# Patient Record
Sex: Male | Born: 1945 | Race: Black or African American | Hispanic: No | Marital: Married | Smoking: Former smoker
Health system: Southern US, Community
[De-identification: ages and names within clinical notes are randomized; demographics above are authoritative.]

## PROBLEM LIST (undated history)

## (undated) DIAGNOSIS — I2510 Atherosclerotic heart disease of native coronary artery without angina pectoris: Secondary | ICD-10-CM

## (undated) DIAGNOSIS — I10 Essential (primary) hypertension: Secondary | ICD-10-CM

## (undated) DIAGNOSIS — I719 Aortic aneurysm of unspecified site, without rupture: Secondary | ICD-10-CM

## (undated) DIAGNOSIS — E119 Type 2 diabetes mellitus without complications: Secondary | ICD-10-CM

## (undated) DIAGNOSIS — I219 Acute myocardial infarction, unspecified: Secondary | ICD-10-CM

---

## 2001-06-13 ENCOUNTER — Inpatient Hospital Stay (HOSPITAL_COMMUNITY): Admission: EM | Admit: 2001-06-13 | Discharge: 2001-06-15 | Payer: Self-pay | Admitting: Emergency Medicine

## 2001-06-13 ENCOUNTER — Encounter: Payer: Self-pay | Admitting: Cardiovascular Disease

## 2001-06-14 ENCOUNTER — Encounter: Payer: Self-pay | Admitting: Cardiovascular Disease

## 2001-06-15 ENCOUNTER — Encounter (INDEPENDENT_AMBULATORY_CARE_PROVIDER_SITE_OTHER): Payer: Self-pay | Admitting: Cardiovascular Disease

## 2001-06-16 ENCOUNTER — Ambulatory Visit (HOSPITAL_COMMUNITY): Admission: RE | Admit: 2001-06-16 | Discharge: 2001-06-16 | Payer: Self-pay | Admitting: Cardiovascular Disease

## 2006-05-31 ENCOUNTER — Inpatient Hospital Stay (HOSPITAL_BASED_OUTPATIENT_CLINIC_OR_DEPARTMENT_OTHER): Admission: RE | Admit: 2006-05-31 | Discharge: 2006-05-31 | Payer: Self-pay | Admitting: Cardiology

## 2010-05-26 NOTE — Cardiovascular Report (Signed)
Jesse Le, Jesse Le                 ACCOUNT NO.:  [id number]   MEDICAL RECORD NO.:  [id number]          PATIENT TYPE:  OIB   LOCATION:  1965                         FACILITY:  [facility]   PHYSICIAN:  Jesse Le, M.D. DATE OF BIRTH:  Nov 13, 1945   DATE OF PROCEDURE:  05/31/2006  DATE OF DISCHARGE:                            CARDIAC CATHETERIZATION   PROCEDURE:  Left cardiac catheterization with selective left and right  coronary angiography, left ventriculography via right groin using  Judkins technique.   INDICATIONS FOR THE PROCEDURE:  Jesse Le is a 65 year old black male  with past medical history significant for coronary artery disease,  hypertension, non-insulin-dependent diabetes mellitus,  hypercholesteremia, morbid obesity, tobacco abuse and positive family  history of coronary artery disease, who complains of retrosternal chest  pain associated with minimal exertion, relieved with rest, lasting 15-20  minutes.  Denies any nausea, vomiting or diaphoresis.  Denies shortness  of breath.  Denies palpitation, lightheadedness or syncope.  Denies PND,  orthopnea or leg swelling.  Denies rest or nocturnal angina and denies  exertional chest pain to food, breathing or movement.   PAST MEDICAL HISTORY:  As above.   PAST SURGICAL HISTORY:  None.   ALLERGIES:  NO KNOWN DRUG ALLERGIES.   MEDICATIONS AT HOME:  1. Toprol-XL 50 mg p.o. daily.  2. Lotrel 5/20 p.o. daily.  3. Enteric-coated aspirin 81 mg p.o. daily.  4. Actos 15 mg p.o. daily.  5. Nitrostat sublingual p.r.n.   SOCIAL HISTORY:  He is single, smoked 2 pack per day for 20+ years, now  smokes less than a half a pack per day, drinks beer socially.  He worked  for a moving company in the past, born and lives in [place].   FAMILY HISTORY:  Father died of cancer.  Mother died of heart problems;  she was hypertensive.   EXAMINATION:  GENERAL:  He is alert, awake and oriented x3 in no  distress.  VITAL SIGNS:  Blood  pressure was 160/104, pulse of 80.  HEENT:  Conjunctivae were pink.  NECK:  Supple.  No JVD, no bruit.  LUNGS:  Clear to auscultation without rhonchi or rales.  CARDIOVASCULAR:  S1 and S2 were normal.  There is a soft systolic murmur  and S3 gallop.  ABDOMEN:  Soft.  Bowel sounds are present.  Obese, nontender.  EXTREMITIES:  There is no clubbing, cyanosis or edema.   IMPRESSION:  1. New-onset angina.  2. Uncontrolled hypertension secondary to noncompliance.  3. Non-insulin-dependent diabetes mellitus.  4. Hypercholesteremia.  5. Morbid obesity.  6. Tobacco abuse.  7. Positive family history of coronary artery disease.   INFORMED CONSENT:  I discussed with the patient regarding noninvasive  stress testing versus left cath, its risks and benefits, i.e., death, MI  and stroke, need for emergency CABG, local vascular complications, etc,  and consented for the procedure.   PROCEDURE:  After obtaining informed consent, the patient was brought to  the cath lab and was placed on fluoroscopy table.  Right groin was  prepped and draped in the usual fashion.  2% Xylocaine was  used for  local anesthesia in the right groin.  With the help of a thin-wall  needle, a 4-French arterial sheath was placed.  Sheath was aspirated and  flushed.  Next, a 4-French left Judkins catheter was advanced over the  wire under fluoroscopic guidance up to the ascending aorta.  Wire was  pulled.  The catheter was aspirated and connected to the manifold.  The  catheter was further advanced and engaged into left coronary ostium.  Multiple views of the left system were taken.  Next, the catheter was  disengaged and was pulled out over the wire and was replaced with a 4-  [place] right Judkins catheter, which was advanced over the wire under  fluoroscopic guidance up to the ascending aorta.  The wire was pulled  out.  The catheter was aspirated and connected to the manifold.  Catheter was further advanced and engaged  into right coronary ostium.  Multiple views of the right system were taken.  Next, the catheter was  disengaged and was pulled out over the wire and replaced with a 4-French  pigtail catheter, which was advanced over the wire under fluoroscopic  guidance to the ascending aorta.  Wire was pulled out.  The catheter was  aspirated and connected to the manifold.  Catheter was further advanced  across aortic valve into the LV.  LV pressures were recorded.  Next,  left ventriculography was done in 30-degree RAO position.  Post  angiographic pressures were recorded from LV and then pullback pressures  were recorded from the aorta.  There was no gradient across the aortic  valve.  Next, a pigtail catheter was pulled out over the wire.  Sheaths  were aspirated and flushed.   FINDINGS:  LV showed good LV systolic function, LVH, EF of approximately  50%.  Left main was large, which was patent.  LAD had 10% to 15%  proximal and 30% to 40% mid stenosis.  Diagonal #1 to #3 were was small,  which was patent.  Ramus was moderate sized, which was patent.  Left  circumflex had 10% to 15% proximal stenosis.  OM-1 was very small, which  was patent.  OM-2 was small, which was patent.  RCA has 10% to 15%  proximal and mid stenosis.  PDA and PLV branches were small.  The  patient tolerated the procedure well.  There were no complications.  The  patient was transferred to recovery room in stable condition.           ______________________________  Jesse Le Sharyn Le, M.D.     MNH/MEDQ  D:  05/31/2006  T:  05/31/2006  Job:  [id number]   cc:   [facility]

## 2010-05-29 NOTE — Cardiovascular Report (Signed)
[place]  Patient:    Jesse Le, ALESI Visit Number: [id number] MRN: [id number]          Service Type: CAT Location: [facility] 2899 30 Attending Physician:  Ricki Rodriguez Dictated by:   Ricki Rodriguez, M.D. Proc. Date: 06/16/01 Admit Date:  06/16/2001 Discharge Date: 06/16/2001   CC:         Cath Lab   Cardiac Catheterization  PROCEDURE: 1. Left heart catheterization. 2. Selective coronary angiography. 3. Left ventriculogram study.  INDICATION:  This is a 65 year old black male with diabetes, hypertension, and obesity, had recurrent chest pain with minimal abnormality on nuclear stress test at the base of the heart.  APPROACH:  The right femoral artery using 6 French diagnostic catheters.  JL5 was required for left coronary injection.  COMPLICATIONS:  None.  HEMODYNAMIC DATA:  The left ventricular pressure was 176/16, and the aortic pressure was 176/101.  Originally, the aortic pressure was 204/119, 2.5 mg of Vasotec, 5 mg of Lopressor, and 20 mg of labetalol IV x 2 were used to control the blood pressure.  CORONARY ANATOMY:  Left main coronary artery:  The left main coronary artery was short and ectatic.  Left anterior descending coronary artery:  The left anterior descending coronary artery was also ectatic and had minimal proximal disease.  Left circumflex coronary artery:  The left circumflex coronary artery was unremarkable with a large ramus branch.  Right coronary artery:  The right coronary artery was also ectatic and had minimal proximal disease.  Left ventriculogram:  The left ventriculogram showed marginal hypokinesia with ejection fraction of 50-55%.  Descending aortography:  The descending aortography was essentially unremarkable with large renal arteries.  IMPRESSION: 1. Mild coronary artery disease ______, two-vessel disease. 2. Hypertension. 3. Mild left ventricular systolic dysfunction.  RECOMMENDATION:   This patient will be treated medically with emphasis on hypertension, diabetes, and weight control. Dictated by:   Ricki Rodriguez, M.D. Attending Physician:  Ricki Rodriguez DD:  06/16/01 TD:  06/19/01 Job: [id number]

## 2010-05-29 NOTE — Discharge Summary (Signed)
Jesse Le, Jesse Le                             ACCOUNT NO.:  [id number]   MEDICAL RECORD NO.:  [id number]                   PATIENT TYPE:  INP   LOCATION:  3712                                 FACILITY:  [facility]   PHYSICIAN:  Ricki Rodriguez, M.D.               DATE OF BIRTH:  Oct 26, 1945   DATE OF ADMISSION:  06/13/2001  DATE OF DISCHARGE:  06/15/2001                                 DISCHARGE SUMMARY   PRINCIPAL DIAGNOSES:  1. Angina, nonspecific.  2. Diabetes mellitus, type 2.  3. Hypertension.  4. Tobacco abuse disorder.  5. Anxiety.  6. Reflux esophagitis.  7. Obesity.   DISCHARGE MEDICATIONS:  1. Aspirin 81 mg one p.o. daily.  2. Actos 15 mg one daily.  3. Clonidine 0.2 mg three times daily.  4. Norvasc 10 mg one daily.  5. Pepcid AC twice daily.  6. Vicodin 5/500 mg one four times daily as needed.  7. Amoxicillin 500 mg three times daily.  8. Nitroglycerin 0.4 mg one under the tongue every five minutes x3 as needed     for chest pain.  9. Xanax 0.5 mg one twice daily.   PAIN MANAGEMENT:  Patient to use Vicodin as needed.   DISCHARGE ACTIVITY:  As tolerated.   DISCHARGE DIET:  An 1800-calorie, diabetic, low-fat, low-salt diet.   FOLLOWUP:  Followup by Dr. Ricki Rodriguez in two weeks; patient to call [phone]  [phone].   HISTORY:  This 65 year old black male complained of substernal burning chest  pain along with some pain in the abdomen, positive sweating and hypertensive  on arrival, with a history of smoking, alcohol intake and family history of  premature coronary artery disease.   PHYSICAL EXAMINATION:  Temperature 96.8, pulse 66, respirations 18, blood  pressure 74/49 on arrival, subsequent blood pressure 133/66.  Height 6 feet  1 inch and weight 267 pounds.  The patient was alert and oriented x3.  HEENT:  Head normocephalic, atraumatic.  Eyes brown.  Extraocular movement  intact.  Conjunctivae pink.  Ears, nose and throat:  Oral mucous membranes  pink and moist.   NECK:  No JVD.  LUNGS:  Lungs clear bilaterally.  HEART:  Normal S1 and S2.  ABDOMEN:  Abdomen soft with epigastric tenderness.  EXTREMITIES:  No edema.  CNS:  Cranial nerves II-XII grossly intact.   LABORATORY DATA:  Normal electrolytes, BUN and creatinine.  Sugar elevated  at 212.  Normal liver enzymes.  Normal CK-MB and troponin I.  Hemoglobin was  15.2, hematocrit was 43.6, normal WBC count, slightly low platelet count of  119,000.   X-ray of abdomen:  Nonobstructive bowel gas pattern.   X-ray of the chest:  Cardiomegaly, otherwise, unremarkable.   EKG showed sinus rhythm with left ventricular enlargement and nonspecific T  wave changes.   HOSPITAL COURSE:  The patient was admitted to telemetry unit.  Myocardial  infarction  was ruled out.  He received IV Protonix, Phenergan and clear  liquids.  His condition stabilized in 24 hours.  He underwent nuclear stress  test that failed to show any reversible ischemia.  He was watched an  additional 24 hours.  His Altace was discontinued due to symptoms of cough  and he got a dietary consult.  On June 15, 2001, he was discharged home in  satisfactory condition with followup by me in two weeks.                                               Ricki Rodriguez, M.D.    ASK/MEDQ  D:  10/07/2001  T:  10/10/2001  Job:  [id number]

## 2011-10-09 ENCOUNTER — Inpatient Hospital Stay (HOSPITAL_COMMUNITY)
Admission: EM | Admit: 2011-10-09 | Discharge: 2011-10-21 | DRG: 219 | Disposition: A | Discharge status: Home / Self Care | Payer: Medicare Other | Attending: Cardiothoracic Surgery | Admitting: Cardiothoracic Surgery

## 2011-10-09 ENCOUNTER — Encounter (HOSPITAL_COMMUNITY): Payer: Self-pay | Admitting: *Deleted

## 2011-10-09 ENCOUNTER — Emergency Department (HOSPITAL_COMMUNITY): Payer: Medicare Other

## 2011-10-09 DIAGNOSIS — D62 Acute posthemorrhagic anemia: Secondary | ICD-10-CM | POA: Diagnosis present

## 2011-10-09 DIAGNOSIS — R042 Hemoptysis: Secondary | ICD-10-CM

## 2011-10-09 DIAGNOSIS — J449 Chronic obstructive pulmonary disease, unspecified: Secondary | ICD-10-CM | POA: Diagnosis present

## 2011-10-09 DIAGNOSIS — I2510 Atherosclerotic heart disease of native coronary artery without angina pectoris: Secondary | ICD-10-CM | POA: Diagnosis present

## 2011-10-09 DIAGNOSIS — E871 Hypo-osmolality and hyponatremia: Secondary | ICD-10-CM | POA: Diagnosis present

## 2011-10-09 DIAGNOSIS — Z23 Encounter for immunization: Secondary | ICD-10-CM

## 2011-10-09 DIAGNOSIS — I716 Thoracoabdominal aortic aneurysm, without rupture: Secondary | ICD-10-CM

## 2011-10-09 DIAGNOSIS — N289 Disorder of kidney and ureter, unspecified: Secondary | ICD-10-CM | POA: Diagnosis not present

## 2011-10-09 DIAGNOSIS — Z66 Do not resuscitate: Secondary | ICD-10-CM | POA: Diagnosis not present

## 2011-10-09 DIAGNOSIS — I7101 Dissection of thoracic aorta: Secondary | ICD-10-CM

## 2011-10-09 DIAGNOSIS — I219 Acute myocardial infarction, unspecified: Secondary | ICD-10-CM

## 2011-10-09 DIAGNOSIS — I10 Essential (primary) hypertension: Secondary | ICD-10-CM | POA: Diagnosis present

## 2011-10-09 DIAGNOSIS — N2889 Other specified disorders of kidney and ureter: Secondary | ICD-10-CM | POA: Diagnosis not present

## 2011-10-09 DIAGNOSIS — D72829 Elevated white blood cell count, unspecified: Secondary | ICD-10-CM | POA: Diagnosis not present

## 2011-10-09 DIAGNOSIS — J4489 Other specified chronic obstructive pulmonary disease: Secondary | ICD-10-CM | POA: Diagnosis present

## 2011-10-09 DIAGNOSIS — F17200 Nicotine dependence, unspecified, uncomplicated: Secondary | ICD-10-CM | POA: Diagnosis present

## 2011-10-09 DIAGNOSIS — J860 Pyothorax with fistula: Secondary | ICD-10-CM | POA: Diagnosis present

## 2011-10-09 DIAGNOSIS — I498 Other specified cardiac arrhythmias: Secondary | ICD-10-CM | POA: Diagnosis not present

## 2011-10-09 DIAGNOSIS — I7103 Dissection of thoracoabdominal aorta: Principal | ICD-10-CM | POA: Diagnosis present

## 2011-10-09 DIAGNOSIS — I252 Old myocardial infarction: Secondary | ICD-10-CM

## 2011-10-09 HISTORY — DX: Acute myocardial infarction, unspecified: I21.9

## 2011-10-09 HISTORY — DX: Atherosclerotic heart disease of native coronary artery without angina pectoris: I25.10

## 2011-10-09 HISTORY — DX: Essential (primary) hypertension: I10

## 2011-10-09 LAB — COMPREHENSIVE METABOLIC PANEL
ALT: 9 U/L (ref 0–53)
AST: 17 U/L (ref 0–37)
Albumin: 2.6 g/dL — ABNORMAL LOW (ref 3.5–5.2)
Alkaline Phosphatase: 54 U/L (ref 39–117)
BUN: 6 mg/dL (ref 6–23)
CO2: 29 mEq/L (ref 19–32)
Calcium: 9.1 mg/dL (ref 8.4–10.5)
Chloride: 93 mEq/L — ABNORMAL LOW (ref 96–112)
Creatinine, Ser: 0.9 mg/dL (ref 0.50–1.35)
GFR calc Af Amer: >90 mL/min (ref >90)
GFR calc non Af Amer: 87 mL/min — ABNORMAL LOW (ref >90)
Glucose, Bld: 84 mg/dL (ref 70–99)
Potassium: 4 mEq/L (ref 3.5–5.1)
Sodium: 132 mEq/L — ABNORMAL LOW (ref 135–145)
Total Bilirubin: 0.7 mg/dL (ref 0.3–1.2)
Total Protein: 8.4 g/dL — ABNORMAL HIGH (ref 6.0–8.3)

## 2011-10-09 LAB — TYPE AND SCREEN
ABO/RH(D): B POS
Antibody Screen: NEGATIVE

## 2011-10-09 LAB — PROTIME-INR
INR: 1.31 (ref 0.00–1.49)
Prothrombin Time: 16 seconds — ABNORMAL HIGH (ref 11.6–15.2)

## 2011-10-09 LAB — CBC WITH DIFFERENTIAL/PLATELET
Basophils Absolute: 0 10*3/uL (ref 0.0–0.1)
Eosinophils Absolute: 0.1 10*3/uL (ref 0.0–0.7)
Eosinophils Relative: 2 % (ref 0–5)
HCT: 34.3 % — ABNORMAL LOW (ref 39.0–52.0)
Lymphocytes Relative: 20 % (ref 12–46)
MCH: 26.6 pg (ref 26.0–34.0)
MCV: 82.3 fL (ref 78.0–100.0)
Monocytes Absolute: 0.5 10*3/uL (ref 0.1–1.0)
RDW: 14.5 % (ref 11.5–15.5)
WBC: 7.5 10*3/uL (ref 4.0–10.5)

## 2011-10-09 LAB — CBC
HCT: 35 % — ABNORMAL LOW (ref 39.0–52.0)
Hemoglobin: 11.1 g/dL — ABNORMAL LOW (ref 13.0–17.0)
MCH: 26.4 pg (ref 26.0–34.0)
MCHC: 31.7 g/dL (ref 30.0–36.0)
MCV: 83.1 fL (ref 78.0–100.0)
Platelets: 251 10*3/uL (ref 150–400)
RBC: 4.21 MIL/uL — ABNORMAL LOW (ref 4.22–5.81)
RDW: 14.6 % (ref 11.5–15.5)
WBC: 9.7 10*3/uL (ref 4.0–10.5)

## 2011-10-09 LAB — BASIC METABOLIC PANEL
BUN: 5 mg/dL — ABNORMAL LOW (ref 6–23)
CO2: 27 mEq/L (ref 19–32)
Chloride: 95 mEq/L — ABNORMAL LOW (ref 96–112)
Creatinine, Ser: 0.88 mg/dL (ref 0.50–1.35)
Glucose, Bld: 101 mg/dL — ABNORMAL HIGH (ref 70–99)

## 2011-10-09 LAB — ABO/RH: ABO/RH(D): B POS

## 2011-10-09 MED ORDER — SODIUM CHLORIDE 0.9 % IJ SOLN
3.0000 mL | Freq: Two times a day (BID) | INTRAMUSCULAR | Status: DC
  Administered 2011-10-09 – 2011-10-21 (×20): 3 mL via INTRAVENOUS

## 2011-10-09 MED ORDER — LABETALOL HCL 5 MG/ML IV SOLN: 10.0000 mg | INTRAVENOUS | Status: DC | PRN

## 2011-10-09 MED ORDER — ONDANSETRON HCL 4 MG/2ML IJ SOLN: 4.0000 mg | Freq: Four times a day (QID) | INTRAMUSCULAR | Status: DC | PRN

## 2011-10-09 MED ORDER — BUDESONIDE-FORMOTEROL FUMARATE 160-4.5 MCG/ACT IN AERO
2.0000 | INHALATION_SPRAY | Freq: Two times a day (BID) | RESPIRATORY_TRACT | Status: DC
  Administered 2011-10-11 – 2011-10-21 (×19): 2 via RESPIRATORY_TRACT
  Filled 2011-10-09 (×4): qty 6

## 2011-10-09 MED ORDER — NITROGLYCERIN 0.4 MG/HR TD PT24
0.4000 mg | MEDICATED_PATCH | Freq: Every day | TRANSDERMAL | Status: DC
  Administered 2011-10-09 – 2011-10-21 (×13): 0.4 mg via TRANSDERMAL
  Filled 2011-10-09 (×13): qty 1

## 2011-10-09 MED ORDER — DOCUSATE SODIUM 100 MG PO CAPS
100.0000 mg | ORAL_CAPSULE | Freq: Two times a day (BID) | ORAL | Status: DC
  Administered 2011-10-09 – 2011-10-21 (×23): 100 mg via ORAL
  Filled 2011-10-09 (×23): qty 1

## 2011-10-09 MED ORDER — HYDROCODONE-ACETAMINOPHEN 5-325 MG PO TABS
1.0000 | ORAL_TABLET | ORAL | Status: DC | PRN
  Administered 2011-10-14 – 2011-10-17 (×2): 2 via ORAL
  Administered 2011-10-18 (×2): 1 via ORAL
  Administered 2011-10-19: 2 via ORAL
  Administered 2011-10-19 – 2011-10-20 (×3): 1 via ORAL
  Filled 2011-10-09 (×2): qty 2
  Filled 2011-10-09 (×5): qty 1
  Filled 2011-10-09: qty 2

## 2011-10-09 MED ORDER — SODIUM CHLORIDE 0.9 % IV SOLN
INTRAVENOUS | Status: DC
  Administered 2011-10-09: 10:00:00 via INTRAVENOUS

## 2011-10-09 MED ORDER — IOHEXOL 300 MG/ML  SOLN
80.0000 mL | Freq: Once | INTRAMUSCULAR | Status: AC | PRN
  Administered 2011-10-09: 80 mL via INTRAVENOUS

## 2011-10-09 MED ORDER — LISINOPRIL 40 MG PO TABS
40.0000 mg | ORAL_TABLET | Freq: Two times a day (BID) | ORAL | Status: DC
  Administered 2011-10-09 – 2011-10-13 (×9): 40 mg via ORAL
  Filled 2011-10-09 (×11): qty 2

## 2011-10-09 MED ORDER — METOPROLOL TARTRATE 25 MG PO TABS
50.0000 mg | ORAL_TABLET | Freq: Two times a day (BID) | ORAL | Status: DC
  Administered 2011-10-09 – 2011-10-11 (×4): 50 mg via ORAL
  Filled 2011-10-09 (×5): qty 2

## 2011-10-09 MED ORDER — METOPROLOL TARTRATE 1 MG/ML IV SOLN
5.0000 mg | Freq: Once | INTRAVENOUS | Status: AC
  Administered 2011-10-09: 5 mg via INTRAVENOUS
  Filled 2011-10-09: qty 5

## 2011-10-09 MED ORDER — HYDROCOD POLST-CHLORPHEN POLST 10-8 MG/5ML PO LQCR
5.0000 mL | Freq: Two times a day (BID) | ORAL | Status: DC
  Administered 2011-10-09 – 2011-10-16 (×13): 5 mL via ORAL
  Filled 2011-10-09 (×17): qty 5

## 2011-10-09 MED ORDER — KCL IN DEXTROSE-NACL 20-5-0.45 MEQ/L-%-% IV SOLN
INTRAVENOUS | Status: DC
  Administered 2011-10-09 – 2011-10-11 (×2): via INTRAVENOUS
  Administered 2011-10-13: 30 mL/h via INTRAVENOUS
  Filled 2011-10-09 (×7): qty 1000

## 2011-10-09 MED ORDER — SIMVASTATIN 20 MG PO TABS
20.0000 mg | ORAL_TABLET | Freq: Every evening | ORAL | Status: DC
  Administered 2011-10-09 – 2011-10-20 (×11): 20 mg via ORAL
  Filled 2011-10-09 (×13): qty 1

## 2011-10-09 MED ORDER — NITROGLYCERIN 0.4 MG/HR TD PT24
0.4000 mg | MEDICATED_PATCH | Freq: Every day | TRANSDERMAL | Status: DC
  Filled 2011-10-09: qty 1

## 2011-10-09 MED ORDER — ONDANSETRON HCL 4 MG PO TABS: 4.0000 mg | ORAL_TABLET | Freq: Four times a day (QID) | ORAL | Status: DC | PRN

## 2011-10-09 NOTE — Consult Note (Signed)
[address]            [place] [address]          [phone]       Brayland Nelles [facility] Medical Record #[id number] Date of Birth: [birth year]  Referring: No ref. provider found Primary Care: Robynn Pane, MD  Chief Complaint:    Chief Complaint  Patient presents with  . Hemoptysis    History of Present Illness:     66 year old Afro-American male smoker with a history of alcohol abuse presents with a one-day history of dark blood hemoptysis. No history of trauma. No history of chest pain. Patient is a heavy smoker. No prior history of hemoptysis. No history of bleeding disorder blood per rectum or upper GI bleeding. Chest x-ray done in the ED demonstrates a large left thoracic mass. 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 This appears to be a type B dissection with a false lumen enlarged up to 11 cm but mainly thrombosed. There is some extrinsic distortion of the esophagus.  The diameter of the aorta at the diaphragmatic hiatus is 5.8 cm.  The patient denies any specific events of severe chest pain. He denies shortness of breath.   The hemoptysis is dark blood and has been mild. Current Activity/ Functional Status: Sedentary, disabled drinks hard liquor regularly   Past Medical History  Diagnosis Date  . Coronary artery disease   . Hypertension   . Myocardial infarct     History reviewed. No pertinent past surgical history.  History  Smoking status  . Current Every Day Smoker -- 0.5 packs/day  . Types: Cigarettes  Smokeless tobacco  . Not on file    History  Alcohol Use  . Yes    weekends    History   Social History  . Marital Status: Married    Spouse Name: N/A    Number of Children: N/A  . Years of Education: N/A   Occupational History  . Not on file.   Social History Main Topics  . Smoking status:  Current Every Day Smoker -- 0.5 packs/day    Types: Cigarettes  . Smokeless tobacco: Not on file  . Alcohol Use: Yes     weekends  . Drug Use: No  . Sexually Active:    Other Topics Concern  . Not on file   Social History Narrative  . No narrative on file    No Known Allergies  Current Facility-Administered Medications  Medication Dose Route Frequency Provider Last Rate Last Dose  . chlorpheniramine-HYDROcodone (TUSSIONEX) 10-8 MG/5ML suspension 5 mL  5 mL Oral BID Kerin Perna, MD      . dextrose 5 % and 0.45 % NaCl with KCl 20 mEq/L infusion   Intravenous Continuous Ricki Rodriguez, MD      . docusate sodium (COLACE) capsule 100 mg  100 mg Oral BID Ricki Rodriguez, MD      . HYDROcodone-acetaminophen (NORCO/VICODIN) 5-325 MG per tablet 1-2 tablet  1-2 tablet Oral Q4H PRN Ricki Rodriguez, MD      . iohexol (OMNIPAQUE) 300 MG/ML solution 80 mL  80 mL Intravenous Once PRN Medication Radiologist, MD   80 mL at 10/09/11 1550  . labetalol (NORMODYNE,TRANDATE) injection 10 mg  10 mg Intravenous Q2H PRN Kerin Perna, MD      .  lisinopril (PRINIVIL,ZESTRIL) tablet 40 mg  40 mg Oral BID Ricki Rodriguez, MD   40 mg at 10/09/11 1833  . metoprolol (LOPRESSOR) injection 5 mg  5 mg Intravenous Once Ricki Rodriguez, MD   5 mg at 10/09/11 1730  . metoprolol tartrate (LOPRESSOR) tablet 50 mg  50 mg Oral BID Ricki Rodriguez, MD      . nitroGLYCERIN (NITRODUR - Dosed in mg/24 hr) patch 0.4 mg  0.4 mg Transdermal Daily Ricki Rodriguez, MD      . ondansetron (ZOFRAN) tablet 4 mg  4 mg Oral Q6H PRN Ricki Rodriguez, MD       Or  . ondansetron (ZOFRAN) injection 4 mg  4 mg Intravenous Q6H PRN Ricki Rodriguez, MD      . simvastatin (ZOCOR) tablet 20 mg  20 mg Oral QPM Ricki Rodriguez, MD      . sodium chloride 0.9 % injection 3 mL  3 mL Intravenous Q12H Ricki Rodriguez, MD      . DISCONTD: 0.9 %  sodium chloride infusion   Intravenous Continuous Ricki Rodriguez, MD 125 mL/hr at 10/09/11 1013    . DISCONTD:  nitroGLYCERIN (NITRODUR - Dosed in mg/24 hr) patch 0.4 mg  0.4 mg Transdermal Daily Ricki Rodriguez, MD        Prescriptions prior to admission  Medication Sig Dispense Refill  . aspirin EC 81 MG tablet Take 81 mg by mouth daily.      Marland Kitchen atenolol (TENORMIN) 50 MG tablet Take 50 mg by mouth 2 (two) times daily.      Marland Kitchen lisinopril (PRINIVIL,ZESTRIL) 40 MG tablet Take 40 mg by mouth 2 (two) times daily.      . simvastatin (ZOCOR) 20 MG tablet Take 20 mg by mouth every evening.        No family history on file.   Review of Systems:     Cardiac Review of Systems: Y or N  Chest Pain Milo.Brash    ]  Resting SOB [ n  ] Exertional SOB  [ n ]  Orthopnea [ n ]   Pedal Edema [   ]    Palpitations [  ] Syncope  [  ]   Presyncope [  n ]  General Review of Systems: [Y] = yes [  ]=no Constitional: recent weight change Cove.Etienne  ]; anorexia [  ]; fatigue [  ]; nausea [  ]; night sweats [  ]; fever [n  ]; or chills [  ];                                                                                                                                          Dental: poor dentition[  ]; Last Dentist visit:1 yr  Eye : blurred vision [  ]; diplopia [   ]; vision changes [  ];  Amaurosis fugax[  ]; Resp: cough [  ];  wheezing[ n ];  hemoptysis[ y ]; shortness of breath[  ]; paroxysmal nocturnal dyspnea[  ]; dyspnea on exertion[  ]; or orthopnea[  ];  GI:  gallstones[  ], vomitingn[  ];  dysphagia[  ]; melena[  ];  hematochezia [  ]; heartburn[  ];   Hx of  Colonoscopy[  ]; GU: kidney stones [  ]; hematuria[  ];   dysuria [  ];  nocturia[  ];  history of     obstruction [  ];             Skin: rash, swelling[  ];, hair loss[  ];  peripheral edema[  ];  or itching[  ]; Musculosketetal: myalgias[  ];  joint swelling[  ];  joint erythema[  ];  joint pain[  ];  back pain[  ];  Heme/Lymph: bruising[  ];  bleeding[  ];  anemia[  ];  Neuro: TIA[  ];  headaches[  ];  stroke[  ];  vertigo[  ];  seizures[  ];   paresthesias[  ];   difficulty walking[  ];  Psych:depression[  ]; anxiety[  ];  Endocrine: diabetes[  ];  thyroid dysfunction[  ];  Immunizations: Flu [  ]; Pneumococcal[  ];  Other:  Physical Exam: BP 146/111  Pulse 90  Temp 99.1 F (37.3 C) (Oral)  Resp 24  SpO2 97% Gen. 66 year old black male appears older than his stated age appearance somewhat disheveled with strong tobacco scent HEENT normocephalic muddy sclera dentition poor Neck no mass palpable pulses no JVD Thorax scattered rhonchi no deformity or tenderness Cardiac regular rhythm no murmur no gallop Abdomen obese probable ascites no palpable mass or pulsatile mass  Extremities-mild clubbing no edema or tenderness Vascular palpable pulses in all extremities Neuro no focal motor deficit oriented  Diagnostic Studies & Laboratory data:   CT scan of the chest shows a large thoracoabdominal aneurysm. No clear communication between the aneurysm the left lower lobe bronchus  Recent Radiology Findings:   Dg Chest 2 View  10/09/2011  *RADIOLOGY REPORT*  Clinical Data: Hemoptysis.  CHEST - 2 VIEW  Comparison: No priors.  Findings: There is a huge mass like opacity filling the majority of the left hemithorax, which appears to be centered in the left lower lobe, which causes significant upward bowing of the left major fissure.  This is highly concerning for neoplasm.  Lungs are otherwise clear.  Heart size is normal.  No evidence of pulmonary edema.  IMPRESSION: 1.  Large mass-like opacity centered in the left lower lobe, as above.  Further evaluation with contrast enhanced chest CT is highly recommended that this time, as this finding is likely to represent a malignant neoplasm.   Original Report Authenticated By: Florencia Reasons, M.D.    Ct Chest W Contrast  10/09/2011  *RADIOLOGY REPORT*  Clinical Data:  Hemoptysis, possible malignancy  CT CHEST WITH CONTRAST  Technique:  Multidetector CT imaging of the chest was performed following the standard  protocol during bolus administration of intravenous contrast.  Contrast: 80mL OMNIPAQUE IOHEXOL 300 MG/ML  SOLN  Comparison: Chest x-ray obtained earlier today, 10/09/2011 at 12:00  Findings:  Mediastinum: 1 cm nodule along the posterior inferior aspect of the left thyroid gland is nonspecific by CT.  The remainder the thoracic inlet is unremarkable.  No suspicious mediastinal or hilar adenopathy. The thoracic esophagus is partially compressed by the mass effect.  There is retained  fluid/debris in the upper thoracic esophagus.  Heart/Vascular: Ectasia of the ascending aorta which measures up to 4.1 cm in diameter. Heart is within normal limits for size.  The left atrium and left pulmonary veins are displaced anteriorly and rightward by the large descending thoracic aortic aneurysmal dissection.  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  The dissection flap begins in the proximal descending thoracic aorta.  A large fenestration with the partially thrombosed false lumen can be seen on image 26 of series 2.  The true lumen is compressed but patent.  The false lumen is almost entirely thrombosed there is a small amount of contrast material which extends inferiorly to the level of the renal artery. The left renal artery arises from the false lumen.  Celiac axis and SMA and right renal artery arise from the true lumen although the dissection flap does extend into the proximal celiac and SMA but does not appear flow limiting.  The descending thoracic aorta measures up to 12 x 11 cm in greatest dimension at the level of the pulmonic trunk.  The aorta measures 5.8 x 5.3 cm at the aortic hiatus and 5.9 x 4.7 cm of the SMA origin. The aorta exerts mass effect on the heart, the esophagus and left mainstem and left lower lobe bronchus.  There is associated atelectasis of the overlying left lower lobe and a small left pleural effusion which appears simple, and not  hemorrhagic.  Lungs/Pleura: Minimal paraseptal emphysema in the bilateral apices. Compressive atelectasis in the left lower lobe.  Small radiographically simple left pleural effusion.  Negative for edema, focal airspace consolidation, pulmonary nodule or mass.  The left lower lobe bronchi are compressed by mass effect.  Upper Abdomen: Aneurysmally dilated aortic dissection as detailed above.  A 5.3 cm fluid attenuation cystic lesion exophytic from the upper pole of the left kidney likely represents a simple cyst. Incompletely imaged 1.3 cm exophytic from the interpolar right kidney may be solid. Cholelithiasis without evidence of cholecystitis.  Bones: No acute fracture or aggressive appearing lytic or blastic osseous lesion.  IMPRESSION:  1.  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  The descending thoracic aorta measures up to 12 x 11 cm in diameter and exerts mass effect on the the heart, esophagus, left mainstem bronchus and left lower lobe bronchi.  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  Recommend CT of the abdomen and pelvis to further evaluate the inferior extent of the aneurysmal dissection as well as urgent consultation with cardiovascular surgery.  2.  Retained fluid/debris in the upper thoracic esophagus likely secondary to mass effect from the aneurysmally dilated chronic aortic dissection.  3.  Ectasia of the ascending thoracic aorta to 4.1 cm.  4.  Incompletely imaged, potentially solid lesion exophytic from the interpolar right kidney warrants further evaluation to exclude renal cell carcinoma. This can be further evaluated on the CTA of the abdomen and pelvis.  5.  Left lower lobe atelectasis and small, simple left pleural effusion.  6.  Nonspecific 1 cm nodule in the posterior, inferior left thyroid  lobe.  If clinically warranted, thyroid ultrasound could further evaluate.  Critical Value/emergent results were called by telephone at the time of interpretation on 10/09/2011 at 04:20 p.m. to Dr. Weldon Inches, who verbally acknowledged these results.  Signed,  Vilma Prader  K. 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 The patient is a very poor candidate for open resection grafting. I doubt that he would be a candidate for stent graft due to the well formed and mainly thrombosed false lumen compressing the true lumen. We'll proceed with pulmonary evaluation and echocardiogram. We'll not plan bronchoscopy as at may aggravate the hemoptysis. We'll check sputum cytologies.     @me1 @ 10/09/2011 6:51 PM

## 2011-10-09 NOTE — ED Notes (Signed)
C/o hemoptysis started this am, has been coughing for few days. Denies SOB, resp e/u, no distress. EMS reports approx 10ml bloody secretions

## 2011-10-09 NOTE — H&P (Signed)
Jesse Le is an 66 y.o. male.   Chief Complaint: Aortic aneurysm with hemoptysis HPI: 66 years old male has one day history of coughing up blood. No chest pain but CT of chest showed large descending aneurysm with mass effect on left lower lobe bronchus and lower esophagus.   Past Medical History  Diagnosis Date  . Coronary artery disease   . Hypertension   . Myocardial infarct       History reviewed. No pertinent past surgical history.  No family history on file. Social History:  reports that he has been smoking Cigarettes.  He has been smoking about .5 packs per day. He does not have any smokeless tobacco history on file. He reports that he drinks alcohol. He reports that he does not use illicit drugs.  Allergies: No Known Allergies   (Not in a hospital admission)  Results for orders placed during the hospital encounter of 10/09/11 (from the past 48 hour(s))  BASIC METABOLIC PANEL     Status: Abnormal   Collection Time   10/09/11 10:13 AM      Component Value Range Comment   Sodium 134 (*) 135 - 145 mEq/L    Potassium 3.3 (*) 3.5 - 5.1 mEq/L    Chloride 95 (*) 96 - 112 mEq/L    CO2 27  19 - 32 mEq/L    Glucose, Bld 101 (*) 70 - 99 mg/dL    BUN 5 (*) 6 - 23 mg/dL    Creatinine, Ser 3.08  0.50 - 1.35 mg/dL    Calcium 8.9  8.4 - 65.7 mg/dL    GFR calc non Af Amer 88 (*) >90 mL/min    GFR calc Af Amer >90  >90 mL/min   CBC WITH DIFFERENTIAL     Status: Abnormal   Collection Time   10/09/11 10:13 AM      Component Value Range Comment   WBC 7.5  4.0 - 10.5 K/uL    RBC 4.17 (*) 4.22 - 5.81 MIL/uL    Hemoglobin 11.1 (*) 13.0 - 17.0 g/dL    HCT 84.6 (*) 96.2 - 52.0 %    MCV 82.3  78.0 - 100.0 fL    MCH 26.6  26.0 - 34.0 pg    MCHC 32.4  30.0 - 36.0 g/dL    RDW 95.2  84.1 - 32.4 %    Platelets 246  150 - 400 K/uL    Neutrophils Relative 71  43 - 77 %    Neutro Abs 5.3  1.7 - 7.7 K/uL    Lymphocytes Relative 20  12 - 46 %    Lymphs Abs 1.5  0.7 - 4.0 K/uL    Monocytes  Relative 7  3 - 12 %    Monocytes Absolute 0.5  0.1 - 1.0 K/uL    Eosinophils Relative 2  0 - 5 %    Eosinophils Absolute 0.1  0.0 - 0.7 K/uL    Basophils Relative 0  0 - 1 %    Basophils Absolute 0.0  0.0 - 0.1 K/uL    Dg Chest 2 View  10/09/2011  *RADIOLOGY REPORT*  Clinical Data: Hemoptysis.  CHEST - 2 VIEW  Comparison: No priors.  Findings: There is a huge mass like opacity filling the majority of the left hemithorax, which appears to be centered in the left lower lobe, which causes significant upward bowing of the left major fissure.  This is highly concerning for neoplasm.  Lungs are otherwise clear.  Heart  size is normal.  No evidence of pulmonary edema.  IMPRESSION: 1.  Large mass-like opacity centered in the left lower lobe, as above.  Further evaluation with contrast enhanced chest CT is highly recommended that this time, as this finding is likely to represent a malignant neoplasm.   Original Report Authenticated By: Florencia Reasons, M.D.    Ct Chest W Contrast  10/09/2011  *RADIOLOGY REPORT*  Clinical Data:  Hemoptysis, possible malignancy  CT CHEST WITH CONTRAST  Technique:  Multidetector CT imaging of the chest was performed following the standard protocol during bolus administration of intravenous contrast.  Contrast: 80mL OMNIPAQUE IOHEXOL 300 MG/ML  SOLN  Comparison: Chest x-ray obtained earlier today, 10/09/2011 at 12:00  Findings:  Mediastinum: 1 cm nodule along the posterior inferior aspect of the left thyroid gland is nonspecific by CT.  The remainder the thoracic inlet is unremarkable.  No suspicious mediastinal or hilar adenopathy. The thoracic esophagus is partially compressed by the mass effect.  There is retained fluid/debris in the upper thoracic esophagus.  Heart/Vascular: Ectasia of the ascending aorta which measures up to 4.1 cm in diameter. Heart is within normal limits for size.  The left atrium and left pulmonary veins are displaced anteriorly and rightward by the large  descending thoracic aortic aneurysmal dissection.  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  The dissection flap begins in the proximal descending thoracic aorta.  A large fenestration with the partially thrombosed false lumen can be seen on image 26 of series 2.  The true lumen is compressed but patent.  The false lumen is almost entirely thrombosed there is a small amount of contrast material which extends inferiorly to the level of the renal artery. The left renal artery arises from the false lumen.  Celiac axis and SMA and right renal artery arise from the true lumen although the dissection flap does extend into the proximal celiac and SMA but does not appear flow limiting.  The descending thoracic aorta measures up to 12 x 11 cm in greatest dimension at the level of the pulmonic trunk.  The aorta measures 5.8 x 5.3 cm at the aortic hiatus and 5.9 x 4.7 cm of the SMA origin. The aorta exerts mass effect on the heart, the esophagus and left mainstem and left lower lobe bronchus.  There is associated atelectasis of the overlying left lower lobe and a small left pleural effusion which appears simple, and not hemorrhagic.  Lungs/Pleura: Minimal paraseptal emphysema in the bilateral apices. Compressive atelectasis in the left lower lobe.  Small radiographically simple left pleural effusion.  Negative for edema, focal airspace consolidation, pulmonary nodule or mass.  The left lower lobe bronchi are compressed by mass effect.  Upper Abdomen: Aneurysmally dilated aortic dissection as detailed above.  A 5.3 cm fluid attenuation cystic lesion exophytic from the upper pole of the left kidney likely represents a simple cyst. Incompletely imaged 1.3 cm exophytic from the interpolar right kidney may be solid. Cholelithiasis without evidence of cholecystitis.  Bones: No acute fracture or aggressive appearing lytic or blastic osseous lesion.  IMPRESSION:  1.  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  The descending thoracic aorta measures up to 12 x 11 cm in diameter and exerts mass effect on the the heart, esophagus, left mainstem bronchus and left lower lobe bronchi.  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  Recommend CT of the abdomen and pelvis to further evaluate the inferior extent of the aneurysmal dissection as well as urgent consultation with cardiovascular surgery.  2.  Retained fluid/debris in the upper thoracic esophagus likely secondary to mass effect from the aneurysmally dilated chronic aortic dissection.  3.  Ectasia of the ascending thoracic aorta to 4.1 cm.  4.  Incompletely imaged, potentially solid lesion exophytic from the interpolar right kidney warrants further evaluation to exclude renal cell carcinoma. This can be further evaluated on the CTA of the abdomen and pelvis.  5.  Left lower lobe atelectasis and small, simple left pleural effusion.  6.  Nonspecific 1 cm nodule in the posterior, inferior left thyroid lobe.  If clinically warranted, thyroid ultrasound could further evaluate.  Critical Value/emergent results were called by telephone at the time of interpretation on 10/09/2011 at 04:20 p.m. to Dr. Weldon Inches, who verbally acknowledged these results.  Signed,  Sterling Big, MD Vascular & Interventional Radiologist [facility]   Original Report Authenticated By: Vilma Prader     @ROS @ Constitutional: Positive for unexpected weight change. Negative for fever, chills and diaphoresis.  Respiratory: Positive for cough. Negative for chest tightness and shortness of breath.  Cardiovascular: Negative for chest pain and leg swelling.  Gastrointestinal: Negative for nausea and vomiting.  Skin: Negative for color change and pallor.  Neurological:  Negative for weakness and headaches.  Hematological: Does not bruise/bleed easily.  Psychiatric/Behavioral: Negative for confusion.   Blood pressure 151/116, pulse 86, temperature 98.6 F (37 C), temperature source Oral, resp. rate 17, SpO2 94.00%. Constitutional: He appears well-developed and well-nourished. No distress.  HENT: Head: Normocephalic and atraumatic. Eyes: Conjunctivae normal and EOM are normal.  Neck: Normal range of motion. Neck supple.  Cardiovascular: Normal rate, regular rhythm and intact distal pulses. II/VI systolic murmur heard.  Pulmonary/Chest: Effort normal and breath sounds normal. No respiratory distress.  Abdominal: Soft. Bowel sounds are normal. He exhibits no distension. There is no tenderness.  Musculoskeletal: Normal range of motion. He exhibits no edema.  Neurological: He is alert and oriented to person, place, and time. No cranial nerve deficit.  Skin: Skin is warm and dry.  Psychiatric: He has a normal mood and affect. Thought content normal.    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

## 2011-10-09 NOTE — Progress Notes (Signed)
Charge RN, Maralyn Sago, spoke to Dr. Algie Coffer regarding pt status and assignment of step-down bed. Per Dr. Algie Coffer and charge nurse - placement is appropriate at this time.

## 2011-10-09 NOTE — ED Notes (Signed)
Pt returned to room from CT, denies pain, no distress noted, placed on cardiac monitor

## 2011-10-09 NOTE — ED Provider Notes (Addendum)
History     CSN: [id number]  Arrival date & time 10/09/11  4098   First MD Initiated Contact with Patient 10/09/11 1035      Chief Complaint  Patient presents with  . Hemoptysis    (Consider location/radiation/quality/duration/timing/severity/associated sxs/prior treatment) The history is provided by the patient and the spouse.   66 year old, male, smoker, with history of coronary artery disease, and myocardial infarction, presents emergency department complaining of coughing of blood.  Today.  He denies taking any anticoagulants.  Other than aspirin.  He denies pain anywhere.  He has not had fevers, chills, lightheadedness, or shortness of breath.  He has not been traveling recently.  He denies leg pain or swelling.  He does say that he has had involuntary weight loss.  He has not had night sweats.  Past Medical History  Diagnosis Date  . Coronary artery disease   . Hypertension   . Myocardial infarct     History reviewed. No pertinent past surgical history.  No family history on file.  History  Substance Use Topics  . Smoking status: Current Every Day Smoker -- 0.5 packs/day    Types: Cigarettes  . Smokeless tobacco: Not on file  . Alcohol Use: Yes     weekends      Review of Systems  Constitutional: Positive for unexpected weight change. Negative for fever, chills and diaphoresis.  Respiratory: Positive for cough. Negative for chest tightness and shortness of breath.   Cardiovascular: Negative for chest pain and leg swelling.  Gastrointestinal: Negative for nausea and vomiting.  Skin: Negative for color change and pallor.  Neurological: Negative for weakness and headaches.  Hematological: Does not bruise/bleed easily.  Psychiatric/Behavioral: Negative for confusion.  All other systems reviewed and are negative.    Allergies  Review of patient's allergies indicates no known allergies.  Home Medications   Current Outpatient Rx  Name Route Sig Dispense  Refill  . ASPIRIN EC 81 MG PO TBEC Oral Take 81 mg by mouth daily.    . ATENOLOL 50 MG PO TABS Oral Take 50 mg by mouth 2 (two) times daily.    Marland Kitchen LISINOPRIL 40 MG PO TABS Oral Take 40 mg by mouth 2 (two) times daily.    Marland Kitchen SIMVASTATIN 20 MG PO TABS Oral Take 20 mg by mouth every evening.      BP 146/105  Pulse 86  Temp 98.6 F (37 C) (Oral)  Resp 17  SpO2 98%  Physical Exam  Nursing note and vitals reviewed. Constitutional: He is oriented to person, place, and time. He appears well-developed and well-nourished. No distress.  HENT:  Head: Normocephalic and atraumatic.  Eyes: Conjunctivae normal and EOM are normal.       No pallor  Neck: Normal range of motion. Neck supple.  Cardiovascular: Normal rate, regular rhythm and intact distal pulses.   No murmur heard. Pulmonary/Chest: Effort normal and breath sounds normal. No respiratory distress.  Abdominal: Soft. Bowel sounds are normal. He exhibits no distension. There is no tenderness.  Musculoskeletal: Normal range of motion. He exhibits no edema.  Neurological: He is alert and oriented to person, place, and time. No cranial nerve deficit.  Skin: Skin is warm and dry.  Psychiatric: He has a normal mood and affect. Thought content normal.    ED Course  Procedures (including critical care time) 66 year old, male, smoker, with hemoptysis, and involuntary weight loss.  No lightheadedness, or shortness of breath.  No pain anywhere.  No no suggestion of COPD.  Patient has approximately 10 cc of bright red blood in a urine specimen cup.  I'm concerned that he has a malignancy.  We will do a chest x-ray, and laboratory testing, for evaluation.  Labs Reviewed  BASIC METABOLIC PANEL - Abnormal; Notable for the following:    Sodium 134 (*)     Potassium 3.3 (*)     Chloride 95 (*)     Glucose, Bld 101 (*)     BUN 5 (*)     GFR calc non Af Amer 88 (*)     All other components within normal limits  CBC WITH DIFFERENTIAL - Abnormal;  Notable for the following:    RBC 4.17 (*)     Hemoglobin 11.1 (*)     HCT 34.3 (*)     All other components within normal limits   Dg Chest 2 View  10/09/2011  *RADIOLOGY REPORT*  Clinical Data: Hemoptysis.  CHEST - 2 VIEW  Comparison: No priors.  Findings: There is a huge mass like opacity filling the majority of the left hemithorax, which appears to be centered in the left lower lobe, which causes significant upward bowing of the left major fissure.  This is highly concerning for neoplasm.  Lungs are otherwise clear.  Heart size is normal.  No evidence of pulmonary edema.  IMPRESSION: 1.  Large mass-like opacity centered in the left lower lobe, as above.  Further evaluation with contrast enhanced chest CT is highly recommended that this time, as this finding is likely to represent a malignant neoplasm.   Original Report Authenticated By: Florencia Reasons, M.D.      No diagnosis found.  Chest x-ray shows signs of malignancy.  So we will do a CAT scan for further evaluation.  4:23 PM Spoke with Dr. Morton Peters. He will come see pt.  4:52 PM Spoke with dr. Algie Coffer. He will admit  MDM  Hemoptysis. Suspect malignancy        Cheri Guppy, MD 10/09/11 1448  Cheri Guppy, MD 10/09/11 1623  Cheri Guppy, MD 10/09/11 [phone]

## 2011-10-09 NOTE — Progress Notes (Signed)
Echocardiogram 2D Echocardiogram has been performed.  Jesse Le 10/09/2011, 7:04 PM

## 2011-10-09 NOTE — ED Notes (Signed)
Patient transported to CT 

## 2011-10-09 NOTE — ED Notes (Signed)
C/o nasal congestion, runny nose, prod cough x 1 week, cannot recall color of sputum. States this morning coughed up blood, bright red. Denies fever, chills, SOB, sore throat, pain. Wife reports pt felt hot yesterday.   Presently resp e/u, no distress.

## 2011-10-10 LAB — BASIC METABOLIC PANEL
BUN: 6 mg/dL (ref 6–23)
GFR calc Af Amer: >90 mL/min (ref >90)
GFR calc non Af Amer: 87 mL/min — ABNORMAL LOW (ref >90)
Potassium: 3.8 mEq/L (ref 3.5–5.1)
Sodium: 133 mEq/L — ABNORMAL LOW (ref 135–145)

## 2011-10-10 LAB — URINALYSIS, ROUTINE W REFLEX MICROSCOPIC
Glucose, UA: NEGATIVE mg/dL
Hgb urine dipstick: NEGATIVE
Ketones, ur: 15 mg/dL — AB
Nitrite: NEGATIVE
Protein, ur: 30 mg/dL — AB
Specific Gravity, Urine: 1.04 — ABNORMAL HIGH (ref 1.005–1.030)
Urobilinogen, UA: 2 mg/dL — ABNORMAL HIGH (ref 0.0–1.0)
pH: 5.5 (ref 5.0–8.0)

## 2011-10-10 LAB — CBC
Hemoglobin: 10.3 g/dL — ABNORMAL LOW (ref 13.0–17.0)
MCHC: 31.9 g/dL (ref 30.0–36.0)
Platelets: 259 10*3/uL (ref 150–400)

## 2011-10-10 LAB — PROTIME-INR
INR: 1.33 (ref 0.00–1.49)
Prothrombin Time: 16.2 seconds — ABNORMAL HIGH (ref 11.6–15.2)

## 2011-10-10 LAB — URINE MICROSCOPIC-ADD ON

## 2011-10-10 MED ORDER — MORPHINE SULFATE 4 MG/ML IJ SOLN
4.0000 mg | Freq: Once | INTRAMUSCULAR | Status: AC
  Administered 2011-10-10: 4 mg via INTRAVENOUS
  Filled 2011-10-10: qty 1

## 2011-10-10 MED ORDER — INFLUENZA VIRUS VACC SPLIT PF IM SUSP
0.5000 mL | INTRAMUSCULAR | Status: AC
  Administered 2011-10-12: 0.5 mL via INTRAMUSCULAR
  Filled 2011-10-10: qty 0.5

## 2011-10-10 MED ORDER — PNEUMOCOCCAL VAC POLYVALENT 25 MCG/0.5ML IJ INJ
0.5000 mL | INJECTION | INTRAMUSCULAR | Status: AC
  Administered 2011-10-12: 0.5 mL via INTRAMUSCULAR
  Filled 2011-10-10: qty 0.5

## 2011-10-10 NOTE — Progress Notes (Signed)
10/10/2011 2230  Pt coughed up about 400 cc of bright red blood with clots.  He is alert and oriented. VSS, BP 115/85 HR 109 SpO2 93% on RA and he denies pain. Rapid Respond RN, April at bedside. Dr. Algie Coffer notified, new orders received for a CBC, BMET, and to be NPO after midnight.  Dr. Donata Clay also notified, new orders received for one time dose of 4 mg of morphine IV. Will carry out orders and continue to monitor closely.  [place] Keo Schirmer, RN

## 2011-10-10 NOTE — Progress Notes (Signed)
  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 extending from aortic isthmus to below the renal vessels COPD, hemoptysis resolved today 2-D echo reviewed moderate LV dysfunction no significant aortic insufficiency noted no pericardial effusion  Objective: Vital signs in last 24 hours: Temp:  [98 F (36.7 C)-99.2 F (37.3 C)] 99.2 F (37.3 C) (09/29 1709) Pulse Rate:  [66-90] 66  (09/29 1200) Cardiac Rhythm:  [-] Normal sinus rhythm (09/29 0740) Resp:  [14-30] 23  (09/29 1200) BP: (107-156)/(73-111) 107/73 mmHg (09/29 1200) SpO2:  [94 %-98 %] 98 % (09/29 1200) Weight:  [255 lb 11.7 oz (116 kg)] 255 lb 11.7 oz (116 kg) (09/29 0500)  Hemodynamic parameters for last 24 hours:   blood pressure controlled sinus rhythm  Intake/Output from previous day: 09/28 0701 - 09/29 0700 In: 573 [P.O.:240; I.V.:333] Out: 300 [Urine:300] Intake/Output this shift: Total I/O In: 690 [P.O.:480; I.V.:210] Out: 300 [Urine:300]  Distant breath sounds Peripheral pulses intact Abdomen nontender  Lab Results:  Basename 10/10/11 0505 10/09/11 1914  WBC 8.3 9.7  HGB 10.3* 11.1*  HCT 32.3* 35.0*  PLT 259 251   BMET:  Basename 10/10/11 0505 10/09/11 1914  NA 133* 132*  K 3.8 4.0  CL 95* 93*  CO2 29 29  GLUCOSE 91 84  BUN 6 6  CREATININE 0.89 0.90  CALCIUM 8.8 9.1    PT/INR:  Basename 10/10/11 0505  LABPROT 16.2*  INR 1.33   ABG No results found for this basename: phart, pco2, po2, hco3, tco2, acidbasedef, o2sat   CBG (last 3)  No results found for this basename: GLUCAP:3 in the last 72 hours  Assessment/Plan: S/P   Plan avoid aspirin, Lovenox, anticoagulation due to hemoptysis which is getting better 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 Surgical resection would be extremely high-risk and probably not operable. Will review with colleagues. Continue careful blood pressure monitoring and observation. I discussed the  extent of surgery that would be required for open repair of this thoracoabdominal aneurysm with the patient--he is not in favor of a large operation with extremely high risk. Will follow   LOS: 1 day    VAN TRIGT III,Avilyn Virtue 10/10/2011

## 2011-10-10 NOTE — Progress Notes (Signed)
Subjective:  Feeling better. Hgb down to 10.3. T max 99.1 F. 2-D echo-Mod LVH. EF 45 to 50 %  Objective:  Vital Signs in the last 24 hours: Temp:  [98.4 F (36.9 C)-99.1 F (37.3 C)] 98.6 F (37 C) (09/29 0740) Pulse Rate:  [79-93] 79  (09/29 0740) Cardiac Rhythm:  [-] Normal sinus rhythm (09/29 0740) Resp:  [14-30] 25  (09/29 0740) BP: (108-166)/(76-128) 108/76 mmHg (09/29 0740) SpO2:  [93 %-98 %] 96 % (09/29 0740) Weight:  [116 kg (255 lb 11.7 oz)] 116 kg (255 lb 11.7 oz) (09/29 0500)  Physical Exam: BP Readings from Last 1 Encounters:  10/10/11 108/76    Wt Readings from Last 1 Encounters:  10/10/11 116 kg (255 lb 11.7 oz)    Weight change:   HEENT: [place]/AT, Eyes-Brown, PERL, EOMI, Conjunctiva-Pale pink, Sclera-Non-icteric Neck: No JVD, No bruit, Trachea midline. Lungs:  Clear, Bilateral. Cardiac:  Regular rhythm, normal S1 and S2, no S3. II/VI systolic murmur Abdomen:  Soft, non-tender. Extremities:  No edema present. No cyanosis. No clubbing. CNS: AxOx3, Cranial nerves grossly intact, moves all 4 extremities. Right handed. Skin: Warm and dry.   Intake/Output from previous day: 09/28 0701 - 09/29 0700 In: 573 [P.O.:240; I.V.:333] Out: 300 [Urine:300]    Lab Results: BMET    Component Value Date/Time   NA 133* 10/10/2011 0505   K 3.8 10/10/2011 0505   CL 95* 10/10/2011 0505   CO2 29 10/10/2011 0505   GLUCOSE 91 10/10/2011 0505   BUN 6 10/10/2011 0505   CREATININE 0.89 10/10/2011 0505   CALCIUM 8.8 10/10/2011 0505   GFRNONAA 87* 10/10/2011 0505   GFRAA >90 10/10/2011 0505   CBC    Component Value Date/Time   WBC 8.3 10/10/2011 0505   RBC 3.90* 10/10/2011 0505   HGB 10.3* 10/10/2011 0505   HCT 32.3* 10/10/2011 0505   PLT 259 10/10/2011 0505   MCV 82.8 10/10/2011 0505   MCH 26.4 10/10/2011 0505   MCHC 31.9 10/10/2011 0505   RDW 14.8 10/10/2011 0505   LYMPHSABS 1.5 10/09/2011 1013   MONOABS 0.5 10/09/2011 1013   EOSABS 0.1 10/09/2011 1013   BASOSABS 0.0 10/09/2011 1013    CARDIAC ENZYMES No results found for this basename: CKTOTAL, CKMB, CKMBINDEX, TROPONINI    Assessment/Plan:  Patient Active Hospital Problem List: 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    LOS: 1 day    Orpah Cobb  MD  10/10/2011, 10:07 AM

## 2011-10-11 ENCOUNTER — Encounter (HOSPITAL_COMMUNITY): Payer: Medicare Other

## 2011-10-11 DIAGNOSIS — I716 Thoracoabdominal aortic aneurysm, without rupture: Secondary | ICD-10-CM

## 2011-10-11 DIAGNOSIS — R042 Hemoptysis: Secondary | ICD-10-CM

## 2011-10-11 DIAGNOSIS — I219 Acute myocardial infarction, unspecified: Secondary | ICD-10-CM

## 2011-10-11 DIAGNOSIS — I7101 Dissection of thoracic aorta: Secondary | ICD-10-CM

## 2011-10-11 LAB — BASIC METABOLIC PANEL
CO2: 31 mEq/L (ref 19–32)
Calcium: 8.8 mg/dL (ref 8.4–10.5)
Chloride: 94 mEq/L — ABNORMAL LOW (ref 96–112)
Glucose, Bld: 105 mg/dL — ABNORMAL HIGH (ref 70–99)
Potassium: 3.6 mEq/L (ref 3.5–5.1)
Sodium: 131 mEq/L — ABNORMAL LOW (ref 135–145)

## 2011-10-11 LAB — CBC
Hemoglobin: 9.5 g/dL — ABNORMAL LOW (ref 13.0–17.0)
MCH: 26.5 pg (ref 26.0–34.0)
MCV: 83.5 fL (ref 78.0–100.0)
Platelets: 205 10*3/uL (ref 150–400)
RBC: 3.58 MIL/uL — ABNORMAL LOW (ref 4.22–5.81)
WBC: 6.9 10*3/uL (ref 4.0–10.5)

## 2011-10-11 MED ORDER — AMOXICILLIN-POT CLAVULANATE 875-125 MG PO TABS
1.0000 | ORAL_TABLET | Freq: Two times a day (BID) | ORAL | Status: DC
  Administered 2011-10-11 – 2011-10-21 (×20): 1 via ORAL
  Filled 2011-10-11 (×23): qty 1

## 2011-10-11 MED ORDER — METOPROLOL TARTRATE 50 MG PO TABS
50.0000 mg | ORAL_TABLET | Freq: Three times a day (TID) | ORAL | Status: DC
  Administered 2011-10-11 – 2011-10-12 (×3): 50 mg via ORAL
  Filled 2011-10-11 (×5): qty 1

## 2011-10-11 NOTE — Consult Note (Signed)
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 CT surgery was consulted, and patient is deemed not a candidate for surgery. Discussed in detail with patient and his family regarding limited treatment choices. No surgical option as per CT surgeryThey understand the situation, also recommended to quit smoking.  Goals of Care: 1.  Code Status: DNR   2. Scope of Treatment: 1. Vital Signs: per protocol  2. Antibiotics: Continue as needed 3. Blood Products: Continue as needed 4. IVF: Continue as needed 5. Review of Medications to be discontinued: None 6. Labs: Continue as needed   3. Disposition: Home   Symptom management:  ? Dysphagia: Will advance the diet to soft diet, if has difficulty swallowing, would recommend swallow evaluation.  Hemoptysis Patient started on augmentin today, if no improvement with antibiotics, consider pulmonary consult.        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 CT surgery was consulted, and patient is deemed not a candidate for surgery. Palliative care approach was recommended by CT Surgery. Goals of care meeting done with patient, his girl friend, and daughter. Discussed code status, aggressive care , high risk for surgery, comfort care approach.  ROS: Anxiety-  denies Breathing: denies shortness of breath Nausea/vomiting: denies Appetite: Good Cough: positive Hemoptysis: positive   PMH:  Past Medical History  Diagnosis Date  . Coronary artery disease   . Hypertension   . Myocardial infarct      XNA:TFTDDUK reviewed. No pertinent past surgical history. I have reviewed the FH and SH and  If appropriate update it with new information. No Known Allergies Scheduled Meds:   . amoxicillin-clavulanate  1 tablet Oral BID  . budesonide-formoterol  2 puff Inhalation BID  . chlorpheniramine-HYDROcodone  5 mL Oral BID  . docusate sodium  100 mg Oral BID  . influenza  inactive virus vaccine  0.5 mL Intramuscular Tomorrow-1000  . lisinopril  40 mg Oral BID  . metoprolol tartrate  50 mg Oral TID  .  morphine injection  4 mg Intravenous Once  . nitroGLYCERIN  0.4 mg Transdermal Daily  . pneumococcal 23 valent vaccine  0.5 mL Intramuscular Tomorrow-1000  . simvastatin  20 mg Oral QPM  . sodium chloride  3 mL Intravenous Q12H  . DISCONTD: metoprolol tartrate  50 mg Oral BID   Continuous Infusions:   . dextrose 5 % and 0.45 % NaCl with KCl 20 mEq/L 30 mL/hr at 10/11/11 0600   PRN Meds:.HYDROcodone-acetaminophen, labetalol, ondansetron (ZOFRAN) IV, ondansetron    BP 116/101  Pulse 78  Temp 98.2 F (36.8 C) (Oral)  Resp 20  Ht 6\' 2"  (1.88 m)  Wt 117.6 kg (259 lb 4.2 oz)  BMI 33.29 kg/m2  SpO2 98%   PPS: Intake/Output Summary (Last 24 hours) at 10/11/11 1634 Last data filed at 10/11/11 0829  Gross per 24 hour  Intake   1113 ml  Output    800 ml  Net    313 ml    Physical Exam:  General: Patient appears in no acute distress HEENT:  NCAT Chest: Decreased breath sounds in left lung CVS: S1S2 RRR Abdomen: soft, nontender Ext: No edema Neuro: AO x 3, no focal defecit  Labs: CBC    Component Value Date/Time   WBC 6.9 10/11/2011 0510   RBC 3.58* 10/11/2011 0510   HGB 9.5* 10/11/2011 0510   HCT 29.9* 10/11/2011 0510   PLT 205  10/11/2011 0510   MCV 83.5 10/11/2011 0510   MCH 26.5 10/11/2011 0510   MCHC 31.8 10/11/2011 0510   RDW 14.6 10/11/2011 0510   LYMPHSABS 1.5 10/09/2011 1013   MONOABS 0.5 10/09/2011 1013   EOSABS 0.1 10/09/2011 1013   BASOSABS 0.0 10/09/2011 1013    BMET    Component Value Date/Time   NA 131* 10/11/2011 0510   K 3.6 10/11/2011 0510   CL 94* 10/11/2011 0510   CO2 31 10/11/2011 0510   GLUCOSE 105* 10/11/2011 0510   BUN 7 10/11/2011 0510   CREATININE 1.03 10/11/2011 0510   CALCIUM 8.8 10/11/2011 0510   GFRNONAA 74* 10/11/2011 0510   GFRAA 85* 10/11/2011 0510    CMP     Component Value Date/Time   NA 131* 10/11/2011 0510   K 3.6 10/11/2011 0510   CL 94* 10/11/2011 0510   CO2 31 10/11/2011 0510   GLUCOSE 105* 10/11/2011 0510   BUN 7 10/11/2011 0510   CREATININE 1.03 10/11/2011 0510   CALCIUM 8.8 10/11/2011 0510   PROT 8.4* 10/09/2011 1914   ALBUMIN 2.6* 10/09/2011 1914   AST 17 10/09/2011 1914   ALT 9 10/09/2011 1914   ALKPHOS 54 10/09/2011 1914   BILITOT 0.7 10/09/2011 1914   GFRNONAA 74* 10/11/2011 0510   GFRAA 85* 10/11/2011 0510        Time In Time Out Total Time Spent with Patient Total Overall Time  3 30 pm 5 10 pm 100 min     Greater than 50%  of this time was spent counseling and coordinating care related to the above assessment and plan.

## 2011-10-11 NOTE — Progress Notes (Signed)
Utilization review completed.  

## 2011-10-11 NOTE — Progress Notes (Signed)
  Subjective: Very large thoracoabdominal aneurysm from the distal arch to renal arteries from old type B. dissection with thrombosed false lumen Hemoptysis last night was significant but transient hematocrit since admission drop from 35-30, no chest pain My review from CTA of aorta is that this extensive thoracoabdominal aneurysm is nonoperable, films will be reviewed by colleagues to determine if percutaneous stent graft technology would be possible but with thrombosed false lumen stent grafts usually are not effective. Realistic approach to this would be a palliative care, blood pressure control, and antitussives therapy. I would not recommend intubation and code if he develops severe massive hemoptysis. This was discussed with patient. He appears to be more concerned regarding pain. Objective: Vital signs in last 24 hours: Temp:  [98 F (36.7 C)-99.4 F (37.4 C)] 98.5 F (36.9 C) (09/30 0829) Pulse Rate:  [66-113] 80  (09/30 0800) Cardiac Rhythm:  [-] Normal sinus rhythm (09/30 0800) Resp:  [15-24] 22  (09/30 0800) BP: (100-145)/(58-107) 127/83 mmHg (09/30 0800) SpO2:  [91 %-98 %] 97 % (09/30 0959) Weight:  [259 lb 4.2 oz (117.6 kg)] 259 lb 4.2 oz (117.6 kg) (09/30 0500)  Hemodynamic parameters for last 24 hours:   sinus rhythm blood pressure controlled  Intake/Output from previous day: 09/29 0701 - 09/30 0700 In: 1863 [P.O.:1200; I.V.:663] Out: 1000 [Urine:1000] Intake/Output this shift:    Lungs distant breath sounds Positive palpable pulses Abdomen nontender  Lab Results:  Basename 10/11/11 0510 10/10/11 0505  WBC 6.9 8.3  HGB 9.5* 10.3*  HCT 29.9* 32.3*  PLT 205 259   BMET:  Basename 10/11/11 0510 10/10/11 0505  NA 131* 133*  K 3.6 3.8  CL 94* 95*  CO2 31 29  GLUCOSE 105* 91  BUN 7 6  CREATININE 1.03 0.89  CALCIUM 8.8 8.8    PT/INR:  Basename 10/10/11 0505  LABPROT 16.2*  INR 1.33   ABG No results found for this basename: phart, pco2, po2, hco3,  tco2, acidbasedef, o2sat   CBG (last 3)  No results found for this basename: GLUCAP:3 in the last 72 hours  Assessment/Plan: S/P   Continue to follow recommend palliative care consult   LOS: 2 days    VAN TRIGT III,Shane Melby 10/11/2011

## 2011-10-11 NOTE — Progress Notes (Signed)
Subjective:  Patient denies any chest pain or shortness of breath. Complains of cough with the hemoptysis which is improved since last night. Discussed with patient regarding carotid dissection and very large aneurysm and CODE STATUS agrees for DO NOT RESUSCITATE and palliative care.  Objective:  Vital Signs in the last 24 hours: Temp:  [98.2 F (36.8 C)-99.4 F (37.4 C)] 98.2 F (36.8 C) (09/30 1154) Pulse Rate:  [66-113] 80  (09/30 0800) Resp:  [15-24] 22  (09/30 0800) BP: (100-145)/(58-107) 127/83 mmHg (09/30 0800) SpO2:  [91 %-97 %] 97 % (09/30 0959) Weight:  [117.6 kg (259 lb 4.2 oz)] 117.6 kg (259 lb 4.2 oz) (09/30 0500)  Intake/Output from previous day: 09/29 0701 - 09/30 0700 In: 1863 [P.O.:1200; I.V.:663] Out: 1000 [Urine:1000] Intake/Output from this shift:    Physical Exam: Neck: no adenopathy, no carotid bruit, no JVD and supple, symmetrical, trachea midline Lungs: clear to auscultation bilaterally Heart: regular rate and rhythm, S1, S2 normal and Soft systolic murmur noted Abdomen: soft, non-tender; bowel sounds normal; no masses,  no organomegaly Extremities: extremities normal, atraumatic, no cyanosis or edema  Lab Results:  Basename 10/11/11 0510 10/10/11 0505  WBC 6.9 8.3  HGB 9.5* 10.3*  PLT 205 259    Basename 10/11/11 0510 10/10/11 0505  NA 131* 133*  K 3.6 3.8  CL 94* 95*  CO2 31 29  GLUCOSE 105* 91  BUN 7 6  CREATININE 1.03 0.89   No results found for this basename: TROPONINI:2,CK,MB:2 in the last 72 hours Hepatic Function Panel  Basename 10/09/11 1914  PROT 8.4*  ALBUMIN 2.6*  AST 17  ALT 9  ALKPHOS 54  BILITOT 0.7  BILIDIR --  IBILI --   No results found for this basename: CHOL in the last 72 hours No results found for this basename: PROTIME in the last 72 hours  Imaging: Imaging results have been reviewed and Ct Chest W Contrast  10/09/2011  *RADIOLOGY REPORT*  Clinical Data:  Hemoptysis, possible malignancy  CT CHEST WITH  CONTRAST  Technique:  Multidetector CT imaging of the chest was performed following the standard protocol during bolus administration of intravenous contrast.  Contrast: 80mL OMNIPAQUE IOHEXOL 300 MG/ML  SOLN  Comparison: Chest x-ray obtained earlier today, 10/09/2011 at 12:00  Findings:  Mediastinum: 1 cm nodule along the posterior inferior aspect of the left thyroid gland is nonspecific by CT.  The remainder the thoracic inlet is unremarkable.  No suspicious mediastinal or hilar adenopathy. The thoracic esophagus is partially compressed by the mass effect.  There is retained fluid/debris in the upper thoracic esophagus.  Heart/Vascular: Ectasia of the ascending aorta which measures up to 4.1 cm in diameter. Heart is within normal limits for size.  The left atrium and left pulmonary veins are displaced anteriorly and rightward by the large descending thoracic aortic aneurysmal dissection.  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  The dissection flap begins in the proximal descending thoracic aorta.  A large fenestration with the partially thrombosed false lumen can be seen on image 26 of series 2.  The true lumen is compressed but patent.  The false lumen is almost entirely thrombosed there is a small amount of contrast material which extends inferiorly to the level of the renal artery. The left renal artery arises from the false lumen.  Celiac axis and SMA and right renal artery arise from the true lumen although the dissection flap does extend into the proximal celiac and  SMA but does not appear flow limiting.  The descending thoracic aorta measures up to 12 x 11 cm in greatest dimension at the level of the pulmonic trunk.  The aorta measures 5.8 x 5.3 cm at the aortic hiatus and 5.9 x 4.7 cm of the SMA origin. The aorta exerts mass effect on the heart, the esophagus and left mainstem and left lower lobe bronchus.  There is associated atelectasis of the  overlying left lower lobe and a small left pleural effusion which appears simple, and not hemorrhagic.  Lungs/Pleura: Minimal paraseptal emphysema in the bilateral apices. Compressive atelectasis in the left lower lobe.  Small radiographically simple left pleural effusion.  Negative for edema, focal airspace consolidation, pulmonary nodule or mass.  The left lower lobe bronchi are compressed by mass effect.  Upper Abdomen: Aneurysmally dilated aortic dissection as detailed above.  A 5.3 cm fluid attenuation cystic lesion exophytic from the upper pole of the left kidney likely represents a simple cyst. Incompletely imaged 1.3 cm exophytic from the interpolar right kidney may be solid. Cholelithiasis without evidence of cholecystitis.  Bones: No acute fracture or aggressive appearing lytic or blastic osseous lesion.  IMPRESSION:  1.  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  The descending thoracic aorta measures up to 12 x 11 cm in diameter and exerts mass effect on the the heart, esophagus, left mainstem bronchus and left lower lobe bronchi.  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  Recommend CT of the abdomen and pelvis to further evaluate the inferior extent of the aneurysmal dissection as well as urgent consultation with cardiovascular surgery.  2.  Retained fluid/debris in the upper thoracic esophagus likely secondary to mass effect from the aneurysmally dilated chronic aortic dissection.  3.  Ectasia of the ascending thoracic aorta to 4.1 cm.  4.  Incompletely imaged, potentially solid lesion exophytic from the interpolar right kidney warrants further evaluation to exclude renal cell carcinoma. This can be further evaluated on the CTA of the abdomen and pelvis.  5.  Left lower lobe atelectasis and small, simple left  pleural effusion.  6.  Nonspecific 1 cm nodule in the posterior, inferior left thyroid lobe.  If clinically warranted, thyroid ultrasound could further evaluate.  Critical Value/emergent results were called by telephone at the time of interpretation on 10/09/2011 at 04:20 p.m. to Dr. Weldon Inches, who verbally acknowledged these results.  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 Start Augmentin per orders  LOS: 2 days    Yehonatan Grandison N 10/11/2011, 12:31 PM

## 2011-10-11 NOTE — Progress Notes (Addendum)
Called by primary RN to see pt for hemoptysis.  On arrival VSS & pt with a basin containing bright red sputum.  Pt states this has happened before and resolved.  Dr. Algie Coffer & Dr. Donata Clay updated.  Will cont. To monitor.  Pt made NPO for possible procedure in AM.

## 2011-10-12 ENCOUNTER — Encounter (HOSPITAL_COMMUNITY): Payer: Self-pay | Admitting: Anesthesiology

## 2011-10-12 ENCOUNTER — Inpatient Hospital Stay (HOSPITAL_COMMUNITY): Payer: Medicare Other

## 2011-10-12 DIAGNOSIS — I716 Thoracoabdominal aortic aneurysm, without rupture: Secondary | ICD-10-CM

## 2011-10-12 LAB — BASIC METABOLIC PANEL
BUN: 6 mg/dL (ref 6–23)
Chloride: 93 mEq/L — ABNORMAL LOW (ref 96–112)
Creatinine, Ser: 1.03 mg/dL (ref 0.50–1.35)
GFR calc non Af Amer: 74 mL/min — ABNORMAL LOW (ref >90)
Glucose, Bld: 92 mg/dL (ref 70–99)
Potassium: 3.8 mEq/L (ref 3.5–5.1)

## 2011-10-12 LAB — CBC
HCT: 29.6 % — ABNORMAL LOW (ref 39.0–52.0)
Hemoglobin: 9.6 g/dL — ABNORMAL LOW (ref 13.0–17.0)
MCHC: 32.4 g/dL (ref 30.0–36.0)
MCV: 82.7 fL (ref 78.0–100.0)

## 2011-10-12 MED ORDER — IOHEXOL 350 MG/ML SOLN
100.0000 mL | Freq: Once | INTRAVENOUS | Status: AC | PRN
  Administered 2011-10-12: 100 mL via INTRAVENOUS

## 2011-10-12 MED ORDER — GUAIFENESIN 100 MG/5ML PO SYRP
200.0000 mg | ORAL_SOLUTION | ORAL | Status: DC | PRN
  Filled 2011-10-12: qty 118

## 2011-10-12 MED ORDER — METOPROLOL SUCCINATE ER 100 MG PO TB24
100.0000 mg | ORAL_TABLET | Freq: Two times a day (BID) | ORAL | Status: DC
  Administered 2011-10-12: 100 mg via ORAL
  Filled 2011-10-12 (×5): qty 1

## 2011-10-12 MED ORDER — GUAIFENESIN 100 MG/5ML PO SOLN
10.0000 mL | ORAL | Status: DC | PRN
  Administered 2011-10-12: 200 mg via ORAL
  Filled 2011-10-12 (×2): qty 10

## 2011-10-12 NOTE — Consult Note (Signed)
[facility] [place]      Consult Note  Patient name: Jesse Le MRN: [id number] DOB: [birth year] Sex: male  Consulting Physician:  Dr. Morton Peters  Reason for Consult:  Chief Complaint  Patient presents with  . Hemoptysis    HISTORY OF PRESENT ILLNESS: 66 yo admitted for coughing up blood.  He underwent a CT of his chest which revealed a large descending thoracic aneurysm secondary to a chronic dissection which extends below the limits of the CT scan.  He has had several episodes of hemoptysis in the hospital, and has been coughing a lot.  He is a regular drinker, and smokes.  He is not having chest pain.  He has a history of CAD and is s/p MI. He underwent cardiac cath in 2008.  He is medically managed for his hypertension  Past Medical History  Diagnosis Date  . Coronary artery disease   . Hypertension   . Myocardial infarct     History reviewed. No pertinent past surgical history.  History   Social History  . Marital Status: Married    Spouse Name: N/A    Number of Children: N/A  . Years of Education: N/A   Occupational History  . Not on file.   Social History Main Topics  . Smoking status: Current Every Day Smoker -- 0.5 packs/day    Types: Cigarettes  . Smokeless tobacco: Not on file  . Alcohol Use: Yes     weekends  . Drug Use: No  . Sexually Active:    Other Topics Concern  . Not on file   Social History Narrative  . No narrative on file    No family history on file.  Allergies as of 10/09/2011  . (No Known Allergies)    No current facility-administered medications on file prior to encounter.   Current Outpatient Prescriptions on File Prior to Encounter  Medication Sig Dispense Refill  . atenolol (TENORMIN) 50 MG tablet Take 50 mg by mouth 2 (two) times daily.      Marland Kitchen lisinopril (PRINIVIL,ZESTRIL) 40 MG tablet Take 40 mg by mouth 2 (two) times daily.      . simvastatin (ZOCOR) 20 MG tablet Take 20 mg by mouth every evening.          REVIEW OF SYSTEMS: Cardiovascular: No chest pain, chest pressure, palpitations, orthopnea, or dyspnea on exertion. No claudication or rest pain,  No history of DVT or phlebitis. Pulmonary: positive coughing up blood Neurologic: No weakness, paresthesias, aphasia, or amaurosis. No dizziness. Hematologic: No bleeding problems or clotting disorders. Musculoskeletal: No joint pain or joint swelling. Gastrointestinal: No blood in stool or hematemesis Genitourinary: No dysuria or hematuria. Psychiatric:: No history of major depression. Integumentary: No rashes or ulcers. Constitutional: No fever or chills. Positive weight loss  PHYSICAL EXAMINATION: General: The patient appears their stated age.  Vital signs are BP 101/64  Pulse 73  Temp 98.5 F (36.9 C) (Oral)  Resp 21  Ht 6\' 2"  (1.88 m)  Wt 256 lb 6.3 oz (116.3 kg)  BMI 32.92 kg/m2  SpO2 94% Pulmonary: Respirations are non-labored HEENT:  No gross abnormalities Abdomen: Soft and non-tender  Musculoskeletal: There are no major deformities.   Neurologic: No focal weakness or paresthesias are detected, Skin: There are no ulcer or rashes noted. Psychiatric: The patient has normal affect. Cardiovascular: There is a regular rate and rhythm without significant murmur appreciated.  Palpable PT pulses and femoral pulses bilaterally.  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  Given his overall medical condition, he is not a candidate for open surgical correction.  He may be a candidate for endovascular repair.  I am ordering a CTA of the chest, abdomen and pelvis to better evaluate his dissection.  Even if a stent graft can be placed, this condition has a very hight morbidity and mortality.  The  risk of infection of the stent is also a possibility.  I discussed this with the patient and family.  I will talk with them further the CT scan.     Jorge Ny, M.D. [facility] [place] Office: [phone] Pager:  [phone]

## 2011-10-12 NOTE — Progress Notes (Signed)
Patient's wife requesting something extra for patient's coughing espiode's. Notified MD and obtained new order. Patient has just coughed up moderate amt. Of bloody sputum.

## 2011-10-12 NOTE — Progress Notes (Signed)
Subjective:  Patient denies any chest pain or shortness of breath. States cough and hemoptysis has improved. Blood pressure is labile.  Objective:  Vital Signs in the last 24 hours: Temp:  [98.2 F (36.8 C)-100.2 F (37.9 C)] 98.7 F (37.1 C) (10/01 0725) Pulse Rate:  [65-82] 74  (10/01 1057) Resp:  [20-28] 28  (10/01 0400) BP: (96-126)/(56-101) 117/97 mmHg (10/01 0725) SpO2:  [93 %-98 %] 94 % (10/01 0400) Weight:  [116.3 kg (256 lb 6.3 oz)] 116.3 kg (256 lb 6.3 oz) (10/01 0400)  Intake/Output from previous day: 09/30 0701 - 10/01 0700 In: 3 [I.V.:3] Out: 200 [Urine:200] Intake/Output from this shift: Total I/O In: 3 [I.V.:3] Out: 0   Physical Exam: Neck: no adenopathy, no carotid bruit, no JVD and supple, symmetrical, trachea midline Lungs: clear to auscultation bilaterally Heart: regular rate and rhythm, S1, S2 normal and Soft systolic murmur noted Abdomen: soft, non-tender; bowel sounds normal; no masses,  no organomegaly Extremities: extremities normal, atraumatic, no cyanosis or edema  Lab Results:  Basename 10/12/11 0538 10/11/11 0510  WBC 7.0 6.9  HGB 9.6* 9.5*  PLT 243 205    Basename 10/12/11 0538 10/11/11 0510  NA 131* 131*  K 3.8 3.6  CL 93* 94*  CO2 27 31  GLUCOSE 92 105*  BUN 6 7  CREATININE 1.03 1.03   No results found for this basename: TROPONINI:2,CK,MB:2 in the last 72 hours Hepatic Function Panel  Basename 10/09/11 1914  PROT 8.4*  ALBUMIN 2.6*  AST 17  ALT 9  ALKPHOS 54  BILITOT 0.7  BILIDIR --  IBILI --   No results found for this basename: CHOL in the last 72 hours No results found for this basename: PROTIME in the last 72 hours  Imaging: Imaging results have been reviewed and No results found.  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

## 2011-10-12 NOTE — Progress Notes (Signed)
Patient down to CT scan in bed. Continues to be coughing up more blood

## 2011-10-13 DIAGNOSIS — I716 Thoracoabdominal aortic aneurysm, without rupture: Secondary | ICD-10-CM

## 2011-10-13 LAB — CBC
HCT: 29.8 % — ABNORMAL LOW (ref 39.0–52.0)
Hemoglobin: 9.4 g/dL — ABNORMAL LOW (ref 13.0–17.0)
RBC: 3.6 MIL/uL — ABNORMAL LOW (ref 4.22–5.81)
RDW: 14.4 % (ref 11.5–15.5)
WBC: 7.1 10*3/uL (ref 4.0–10.5)

## 2011-10-13 LAB — COMPREHENSIVE METABOLIC PANEL
BUN: 5 mg/dL — ABNORMAL LOW (ref 6–23)
Calcium: 9.3 mg/dL (ref 8.4–10.5)
Creatinine, Ser: 0.98 mg/dL (ref 0.50–1.35)
GFR calc Af Amer: >90 mL/min (ref >90)
Glucose, Bld: 110 mg/dL — ABNORMAL HIGH (ref 70–99)
Total Protein: 7.9 g/dL (ref 6.0–8.3)

## 2011-10-13 LAB — PROTIME-INR: INR: 1.27 (ref 0.00–1.49)

## 2011-10-13 LAB — APTT: aPTT: 37 seconds (ref 24–37)

## 2011-10-13 MED ORDER — DEXTROSE 5 % IV SOLN
1.5000 g | INTRAVENOUS | Status: AC
  Administered 2011-10-14: 1.5 g via INTRAVENOUS
  Filled 2011-10-13: qty 1.5

## 2011-10-13 MED ORDER — METOPROLOL SUCCINATE ER 100 MG PO TB24
100.0000 mg | ORAL_TABLET | Freq: Two times a day (BID) | ORAL | Status: DC
  Administered 2011-10-13 – 2011-10-19 (×12): 100 mg via ORAL
  Filled 2011-10-13 (×14): qty 1

## 2011-10-13 NOTE — Progress Notes (Signed)
Subjective:  Patient denies any chest pain abdominal pain or shortness of breath. The patient had CT A of chest and abdomen which showed extensive dissection. Cough and hemoptysis has improved  Objective:  Vital Signs in the last 24 hours: Temp:  [98.2 F (36.8 C)-100.1 F (37.8 C)] 98.2 F (36.8 C) (10/02 0755) Pulse Rate:  [70-79] 75  (10/02 0926) Resp:  [19-27] 27  (10/02 0416) BP: (101-128)/(64-89) 128/89 mmHg (10/02 0755) SpO2:  [94 %-98 %] 97 % (10/02 0900) Weight:  [116.5 kg (256 lb 13.4 oz)] 116.5 kg (256 lb 13.4 oz) (10/02 0416)  Intake/Output from previous day: 10/01 0701 - 10/02 0700 In: 1110.5 [I.V.:1110.5] Out: 675 [Urine:675] Intake/Output from this shift: Total I/O In: -  Out: 300 [Urine:300]  Physical Exam: Neck: no adenopathy, no carotid bruit, no JVD and supple, symmetrical, trachea midline Lungs:  Decreased breath sound at bases Heart: regular rate and rhythm, S1, S2 normal and Soft systolic murmur noted Abdomen: soft, non-tender; bowel sounds normal; no masses,  no organomegaly Extremities: extremities normal, atraumatic, no cyanosis or edema  Lab Results:  Basename 10/12/11 0538 10/11/11 0510  WBC 7.0 6.9  HGB 9.6* 9.5*  PLT 243 205    Basename 10/12/11 0538 10/11/11 0510  NA 131* 131*  K 3.8 3.6  CL 93* 94*  CO2 27 31  GLUCOSE 92 105*  BUN 6 7  CREATININE 1.03 1.03   No results found for this basename: TROPONINI:2,CK,MB:2 in the last 72 hours Hepatic Function Panel No results found for this basename: PROT,ALBUMIN,AST,ALT,ALKPHOS,BILITOT,BILIDIR,IBILI in the last 72 hours No results found for this basename: CHOL in the last 72 hours No results found for this basename: PROTIME in the last 72 hours  Imaging: Imaging results have been reviewed and Ct Angio Chest Aorta W/cm &/or Wo/cm  10/12/2011  **ADDENDUM** CREATED: 10/12/2011 20:25:21  I just spoke with Dr. Myra Gianotti by telephone.  The patient is having and hemoptysis there is clinical concern  for aorto-bronchial fistula.  Looking through the images again, there are lobar and segmental bronchi to the left lower lobe that are in very close proximity to the anterior wall of the aneurysm.  These track through the atelectatic compressed lung of the left lower lobe.  I can identify no definite findings that would pinpoint an aorto- bronchial fistula.  No unexpected gas collection is evident.  The patient's esophagus tracks down along the anteromedial aspect of the aneurysm.  There is no evidence of wall thickening in the distal esophagus and there is no fluid in the esophagus adjacent to the aneurysm.  .**END ADDENDUM** SIGNED BY: Lorenda Ishihara. Molli Posey, M.D.   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  CT ANGIOGRAPHY CHEST, ABDOMEN AND PELVIS  Technique:  Multidetector CT imaging through the chest, abdomen and pelvis was performed using the standard protocol during bolus administration of intravenous contrast.  Multiplanar reconstructed images including MIPs were obtained and reviewed to evaluate the vascular anatomy.  Contrast: OMNIPAQUE IOHEXOL 350 MG/ML SOLN  Comparison:  CT chest from 10/09/2011  CTA CHEST  Findings:  As seen on the recent CT scan, the patient has a markedly aneurysmal tight be thoracoabdominal aortic dissection. Ascending aorta is stable and 3.9 cm in diameter compared 4.1 cm previously.  Dissection flap begins in the region of the proximal descending segment of the thoracic aorta.  Opacified blood is seen flowing into the false lumen, as  before.  There is a patent channel in the largely thrombosed false lumen which tracks caudally into the abdomen.  There is poor flow within this false lumen channel, but it does ultimately perfused the left kidney, all be it to a decreased extent.  Measuring at the same level as on the previous study, the maximum diameter of the descending aneurysm is  10.3 x 11.6 cm today compared 10.9 x 12.0 cm previously.  There is some compressive atelectasis overlying the aneurysm sac and a tiny amount of pleural fluid is stable in the interval.   Review of the MIP images confirms the above findings.  IMPRESSION: Stable CT of the chest.  Massive for abdominal aortic aneurysm or dissection.  The false lumen compresses the true lumen as before and there is channel of poor flow through the largely thrombosed false lumen then tracks caudally to perfuse the left kidney.  CTA ABDOMEN AND PELVIS  Findings:  The aneurysm tapers through the diaphragmatic hiatus and the aorta measures 5.5 x 4.8 cm in orthogonal diameters at the level of the celiac axis.  The celiac axis is perfused off the true lumen.  The SMA is perfused off the true lumen and the right renal artery arises from the true lumen.  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  The aorta measures 4.1 x 4.8 cm just proximal to the bifurcation and the dissection extends into the left common iliac artery with dissection flap seen extending into the left external iliac artery.  No evidence for propagation of the dissection into the left common femoral artery. 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  No focal abnormalities seen in the liver or spleen.  The stomach, duodenum, pancreas, and adrenal glands are unremarkable.  Calcified stones are noted in the lumen of the gallbladder.  No intra or extrahepatic biliary duct dilatation.  Right kidney is unremarkable.  5.0 cm cystic lesion identified in the posterior upper pole of the left kidney.  This measures water attenuation and is probably a cyst.  12 mm low density lesion in the posterior right kidney is also likely a cyst, but cannot be definitively characterized.  No free fluid or lymphadenopathy in the abdomen.  An umbilical hernia contains only fat.  Imaging through the pelvis shows no  free intraperitoneal fluid. Bladder is unremarkable.  Prostate gland is enlarged.  Diverticular changes are seen in the sigmoid colon without diverticulitis. Terminal ileum and appendix are normal.  Bone windows reveal no worrisome lytic or sclerotic osseous lesions.   Review of the MIP images confirms the above findings.  IMPRESSION: Thoracoabdominal aortic aneurysm with dissection extending into the left common and external iliac arteries.  Left kidney shows decreased perfusion with left renal artery arising from a poorly perfused channel in the largely thrombosed false lumen.  The IMA also arises from the false lumen shows no opacification proximally suggesting thrombosis.   Original Report Authenticated By: ERIC A. MANSELL, M.D.    Ct Angio Abd/pel W/ And/or W/o  10/12/2011  **ADDENDUM** CREATED: 10/12/2011 20:25:21  I just spoke with Dr. Myra Gianotti by telephone.  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  Looking through the images again, there are lobar and segmental bronchi to the left lower lobe that are in very close proximity to the anterior wall of the aneurysm.  These track through the atelectatic compressed lung  of the left lower lobe.  I can identify no definite findings that would pinpoint an aorto- bronchial fistula.  No unexpected gas collection is evident.  The patient's esophagus tracks down along the anteromedial aspect of the aneurysm.  There is no evidence of wall thickening in the distal esophagus and there is no fluid in the esophagus adjacent to the aneurysm.  .**END ADDENDUM** SIGNED BY: Lorenda Ishihara. Molli Posey, M.D.   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  CT ANGIOGRAPHY CHEST, ABDOMEN AND PELVIS  Technique:  Multidetector CT imaging through the chest, abdomen and pelvis was performed using the standard protocol during bolus administration of  intravenous contrast.  Multiplanar reconstructed images including MIPs were obtained and reviewed to evaluate the vascular anatomy.  Contrast: OMNIPAQUE IOHEXOL 350 MG/ML SOLN  Comparison:  CT chest from 10/09/2011  CTA CHEST  Findings:  As seen on the recent CT scan, the patient has a markedly aneurysmal tight be thoracoabdominal aortic dissection. Ascending aorta is stable and 3.9 cm in diameter compared 4.1 cm previously.  Dissection flap begins in the region of the proximal descending segment of the thoracic aorta.  Opacified blood is seen flowing into the false lumen, as before.  There is a patent channel in the largely thrombosed false lumen which tracks caudally into the abdomen.  There is poor flow within this false lumen channel, but it does ultimately perfused the left kidney, all be it to a decreased extent.  Measuring at the same level as on the previous study, the maximum diameter of the descending aneurysm is 10.3 x 11.6 cm today compared 10.9 x 12.0 cm previously.  There is some compressive atelectasis overlying the aneurysm sac and a tiny amount of pleural fluid is stable in the interval.   Review of the MIP images confirms the above findings.  IMPRESSION: Stable CT of the chest.  Massive for abdominal aortic aneurysm or dissection.  The false lumen compresses the true lumen as before and there is channel of poor flow through the largely thrombosed false lumen then tracks caudally to perfuse the left kidney.  CTA ABDOMEN AND PELVIS  Findings:  The aneurysm tapers through the diaphragmatic hiatus and the aorta measures 5.5 x 4.8 cm in orthogonal diameters at the level of the celiac axis.  The celiac axis is perfused off the true lumen.  The SMA is perfused off the true lumen and the right renal artery arises from the true lumen.  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  The aorta measures 4.1 x 4.8 cm just proximal to the bifurcation and the dissection extends  into the left common iliac artery with dissection flap seen extending into the left external iliac artery.  No evidence for propagation of the dissection into the left common femoral artery. 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  No focal abnormalities seen in the liver or spleen.  The stomach, duodenum, pancreas, and adrenal glands are unremarkable.  Calcified stones are noted in the lumen of the gallbladder.  No intra or extrahepatic biliary duct dilatation.  Right kidney is unremarkable.  5.0 cm cystic lesion identified in the posterior upper pole of the left kidney.  This measures water attenuation and is probably a cyst.  12 mm low density lesion in the posterior right kidney is also  likely a cyst, but cannot be definitively characterized.  No free fluid or lymphadenopathy in the abdomen.  An umbilical hernia contains only fat.  Imaging through the pelvis shows no free intraperitoneal fluid. Bladder is unremarkable.  Prostate gland is enlarged.  Diverticular changes are seen in the sigmoid colon without diverticulitis. Terminal ileum and appendix are normal.  Bone windows reveal no worrisome lytic or sclerotic osseous lesions.   Review of the MIP images confirms the above findings.  IMPRESSION: Thoracoabdominal aortic aneurysm with dissection extending into the left common and external iliac arteries.  Left kidney shows decreased perfusion with left renal artery arising from a poorly perfused channel in the largely thrombosed false lumen.  The IMA also arises from the false lumen shows no opacification proximally suggesting thrombosis.   Original Report Authenticated By: ERIC A. MANSELL, M.D.     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

## 2011-10-13 NOTE — Progress Notes (Signed)
Patient ID: Jesse Le, male   DOB: November 06, 1945, 66 y.o.   MRN: [id number]                    [address]            [name]          [phone]       [facility] Medical Record #[id number] Date of Birth: May 16, 1945  Referring: No ref. provider found Primary Care: Robynn Pane, MD  Chief Complaint:    Chief Complaint  Patient presents with  . Hemoptysis    History of Present Illness:     66 year old Afro-American male smoker with a history of alcohol abuse presents with a one-day history of dark blood hemoptysis. No history of trauma. No history of chest pain. Patient is a heavy smoker. No prior history of hemoptysis. No history of bleeding disorder blood per rectum or upper GI bleeding. 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 This appears to be a type B dissection with a false lumen enlarged up to 11 cm but mainly thrombosed. There is some extrinsic distortion of the esophagus.  The diameter of the aorta at the diaphragmatic hiatus is 5.8 cm.  The patient denies any specific events of severe chest pain. He denies shortness of breath.  The hemoptysis is dark blood and has been mild.    Current Activity/ Functional Status: Patient is independent with mobility/ambulation, transfers, ADL's, IADL's. sedentary  Past Medical History  Diagnosis Date  . Coronary artery disease   . Hypertension   . Myocardial infarct     History reviewed. No pertinent past surgical history.  History  Smoking status  . Current Every Day Smoker -- 0.5 packs/day  . Types: Cigarettes  Smokeless tobacco  . Not on file    History  Alcohol Use  . Yes    weekends    History   Social History  . Marital Status: Married    Spouse Name: N/A    Number of Children: N/A  . Years of Education: N/A   Occupational History  . Not working   Social  History Main Topics  . Smoking status: Current Every Day Smoker -- 0.5 packs/day    Types: Cigarettes  . Smokeless tobacco: Not on file  . Alcohol Use: Yes     weekends  . Drug Use: No  . Sexually Active:     No Known Allergies  Current Facility-Administered Medications  Medication Dose Route Frequency Provider Last Rate Last Dose  . amoxicillin-clavulanate (AUGMENTIN) 875-125 MG per tablet 1 tablet  1 tablet Oral BID Robynn Pane, MD   1 tablet at 10/13/11 1125  . budesonide-formoterol (SYMBICORT) 160-4.5 MCG/ACT inhaler 2 puff  2 puff Inhalation BID Kerin Perna, MD   2 puff at 10/13/11 0905  . chlorpheniramine-HYDROcodone (TUSSIONEX) 10-8 MG/5ML suspension 5 mL  5 mL Oral BID Kerin Perna, MD   5 mL at 10/13/11 0853  . dextrose 5 % and 0.45 % NaCl with KCl 20 mEq/L infusion   Intravenous Continuous Ricki Rodriguez, MD 30 mL/hr at 10/13/11 1409 30 mL/hr at 10/13/11 1409  . docusate sodium (COLACE) capsule 100 mg  100 mg Oral BID Ricki Rodriguez, MD   100 mg at 10/13/11 1125  . guaiFENesin (ROBITUSSIN) 100 MG/5ML solution 200 mg  10 mL Oral Q4H PRN Ajay S  Algie Coffer, MD   200 mg at 10/12/11 1758  . HYDROcodone-acetaminophen (NORCO/VICODIN) 5-325 MG per tablet 1-2 tablet  1-2 tablet Oral Q4H PRN Ricki Rodriguez, MD      . labetalol (NORMODYNE,TRANDATE) injection 10 mg  10 mg Intravenous Q2H PRN Kerin Perna, MD      . lisinopril (PRINIVIL,ZESTRIL) tablet 40 mg  40 mg Oral BID Ricki Rodriguez, MD   40 mg at 10/13/11 1125  . metoprolol succinate (TOPROL-XL) 24 hr tablet 100 mg  100 mg Oral Q12H Ricki Rodriguez, MD   100 mg at 10/13/11 0926  . nitroGLYCERIN (NITRODUR - Dosed in mg/24 hr) patch 0.4 mg  0.4 mg Transdermal Daily Ricki Rodriguez, MD   0.4 mg at 10/13/11 1127  . ondansetron (ZOFRAN) tablet 4 mg  4 mg Oral Q6H PRN Ricki Rodriguez, MD       Or  . ondansetron (ZOFRAN) injection 4 mg  4 mg Intravenous Q6H PRN Ricki Rodriguez, MD      . simvastatin (ZOCOR) tablet 20 mg  20 mg Oral  QPM Ricki Rodriguez, MD   20 mg at 10/13/11 1759  . sodium chloride 0.9 % injection 3 mL  3 mL Intravenous Q12H Ricki Rodriguez, MD   3 mL at 10/12/11 2156  . DISCONTD: metoprolol succinate (TOPROL-XL) 24 hr tablet 100 mg  100 mg Oral BID AC Robynn Pane, MD   100 mg at 10/12/11 1757    Prescriptions prior to admission  Medication Sig Dispense Refill  . aspirin EC 81 MG tablet Take 81 mg by mouth daily.      Marland Kitchen atenolol (TENORMIN) 50 MG tablet Take 50 mg by mouth 2 (two) times daily.      Marland Kitchen lisinopril (PRINIVIL,ZESTRIL) 40 MG tablet Take 40 mg by mouth 2 (two) times daily.      . simvastatin (ZOCOR) 20 MG tablet Take 20 mg by mouth every evening.        No family history on file.   Review of Systems:     Cardiac Review of Systems: Y or N  Chest Pain [   n ]  Resting SOB [  y ] Exertional SOB  Cove.Etienne  ]  Orthopnea [  ]   Pedal Edema [ y  ]    Palpitations [  ] Syncope  [ n ]   Presyncope [ n  ]  General Review of Systems: [Y] = yes [  ]=no Constitional: recent weight change [n  ]; anorexia [  ]; fatigue [  ]; nausea [  ]; night sweats [  ]; fever [  ]; or chills [  ];                                                                                                                                          Dental: poor  dentition[ y ]; Last Dentist visit: never  Eye : blurred vision [  ]; diplopia [   ]; vision changes [  ];  Amaurosis fugax[  ]; Resp: cough [  ];  wheezing[y  ];  hemoptysis[ yes ]; shortness of breath[ y ]; paroxysmal nocturnal dyspnea[ y ]; dyspnea on exertion[  ]; or orthopnea[  ];  GI:  gallstones[  ], vomiting[  ];  dysphagia[  ]; melena[  ];  hematochezia [  ]; heartburn[  ];   Hx of  Colonoscopy[  ]; GU: kidney stones [  ]; hematuria[  ];   dysuria [  ];  nocturia[  ];  history of     obstruction [  ];             Skin: rash, swelling[  ];, hair loss[  ];  peripheral edema[  ];  or itching[  ]; Musculosketetal: myalgias[  ];  joint swelling[  ];  joint erythema[  ];  joint  pain[  ];  back pain[  ];  Heme/Lymph: bruising[  ];  bleeding[  ];  anemia[  ];  Neuro: TIA[  ];  headaches[  ];  stroke[  ];  vertigo[  ];  seizures[  ];   paresthesias[  ];  difficulty walking[  ];  Psych:depression[  ]; anxiety[  ];  Endocrine: diabetes[  ];  thyroid dysfunction[  ];  Immunizations: Flu [  ]; Pneumococcal[  ];  Other:  Physical Exam: BP 121/84  Pulse 75  Temp 98 F (36.7 C) (Oral)  Resp 27  Ht 6\' 2"  (1.88 m)  Wt 256 lb 13.4 oz (116.5 kg)  BMI 32.98 kg/m2  SpO2 97%  General appearance: alert, cooperative and appears older than stated age Neurologic: intact Heart: regular rate and rhythm, S1, S2 normal, no murmur, click, rub or gallop and normal apical impulse Lungs: diminished breath sounds bilaterally Abdomen: soft, non-tender; bowel sounds normal; no masses,  no organomegaly Extremities: Homans sign is negative, no sign of DVT, varicose veins noted and venous stasis dermatitis noted Palpable pulses at pt and dp bi;aterial No carotid  bruits  Diagnostic Studies & Laboratory data:     Recent Radiology Findings:   Ct Angio Chest Aorta W/cm &/or Wo/cm  10/12/2011  **ADDENDUM** CREATED: 10/12/2011 20:25:21  I just spoke with Dr. Myra Gianotti by telephone.  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  Looking through the images again, there are lobar and segmental bronchi to the left lower lobe that are in very close proximity to the anterior wall of the aneurysm.  These track through the atelectatic compressed lung of the left lower lobe.  I can identify no definite findings that would pinpoint an aorto- bronchial fistula.  No unexpected gas collection is evident.  The patient's esophagus tracks down along the anteromedial aspect of the aneurysm.  There is no evidence of wall thickening in the distal esophagus and there is no fluid in the esophagus adjacent to the aneurysm.  .**END ADDENDUM** SIGNED BY: Lorenda Ishihara. Molli Posey, M.D.     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  CT ANGIOGRAPHY CHEST, ABDOMEN AND PELVIS  Technique:  Multidetector CT imaging through the chest, abdomen and pelvis was performed using the standard protocol during bolus administration of intravenous contrast.  Multiplanar reconstructed images including MIPs were obtained and reviewed to evaluate the vascular anatomy.  Contrast: OMNIPAQUE IOHEXOL 350 MG/ML SOLN  Comparison:  CT chest from 10/09/2011  CTA CHEST  Findings:  As seen on the recent CT scan, the patient has a markedly aneurysmal tight be thoracoabdominal aortic dissection. Ascending aorta is stable and 3.9 cm in diameter compared 4.1 cm previously.  Dissection flap begins in the region of the proximal descending segment of the thoracic aorta.  Opacified blood is seen flowing into the false lumen, as before.  There is a patent channel in the largely thrombosed false lumen which tracks caudally into the abdomen.  There is poor flow within this false lumen channel, but it does ultimately perfused the left kidney, all be it to a decreased extent.  Measuring at the same level as on the previous study, the maximum diameter of the descending aneurysm is 10.3 x 11.6 cm today compared 10.9 x 12.0 cm previously.  There is some compressive atelectasis overlying the aneurysm sac and a tiny amount of pleural fluid is stable in the interval.   Review of the MIP images confirms the above findings.  IMPRESSION: Stable CT of the chest.  Massive for abdominal aortic aneurysm or dissection.  The false lumen compresses the true lumen as before and there is channel of poor flow through the largely thrombosed false lumen then tracks caudally to perfuse the left kidney.  CTA ABDOMEN AND PELVIS  Findings:  The aneurysm tapers through the diaphragmatic hiatus and the aorta measures 5.5 x 4.8 cm in orthogonal diameters at the  level of the celiac axis.  The celiac axis is perfused off the true lumen.  The SMA is perfused off the true lumen and the right renal artery arises from the true lumen.  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  The aorta measures 4.1 x 4.8 cm just proximal to the bifurcation and the dissection extends into the left common iliac artery with dissection flap seen extending into the left external iliac artery.  No evidence for propagation of the dissection into the left common femoral artery. 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  No focal abnormalities seen in the liver or spleen.  The stomach, duodenum, pancreas, and adrenal glands are unremarkable.  Calcified stones are noted in the lumen of the gallbladder.  No intra or extrahepatic biliary duct dilatation.  Right kidney is unremarkable.  5.0 cm cystic lesion identified in the posterior upper pole of the left kidney.  This measures water attenuation and is probably a cyst.  12 mm low density lesion in the posterior right kidney is also likely a cyst, but cannot be definitively characterized.  No free fluid or lymphadenopathy in the abdomen.  An umbilical hernia contains only fat.  Imaging through the pelvis shows no free intraperitoneal fluid. Bladder is unremarkable.  Prostate gland is enlarged.  Diverticular changes are seen in the sigmoid colon without diverticulitis. Terminal ileum and appendix are normal.  Bone windows reveal no worrisome lytic or sclerotic osseous lesions.   Review of the MIP images confirms the above findings.  IMPRESSION: Thoracoabdominal aortic aneurysm with dissection extending into the left common and external iliac arteries.  Left kidney shows decreased perfusion with left renal artery arising from a poorly perfused channel in the largely thrombosed false lumen.  The IMA also arises from the false lumen shows no opacification proximally  suggesting thrombosis.   Original Report Authenticated By: ERIC A. MANSELL,  M.D.    Ct Angio Abd/pel W/ And/or W/o  10/12/2011  **ADDENDUM** CREATED: 10/12/2011 20:25:21  I just spoke with Dr. Myra Gianotti by telephone.  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  Looking through the images again, there are lobar and segmental bronchi to the left lower lobe that are in very close proximity to the anterior wall of the aneurysm.  These track through the atelectatic compressed lung of the left lower lobe.  I can identify no definite findings that would pinpoint an aorto- bronchial fistula.  No unexpected gas collection is evident.  The patient's esophagus tracks down along the anteromedial aspect of the aneurysm.  There is no evidence of wall thickening in the distal esophagus and there is no fluid in the esophagus adjacent to the aneurysm.  .**END ADDENDUM** SIGNED BY: Lorenda Ishihara. Molli Posey, M.D.   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  CT ANGIOGRAPHY CHEST, ABDOMEN AND PELVIS  Technique:  Multidetector CT imaging through the chest, abdomen and pelvis was performed using the standard protocol during bolus administration of intravenous contrast.  Multiplanar reconstructed images including MIPs were obtained and reviewed to evaluate the vascular anatomy.  Contrast: OMNIPAQUE IOHEXOL 350 MG/ML SOLN  Comparison:  CT chest from 10/09/2011  CTA CHEST  Findings:  As seen on the recent CT scan, the patient has a markedly aneurysmal tight be thoracoabdominal aortic dissection. Ascending aorta is stable and 3.9 cm in diameter compared 4.1 cm previously.  Dissection flap begins in the region of the proximal descending segment of the thoracic aorta.  Opacified blood is seen flowing into the false lumen, as before.  There is a patent channel in the largely thrombosed false lumen  which tracks caudally into the abdomen.  There is poor flow within this false lumen channel, but it does ultimately perfused the left kidney, all be it to a decreased extent.  Measuring at the same level as on the previous study, the maximum diameter of the descending aneurysm is 10.3 x 11.6 cm today compared 10.9 x 12.0 cm previously.  There is some compressive atelectasis overlying the aneurysm sac and a tiny amount of pleural fluid is stable in the interval.   Review of the MIP images confirms the above findings.  IMPRESSION: Stable CT of the chest.  Massive for abdominal aortic aneurysm or dissection.  The false lumen compresses the true lumen as before and there is channel of poor flow through the largely thrombosed false lumen then tracks caudally to perfuse the left kidney.  CTA ABDOMEN AND PELVIS  Findings:  The aneurysm tapers through the diaphragmatic hiatus and the aorta measures 5.5 x 4.8 cm in orthogonal diameters at the level of the celiac axis.  The celiac axis is perfused off the true lumen.  The SMA is perfused off the true lumen and the right renal artery arises from the true lumen.  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  The aorta measures 4.1 x 4.8 cm just proximal to the bifurcation and the dissection extends into the left common iliac artery with dissection flap seen extending into the left external iliac artery.  No evidence for propagation of the dissection into the left common femoral artery. 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  No focal abnormalities seen  in the liver or spleen.  The stomach, duodenum, pancreas, and adrenal glands are unremarkable.  Calcified stones are noted in the lumen of the gallbladder.  No intra or extrahepatic biliary duct dilatation.  Right kidney is unremarkable.  5.0 cm cystic lesion identified in the posterior upper pole of the left kidney.  This measures water  attenuation and is probably a cyst.  12 mm low density lesion in the posterior right kidney is also likely a cyst, but cannot be definitively characterized.  No free fluid or lymphadenopathy in the abdomen.  An umbilical hernia contains only fat.  Imaging through the pelvis shows no free intraperitoneal fluid. Bladder is unremarkable.  Prostate gland is enlarged.  Diverticular changes are seen in the sigmoid colon without diverticulitis. Terminal ileum and appendix are normal.  Bone windows reveal no worrisome lytic or sclerotic osseous lesions.   Review of the MIP images confirms the above findings.  IMPRESSION: Thoracoabdominal aortic aneurysm with dissection extending into the left common and external iliac arteries.  Left kidney shows decreased perfusion with left renal artery arising from a poorly perfused channel in the largely thrombosed false lumen.  The IMA also arises from the false lumen shows no opacification proximally suggesting thrombosis.   Original Report Authenticated By: ERIC A. MANSELL, M.D.       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 Ct scan has been reviewed and case discussed with Dr Myra Gianotti. With extent ofdilation patient is at very near term risk of rupture. He is not candidate  for open repair.  Thoracic Stent grafting  to decrease risk of rupture is offered to patient. The risk of death or complication from stenting is real and explained to patient and family.  The goals risks and alternatives of the planned surgical procedure thoracic stent grafting  have been discussed with the  patient in detail. The risks of the procedure including death, infection, stroke, myocardial infarction, bleeding, blood transfusion have all been discussed specifically.  I have quoted Jesse Le a 15 % of perioperative mortality and a complication rate as high as 40 %. The patient's questions have been answered.Jesse Le is willing  to proceed with the planned procedure.    Delight Ovens MD  Beeper [phone] Office [phone] 10/13/2011 6:47 PM

## 2011-10-13 NOTE — Progress Notes (Signed)
[facility]  Subjective  -  No more hemoptysis No chest pain or shortness of breath   Physical Exam:  Gen:  NAD Pulm:  Non-labored breathing Abd:  Soft, non-tender Ext:  Palpable pedal and femoral pulses       Assessment/Plan:  I have reviewed his CTA of the chest, abdomen, and pelvis extensively.  A segmental bronchus is in close proximity to the anterior surface of his aneurysm and I suspect this is the cause of his hemoptysis.  He has no CT signs of esophageal varices.  I reviewed our treatment options with the patient and his family at the bedside.  Our options include palliative care, which will likely result in death either from worsening hemoptysis or rupture of his large aneurysm.  Or, we can proceed with endovascular repair.  He is not a candidate for open repair.  I discussed the risks and benefits with him and he wishes to proceed with repair.  We specifically discussed the risk of paralysis, graft infection, and the potential need for future interventions.  We also discussed the risk of cardiopulmonary complications.  He has been scheduled for tomorrow morning. I spent 1 hour reviewing the CT scan and planning his procedure.  30 minutes was face to face with the patient and family.  Jesse Le IV, V. WELLS 10/13/2011 6:41 PM --  Filed Vitals:   10/13/11 1542  BP: 121/84  Pulse:   Temp: 98 F (36.7 C)  Resp:     Intake/Output Summary (Last 24 hours) at 10/13/11 1841 Last data filed at 10/13/11 1542  Gross per 24 hour  Intake 1107.5 ml  Output    975 ml  Net  132.5 ml     Laboratory CBC    Component Value Date/Time   WBC 7.1 10/13/2011 1650   HGB 9.4* 10/13/2011 1650   HCT 29.8* 10/13/2011 1650   PLT 293 10/13/2011 1650    BMET    Component Value Date/Time   NA 130* 10/13/2011 1650   K 4.0 10/13/2011 1650   CL 95* 10/13/2011 1650   CO2 28 10/13/2011 1650   GLUCOSE 110* 10/13/2011 1650   BUN 5* 10/13/2011 1650   CREATININE  0.98 10/13/2011 1650   CALCIUM 9.3 10/13/2011 1650   GFRNONAA 84* 10/13/2011 1650   GFRAA >90 10/13/2011 1650    COAG Lab Results  Component Value Date   INR 1.27 10/13/2011   INR 1.33 10/10/2011   INR 1.31 10/09/2011   No results found for this basename: PTT    Antibiotics Anti-infectives     Start     Dose/Rate Route Frequency Ordered Stop   10/11/11 1300   amoxicillin-clavulanate (AUGMENTIN) 875-125 MG per tablet 1 tablet        1 tablet Oral 2 times daily 10/11/11 1238             V. [place] Jesse Le IV, M.D. [facility]  Office: [phone] Pager:  [phone]  

## 2011-10-13 NOTE — Plan of Care (Signed)
Problem: Consults Goal: Diagnosis CEA/CES/AAA Stent Abdominal Aortic Aneurysm Stent (AAA)yes

## 2011-10-14 ENCOUNTER — Encounter (HOSPITAL_COMMUNITY): Payer: Self-pay | Admitting: Anesthesiology

## 2011-10-14 ENCOUNTER — Inpatient Hospital Stay (HOSPITAL_COMMUNITY): Payer: Medicare Other

## 2011-10-14 ENCOUNTER — Inpatient Hospital Stay (HOSPITAL_COMMUNITY): Payer: Medicare Other | Admitting: Anesthesiology

## 2011-10-14 ENCOUNTER — Encounter (HOSPITAL_COMMUNITY)
Admission: EM | Disposition: A | Discharge status: Home / Self Care | Payer: Self-pay | Source: Home / Self Care | Attending: Cardiothoracic Surgery

## 2011-10-14 DIAGNOSIS — I716 Thoracoabdominal aortic aneurysm, without rupture: Secondary | ICD-10-CM

## 2011-10-14 DIAGNOSIS — R042 Hemoptysis: Secondary | ICD-10-CM

## 2011-10-14 HISTORY — PX: ABDOMINAL AORTIC ANEURYSM REPAIR: SUR1152

## 2011-10-14 HISTORY — PX: INTRAVASCULAR ULTRASOUND: SHX5452

## 2011-10-14 HISTORY — PX: VIDEO BRONCHOSCOPY: SHX5072

## 2011-10-14 LAB — CBC
Platelets: 221 10*3/uL (ref 150–400)
RDW: 14.5 % (ref 11.5–15.5)
WBC: 8.2 10*3/uL (ref 4.0–10.5)

## 2011-10-14 LAB — BASIC METABOLIC PANEL
Chloride: 99 mEq/L (ref 96–112)
Creatinine, Ser: 0.94 mg/dL (ref 0.50–1.35)
GFR calc Af Amer: >90 mL/min (ref >90)
Potassium: 4.1 mEq/L (ref 3.5–5.1)
Sodium: 132 mEq/L — ABNORMAL LOW (ref 135–145)

## 2011-10-14 LAB — APTT: aPTT: 33 seconds (ref 24–37)

## 2011-10-14 LAB — MAGNESIUM: Magnesium: 1.9 mg/dL (ref 1.5–2.5)

## 2011-10-14 LAB — PROTIME-INR
INR: 1.42 (ref 0.00–1.49)
Prothrombin Time: 17 seconds — ABNORMAL HIGH (ref 11.6–15.2)

## 2011-10-14 SURGERY — INSERTION, ENDOVASCULAR STENT GRAFT, AORTA, THORACIC
Anesthesia: General | Wound class: Clean

## 2011-10-14 MED ORDER — NEOSTIGMINE METHYLSULFATE 1 MG/ML IJ SOLN
INTRAMUSCULAR | Status: DC | PRN
  Administered 2011-10-14: 4 mg via INTRAVENOUS

## 2011-10-14 MED ORDER — IODIXANOL 320 MG/ML IV SOLN
INTRAVENOUS | Status: DC | PRN
  Administered 2011-10-14: 150 mL via INTRA_ARTERIAL

## 2011-10-14 MED ORDER — ONDANSETRON HCL 4 MG/2ML IJ SOLN
4.0000 mg | Freq: Four times a day (QID) | INTRAMUSCULAR | Status: DC | PRN
  Administered 2011-10-16: 4 mg via INTRAVENOUS
  Filled 2011-10-14 (×2): qty 2

## 2011-10-14 MED ORDER — FENTANYL CITRATE 0.05 MG/ML IJ SOLN
INTRAMUSCULAR | Status: AC
  Filled 2011-10-14: qty 2

## 2011-10-14 MED ORDER — LIDOCAINE HCL (CARDIAC) 20 MG/ML IV SOLN
INTRAVENOUS | Status: DC | PRN
  Administered 2011-10-14: 50 mg via INTRAVENOUS

## 2011-10-14 MED ORDER — HYDRALAZINE HCL 20 MG/ML IJ SOLN
10.0000 mg | INTRAMUSCULAR | Status: DC | PRN
  Filled 2011-10-14: qty 0.5

## 2011-10-14 MED ORDER — GLYCOPYRROLATE 0.2 MG/ML IJ SOLN
0.2000 mg | Freq: Once | INTRAMUSCULAR | Status: DC
  Filled 2011-10-14: qty 1

## 2011-10-14 MED ORDER — ROCURONIUM BROMIDE 100 MG/10ML IV SOLN
INTRAVENOUS | Status: DC | PRN
  Administered 2011-10-14: 50 mg via INTRAVENOUS
  Administered 2011-10-14: 20 mg via INTRAVENOUS
  Administered 2011-10-14: 5 mg via INTRAVENOUS

## 2011-10-14 MED ORDER — GUAIFENESIN-DM 100-10 MG/5ML PO SYRP
15.0000 mL | ORAL_SOLUTION | ORAL | Status: DC | PRN
  Administered 2011-10-16: 15 mL via ORAL
  Filled 2011-10-14: qty 15

## 2011-10-14 MED ORDER — PHENOL 1.4 % MT LIQD: 1.0000 | OROMUCOSAL | Status: DC | PRN

## 2011-10-14 MED ORDER — SODIUM CHLORIDE 0.9 % IV SOLN: 500.0000 mL | Freq: Once | INTRAVENOUS | Status: AC | PRN

## 2011-10-14 MED ORDER — PROMETHAZINE HCL 25 MG/ML IJ SOLN: 6.2500 mg | INTRAMUSCULAR | Status: DC | PRN

## 2011-10-14 MED ORDER — SUCCINYLCHOLINE CHLORIDE 20 MG/ML IJ SOLN
INTRAMUSCULAR | Status: DC | PRN
  Administered 2011-10-14: 120 mg via INTRAVENOUS

## 2011-10-14 MED ORDER — ACETAMINOPHEN 650 MG RE SUPP: 325.0000 mg | RECTAL | Status: DC | PRN

## 2011-10-14 MED ORDER — DOCUSATE SODIUM 100 MG PO CAPS: 100.0000 mg | ORAL_CAPSULE | Freq: Every day | ORAL | Status: DC

## 2011-10-14 MED ORDER — ONDANSETRON HCL 4 MG/2ML IJ SOLN
INTRAMUSCULAR | Status: DC | PRN
  Administered 2011-10-14: 4 mg via INTRAVENOUS

## 2011-10-14 MED ORDER — LACTATED RINGERS IV SOLN
INTRAVENOUS | Status: DC | PRN
  Administered 2011-10-14: 07:00:00 via INTRAVENOUS

## 2011-10-14 MED ORDER — LABETALOL HCL 5 MG/ML IV SOLN: 10.0000 mg | INTRAVENOUS | Status: DC | PRN

## 2011-10-14 MED ORDER — OXYCODONE HCL 5 MG/5ML PO SOLN: 5.0000 mg | Freq: Once | ORAL | Status: AC | PRN

## 2011-10-14 MED ORDER — MEPERIDINE HCL 25 MG/ML IJ SOLN: 6.2500 mg | INTRAMUSCULAR | Status: DC | PRN

## 2011-10-14 MED ORDER — PANTOPRAZOLE SODIUM 40 MG PO TBEC
40.0000 mg | DELAYED_RELEASE_TABLET | Freq: Every day | ORAL | Status: DC
  Administered 2011-10-15 – 2011-10-21 (×7): 40 mg via ORAL
  Filled 2011-10-14 (×7): qty 1

## 2011-10-14 MED ORDER — OXYCODONE HCL 5 MG PO TABS
5.0000 mg | ORAL_TABLET | ORAL | Status: DC | PRN
  Administered 2011-10-16: 10 mg via ORAL
  Filled 2011-10-14: qty 2

## 2011-10-14 MED ORDER — GLYCOPYRROLATE 0.2 MG/ML IJ SOLN
INTRAMUSCULAR | Status: DC | PRN
  Administered 2011-10-14: .6 mg via INTRAVENOUS

## 2011-10-14 MED ORDER — OXYCODONE HCL 5 MG PO TABS: 5.0000 mg | ORAL_TABLET | Freq: Once | ORAL | Status: AC | PRN

## 2011-10-14 MED ORDER — DEXTROSE 5 % IV SOLN
1.5000 g | Freq: Two times a day (BID) | INTRAVENOUS | Status: AC
  Administered 2011-10-14 – 2011-10-15 (×2): 1.5 g via INTRAVENOUS
  Filled 2011-10-14 (×4): qty 1.5

## 2011-10-14 MED ORDER — ALUM & MAG HYDROXIDE-SIMETH 200-200-20 MG/5ML PO SUSP
15.0000 mL | ORAL | Status: DC | PRN
  Administered 2011-10-15 (×2): 30 mL via ORAL
  Filled 2011-10-14 (×3): qty 30

## 2011-10-14 MED ORDER — SODIUM CHLORIDE 0.9 % IR SOLN
Status: DC | PRN
  Administered 2011-10-14: 10:00:00

## 2011-10-14 MED ORDER — HYDROMORPHONE HCL PF 1 MG/ML IJ SOLN
0.2500 mg | INTRAMUSCULAR | Status: DC | PRN
  Administered 2011-10-14: 0.25 mg via INTRAVENOUS

## 2011-10-14 MED ORDER — HYDROMORPHONE HCL PF 1 MG/ML IJ SOLN
INTRAMUSCULAR | Status: AC
  Administered 2011-10-14: 0.25 mg via INTRAVENOUS
  Filled 2011-10-14: qty 1

## 2011-10-14 MED ORDER — HEPARIN SODIUM (PORCINE) 1000 UNIT/ML IJ SOLN
INTRAMUSCULAR | Status: DC | PRN
  Administered 2011-10-14: 8000 [IU] via INTRAVENOUS
  Administered 2011-10-14: 2000 [IU] via INTRAVENOUS

## 2011-10-14 MED ORDER — MORPHINE SULFATE 2 MG/ML IJ SOLN
2.0000 mg | INTRAMUSCULAR | Status: DC | PRN
  Administered 2011-10-14 – 2011-10-15 (×2): 4 mg via INTRAVENOUS
  Administered 2011-10-16 – 2011-10-17 (×2): 2 mg via INTRAVENOUS
  Filled 2011-10-14 (×2): qty 2
  Filled 2011-10-14 (×2): qty 1

## 2011-10-14 MED ORDER — MAGNESIUM SULFATE 40 MG/ML IJ SOLN: 2.0000 g | Freq: Once | INTRAMUSCULAR | Status: AC | PRN

## 2011-10-14 MED ORDER — ACETAMINOPHEN 325 MG PO TABS
325.0000 mg | ORAL_TABLET | ORAL | Status: DC | PRN
  Administered 2011-10-16: 650 mg via ORAL
  Filled 2011-10-14: qty 2

## 2011-10-14 MED ORDER — PROPOFOL 10 MG/ML IV BOLUS
INTRAVENOUS | Status: DC | PRN
  Administered 2011-10-14: 150 mg via INTRAVENOUS

## 2011-10-14 MED ORDER — MIDAZOLAM HCL 5 MG/5ML IJ SOLN
INTRAMUSCULAR | Status: DC | PRN
  Administered 2011-10-14: 2 mg via INTRAVENOUS

## 2011-10-14 MED ORDER — MIDAZOLAM HCL 2 MG/2ML IJ SOLN
INTRAMUSCULAR | Status: AC
  Filled 2011-10-14: qty 2

## 2011-10-14 MED ORDER — DOPAMINE-DEXTROSE 3.2-5 MG/ML-% IV SOLN: 3.0000 ug/kg/min | INTRAVENOUS | Status: DC

## 2011-10-14 MED ORDER — POTASSIUM CHLORIDE CRYS ER 20 MEQ PO TBCR: 20.0000 meq | EXTENDED_RELEASE_TABLET | Freq: Once | ORAL | Status: AC | PRN

## 2011-10-14 MED ORDER — FENTANYL CITRATE 0.05 MG/ML IJ SOLN
INTRAMUSCULAR | Status: DC | PRN
  Administered 2011-10-14: 50 ug via INTRAVENOUS
  Administered 2011-10-14: 100 ug via INTRAVENOUS
  Administered 2011-10-14: 50 ug via INTRAVENOUS
  Administered 2011-10-14: 100 ug via INTRAVENOUS
  Administered 2011-10-14: 50 ug via INTRAVENOUS

## 2011-10-14 MED ORDER — PROTAMINE SULFATE 10 MG/ML IV SOLN
INTRAVENOUS | Status: DC | PRN
  Administered 2011-10-14: 50 mg via INTRAVENOUS

## 2011-10-14 MED ORDER — METOPROLOL TARTRATE 1 MG/ML IV SOLN: 2.0000 mg | INTRAVENOUS | Status: DC | PRN

## 2011-10-14 MED ORDER — SODIUM CHLORIDE 0.9 % IV SOLN
10.0000 mg | INTRAVENOUS | Status: DC | PRN
  Administered 2011-10-14: 20 ug/min via INTRAVENOUS

## 2011-10-14 MED ORDER — 0.9 % SODIUM CHLORIDE (POUR BTL) OPTIME
TOPICAL | Status: DC | PRN
  Administered 2011-10-14: 1000 mL

## 2011-10-14 SURGICAL SUPPLY — 89 items
ADH SKN CLS APL DERMABOND .7 (GAUZE/BANDAGES/DRESSINGS) ×1
APL SKNCLS STERI-STRIP NONHPOA (GAUZE/BANDAGES/DRESSINGS)
BAG BANDED W/RUBBER/TAPE 36X54 (MISCELLANEOUS) ×5 IMPLANT
BAG DECANTER FOR FLEXI CONT (MISCELLANEOUS) IMPLANT
BAG EQP BAND 135X91 W/RBR TAPE (MISCELLANEOUS) ×5
BAG SNAP BAND KOVER 36X36 (MISCELLANEOUS) ×2 IMPLANT
BALLN CODA OCL 2-9.0-35-120-3 (BALLOONS)
BALLOON COD OCL 2-9.0-35-120-3 (BALLOONS) ×1 IMPLANT
BENZOIN TINCTURE PRP APPL 2/3 (GAUZE/BANDAGES/DRESSINGS) ×1 IMPLANT
BLADE SURG 11 STRL SS (BLADE) ×1 IMPLANT
CANISTER SUCTION 2500CC (MISCELLANEOUS) ×2 IMPLANT
CATH ACCU-VU SIZ PIG 5F 100CM (CATHETERS) ×1 IMPLANT
CATH BALLN TRILOBE 26-42 (BALLOONS) ×1 IMPLANT
CATH BEACON 5.038 65CM KMP-01 (CATHETERS) ×1 IMPLANT
CATH VISIONS PV .035 IVUS (CATHETERS) ×1 IMPLANT
CLOTH BEACON ORANGE TIMEOUT ST (SAFETY) ×2 IMPLANT
COVER MAYO STAND STRL (DRAPES) ×2 IMPLANT
COVER SURGICAL LIGHT HANDLE (MISCELLANEOUS) ×4 IMPLANT
DERMABOND ADVANCED (GAUZE/BANDAGES/DRESSINGS) ×1
DERMABOND ADVANCED .7 DNX12 (GAUZE/BANDAGES/DRESSINGS) IMPLANT
DEVICE CLOSURE PERCLS PRGLD 6F (VASCULAR PRODUCTS) IMPLANT
DRAIN CHANNEL 10F 3/8 F FF (DRAIN) IMPLANT
DRAIN CHANNEL 10M FLAT 3/4 FLT (DRAIN) IMPLANT
DRAIN SUBARACHNOID (WOUND CARE) ×1 IMPLANT
DRAPE C-ARM 42X72 X-RAY (DRAPES) ×1 IMPLANT
DRAPE TABLE COVER HEAVY DUTY (DRAPES) ×2 IMPLANT
DRESSING OPSITE X SMALL 2X3 (GAUZE/BANDAGES/DRESSINGS) ×1 IMPLANT
DRYSEAL FLEXSHEATH 24FR 33CM (SHEATH) ×1
ELECT CAUTERY BLADE 6.4 (BLADE) ×2 IMPLANT
ELECT REM PT RETURN 9FT ADLT (ELECTROSURGICAL) ×4
ELECTRODE REM PT RTRN 9FT ADLT (ELECTROSURGICAL) ×2 IMPLANT
ENDOPROSTHESIS THORAC 34X34X15 (Endovascular Graft) IMPLANT
ENDOPROSTHESIS THORAC 37X37X15 (Endovascular Graft) IMPLANT
ENDOPROTH THORACIC 34X34X15 (Endovascular Graft) ×2 IMPLANT
ENDOPROTH THORACIC 37X37X15 (Endovascular Graft) ×2 IMPLANT
ENDOPROTH THORACIC 45X45X15 (Endovascular Graft) ×1 IMPLANT
EVACUATOR 3/16  PVC DRAIN (DRAIN)
EVACUATOR 3/16 PVC DRAIN (DRAIN) IMPLANT
EVACUATOR SILICONE 100CC (DRAIN) IMPLANT
GAUZE SPONGE 2X2 8PLY STRL LF (GAUZE/BANDAGES/DRESSINGS) IMPLANT
GLOVE BIO SURGEON STRL SZ 6.5 (GLOVE) ×4 IMPLANT
GLOVE BIOGEL PI IND STRL 6.5 (GLOVE) IMPLANT
GLOVE BIOGEL PI IND STRL 7.5 (GLOVE) IMPLANT
GLOVE BIOGEL PI INDICATOR 6.5 (GLOVE) ×3
GLOVE BIOGEL PI INDICATOR 7.5 (GLOVE) ×1
GLOVE ECLIPSE 7.5 STRL STRAW (GLOVE) ×3 IMPLANT
GLOVE SURG SS PI 7.0 STRL IVOR (GLOVE) ×2 IMPLANT
GLOVE SURG SS PI 7.5 STRL IVOR (GLOVE) ×1 IMPLANT
GOWN STRL NON-REIN LRG LVL3 (GOWN DISPOSABLE) ×6 IMPLANT
GOWN STRL REIN 2XL XLG LVL4 (GOWN DISPOSABLE) ×1 IMPLANT
GOWN STRL REIN XL XLG (GOWN DISPOSABLE) ×2 IMPLANT
HEMOSTAT SURGICEL 2X14 (HEMOSTASIS) IMPLANT
KIT BASIN OR (CUSTOM PROCEDURE TRAY) ×2 IMPLANT
KIT ROOM TURNOVER OR (KITS) ×2 IMPLANT
NAMIC PROTECTION STATION ×1 IMPLANT
NDL PERC 18GX7CM (NEEDLE) ×1 IMPLANT
NEEDLE PERC 18GX7CM (NEEDLE) ×2 IMPLANT
NS IRRIG 1000ML POUR BTL (IV SOLUTION) ×3 IMPLANT
PACK AORTA (CUSTOM PROCEDURE TRAY) ×2 IMPLANT
PAD ARMBOARD 7.5X6 YLW CONV (MISCELLANEOUS) ×4 IMPLANT
PENCIL BUTTON HOLSTER BLD 10FT (ELECTRODE) ×2 IMPLANT
PERCLOSE PROGLIDE 6F (VASCULAR PRODUCTS) ×4
SHEATH DRYSEAL FLEX 24FR 33CM (SHEATH) IMPLANT
SHIELD RADPAD SCOOP 12X17 (MISCELLANEOUS) ×2 IMPLANT
SPONGE GAUZE 2X2 STER 10/PKG (GAUZE/BANDAGES/DRESSINGS) ×1
SPONGE GAUZE 4X4 12PLY (GAUZE/BANDAGES/DRESSINGS) ×1 IMPLANT
SPONGE SURGIFOAM ABS GEL 100 (HEMOSTASIS) IMPLANT
STOPCOCK MORSE 400PSI 3WAY (MISCELLANEOUS) ×2 IMPLANT
STRIP CLOSURE SKIN 1/2X4 (GAUZE/BANDAGES/DRESSINGS) ×1 IMPLANT
SUT ETHILON 4 0 PS 2 18 (SUTURE) ×2 IMPLANT
SUT PROLENE 5 0 C 1 24 (SUTURE) ×1 IMPLANT
SUT PROLENE 6 0 C 1 30 (SUTURE) IMPLANT
SUT SILK 2 0 FS (SUTURE) ×2 IMPLANT
SUT VIC AB 2-0 CTX 36 (SUTURE) ×2 IMPLANT
SUT VIC AB 3-0 SH 27 (SUTURE)
SUT VIC AB 3-0 SH 27X BRD (SUTURE) ×2 IMPLANT
SUT VICRYL 4-0 PS2 18IN ABS (SUTURE) ×1 IMPLANT
SYR 20CC LL (SYRINGE) ×4 IMPLANT
SYR 30ML SLIP (SYRINGE) IMPLANT
SYR 5ML LL (SYRINGE) ×2 IMPLANT
SYR MEDRAD MARK V 150ML (SYRINGE) ×2 IMPLANT
SYRINGE 10CC LL (SYRINGE) ×6 IMPLANT
TOWEL OR 17X24 6PK STRL BLUE (TOWEL DISPOSABLE) ×3 IMPLANT
TOWEL OR 17X26 10 PK STRL BLUE (TOWEL DISPOSABLE) ×4 IMPLANT
TRAY FOLEY CATH 14FRSI W/METER (CATHETERS) ×2 IMPLANT
TUBING HIGH PRESSURE 120CM (CONNECTOR) ×2 IMPLANT
WATER STERILE IRR 1000ML POUR (IV SOLUTION) ×1 IMPLANT
WIRE BENTSON .035X145CM (WIRE) ×1 IMPLANT
WIRE STIFF LUNDERQUIST 260MM (WIRE) ×1 IMPLANT

## 2011-10-14 NOTE — Progress Notes (Signed)
Patient ID: Jesse Le, male   DOB: 12/23/1945, 66 y.o.   MRN: [id number]                   [address]            [place] [address]          [phone]     Day of Surgery Procedure(s) (LRB): THORACIC AORTIC ENDOVASCULAR STENT GRAFT (N/A) VIDEO BRONCHOSCOPY (N/A) INTRAVASCULAR ULTRASOUND (N/A)  Total Length of Stay:  LOS: 5 days  BP 145/101  Pulse 68  Temp 97.9 F (36.6 C) (Oral)  Resp 25  Ht 6\' 2"  (1.88 m)  Wt 256 lb 13.4 oz (116.5 kg)  BMI 32.98 kg/m2  SpO2 99%     . dextrose 5 % and 0.45 % NaCl with KCl 20 mEq/L 30 mL/hr at 10/13/11 1900  . DOPamine       Lab Results  Component Value Date   WBC 8.2 10/14/2011   HGB 9.0* 10/14/2011   HCT 28.2* 10/14/2011   PLT 221 10/14/2011   GLUCOSE 128* 10/14/2011   ALT 8 10/13/2011   AST 19 10/13/2011   NA 132* 10/14/2011   K 4.1 10/14/2011   CL 99 10/14/2011   CREATININE 0.94 10/14/2011   BUN 4* 10/14/2011   CO2 26 10/14/2011   INR 1.42 10/14/2011   Alert and awake, moving feet and legs well BP elevated getting beta blocker now  Delight Ovens MD  Beeper [phone] Office [phone] 10/14/2011 6:07 PM

## 2011-10-14 NOTE — Progress Notes (Signed)
At beginning of shift patient off unit in OR

## 2011-10-14 NOTE — Progress Notes (Signed)
Subjective:  Patient underwent endovascular repair of thoracic aneurysm/dissection today. Tolerated procedure well. Denies any abdominal pain chest pain shortness of breath  Objective:  Vital Signs in the last 24 hours: Temp:  [95.4 F (35.2 C)-99.4 F (37.4 C)] 97.9 F (36.6 C) (10/03 1600) Pulse Rate:  [44-78] 68  (10/03 1600) Resp:  [13-28] 25  (10/03 1600) BP: (112-170)/(70-119) 145/101 mmHg (10/03 1600) SpO2:  [92 %-99 %] 99 % (10/03 1600) Arterial Line BP: (164-208)/(77-107) 197/101 mmHg (10/03 1600) Weight:  [116.5 kg (256 lb 13.4 oz)] 116.5 kg (256 lb 13.4 oz) (10/03 0610)  Intake/Output from previous day: 10/02 0701 - 10/03 0700 In: 1080 [I.V.:1080] Out: 1675 [Urine:1675] Intake/Output from this shift: Total I/O In: 2500 [I.V.:2500] Out: 2485 [Urine:2335; Drains:75; Blood:75]  Physical Exam: Neck: no adenopathy, no carotid bruit, no JVD and supple, symmetrical, trachea midline Lungs: Clear to auscultation anterolaterally Heart: regular rate and rhythm, S1, S2 normal and Soft systolic murmur noted  Abdomen: soft, non-tender; bowel sounds normal; no masses,  no organomegaly Extremities: extremities normal, atraumatic, no cyanosis or edema and Right groin stable  Lab Results:  Basename 10/14/11 1130 10/13/11 1650  WBC 8.2 7.1  HGB 9.0* 9.4*  PLT 221 293    Basename 10/14/11 1130 10/13/11 1650  NA 132* 130*  K 4.1 4.0  CL 99 95*  CO2 26 28  GLUCOSE 128* 110*  BUN 4* 5*  CREATININE 0.94 0.98   No results found for this basename: TROPONINI:2,CK,MB:2 in the last 72 hours Hepatic Function Panel  Basename 10/13/11 1650  PROT 7.9  ALBUMIN 2.4*  AST 19  ALT 8  ALKPHOS 55  BILITOT 0.3  BILIDIR --  IBILI --   No results found for this basename: CHOL in the last 72 hours No results found for this basename: PROTIME in the last 72 hours  Imaging: Imaging results have been reviewed and Dg Chest Portable 1 View  10/14/2011  *RADIOLOGY REPORT*  Clinical Data:  Post stent graft placement  PORTABLE CHEST - 1 VIEW  Comparison: 10/09/2011  Findings: Right internal jugular CVP is in place with the tip located in the region of the superior vena cava.  Stent graft material is identified from the distal aspect of the aortic arch with the distal end not included on the film, below the diaphragm. Mild deviation of the trachea to the right is unchanged in comparison with the prior exam.  Cardiomegaly is identified.  Persistent density is seen associated with the left upper and midlung zones in the region of the aortic aneurysm on prior radiograph.  No pleural fluid or pneumothorax is seen.  No new density is identified with the right lung remaining clear and the left lung base also remaining clear.  IMPRESSION: Stent graft material and right internal jugular catheter positions as noted above. Otherwise unchanged cardiopulmonary appearance with no new adverse features suggested.   Original Report Authenticated By: Bertha Stakes, M.D.    Ct Angio Chest Aorta W/cm &/or Wo/cm  10/12/2011  **ADDENDUM** CREATED: 10/12/2011 20:25:21  I just spoke with Dr. Myra Gianotti by telephone.  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  Looking through the images again, there are lobar and segmental bronchi to the left lower lobe that are in very close proximity to the anterior wall of the aneurysm.  These track through the atelectatic compressed lung of the left lower lobe.  I can identify no definite findings that would pinpoint an aorto- bronchial  fistula.  No unexpected gas collection is evident.  The patient's esophagus tracks down along the anteromedial aspect of the aneurysm.  There is no evidence of wall thickening in the distal esophagus and there is no fluid in the esophagus adjacent to the aneurysm.  .**END ADDENDUM** SIGNED BY: Lorenda Ishihara. Molli Posey, M.D.   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  CT ANGIOGRAPHY CHEST, ABDOMEN AND PELVIS  Technique:  Multidetector CT imaging through the chest, abdomen and pelvis was performed using the standard protocol during bolus administration of intravenous contrast.  Multiplanar reconstructed images including MIPs were obtained and reviewed to evaluate the vascular anatomy.  Contrast: OMNIPAQUE IOHEXOL 350 MG/ML SOLN  Comparison:  CT chest from 10/09/2011  CTA CHEST  Findings:  As seen on the recent CT scan, the patient has a markedly aneurysmal tight be thoracoabdominal aortic dissection. Ascending aorta is stable and 3.9 cm in diameter compared 4.1 cm previously.  Dissection flap begins in the region of the proximal descending segment of the thoracic aorta.  Opacified blood is seen flowing into the false lumen, as before.  There is a patent channel in the largely thrombosed false lumen which tracks caudally into the abdomen.  There is poor flow within this false lumen channel, but it does ultimately perfused the left kidney, all be it to a decreased extent.  Measuring at the same level as on the previous study, the maximum diameter of the descending aneurysm is 10.3 x 11.6 cm today compared 10.9 x 12.0 cm previously.  There is some compressive atelectasis overlying the aneurysm sac and a tiny amount of pleural fluid is stable in the interval.   Review of the MIP images confirms the above findings.  IMPRESSION: Stable CT of the chest.  Massive for abdominal aortic aneurysm or dissection.  The false lumen compresses the true lumen as before and there is channel of poor flow through the largely thrombosed false lumen then tracks caudally to perfuse the left kidney.  CTA ABDOMEN AND PELVIS  Findings:  The aneurysm tapers through the diaphragmatic hiatus and the aorta measures 5.5 x 4.8 cm in orthogonal diameters at the level of the celiac axis.  The celiac axis is perfused off the true lumen.  The SMA is  perfused off the true lumen and the right renal artery arises from the true lumen.  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  The aorta measures 4.1 x 4.8 cm just proximal to the bifurcation and the dissection extends into the left common iliac artery with dissection flap seen extending into the left external iliac artery.  No evidence for propagation of the dissection into the left common femoral artery. 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  No focal abnormalities seen in the liver or spleen.  The stomach, duodenum, pancreas, and adrenal glands are unremarkable.  Calcified stones are noted in the lumen of the gallbladder.  No intra or extrahepatic biliary duct dilatation.  Right kidney is unremarkable.  5.0 cm cystic lesion identified in the posterior upper pole of the left kidney.  This measures water attenuation and is probably a cyst.  12 mm low density lesion in the posterior right kidney is also likely a cyst, but cannot be definitively characterized.  No free fluid or lymphadenopathy in the abdomen.  An umbilical hernia contains only fat.  Imaging through the pelvis shows no free intraperitoneal fluid. Bladder is unremarkable.  Prostate gland is enlarged.  Diverticular changes are seen in the sigmoid colon without diverticulitis. Terminal ileum and appendix are normal.  Bone windows reveal no worrisome lytic or sclerotic osseous lesions.   Review of the MIP images confirms the above findings.  IMPRESSION: Thoracoabdominal aortic aneurysm with dissection extending into the left common and external iliac arteries.  Left kidney shows decreased perfusion with left renal artery arising from a poorly perfused channel in the largely thrombosed false lumen.  The IMA also arises from the false lumen shows no opacification proximally suggesting thrombosis.   Original Report Authenticated By: ERIC A. MANSELL, M.D.    Ct Angio  Abd/pel W/ And/or W/o  10/12/2011  **ADDENDUM** CREATED: 10/12/2011 20:25:21  I just spoke with Dr. Myra Gianotti by telephone.  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  Looking through the images again, there are lobar and segmental bronchi to the left lower lobe that are in very close proximity to the anterior wall of the aneurysm.  These track through the atelectatic compressed lung of the left lower lobe.  I can identify no definite findings that would pinpoint an aorto- bronchial fistula.  No unexpected gas collection is evident.  The patient's esophagus tracks down along the anteromedial aspect of the aneurysm.  There is no evidence of wall thickening in the distal esophagus and there is no fluid in the esophagus adjacent to the aneurysm.  .**END ADDENDUM** SIGNED BY: Lorenda Ishihara. Molli Posey, M.D.   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  CT ANGIOGRAPHY CHEST, ABDOMEN AND PELVIS  Technique:  Multidetector CT imaging through the chest, abdomen and pelvis was performed using the standard protocol during bolus administration of intravenous contrast.  Multiplanar reconstructed images including MIPs were obtained and reviewed to evaluate the vascular anatomy.  Contrast: OMNIPAQUE IOHEXOL 350 MG/ML SOLN  Comparison:  CT chest from 10/09/2011  CTA CHEST  Findings:  As seen on the recent CT scan, the patient has a markedly aneurysmal tight be thoracoabdominal aortic dissection. Ascending aorta is stable and 3.9 cm in diameter compared 4.1 cm previously.  Dissection flap begins in the region of the proximal descending segment of the thoracic aorta.  Opacified blood is seen flowing into the false lumen, as before.  There is a patent channel in the largely thrombosed false lumen which tracks caudally into the abdomen.  There is poor flow within this false lumen channel,  but it does ultimately perfused the left kidney, all be it to a decreased extent.  Measuring at the same level as on the previous study, the maximum diameter of the descending aneurysm is 10.3 x 11.6 cm today compared 10.9 x 12.0 cm previously.  There is some compressive atelectasis overlying the aneurysm sac and a tiny amount of pleural fluid is stable in the interval.   Review of the MIP images confirms the above findings.  IMPRESSION: Stable CT of the chest.  Massive for abdominal aortic aneurysm or dissection.  The false lumen compresses the true lumen as before and there is channel of poor flow through the largely thrombosed false lumen then tracks caudally to perfuse the left kidney.  CTA ABDOMEN AND PELVIS  Findings:  The aneurysm tapers through the diaphragmatic hiatus and the aorta measures 5.5 x  4.8 cm in orthogonal diameters at the level of the celiac axis.  The celiac axis is perfused off the true lumen.  The SMA is perfused off the true lumen and the right renal artery arises from the true lumen.  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  The aorta measures 4.1 x 4.8 cm just proximal to the bifurcation and the dissection extends into the left common iliac artery with dissection flap seen extending into the left external iliac artery.  No evidence for propagation of the dissection into the left common femoral artery. 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  No focal abnormalities seen in the liver or spleen.  The stomach, duodenum, pancreas, and adrenal glands are unremarkable.  Calcified stones are noted in the lumen of the gallbladder.  No intra or extrahepatic biliary duct dilatation.  Right kidney is unremarkable.  5.0 cm cystic lesion identified in the posterior upper pole of the left kidney.  This measures water attenuation and is probably a cyst.  12 mm low density lesion in the posterior right kidney is also  likely a cyst, but cannot be definitively characterized.  No free fluid or lymphadenopathy in the abdomen.  An umbilical hernia contains only fat.  Imaging through the pelvis shows no free intraperitoneal fluid. Bladder is unremarkable.  Prostate gland is enlarged.  Diverticular changes are seen in the sigmoid colon without diverticulitis. Terminal ileum and appendix are normal.  Bone windows reveal no worrisome lytic or sclerotic osseous lesions.   Review of the MIP images confirms the above findings.  IMPRESSION: Thoracoabdominal aortic aneurysm with dissection extending into the left common and external iliac arteries.  Left kidney shows decreased perfusion with left renal artery arising from a poorly perfused channel in the largely thrombosed false lumen.  The IMA also arises from the false lumen shows no opacification proximally suggesting thrombosis.   Original Report Authenticated By: ERIC A. MANSELL, M.D.     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

## 2011-10-14 NOTE — Interval H&P Note (Signed)
History and Physical Interval Note:  10/14/2011 7:26 AM  Jesse Le  has presented today for surgery, with the diagnosis of thoracoabdominal aneurysm  The various methods of treatment have been discussed with the patient and family. After consideration of risks, benefits and other options for treatment, the patient has consented to  Procedure(s) (LRB) with comments: THORACIC AORTIC ENDOVASCULAR STENT GRAFT (N/A) - Possible carotid-subclavian bypass ENDOVASCULAR STENT GRAFT INSERTION (N/A) - possible bronchoscopy as a surgical intervention .  The patient's history has been reviewed, patient examined, no change in status, stable for surgery.  I have reviewed the patient's chart and labs.  Questions were answered to the patient's satisfaction.     Domingo Fuson IV, V. WELLS

## 2011-10-14 NOTE — Progress Notes (Signed)
Patient to go to 2300 after surgery.  Report called to RN on 2300 and all questions answered. Meds tubed to 2300 and patient belongings taken to 2312, and family updated.

## 2011-10-14 NOTE — Anesthesia Preprocedure Evaluation (Signed)
Anesthesia Evaluation  Patient identified by MRN, date of birth, ID band Patient awake    Reviewed: Allergy & Precautions, H&P , NPO status , Patient's Chart, lab work & pertinent test results  History of Anesthesia Complications Negative for: history of anesthetic complications  Airway Mallampati: III  Neck ROM: Full    Dental  (+) Poor Dentition and Missing   Pulmonary neg pulmonary ROS,    + decreased breath sounds      Cardiovascular hypertension, + CAD and + Past MI Rhythm:Regular Rate:Normal     Neuro/Psych    GI/Hepatic negative GI ROS, Neg liver ROS,   Endo/Other  negative endocrine ROS  Renal/GU negative Renal ROS     Musculoskeletal negative musculoskeletal ROS (+)   Abdominal (+) + obese,   Peds  Hematology  (+) Blood dyscrasia, anemia ,   Anesthesia Other Findings   Reproductive/Obstetrics                           Anesthesia Physical Anesthesia Plan  ASA: IV  Anesthesia Plan: General   Post-op Pain Management:    Induction: Intravenous  Airway Management Planned: Oral ETT  Additional Equipment: PA Cath and Ultrasound Guidance Line Placement  Intra-op Plan:   Post-operative Plan: Extubation in OR and Possible Post-op intubation/ventilation  Informed Consent: I have reviewed the patients History and Physical, chart, labs and discussed the procedure including the risks, benefits and alternatives for the proposed anesthesia with the patient or authorized representative who has indicated his/her understanding and acceptance.   Dental advisory given  Plan Discussed with: CRNA and Surgeon  Anesthesia Plan Comments:         Anesthesia Quick Evaluation

## 2011-10-14 NOTE — Preoperative (Signed)
Beta Blockers   Reason not to administer Beta Blockers:Not Applicable 

## 2011-10-14 NOTE — Transfer of Care (Signed)
Immediate Anesthesia Transfer of Care Note  Patient: Jesse Le  Procedure(s) Performed: Procedure(s) (LRB) with comments: THORACIC AORTIC ENDOVASCULAR STENT GRAFT (N/A) - Ultrasound guided. VIDEO BRONCHOSCOPY (N/A) INTRAVASCULAR ULTRASOUND (N/A)  Patient Location: PACU  Anesthesia Type: General  Level of Consciousness: awake, alert  and oriented  Airway & Oxygen Therapy: Patient Spontanous Breathing and Patient connected to face mask oxygen  Post-op Assessment: Report given to PACU RN and Post -op Vital signs reviewed and stable  Post vital signs: Reviewed and stable  Complications: No apparent anesthesia complications

## 2011-10-14 NOTE — Progress Notes (Signed)
Pt transferred to OR via stretcher on telemetry with RN at side. Family at bedside and updated.

## 2011-10-14 NOTE — OR Nursing (Signed)
First Time Out performed at [phone]. Video bronchoscopy started at [phone] and ended at 61. Second Time Out performed at 0901. Start time for endovascular thoracic stent, 0904, end at 1053.

## 2011-10-14 NOTE — Anesthesia Postprocedure Evaluation (Signed)
  Anesthesia Post-op Note  Patient: Jesse Le  Procedure(s) Performed: Procedure(s) (LRB) with comments: THORACIC AORTIC ENDOVASCULAR STENT GRAFT (N/A) - Ultrasound guided. VIDEO BRONCHOSCOPY (N/A) INTRAVASCULAR ULTRASOUND (N/A)  Patient Location: PACU  Anesthesia Type: General  Level of Consciousness: awake  Airway and Oxygen Therapy: Patient Spontanous Breathing  Post-op Pain: mild  Post-op Assessment: Post-op Vital signs reviewed, Patient's Cardiovascular Status Stable and Respiratory Function Stable  Post-op Vital Signs: stable  Complications: No apparent anesthesia complications

## 2011-10-14 NOTE — Op Note (Signed)
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 Tyrone Sage Procedure:   Endovascular repair of thoracic aneurysm   Proximal extension x2   Intravascular ultrasound (IVUS)   Catheter in aorta x2    Thoracic aortogram   Abdominal aortogram   Ultrasound-guided access, right femoral artery Anesthesia:  Gen. Blood Loss:  See anesthesia record Specimens:  None  Findings:  Successful exclusion  Indications:  The patient presented with hemoptysis. Imaging studies revealed a 12 cm thoracic aneurysm secondary to dissection. It was presumed that this was the source of his hemoptysis. Therefore we have discussed endovascular repair as he is not a candidate for open repair. I discussed the risks and benefits as detailed in my note yesterday with the patient and the family. He understands the complexity of this operation as well as the morbidity and mortality, and he wishes to proceed.  Devices used: Distal piece was a Art gallery manager 34 x 15, index proximal piece was a Gore CTAG 37 x 15, the final proximal extension was a Gore CTAG  45 x 15  Procedure:  The patient was identified in the holding area and taken to [facility] OR ROOM 16  The patient was then placed supine on the table. general anesthesia was administered.  The patient was prepped and draped in the usual sterile fashion.  A time out was called and antibiotics were administered.  Ultrasound was used to evaluate the right common femoral artery. It was widely patent without significant calcification. A 11 blade was used to make a skin incision. Using the ultrasound guidance an 18-gauge needle accessed the right femoral artery. A 035 wire was advanced in the subcutaneous tract  was dilated with an 8 [place] dilator. Pro-glide devices were deployed at the 11:00 and 1:00 position for pre-closure. A Lunderquist double curve wire was then advanced into the descending aorta. A 24 French sheath was then placed. The patient was fully heparinized. Next, the intravascular ultrasound catheter was advanced over the wire. Images from the catheter confirmed that we stayed in the true lumen the entire time and that the septum of the dissection was mobile. Next a pigtail catheter was advanced into the descending aorta and a thoracic aortic angiogram was performed which showed the origin of the subclavian artery as well as the aortic dissection. Fundus we were able to get the total length required for treatment down to the celiac artery. Next the catheter was pulled down to the distal thoracic aorta in order to obtain a abdominal aortogram and a lateral projection, so that we could see the origin of the celiac artery. The first piece was a Gore CTAG 34 x 15 device. This was advanced over the Lunderquist double curve wire and then deployed landing just proximal to the celiac artery. Next, the pigtail catheter was advanced back into the descending aorta and a repeat thoracic aortogram was performed with the image detector and a LAO 48 position. I selected a proximal extension piece to be a 37 x 15. This was advanced over the wire and deployed with approximately 7 cm of overlap. Next the final proximal piece was a Gore CTAG 45 x 15. This was deployed landing at the level of the subclavian artery with a bare-metal portion of the stent covering the subclavian  artery. I did elect to balloon the overlap between the 45 device and the 37 device. This was the only portion of the graft that was molded with a balloon. I used a trilobed balloon. A completion arteriogram was performed which showed that the stent graft was in good position. The subclavian vessel remained patent. The celiac artery remained patent. There  was successful exclusion of the dissection. At this point the decision was made to terminate the procedure. The Lunderquist wire was removed. A Benson wire was advanced to the pigtail catheter which was removed. I then withdrew the 24 French sheath and closed the groin by securing the pro-glide devices. 50 mg of protamine was administered. Once hemostasis was satisfactory the skin was closed with a 4-0 Vicryl and Dermabond was placed. The patient continued to have palpable dorsalis pedis pulses bilaterally. Upon extubation he was able to move both of his lower extremities .   Disposition:  To PACU in stable condition.   Juleen China, M.D. [facility] [place] Office: [phone] Pager:  [phone]

## 2011-10-14 NOTE — H&P (View-Only) (Signed)
[facility]  Subjective  -  No more hemoptysis No chest pain or shortness of breath   Physical Exam:  Gen:  NAD Pulm:  Non-labored breathing Abd:  Soft, non-tender Ext:  Palpable pedal and femoral pulses       Assessment/Plan:  I have reviewed his CTA of the chest, abdomen, and pelvis extensively.  A segmental bronchus is in close proximity to the anterior surface of his aneurysm and I suspect this is the cause of his hemoptysis.  He has no CT signs of esophageal varices.  I reviewed our treatment options with the patient and his family at the bedside.  Our options include palliative care, which will likely result in death either from worsening hemoptysis or rupture of his large aneurysm.  Or, we can proceed with endovascular repair.  He is not a candidate for open repair.  I discussed the risks and benefits with him and he wishes to proceed with repair.  We specifically discussed the risk of paralysis, graft infection, and the potential need for future interventions.  We also discussed the risk of cardiopulmonary complications.  He has been scheduled for tomorrow morning. I spent 1 hour reviewing the CT scan and planning his procedure.  30 minutes was face to face with the patient and family.  Brian Zeitlin IV, V. WELLS 10/13/2011 6:41 PM --  Filed Vitals:   10/13/11 1542  BP: 121/84  Pulse:   Temp: 98 F (36.7 C)  Resp:     Intake/Output Summary (Last 24 hours) at 10/13/11 1841 Last data filed at 10/13/11 1542  Gross per 24 hour  Intake 1107.5 ml  Output    975 ml  Net  132.5 ml     Laboratory CBC    Component Value Date/Time   WBC 7.1 10/13/2011 1650   HGB 9.4* 10/13/2011 1650   HCT 29.8* 10/13/2011 1650   PLT 293 10/13/2011 1650    BMET    Component Value Date/Time   NA 130* 10/13/2011 1650   K 4.0 10/13/2011 1650   CL 95* 10/13/2011 1650   CO2 28 10/13/2011 1650   GLUCOSE 110* 10/13/2011 1650   BUN 5* 10/13/2011 1650   CREATININE  0.98 10/13/2011 1650   CALCIUM 9.3 10/13/2011 1650   GFRNONAA 84* 10/13/2011 1650   GFRAA >90 10/13/2011 1650    COAG Lab Results  Component Value Date   INR 1.27 10/13/2011   INR 1.33 10/10/2011   INR 1.31 10/09/2011   No results found for this basename: PTT    Antibiotics Anti-infectives     Start     Dose/Rate Route Frequency Ordered Stop   10/11/11 1300   amoxicillin-clavulanate (AUGMENTIN) 875-125 MG per tablet 1 tablet        1 tablet Oral 2 times daily 10/11/11 1238             V. Durene Cal IV, M.D. [facility] Office: [phone] Pager:  [phone]

## 2011-10-14 NOTE — Progress Notes (Signed)
Asked to adjust antibiotics for renal function in this 66 yo man with est CrCl >100 ml/min.  His renal function is great and would not need adjustments.  Pharmacy will sign off. Please advise if we can be of further assistance.

## 2011-10-15 ENCOUNTER — Inpatient Hospital Stay (HOSPITAL_COMMUNITY): Payer: Medicare Other

## 2011-10-15 ENCOUNTER — Encounter (HOSPITAL_COMMUNITY): Payer: Self-pay | Admitting: Surgery

## 2011-10-15 LAB — BASIC METABOLIC PANEL
BUN: 4 mg/dL — ABNORMAL LOW (ref 6–23)
Creatinine, Ser: 1.26 mg/dL (ref 0.50–1.35)
GFR calc Af Amer: 67 mL/min — ABNORMAL LOW (ref >90)
GFR calc non Af Amer: 58 mL/min — ABNORMAL LOW (ref >90)
Potassium: 4.2 mEq/L (ref 3.5–5.1)

## 2011-10-15 LAB — CBC
HCT: 30.1 % — ABNORMAL LOW (ref 39.0–52.0)
MCHC: 31.9 g/dL (ref 30.0–36.0)
RDW: 14.3 % (ref 11.5–15.5)

## 2011-10-15 NOTE — Progress Notes (Signed)
Subjective:  Doing well no chest pain shortness of breath or abdominal pain  Objective:  Vital Signs in the last 24 hours: Temp:  [97.1 F (36.2 C)-98.8 F (37.1 C)] 97.5 F (36.4 C) (10/04 1138) Pulse Rate:  [51-81] 67  (10/04 1200) Resp:  [16-31] 20  (10/04 1200) BP: (117-169)/(59-119) 123/69 mmHg (10/04 1200) SpO2:  [91 %-99 %] 98 % (10/04 1200) Arterial Line BP: (171-208)/(79-107) 172/84 mmHg (10/03 1900) Weight:  [117.1 kg (258 lb 2.5 oz)] 117.1 kg (258 lb 2.5 oz) (10/04 0436)  Intake/Output from previous day: 10/03 0701 - 10/04 0700 In: 2920 [I.V.:2870; IV Piggyback:50] Out: 4440 [Urine:4040; Drains:325; Blood:75] Intake/Output from this shift: Total I/O In: 120 [I.V.:120] Out: 160 [Urine:160]  Physical Exam: Neck: no adenopathy, no carotid bruit, no JVD and supple, symmetrical, trachea midline Lungs: Clear anterolaterally decreased breath sound at bases Heart: regular rate and rhythm, S1, S2 normal and Soft systolic murmur noted no S3 gallop Abdomen: soft, non-tender; bowel sounds normal; no masses,  no organomegaly Extremities: extremities normal, atraumatic, no cyanosis or edema  Lab Results:  Basename 10/15/11 0400 10/14/11 1130  WBC 11.4* 8.2  HGB 9.6* 9.0*  PLT 247 221    Basename 10/15/11 0400 10/14/11 1130  NA 132* 132*  K 4.2 4.1  CL 96 99  CO2 27 26  GLUCOSE 147* 128*  BUN 4* 4*  CREATININE 1.26 0.94   No results found for this basename: TROPONINI:2,CK,MB:2 in the last 72 hours Hepatic Function Panel  Basename 10/13/11 1650  PROT 7.9  ALBUMIN 2.4*  AST 19  ALT 8  ALKPHOS 55  BILITOT 0.3  BILIDIR --  IBILI --   No results found for this basename: CHOL in the last 72 hours No results found for this basename: PROTIME in the last 72 hours  Imaging: Imaging results have been reviewed and Dg Chest Port 1 View  10/15/2011  *RADIOLOGY REPORT*  Clinical Data: History of thoracic aortic dissection and status post stent graft placement.   PORTABLE CHEST - 1 VIEW  Comparison: 10/14/2011  Findings: The configuration of the aortic stent graft is grossly unchanged.  Again noted is a large thoracic aorta.  Heart size is grossly stable.  The right jugular central venous catheter is still in place.  Few densities at the lung bases are most compatible with atelectasis.  IMPRESSION: Basilar densities are suggestive for atelectasis.  Stable appearance of the aortic stent graft and enlarged thoracic aorta.   Original Report Authenticated By: Richarda Overlie, M.D.    Dg Chest Portable 1 View  10/14/2011  *RADIOLOGY REPORT*  Clinical Data: Post stent graft placement  PORTABLE CHEST - 1 VIEW  Comparison: 10/09/2011  Findings: Right internal jugular CVP is in place with the tip located in the region of the superior vena cava.  Stent graft material is identified from the distal aspect of the aortic arch with the distal end not included on the film, below the diaphragm. Mild deviation of the trachea to the right is unchanged in comparison with the prior exam.  Cardiomegaly is identified.  Persistent density is seen associated with the left upper and midlung zones in the region of the aortic aneurysm on prior radiograph.  No pleural fluid or pneumothorax is seen.  No new density is identified with the right lung remaining clear and the left lung base also remaining clear.  IMPRESSION: Stent graft material and right internal jugular catheter positions as noted above. Otherwise unchanged cardiopulmonary appearance with no new adverse  features suggested.   Original Report Authenticated By: Bertha Stakes, M.D.     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 Algie Coffer on call for weekend  LOS: 6 days    Jesse Le  N 10/15/2011, 12:04 PM

## 2011-10-15 NOTE — Progress Notes (Signed)
Patient ID: Jesse Le, male   DOB: 06/21/1945, 66 y.o.   MRN: [id number] TCTS DAILY PROGRESS NOTE                   [address]            [place] [address]          [phone]      1 Day Post-Op Procedure(s) (LRB): THORACIC AORTIC ENDOVASCULAR STENT GRAFT (N/A) VIDEO BRONCHOSCOPY (N/A) INTRAVASCULAR ULTRASOUND (N/A)  Total Length of Stay:  LOS: 6 days   Subjective: Felt hot last night, said abdomen hurt last night resolved now Not coughing up any blood  Objective: Vital signs in last 24 hours: Temp:  [95.4 F (35.2 C)-98.8 F (37.1 C)] 98.8 F (37.1 C) (10/04 0748) Pulse Rate:  [44-81] 71  (10/04 0700) Cardiac Rhythm:  [-] Normal sinus rhythm (10/03 2300) Resp:  [13-31] 30  (10/04 0700) BP: (117-170)/(59-119) 146/83 mmHg (10/04 0700) SpO2:  [91 %-99 %] 97 % (10/04 0700) Arterial Line BP: (164-208)/(77-107) 172/84 mmHg (10/03 1900) Weight:  [258 lb 2.5 oz (117.1 kg)] 258 lb 2.5 oz (117.1 kg) (10/04 0436)  Filed Weights   10/13/11 0416 10/14/11 0610 10/15/11 0436  Weight: 256 lb 13.4 oz (116.5 kg) 256 lb 13.4 oz (116.5 kg) 258 lb 2.5 oz (117.1 kg)    Weight change: 1 lb 5.2 oz (0.6 kg)   Hemodynamic parameters for last 24 hours: CVP:  [15 mmHg-17 mmHg] 17 mmHg  Intake/Output from previous day: 10/03 0701 - 10/04 0700 In: 2920 [I.V.:2870; IV Piggyback:50] Out: 4440 [Urine:4040; Drains:325; Blood:75]  Intake/Output this shift:    Current Meds: Scheduled Meds:   . amoxicillin-clavulanate  1 tablet Oral BID  . budesonide-formoterol  2 puff Inhalation BID  . cefUROXime (ZINACEF)  IV  1.5 g Intravenous On Call to OR  . cefUROXime (ZINACEF)  IV  1.5 g Intravenous Q12H  . chlorpheniramine-HYDROcodone  5 mL Oral BID  . docusate sodium  100 mg Oral BID  . glycopyrrolate  0.2 mg Intravenous Once  . metoprolol succinate  100 mg Oral Q12H  . nitroGLYCERIN  0.4 mg Transdermal Daily  . pantoprazole  40 mg Oral Q1200  . simvastatin  20 mg Oral QPM    . sodium chloride  3 mL Intravenous Q12H  . DISCONTD: docusate sodium  100 mg Oral Daily  . DISCONTD: lisinopril  40 mg Oral BID   Continuous Infusions:   . dextrose 5 % and 0.45 % NaCl with KCl 20 mEq/L 30 mL/hr at 10/13/11 1900  . DOPamine     PRN Meds:.sodium chloride, acetaminophen, acetaminophen, alum & mag hydroxide-simeth, guaiFENesin-dextromethorphan, hydrALAZINE, HYDROcodone-acetaminophen, HYDROmorphone (DILAUDID) injection, labetalol, magnesium sulfate 1 - 4 g bolus IVPB, meperidine (DEMEROL) injection, metoprolol, morphine injection, ondansetron, ondansetron, oxyCODONE, oxyCODONE, oxyCODONE, phenol, potassium chloride, promethazine, DISCONTD: 0.9 % irrigation (POUR BTL) DISCONTD: guaiFENesin, DISCONTD: heparin 6000 unit irrigation, DISCONTD: iodixanol, DISCONTD: labetalol, DISCONTD: ondansetron (ZOFRAN) IV  General appearance: alert, cooperative and no distress Neurologic: intact Heart: regular rate and rhythm, S1, S2 normal, no murmur, click, rub or gallop and normal apical impulse Lungs: clear to auscultation bilaterally Abdomen: soft, non-tender; bowel sounds normal; no masses,  no organomegaly Extremities: extremities normal, atraumatic, no cyanosis or edema, Homans sign is negative, no sign of DVT, no edema, redness or tenderness in the calves or thighs and rt groin site intact without hematoma or bleeding Wound: palpable dt pt pulses bil; paterial  Lab Results: CBC: Basename  10/15/11 0400 10/14/11 1130  WBC 11.4* 8.2  HGB 9.6* 9.0*  HCT 30.1* 28.2*  PLT 247 221   BMET:  Basename 10/15/11 0400 10/14/11 1130  NA 132* 132*  K 4.2 4.1  CL 96 99  CO2 27 26  GLUCOSE 147* 128*  BUN 4* 4*  CREATININE 1.26 0.94  CALCIUM 9.0 8.7    PT/INR:  Basename 10/14/11 1130  LABPROT 17.0*  INR 1.42   Radiology: Dg Chest Portable 1 View  10/14/2011  *RADIOLOGY REPORT*  Clinical Data: Post stent graft placement  PORTABLE CHEST - 1 VIEW  Comparison: 10/09/2011  Findings: Right  internal jugular CVP is in place with the tip located in the region of the superior vena cava.  Stent graft material is identified from the distal aspect of the aortic arch with the distal end not included on the film, below the diaphragm. Mild deviation of the trachea to the right is unchanged in comparison with the prior exam.  Cardiomegaly is identified.  Persistent density is seen associated with the left upper and midlung zones in the region of the aortic aneurysm on prior radiograph.  No pleural fluid or pneumothorax is seen.  No new density is identified with the right lung remaining clear and the left lung base also remaining clear.  IMPRESSION: Stent graft material and right internal jugular catheter positions as noted above. Otherwise unchanged cardiopulmonary appearance with no new adverse features suggested.   Original Report Authenticated By: Bertha Stakes, M.D.      Assessment/Plan: S/P Procedure(s) (LRB): THORACIC AORTIC ENDOVASCULAR STENT GRAFT (N/A) VIDEO BRONCHOSCOPY (N/A) INTRAVASCULAR ULTRASOUND (N/A) Mobilize Diabetes control d/c tubes/lines Plan for transfer to step-down: see transfer orders D/C epidural drain To 3300 Back on beta blocker, resume ACE when sure CR is stable    Jesse Le B 10/15/2011 8:11 AM

## 2011-10-15 NOTE — Progress Notes (Signed)
Maximum volume of drainage achieved ( ) from lumbar drain.

## 2011-10-15 NOTE — Progress Notes (Signed)
Called by Dr. Tyrone Sage to remove the patient's lumbar drain catheter.  Pt was doing well S/P Thoracic Aneurysm Stent.  Pt was placed on hi right side.  The drain was removed w/o difficulty with the tip intact.  The site was clear and the patient tolerated the removal well.  Staff and patient were informed to keep the patient flat when possible for the next 24 hours to reduce the incidence of spinal headache.

## 2011-10-15 NOTE — Progress Notes (Signed)
Utilization review completed.  

## 2011-10-15 NOTE — Progress Notes (Signed)
ICP label measuring lumbar drain pressure.  Labeled incorrectly due to compatability issue with documenting this pressure. IE unable to carry over documentation with accurate label.

## 2011-10-15 NOTE — Progress Notes (Signed)
Patient seen , s/p endovascular stent of thoracic aneurysm/dissection. No palliative needs at this time, will sign off. Re consult if needed.

## 2011-10-15 NOTE — Op Note (Signed)
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  SURGICAL PROCEDURE:  Endovascular stenting of the thoracic aorta with Gore-Tex devices x3, aortogram, ProGlide closure of right femoral artery and bronchoscopy. IVUS of aorta CO-SURGEONS:  Sheliah Plane, MD and V. Durene Cal IV, MD.  BRIEF HISTORY:  The patient is a 66 year old male with no previous known history of aortic dissection.  He had undergone cardiac catheterization in early 2000, but has had a little followup since that time.  This admission, he presented to the hospital with hemoptysis.  A chest x-ray was obtained and showed a massive dilatation of thoracic aorta.  CTA was obtained and then demonstrated a 10 cm thoracic aorta from the left subclavian, measured just past the left subclavian artery with a persistent false lumen with the true lumen feeding the celiac, SMA, right renal artery.  The left renal artery had opacified, probably filling off the false lumen.  The patient has numerous medical problems and was not a candidate to consider an open repair.  Placement of endovascular thoracic grafts was offered to the patient.  Risks and options including a risk of vascular injury, aortic rupture, malperfusion and possible paraplegia were all discussed with the patient in detail.  He agreed and signed informed consent.  DESCRIPTION OF PROCEDURE:  The procedure was done as a co-surgeon with Dr. Myra Gianotti.  His note describes the details of vascular  access.  After time out, the patient underwent general endotracheal anesthesia without incident.  Because of his history of hemoptysis, fiberoptic bronchoscope was introduced through the endotracheal tube carefully.  It was a small amount of blood in the bronchial tree, probably on the left side.  Very minimal suction was performed at this point.  There were no obvious endobronchial lesions in the either side of the chest, any alternative cause for hemoptysis.  The scope was removed.  The patient was positioned on the table with the echo, fluoroscopy done, a CT was obtained.  Appropriate measurements angles identified for optimal visualization of the arch.  The patient was given 8000 units of heparin with the sono site.  The right femoral artery was identified.  Benson wire was placed into the femoral artery without difficulty.  The 2 opposing ProGlide devices were deployed for future closure of the artery.  A small access sheath was then placed into the femoral artery, and pigtail catheter was positioned into the aorta, and an aortogram was performed to visualize the landmarks for deployment of the device. Prior to the aortogram, a Volcano IVUS imaging device was passed into the aorta to confirmed that the path of the catheter was in fact completely within the true lumen.  Images to confirm this were  taken. The aortogram was performed to identify the distal extent of the graft placements to the lateral images with the second aortogram to identify the takeoff of the celiac was also done.  Placement of the endovascular devices was started distally with the Gore-Tex device [id number].  Lot #[id number].  The device introducer with dilator was easily passed into the right femoral artery over the previously placed wires.  A second buddy wire was then placed into the aorta with the device position properly.  The most distal device was deployed first and expanded nicely.  A second Gore device  [id number] was then positioned with at least 5 cm to overlap from the lower through the previously placed stent and deployed without incident.  The third device [id number], batch #[id number] was then positioned in the most proximal descending aorta at the just distal to the takeoff of the subclavian artery without covering the subclavian with proximal pass on the reverse with overlap of the previous stent but without interference from the first, and this device was also deployed without difficulty.  After deployment of each of the devised, very slight dilatation with probable balloon was done at the overlapped segments but avoiding the proximal and distal edges. Fluoroscopically it appeared to have good deployment of all 3 devices. The pigtail catheter was positioned back into the ascending aorta, and a repeat aortogram was performed showing no evidence of endo leak, and with good positioning of the stents.  With the guidewire still in place, the sheaths were removed.  The ProGlide device closure was used. Protamine sulfate was administered.  At the completion the procedure, before the patient was extubated, a repeat bronchoscopy was performed with more examination of the left and right bronchial tree.  Old secretions were removed.  No definite source of bleeding was identified by bronchoscopy.  The scope was removed.  The right groin was hemostatic.  The patient had palpable distal pulses at the completion of the procedure.  He was awakened and transferred to the recovery room. As he awoke, he had movement of his feet to command.  Estimated blood loss approximately 100 mL.  Total contrast 3 aortograms at 20 mL each. The patient tolerated the procedure without obvious complications.  Dr. Tyrone Sage dictating in conjunction as co-surgeon with Dr. Myra Gianotti.     Sheliah Plane, MD     EG/MEDQ  D:  10/15/2011  T:  10/15/2011  Job:  [id number]

## 2011-10-15 NOTE — Progress Notes (Signed)
[facility] [place]  Post-op TEVAR Date of Surgery: 10/09/2011 - 10/14/2011 Surgeon: Moishe Spice): Delight Ovens, MD Nada Libman, MD POD: 1 Day Post-Op Device: Distal piece was a Gore CTAG 34 x 15, index proximal piece was a Gore CTAG 37 x 15, the final proximal extension was a Gore CTAG 45 x 15  completion arteriogram was performed which showed that the stent graft was in good position. The subclavian vessel remained patent. The celiac artery remained patent. There was successful exclusion of the dissection  History of Present Illness  Jesse Le is a 66 y.o. male who is s/p TEVAR. The patient denies back pain; complains of intermittent abdominal pain; denies lower extremity pain. No flatus or BM; denies chest [pain  IMAGING: Dg Chest Portable 1 View  10/14/2011  *RADIOLOGY REPORT*  Clinical Data: Post stent graft placement  PORTABLE CHEST - 1 VIEW  Comparison: 10/09/2011  Findings: Right internal jugular CVP is in place with the tip located in the region of the superior vena cava.  Stent graft material is identified from the distal aspect of the aortic arch with the distal end not included on the film, below the diaphragm. Mild deviation of the trachea to the right is unchanged in comparison with the prior exam.  Cardiomegaly is identified.  Persistent density is seen associated with the left upper and midlung zones in the region of the aortic aneurysm on prior radiograph.  No pleural fluid or pneumothorax is seen.  No new density is identified with the right lung remaining clear and the left lung base also remaining clear.  IMPRESSION: Stent graft material and right internal jugular catheter positions as noted above. Otherwise unchanged cardiopulmonary appearance with no new adverse features suggested.   Original Report Authenticated By: Bertha Stakes, M.D.     Significant Diagnostic Studies: CBC Lab Results  Component Value Date   WBC 11.4* 10/15/2011   HGB 9.6* 10/15/2011   HCT 30.1* 10/15/2011   MCV 82.0 10/15/2011   PLT 247 10/15/2011     BMET    Component Value Date/Time   NA 132* 10/15/2011 0400   K 4.2 10/15/2011 0400   CL 96 10/15/2011 0400   CO2 27 10/15/2011 0400   GLUCOSE 147* 10/15/2011 0400   BUN 4* 10/15/2011 0400   CREATININE 1.26 10/15/2011 0400   CALCIUM 9.0 10/15/2011 0400   GFRNONAA 58* 10/15/2011 0400   GFRAA 67* 10/15/2011 0400    COAG Lab Results  Component Value Date   INR 1.42 10/14/2011   INR 1.27 10/13/2011   INR 1.33 10/10/2011   No results found for this basename: PTT     I/O last 3 completed shifts: In: 3250 [I.V.:3200; IV Piggyback:50] Out: 5615 [Urine:5215; Drains:325; Blood:75] No data found.   Physical Examination  BP Readings from Last 3 Encounters:  10/15/11 146/83  10/15/11 146/83   Temp Readings from Last 3 Encounters:  10/15/11 98.8 F (37.1 C) Axillary  10/15/11 98.8 F (37.1 C) Axillary   SpO2 Readings from Last 3 Encounters:  10/15/11 97%  10/15/11 97%   Pulse Readings from Last 3 Encounters:  10/15/11 71  10/15/11 71    General: A&O x 3, WDWN male in NAD Pulmonary: normal non-labored breathing  Cardiac: RRR Abdomen: soft, NT, min BS Bilateral groin wounds: clean, dry, intact, without hematoma Vascular Exam/Pulses:palpable DP bilat. Moving all extremities well Extremities without ischemic changes, no Gangrene , no cellulitis; no open wounds;   Neurologic: A&O X 3;  Appropriate Affect  Assessment: Jesse Le is a 66 y.o. male who is 1 Day Post-Op TEVAR.  Pt is doing well with complaints of intermittent abd discomfort BLE well perfused with good motion/ sensation  Signed: Marlowe Shores [phone] 10/15/2011 8:11 AM.

## 2011-10-16 LAB — BASIC METABOLIC PANEL
BUN: 5 mg/dL — ABNORMAL LOW (ref 6–23)
CO2: 29 mEq/L (ref 19–32)
Calcium: 8.7 mg/dL (ref 8.4–10.5)
Chloride: 93 mEq/L — ABNORMAL LOW (ref 96–112)
Creatinine, Ser: 1.32 mg/dL (ref 0.50–1.35)
GFR calc Af Amer: 63 mL/min — ABNORMAL LOW (ref >90)
GFR calc non Af Amer: 55 mL/min — ABNORMAL LOW (ref >90)
Glucose, Bld: 132 mg/dL — ABNORMAL HIGH (ref 70–99)
Potassium: 4.6 mEq/L (ref 3.5–5.1)
Sodium: 129 mEq/L — ABNORMAL LOW (ref 135–145)

## 2011-10-16 LAB — CBC
HCT: 29.3 % — ABNORMAL LOW (ref 39.0–52.0)
Hemoglobin: 9.3 g/dL — ABNORMAL LOW (ref 13.0–17.0)
MCH: 26.3 pg (ref 26.0–34.0)
MCHC: 31.7 g/dL (ref 30.0–36.0)
MCV: 82.8 fL (ref 78.0–100.0)
Platelets: 252 10*3/uL (ref 150–400)
RBC: 3.54 MIL/uL — ABNORMAL LOW (ref 4.22–5.81)
RDW: 14.6 % (ref 11.5–15.5)
WBC: 13.5 10*3/uL — ABNORMAL HIGH (ref 4.0–10.5)

## 2011-10-16 MED ORDER — SODIUM CHLORIDE 0.9 % IV SOLN
INTRAVENOUS | Status: DC
  Administered 2011-10-16: 07:00:00 via INTRAVENOUS

## 2011-10-16 MED ORDER — DEXTROSE-NACL 5-0.45 % IV SOLN
INTRAVENOUS | Status: DC
  Administered 2011-10-16: 10:00:00 via INTRAVENOUS
  Administered 2011-10-16: 125 mL/h via INTRAVENOUS
  Administered 2011-10-17 (×2): via INTRAVENOUS

## 2011-10-16 MED ORDER — SODIUM CHLORIDE 0.9 % IV BOLUS (SEPSIS)
500.0000 mL | Freq: Once | INTRAVENOUS | Status: AC
  Administered 2011-10-16: 500 mL via INTRAVENOUS

## 2011-10-16 NOTE — Progress Notes (Signed)
Spit up very small amount of blood after ambulation to BR. Pt states he was extremely dizzy and 'did not feel well after ambulation' Pt BP lying 15 min after ambulation:156/92 HR70 O2 sat 98% RA   Pt was able to have small BM

## 2011-10-16 NOTE — Progress Notes (Signed)
Subjective:  No chest pain. Wants to walk around. Afebrile.  Objective:  Vital Signs in the last 24 hours: Temp:  [97.6 F (36.4 C)-98.6 F (37 C)] 98.6 F (37 C) (10/05 1137) Pulse Rate:  [67-75] 74  (10/05 0415) Cardiac Rhythm:  [-] Normal sinus rhythm (10/05 0810) Resp:  [20-26] 22  (10/05 0415) BP: (114-129)/(68-85) 116/72 mmHg (10/05 0415) SpO2:  [92 %-97 %] 92 % (10/05 0415) Weight:  [119.2 kg (262 lb 12.6 oz)] 119.2 kg (262 lb 12.6 oz) (10/05 0300)  Physical Exam: BP Readings from Last 1 Encounters:  10/16/11 116/72    Wt Readings from Last 1 Encounters:  10/16/11 119.2 kg (262 lb 12.6 oz)    Weight change: 2.1 kg (4 lb 10.1 oz)  HEENT: [place]/AT, Eyes-Brown, PERL, EOMI, Conjunctiva-Pale pink, Sclera-Non-icteric Neck: No JVD, No bruit, Trachea midline. Lungs:  Clear, Bilateral. Cardiac:  Regular rhythm, normal S1 and S2, no S3.  Abdomen:  Soft, non-tender. Extremities:  No edema present. No cyanosis. No clubbing. CNS: AxOx3, Cranial nerves grossly intact. Skin: Warm and dry.   Intake/Output from previous day: 10/04 0701 - 10/05 0700 In: 960 [P.O.:840; I.V.:120] Out: 435 [Urine:435]    Lab Results: BMET    Component Value Date/Time   NA 129* 10/16/2011 0500   K 4.6 10/16/2011 0500   CL 93* 10/16/2011 0500   CO2 29 10/16/2011 0500   GLUCOSE 132* 10/16/2011 0500   BUN 5* 10/16/2011 0500   CREATININE 1.32 10/16/2011 0500   CALCIUM 8.7 10/16/2011 0500   GFRNONAA 55* 10/16/2011 0500   GFRAA 63* 10/16/2011 0500   CBC    Component Value Date/Time   WBC 13.5* 10/16/2011 0500   RBC 3.54* 10/16/2011 0500   HGB 9.3* 10/16/2011 0500   HCT 29.3* 10/16/2011 0500   PLT 252 10/16/2011 0500   MCV 82.8 10/16/2011 0500   MCH 26.3 10/16/2011 0500   MCHC 31.7 10/16/2011 0500   RDW 14.6 10/16/2011 0500   LYMPHSABS 1.5 10/09/2011 1013   MONOABS 0.5 10/09/2011 1013   EOSABS 0.1 10/09/2011 1013   BASOSABS 0.0 10/09/2011 1013   CARDIAC ENZYMES No results found for this basename: CKTOTAL,  CKMB, CKMBINDEX, TROPONINI    Assessment/Plan:  Patient Active Hospital Problem List: Status post endovascular stent of thoracic aneurysm/dissection doing well  Status post hemoptysis questionable aorto bronchial fistula  Uncontrolled hypertension  COPD  Mild CAD  Anemia stable   Follow with vascular surgery. Advance diet to full liquids   LOS: 7 days    Orpah Cobb  MD  10/16/2011, 12:01 PM

## 2011-10-16 NOTE — Progress Notes (Signed)
Notified [facility] on-call MD Imogene Burn) of pt's urine output over past 12 hours ( ) and bladder scan ( ). Fluid bolus and maintenance orders received. Will continue to monitor.

## 2011-10-16 NOTE — Progress Notes (Signed)
[facility]  Daily Progress Note  Assessment/Planning: POD #2 s/p TEVAR   Continue hospitalization as continued hemoptysis and some changes in urinary production   Watch UOP as left kidney may be getting compromised by dissection  Restart MIVF  Subjective  - 2 Days Post-Op  Still coughing up blood, poor po intake  Objective Filed Vitals:   10/15/11 2305 10/16/11 0300 10/16/11 0415 10/16/11 0724  BP: 115/73  116/72   Pulse: 75  74   Temp: 97.7 F (36.5 C)  98.3 F (36.8 C) 98 F (36.7 C)  TempSrc: Oral  Oral Oral  Resp: 26  22   Height:      Weight:  262 lb 12.6 oz (119.2 kg)    SpO2: 93%  92%     Intake/Output Summary (Last 24 hours) at 10/16/11 0848 Last data filed at 10/16/11 0500  Gross per 24 hour  Intake    930 ml  Output    385 ml  Net    545 ml    PULM  CTAB CV  RRR GI  soft, NTND VASC  Palpable L brachial pulse, palpable femorals faintly, warm bilateral lower legs NEURO moving all extremities spontaneously  Laboratory CBC    Component Value Date/Time   WBC 13.5* 10/16/2011 0500   HGB 9.3* 10/16/2011 0500   HCT 29.3* 10/16/2011 0500   PLT 252 10/16/2011 0500    BMET    Component Value Date/Time   NA 129* 10/16/2011 0500   K 4.6 10/16/2011 0500   CL 93* 10/16/2011 0500   CO2 29 10/16/2011 0500   GLUCOSE 132* 10/16/2011 0500   BUN 5* 10/16/2011 0500   CREATININE 1.32 10/16/2011 0500   CALCIUM 8.7 10/16/2011 0500   GFRNONAA 55* 10/16/2011 0500   GFRAA 63* 10/16/2011 0500    Leonides Sake, MD [facility] Office: [phone] Pager: [phone]  10/16/2011, 8:48 AM

## 2011-10-16 NOTE — Progress Notes (Addendum)
                    [address]            [place] [address]          [phone]     2 Days Post-Op Procedure(s) (LRB): THORACIC AORTIC ENDOVASCULAR STENT GRAFT (N/A) VIDEO BRONCHOSCOPY (N/A) INTRAVASCULAR ULTRASOUND (N/A)  Subjective: Mild soreness in abdomen.  +productive cough with some bloody sputum.  Not eating well, but no nausea.   Objective: Vital signs in last 24 hours: Patient Vitals for the past 24 hrs:  BP Temp Temp src Pulse Resp SpO2 Weight  10/16/11 0724 - 98 F (36.7 C) Oral - - - -  10/16/11 0415 116/72 mmHg 98.3 F (36.8 C) Oral 74  22  92 % -  10/16/11 0300 - - - - - - 262 lb 12.6 oz (119.2 kg)  10/15/11 2305 115/73 mmHg 97.7 F (36.5 C) Oral 75  26  93 % -  10/15/11 1957 114/68 mmHg 98.4 F (36.9 C) Oral 75  20  94 % -  10/15/11 1617 128/76 mmHg 97.6 F (36.4 C) Oral 73  21  97 % -  10/15/11 1241 129/85 mmHg 98 F (36.7 C) Oral 67  20  96 % -  10/15/11 1200 123/69 mmHg - - 67  20  98 % -  10/15/11 1138 - 97.5 F (36.4 C) Oral - - - -  10/15/11 1100 126/72 mmHg - - 69  23  98 % -  10/15/11 1000 138/75 mmHg - - 69  23  96 % -  10/15/11 0900 144/89 mmHg - - 73  17  97 % -  10/15/11 0800 150/86 mmHg - - 69  28  97 % -   Current Weight  10/16/11 262 lb 12.6 oz (119.2 kg)     Intake/Output from previous day: 10/04 0701 - 10/05 0700 In: 960 [P.O.:840; I.V.:120] Out: 435 [Urine:435]    PHYSICAL EXAM:  Heart: RRR Lungs: few coarse BS in bases bilaterally Wound: Groin stable, no hematoma Extremities: Warm, well perfused, palpable pedal pulses Abdomen: soft, obese, nontender, nondistended, +BS    Lab Results: CBC: Basename 10/16/11 0500 10/15/11 0400  WBC 13.5* 11.4*  HGB 9.3* 9.6*  HCT 29.3* 30.1*  PLT 252 247   BMET:  Basename 10/16/11 0500 10/15/11 0400  NA 129* 132*  K 4.6 4.2  CL 93* 96  CO2 29 27  GLUCOSE 132* 147*  BUN 5* 4*  CREATININE 1.32 1.26  CALCIUM 8.7 9.0    PT/INR:  Basename 10/14/11 1130    LABPROT 17.0*  INR 1.42      Assessment/Plan: S/P Procedure(s) (LRB): THORACIC AORTIC ENDOVASCULAR STENT GRAFT (N/A) VIDEO BRONCHOSCOPY (N/A) INTRAVASCULAR ULTRASOUND (N/A) BPs generally stable.  Continue beta blocker.  ACE-I on hold while Cr is elevated. Mild leukocytosis, no fever.  Will monitor. Increase pulm toilet/IS, mobilize as tolerated. Cr up slightly this am, UOP has decreased some overnight.  Watch.   LOS: 7 days    COLLINS,GINA H 10/16/2011   much less coughing and hemoptysis I have seen and examined Leonor Liv and agree with the above assessment  and plan. No spinal heahache  Delight Ovens MD Beeper [phone] Office [phone] 10/16/2011 11:00 AM

## 2011-10-17 LAB — CBC
HCT: 27.9 % — ABNORMAL LOW (ref 39.0–52.0)
Platelets: 283 10*3/uL (ref 150–400)
RBC: 3.39 MIL/uL — ABNORMAL LOW (ref 4.22–5.81)
RDW: 14.3 % (ref 11.5–15.5)
WBC: 13.4 10*3/uL — ABNORMAL HIGH (ref 4.0–10.5)

## 2011-10-17 LAB — BASIC METABOLIC PANEL
CO2: 26 mEq/L (ref 19–32)
Calcium: 8.5 mg/dL (ref 8.4–10.5)
Chloride: 92 mEq/L — ABNORMAL LOW (ref 96–112)
Creatinine, Ser: 1.31 mg/dL (ref 0.50–1.35)
GFR calc Af Amer: 64 mL/min — ABNORMAL LOW (ref >90)
Sodium: 127 mEq/L — ABNORMAL LOW (ref 135–145)

## 2011-10-17 MED ORDER — ALBUMIN HUMAN 25 % IV SOLN
12.5000 g | Freq: Once | INTRAVENOUS | Status: AC
  Administered 2011-10-17: 12.5 g via INTRAVENOUS
  Filled 2011-10-17: qty 50

## 2011-10-17 MED ORDER — LACTULOSE 10 GM/15ML PO SOLN
20.0000 g | Freq: Every day | ORAL | Status: DC | PRN
  Filled 2011-10-17 (×2): qty 30

## 2011-10-17 MED ORDER — ACETAMINOPHEN 325 MG PO TABS: 325.0000 mg | ORAL_TABLET | ORAL | Status: DC | PRN

## 2011-10-17 MED ORDER — ACETAMINOPHEN 650 MG RE SUPP: 325.0000 mg | RECTAL | Status: DC | PRN

## 2011-10-17 MED ORDER — FUROSEMIDE 10 MG/ML IJ SOLN
40.0000 mg | Freq: Once | INTRAMUSCULAR | Status: AC
  Administered 2011-10-17: 40 mg via INTRAVENOUS
  Filled 2011-10-17: qty 4

## 2011-10-17 NOTE — Progress Notes (Signed)
Subjective:  Was able to walk short distance. + hemoptysis. T max 100.2 F  Objective:  Vital Signs in the last 24 hours: Temp:  [98.1 F (36.7 C)-100.2 F (37.9 C)] 99.3 F (37.4 C) (10/06 0700) Pulse Rate:  [83-86] 84  (10/06 0700) Cardiac Rhythm:  [-] Normal sinus rhythm (10/06 0700) Resp:  [24-27] 27  (10/06 0700) BP: (120-161)/(60-88) 139/78 mmHg (10/06 0700) SpO2:  [90 %-94 %] 92 % (10/06 0901) Weight:  [120.1 kg (264 lb 12.4 oz)] 120.1 kg (264 lb 12.4 oz) (10/06 0450)  Physical Exam: BP Readings from Last 1 Encounters:  10/17/11 139/78    Wt Readings from Last 1 Encounters:  10/17/11 120.1 kg (264 lb 12.4 oz)    Weight change: 0.9 kg (1 lb 15.8 oz)  HEENT: [place]/AT, Eyes-Brown, PERL, EOMI, Conjunctiva-Pale pink, Sclera-Non-icteric Neck: No JVD, No bruit, Trachea midline. Lungs:  Clear, Bilateral. Cardiac:  Regular rhythm, normal S1 and S2, no S3.  Abdomen:  Soft, non-tender. Extremities:  No edema present. No cyanosis. No clubbing. CNS: AxOx3, Cranial nerves grossly intact, moves all 4 extremities. Right handed. Skin: Warm and dry.   Intake/Output from previous day: 10/05 0701 - 10/06 0700 In: 2762.5 [I.V.:2762.5] Out: 1725 [Urine:1725]    Lab Results: BMET    Component Value Date/Time   NA 127* 10/17/2011 0421   K 3.8 10/17/2011 0421   CL 92* 10/17/2011 0421   CO2 26 10/17/2011 0421   GLUCOSE 92 10/17/2011 0421   BUN 4* 10/17/2011 0421   CREATININE 1.31 10/17/2011 0421   CALCIUM 8.5 10/17/2011 0421   GFRNONAA 55* 10/17/2011 0421   GFRAA 64* 10/17/2011 0421   CBC    Component Value Date/Time   WBC 13.4* 10/17/2011 0421   RBC 3.39* 10/17/2011 0421   HGB 8.9* 10/17/2011 0421   HCT 27.9* 10/17/2011 0421   PLT 283 10/17/2011 0421   MCV 82.3 10/17/2011 0421   MCH 26.3 10/17/2011 0421   MCHC 31.9 10/17/2011 0421   RDW 14.3 10/17/2011 0421   LYMPHSABS 1.5 10/09/2011 1013   MONOABS 0.5 10/09/2011 1013   EOSABS 0.1 10/09/2011 1013   BASOSABS 0.0 10/09/2011 1013   CARDIAC  ENZYMES No results found for this basename: CKTOTAL, CKMB, CKMBINDEX, TROPONINI    Assessment/Plan:  Patient Active Hospital Problem List: Status post endovascular stent of thoracic aneurysm/dissection doing well  Status post hemoptysis 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     LOS: 8 days    Orpah Cobb  MD  10/17/2011, 10:12 AM

## 2011-10-17 NOTE — Progress Notes (Addendum)
                    [address]            [place] [address]          [phone]     3 Days Post-Op Procedure(s) (LRB): THORACIC AORTIC ENDOVASCULAR STENT GRAFT (N/A) VIDEO BRONCHOSCOPY (N/A) INTRAVASCULAR ULTRASOUND (N/A)  Subjective: Coughing up a lot of bloody sputum.  Mild nausea.  Tolerating clears.   Objective: Vital signs in last 24 hours: Patient Vitals for the past 24 hrs:  BP Temp Temp src Pulse Resp SpO2 Weight  10/17/11 0700 139/78 mmHg 99.3 F (37.4 C) Oral 84  27  90 % -  10/17/11 0450 - - - - - - 264 lb 12.4 oz (120.1 kg)  10/17/11 0400 146/83 mmHg - - 86  25  92 % -  10/17/11 0358 - 98.8 F (37.1 C) Oral - - - 264 lb 12.4 oz (120.1 kg)  10/16/11 2356 120/60 mmHg 100.2 F (37.9 C) Oral 83  27  92 % -  10/16/11 2042 - - - - - 94 % -  10/16/11 2007 161/88 mmHg - - - - - -  10/16/11 2006 - 99.8 F (37.7 C) Oral 84  24  90 % -  10/16/11 1508 142/71 mmHg 98.1 F (36.7 C) Oral - - - -  10/16/11 1137 - 98.6 F (37 C) Oral - - - -   Current Weight  10/17/11 264 lb 12.4 oz (120.1 kg)     Intake/Output from previous day: 10/05 0701 - 10/06 0700 In: 2762.5 [I.V.:2762.5] Out: 1725 [Urine:1725]    PHYSICAL EXAM:  Heart: RRR Lungs: few scattered rhonchi Wound: Groins stable Extremities:Warm, well perfused    Lab Results: CBC: Basename 10/17/11 0421 10/16/11 0500  WBC 13.4* 13.5*  HGB 8.9* 9.3*  HCT 27.9* 29.3*  PLT 283 252   BMET:  Basename 10/17/11 0421 10/16/11 0500  NA 127* 129*  K 3.8 4.6  CL 92* 93*  CO2 26 29  GLUCOSE 92 132*  BUN 4* 5*  CREATININE 1.31 1.32  CALCIUM 8.5 8.7    PT/INR:  Basename 10/14/11 1130  LABPROT 17.0*  INR 1.42      Assessment/Plan: S/P Procedure(s) (LRB): THORACIC AORTIC ENDOVASCULAR STENT GRAFT (N/A) VIDEO BRONCHOSCOPY (N/A) INTRAVASCULAR ULTRASOUND (N/A) CV- BPs generally stable. Continue beta blocker. UOP improved.  Continue to monitor. Pulm toilet/IS. Hyponatremia- May  need to decrease IVF and watch Na closely. Mild renal insufficiency-Cr stable. Expected post op blood loss anemia- stable.  Watch.   LOS: 8 days    COLLINS,GINA H 10/17/2011   good lower extremity strength and function I have seen and examined Jesse Le and agree with the above assessment  and plan.  Delight Ovens MD Beeper [phone] Office [phone] 10/17/2011 9:30 AM

## 2011-10-17 NOTE — Progress Notes (Signed)
[facility]  Daily Progress Note  Assessment/Planning: POD #3 s/p TEVAR  Good response to fluid, so decrease in UOP due to volume depletion not malperfusion.  Cr also going right direction. Reduce MIVF. Drop in H/H likely hemodilution. Still producing bloody sputum, so would continue in stepdown ICU.   Even with exclusion of thoracic arterial blood flow with TEVAR, the mass effect (AKA erosion) has NOT been completely mitigated, so patient is still in a potentially precarious situation  Subjective  - 3 Days Post-Op  Decreasing bloody cough, improved intake  Objective Filed Vitals:   10/17/11 0358 10/17/11 0400 10/17/11 0450 10/17/11 0700  BP:  146/83  139/78  Pulse:  86  84  Temp: 98.8 F (37.1 C)   99.3 F (37.4 C)  TempSrc: Oral   Oral  Resp:  25  27  Height:      Weight: 264 lb 12.4 oz (120.1 kg)  264 lb 12.4 oz (120.1 kg)   SpO2:  92%  90%    Intake/Output Summary (Last 24 hours) at 10/17/11 0901 Last data filed at 10/17/11 0800  Gross per 24 hour  Intake 3007.5 ml  Output   1725 ml  Net 1282.5 ml    PULM  CTAB, obvious clot in sputum basin CV  RRR GI  soft, NTND VASC  R groin soft, without hematoma or PSA, L brachial pulse palpable  Laboratory CBC    Component Value Date/Time   WBC 13.4* 10/17/2011 0421   HGB 8.9* 10/17/2011 0421   HCT 27.9* 10/17/2011 0421   PLT 283 10/17/2011 0421    BMET    Component Value Date/Time   NA 127* 10/17/2011 0421   K 3.8 10/17/2011 0421   CL 92* 10/17/2011 0421   CO2 26 10/17/2011 0421   GLUCOSE 92 10/17/2011 0421   BUN 4* 10/17/2011 0421   CREATININE 1.31 10/17/2011 0421   CALCIUM 8.5 10/17/2011 0421   GFRNONAA 55* 10/17/2011 0421   GFRAA 64* 10/17/2011 0421    Leonides Sake, MD [facility] Office: [phone] Pager: [phone]  10/17/2011, 9:01 AM

## 2011-10-17 NOTE — Progress Notes (Deleted)
Vital signs prior to administration of albumin

## 2011-10-18 DIAGNOSIS — Z09 Encounter for follow-up examination after completed treatment for conditions other than malignant neoplasm: Secondary | ICD-10-CM

## 2011-10-18 LAB — CBC
HCT: 28.2 % — ABNORMAL LOW (ref 39.0–52.0)
Hemoglobin: 9 g/dL — ABNORMAL LOW (ref 13.0–17.0)
MCV: 81.3 fL (ref 78.0–100.0)
RBC: 3.47 MIL/uL — ABNORMAL LOW (ref 4.22–5.81)
WBC: 10.7 10*3/uL — ABNORMAL HIGH (ref 4.0–10.5)

## 2011-10-18 LAB — TYPE AND SCREEN
ABO/RH(D): B POS
Antibody Screen: NEGATIVE
Unit division: 0

## 2011-10-18 LAB — BASIC METABOLIC PANEL
CO2: 28 mEq/L (ref 19–32)
Chloride: 90 mEq/L — ABNORMAL LOW (ref 96–112)
GFR calc Af Amer: 66 mL/min — ABNORMAL LOW (ref >90)
Potassium: 3.8 mEq/L (ref 3.5–5.1)

## 2011-10-18 MED ORDER — SODIUM CHLORIDE 0.9 % IV SOLN
INTRAVENOUS | Status: DC
  Administered 2011-10-18: 19:00:00 via INTRAVENOUS

## 2011-10-18 NOTE — Progress Notes (Signed)
[facility]  Subjective  - POD #4 s/p TEVAR  Events of weekend noted   Physical Exam:  Abd soft Pedal pulses palpable       Assessment/Plan:  POD #4  With renal issues over the weekend, I will check a renal duplex today to evaluate flow to his left kidney, which was perfused by there false lumen.  Will keep him NPO until the study is done and then he can eat  Continue with supportive care  HCT appears to have stabilized  Neuro-  Remains intact  Monzerat Handler IV, V. WELLS 10/18/2011 7:29 AM --  Filed Vitals:   10/18/11 0728  BP:   Pulse:   Temp: 99.3 F (37.4 C)  Resp:     Intake/Output Summary (Last 24 hours) at 10/18/11 0729 Last data filed at 10/18/11 0600  Gross per 24 hour  Intake   2380 ml  Output   2550 ml  Net   -170 ml     Laboratory CBC    Component Value Date/Time   WBC 10.7* 10/18/2011 0445   HGB 9.0* 10/18/2011 0445   HCT 28.2* 10/18/2011 0445   PLT 283 10/18/2011 0445    BMET    Component Value Date/Time   NA 127* 10/18/2011 0445   K 3.8 10/18/2011 0445   CL 90* 10/18/2011 0445   CO2 28 10/18/2011 0445   GLUCOSE 124* 10/18/2011 0445   BUN 6 10/18/2011 0445   CREATININE 1.27 10/18/2011 0445   CALCIUM 8.8 10/18/2011 0445   GFRNONAA 57* 10/18/2011 0445   GFRAA 66* 10/18/2011 0445    COAG Lab Results  Component Value Date   INR 1.42 10/14/2011   INR 1.27 10/13/2011   INR 1.33 10/10/2011   No results found for this basename: PTT    Antibiotics Anti-infectives     Start     Dose/Rate Route Frequency Ordered Stop   10/14/11 2000   cefUROXime (ZINACEF) 1.5 g in dextrose 5 % 50 mL IVPB        1.5 g 100 mL/hr over 30 Minutes Intravenous Every 12 hours 10/14/11 1243 10/15/11 1104   10/14/11 0600   cefUROXime (ZINACEF) 1.5 g in dextrose 5 % 50 mL IVPB        1.5 g 100 mL/hr over 30 Minutes Intravenous On call to O.R. 10/13/11 1849 10/14/11 0837   10/11/11 1300   amoxicillin-clavulanate (AUGMENTIN) 875-125 MG per  tablet 1 tablet        1 tablet Oral 2 times daily 10/11/11 1238             V. Durene Cal IV, M.D. [facility] Office: [phone] Pager:  [phone]

## 2011-10-18 NOTE — Progress Notes (Signed)
Subjective:  Patient denies any chest pain shortness of breath hemoptysis. Denies any abdominal or flank pain.  Objective:  Vital Signs in the last 24 hours: Temp:  [98 F (36.7 C)-99.3 F (37.4 C)] 98 F (36.7 C) (10/07 1542) Pulse Rate:  [69-86] 73  (10/07 1300) Resp:  [18-25] 22  (10/07 1300) BP: (107-150)/(64-88) 128/74 mmHg (10/07 1300) SpO2:  [94 %-96 %] 96 % (10/07 1200) Weight:  [119.8 kg (264 lb 1.8 oz)] 119.8 kg (264 lb 1.8 oz) (10/07 0453)  Intake/Output from previous day: 10/06 0701 - 10/07 0700 In: 2430 [P.O.:1080; I.V.:1350] Out: 2550 [Urine:2550] Intake/Output from this shift: Total I/O In: 300 [I.V.:300] Out: 400 [Urine:400]  Physical Exam: Neck: no adenopathy, no carotid bruit, no JVD and supple, symmetrical, trachea midline Lungs: clear to auscultation bilaterally Heart: regular rate and rhythm, S1, S2 normal and Soft systolic murmur noted no S3 gallop Abdomen: soft, non-tender; bowel sounds normal; no masses,  no organomegaly Extremities: extremities normal, atraumatic, no cyanosis or edema  Lab Results:  Basename 10/18/11 0445 10/17/11 0421  WBC 10.7* 13.4*  HGB 9.0* 8.9*  PLT 283 283    Basename 10/18/11 0445 10/17/11 0421  NA 127* 127*  K 3.8 3.8  CL 90* 92*  CO2 28 26  GLUCOSE 124* 92  BUN 6 4*  CREATININE 1.27 1.31   No results found for this basename: TROPONINI:2,CK,MB:2 in the last 72 hours Hepatic Function Panel No results found for this basename: PROT,ALBUMIN,AST,ALT,ALKPHOS,BILITOT,BILIDIR,IBILI in the last 72 hours No results found for this basename: CHOL in the last 72 hours No results found for this basename: PROTIME in the last 72 hours  Imaging: Imaging results have been reviewed and No results found.  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  LOS: 9 days    Akaila Rambo N 10/18/2011, 5:48 PM

## 2011-10-18 NOTE — Progress Notes (Addendum)
4 Days Post-Op Procedure(s) (LRB): THORACIC AORTIC ENDOVASCULAR STENT GRAFT (N/A) VIDEO BRONCHOSCOPY (N/A) INTRAVASCULAR ULTRASOUND (N/A) Subjective:  Jesse Le complains of neck and upper extremity pain this morning.  He has not had a bowel movement, but is passing Flatus.  Objective: Vital signs in last 24 hours: Temp:  [97.9 F (36.6 C)-99.3 F (37.4 C)] 99.3 F (37.4 C) (10/07 0728) Pulse Rate:  [73-87] 86  (10/07 0300) Cardiac Rhythm:  [-] Normal sinus rhythm (10/07 0300) Resp:  [21-29] 21  (10/07 0300) BP: (107-150)/(64-88) 150/88 mmHg (10/07 0300) SpO2:  [92 %-96 %] 95 % (10/07 0300) Weight:  [264 lb 1.8 oz (119.8 kg)] 264 lb 1.8 oz (119.8 kg) (10/07 0453)    Intake/Output from previous day: 10/06 0701 - 10/07 0700 In: 2380 [P.O.:1080; I.V.:1300] Out: 2550 [Urine:2550]  General appearance: alert, cooperative and no distress Heart: regular rate and rhythm Lungs: clear to auscultation bilaterally Abdomen: soft, non-tender; bowel sounds normal; no masses,  no organomegaly Extremities: extremities normal, atraumatic, no cyanosis or edema Wound: clean and dry  Lab Results:  [facility] 10/18/11 0445 10/17/11 0421  WBC 10.7* 13.4*  HGB 9.0* 8.9*  HCT 28.2* 27.9*  PLT 283 283   BMET:  Basename 10/18/11 0445 10/17/11 0421  NA 127* 127*  K 3.8 3.8  CL 90* 92*  CO2 28 26  GLUCOSE 124* 92  BUN 6 4*  CREATININE 1.27 1.31  CALCIUM 8.8 8.5    PT/INR: No results found for this basename: LABPROT,INR in the last 72 hours ABG No results found for this basename: phart, pco2, po2, hco3, tco2, acidbasedef, o2sat   CBG (last 3)  No results found for this basename: GLUCAP:3 in the last 72 hours  Assessment/Plan: S/P Procedure(s) (LRB): THORACIC AORTIC ENDOVASCULAR STENT GRAFT (N/A) VIDEO BRONCHOSCOPY (N/A) INTRAVASCULAR ULTRASOUND (N/A)  1. CV- NSR, HTN this morning, blood pressure running 150s SBP, on Lopressor 100mg  Daily, may benefit from additional agent 2.  Hyponatremia- IV fluid currently at 50cc per hour, may need to decrease or discontinue, will monitor NA 3. Renal Insufficiency- creatinine stable 4. Acute post operative anemia- stable 5. Diet- advance as tolerated   LOS: 9 days    Jesse Le 10/18/2011  Not coughing up blood today, Cr stable I have seen and examined Leonor Liv and agree with the above assessment  and plan.  Delight Ovens MD Beeper [phone] Office [phone] 10/18/2011 3:22 PM

## 2011-10-18 NOTE — Progress Notes (Signed)
Utilization review completed.  

## 2011-10-18 NOTE — Progress Notes (Signed)
*  PRELIMINARY RESULTS* Vascular Ultrasound Renal Artery Duplex has been completed.  Preliminary findings: No renal artery stenosis with normal perfusion to right kidney. There does not appear to be any perfusion to left kidney and left renal arteries cannot be identified with color Doppler.  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   Attempted to page Dr. Myra Gianotti twice with no response. Leaving results with RN with instructions to pass results to Dr. Myra Gianotti.  Jesse Le, RDMS, RVT  10/18/2011, 9:42 AM

## 2011-10-19 LAB — BASIC METABOLIC PANEL
BUN: 6 mg/dL (ref 6–23)
CO2: 27 mEq/L (ref 19–32)
Chloride: 98 mEq/L (ref 96–112)
GFR calc Af Amer: 82 mL/min — ABNORMAL LOW (ref >90)
Glucose, Bld: 109 mg/dL — ABNORMAL HIGH (ref 70–99)
Potassium: 3.9 mEq/L (ref 3.5–5.1)

## 2011-10-19 LAB — CBC
HCT: 26.3 % — ABNORMAL LOW (ref 39.0–52.0)
Hemoglobin: 8.3 g/dL — ABNORMAL LOW (ref 13.0–17.0)
MCH: 25.4 pg — ABNORMAL LOW (ref 26.0–34.0)
MCHC: 31.6 g/dL (ref 30.0–36.0)
MCV: 80.4 fL (ref 78.0–100.0)

## 2011-10-19 MED ORDER — METOPROLOL SUCCINATE ER 100 MG PO TB24
100.0000 mg | ORAL_TABLET | Freq: Every day | ORAL | Status: DC
  Administered 2011-10-20 – 2011-10-21 (×2): 100 mg via ORAL
  Filled 2011-10-19 (×2): qty 1

## 2011-10-19 MED ORDER — AMLODIPINE BESYLATE 5 MG PO TABS
5.0000 mg | ORAL_TABLET | Freq: Every day | ORAL | Status: DC
  Administered 2011-10-19 – 2011-10-21 (×3): 5 mg via ORAL
  Filled 2011-10-19 (×3): qty 1

## 2011-10-19 MED ORDER — METOPROLOL SUCCINATE ER 50 MG PO TB24
50.0000 mg | ORAL_TABLET | Freq: Every day | ORAL | Status: DC
  Administered 2011-10-19 – 2011-10-20 (×2): 50 mg via ORAL
  Filled 2011-10-19 (×3): qty 1

## 2011-10-19 NOTE — Progress Notes (Signed)
Pt Hr dropped to 37, woke pt up hr back to 60-70's denies any complaints BP 159/86. Pt went back to sleep HR holding at 58-63. Pt on lopressor 100 mg BID will continue to monitor

## 2011-10-19 NOTE — Progress Notes (Addendum)
                   [address]            [place] [address]          [phone]      5 Days Post-Op Procedure(s) (LRB): THORACIC AORTIC ENDOVASCULAR STENT GRAFT (N/A) VIDEO BRONCHOSCOPY (N/A) INTRAVASCULAR ULTRASOUND (N/A)  Subjective: Patient with bowel movement, no hemoptysis for two days  Objective: Vital signs in last 24 hours: Blood pressure 159/82, pulse 61, temperature 96.2 F (35.7 C), temperature source Oral, resp. rate 14, height 6\' 2"  (1.88 m), weight 264 lb 1.8 oz (119.8 kg), SpO2 93.00%.  Current Weight  10/18/11 264 lb 1.8 oz (119.8 kg)       Intake/Output from previous day: 10/07 0701 - 10/08 0700 In: 1590 [P.O.:480; I.V.:1110] Out: 1100 [Urine:1100]   Physical Exam:  Cardiovascular: RRR Pulmonary: Clear to auscultation bilaterally; no rales, wheezes, or rhonchi. Abdomen: Soft, non tender, bowel sounds present. Extremities: Mild bilateral lower extremity edema. Wounds: Clean and dry.    Lab Results: CBC: Basename 10/19/11 0450 10/18/11 0445  WBC 9.2 10.7*  HGB 8.3* 9.0*  HCT 26.3* 28.2*  PLT 281 283   BMET:  Basename 10/19/11 0450 10/18/11 0445  NA 133* 127*  K 3.9 3.8  CL 98 90*  CO2 27 28  GLUCOSE 109* 124*  BUN 6 6  CREATININE 1.07 1.27  CALCIUM 8.6 8.8    PT/INR:  Lab Results  Component Value Date   INR 1.42 10/14/2011   INR 1.27 10/13/2011   INR 1.33 10/10/2011   ABG:  INR: Will add last result for INR, ABG once components are confirmed Will add last 4 CBG results once components are confirmed  Assessment/Plan:  1. CV - Had bradycardia last evening into 30-40's.SR this am. Continue Toprol XL 100 bid and Nitro patch. Will need another medicine as SBP remaining in the 160's. Was on Lisinopril pre op. 2. Pulmonary - Encourage incentive spirometer 3. Acute blood loss anemia - H and H this am decreased to 8.3 and 26.3 4.GI- on full liquids and IVF at 50 cc/hr.Advance diet. 5.Mild hyponatremia- Sodium up to  133.Heplock IVF 6.Renal insufficiency- creatinine now normal 1.07, [place] of left kidney noted, renal function stable. Suspect left kidney was not functional or minimally functional prior to stent  Mild headache intermittently, watch for spinal headache   ZIMMERMAN,DONIELLE MPA-C 10/19/2011,8:15 AM  Cardiology to adjust BP meds/beta blocker I have seen and examined Leonor Liv and agree with the above assessment  and plan.  Delight Ovens MD [facility]per [phone] Office [phone] 10/19/2011 9:30 AM

## 2011-10-19 NOTE — Progress Notes (Signed)
Subjective:  Patient denies any chest pain or shortness of breath. Denies any abdominal pain. Had episode of nonsustained the sinus bradycardia last night. Blood pressure is still mildly elevated. Renal function improved  Objective:  Vital Signs in the last 24 hours: Temp:  [97.8 F (36.6 C)-98.8 F (37.1 C)] 98.2 F (36.8 C) (10/08 0738) Pulse Rate:  [49-73] 61  (10/08 0735) Resp:  [14-26] 19  (10/08 0735) BP: (124-164)/(67-95) 164/79 mmHg (10/08 0735) SpO2:  [93 %-99 %] 98 % (10/08 0906)  Intake/Output from previous day: 10/07 0701 - 10/08 0700 In: 1590 [P.O.:480; I.V.:1110] Out: 1100 [Urine:1100] Intake/Output from this shift: Total I/O In: 390 [P.O.:240; I.V.:150] Out: 100 [Urine:100]  Physical Exam: Neck: no adenopathy, no carotid bruit, no JVD and supple, symmetrical, trachea midline Lungs: clear to auscultation bilaterally Heart: regular rate and rhythm, S1, S2 normal and Soft systolic murmur noted Abdomen: soft, non-tender; bowel sounds normal; no masses,  no organomegaly Extremities: extremities normal, atraumatic, no cyanosis or edema and Right groin stable  Lab Results:  Basename 10/19/11 0450 10/18/11 0445  WBC 9.2 10.7*  HGB 8.3* 9.0*  PLT 281 283    Basename 10/19/11 0450 10/18/11 0445  NA 133* 127*  K 3.9 3.8  CL 98 90*  CO2 27 28  GLUCOSE 109* 124*  BUN 6 6  CREATININE 1.07 1.27   No results found for this basename: TROPONINI:2,CK,MB:2 in the last 72 hours Hepatic Function Panel No results found for this basename: PROT,ALBUMIN,AST,ALT,ALKPHOS,BILITOT,BILIDIR,IBILI in the last 72 hours No results found for this basename: CHOL in the last 72 hours No results found for this basename: PROTIME in the last 72 hours  Imaging: Imaging results have been reviewed and No results found.  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

## 2011-10-20 ENCOUNTER — Other Ambulatory Visit: Payer: Self-pay | Admitting: *Deleted

## 2011-10-20 DIAGNOSIS — Z48812 Encounter for surgical aftercare following surgery on the circulatory system: Secondary | ICD-10-CM

## 2011-10-20 DIAGNOSIS — I712 Thoracic aortic aneurysm, without rupture: Secondary | ICD-10-CM

## 2011-10-20 NOTE — Progress Notes (Signed)
Subjective:  Patient denies any chest pain or shortness of breath. Appetite remains poor  Objective:  Vital Signs in the last 24 hours: Temp:  [97.6 F (36.4 C)-98.7 F (37.1 C)] 98.1 F (36.7 C) (10/09 1550) Pulse Rate:  [63-76] 74  (10/09 1120) Resp:  [16-23] 19  (10/09 1120) BP: (128-158)/(69-79) 143/74 mmHg (10/09 1120) SpO2:  [95 %-99 %] 99 % (10/09 1120) Weight:  [111.9 kg (246 lb 11.1 oz)] 111.9 kg (246 lb 11.1 oz) (10/09 0749)  Intake/Output from previous day: 10/08 0701 - 10/09 0700 In: 873 [P.O.:720; I.V.:153] Out: 1120 [Urine:1120] Intake/Output from this shift: Total I/O In: -  Out: 925 [Urine:925]  Physical Exam: Neck: no adenopathy, no carotid bruit, no JVD and supple, symmetrical, trachea midline Lungs: clear to auscultation bilaterally Heart: regular rate and rhythm, S1, S2 normal and Soft systolic murmur noted no S3 gallop Abdomen: soft, non-tender; bowel sounds normal; no masses,  no organomegaly Extremities: extremities normal, atraumatic, no cyanosis or edema and Right groin stable  Lab Results:  Basename 10/19/11 0450 10/18/11 0445  WBC 9.2 10.7*  HGB 8.3* 9.0*  PLT 281 283    Basename 10/19/11 0450 10/18/11 0445  NA 133* 127*  K 3.9 3.8  CL 98 90*  CO2 27 28  GLUCOSE 109* 124*  BUN 6 6  CREATININE 1.07 1.27   No results found for this basename: TROPONINI:2,CK,MB:2 in the last 72 hours Hepatic Function Panel No results found for this basename: PROT,ALBUMIN,AST,ALT,ALKPHOS,BILITOT,BILIDIR,IBILI in the last 72 hours No results found for this basename: CHOL in the last 72 hours No results found for this basename: PROTIME in the last 72 hours  Imaging: Imaging results have been reviewed and No results found.  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

## 2011-10-20 NOTE — Progress Notes (Addendum)
                    [address]            [place] [id number]          [phone]     6 Days Post-Op Procedure(s) (LRB): THORACIC AORTIC ENDOVASCULAR STENT GRAFT (N/A) VIDEO BRONCHOSCOPY (N/A) INTRAVASCULAR ULTRASOUND (N/A)  Subjective: Feels ok this am.  Not eating much, but bowels working.  No further hemoptysis.   Objective: Vital signs in last 24 hours: Patient Vitals for the past 24 hrs:  BP Temp Temp src Pulse Resp SpO2 Weight  10/20/11 0749 - - - - - - 246 lb 11.1 oz (111.9 kg)  10/20/11 0737 - 98.7 F (37.1 C) Oral - - - -  10/20/11 0402 128/69 mmHg 98.2 F (36.8 C) Oral 71  16  98 % -  10/20/11 0400 - - - 66  22  96 % -  10/20/11 0005 145/79 mmHg 97.7 F (36.5 C) Oral 71  18  96 % -  10/20/11 0000 - - - 68  23  97 % -  10/19/11 2104 130/74 mmHg - - 71  - - -  10/19/11 2032 - - - - - 97 % -  10/19/11 2000 143/75 mmHg 98.6 F (37 C) Oral 63  18  97 % -  10/19/11 1920 143/75 mmHg - - 69  21  97 % -  10/19/11 1531 144/77 mmHg 98.4 F (36.9 C) Oral 67  19  97 % -  10/19/11 1528 - 98.4 F (36.9 C) Oral - - - -  10/19/11 1432 158/89 mmHg - - 67  19  96 % -  10/19/11 1155 - 97.7 F (36.5 C) Oral - - - -  10/19/11 1130 163/89 mmHg - - 57  19  96 % -  10/19/11 1046 136/75 mmHg - - 54  21  96 % -  10/19/11 0906 - - - - - 98 % -   Current Weight  10/20/11 246 lb 11.1 oz (111.9 kg)     Intake/Output from previous day: 10/08 0701 - 10/09 0700 In: 873 [P.O.:720; I.V.:153] Out: 1120 [Urine:1120]    PHYSICAL EXAM:  Heart: RRR Lungs: Clear Wound: Groin stable Extremities: Warm, well perfused    Lab Results: CBC: Basename 10/19/11 0450 10/18/11 0445  WBC 9.2 10.7*  HGB 8.3* 9.0*  HCT 26.3* 28.2*  PLT 281 283   BMET:  Basename 10/19/11 0450 10/18/11 0445  NA 133* 127*  K 3.9 3.8  CL 98 90*  CO2 27 28  GLUCOSE 109* 124*  BUN 6 6  CREATININE 1.07 1.27  CALCIUM 8.6 8.8    PT/INR: No results found for this basename: LABPROT,INR  in the last 72 hours    Assessment/Plan: S/P Procedure(s) (LRB): THORACIC AORTIC ENDOVASCULAR STENT GRAFT (N/A) VIDEO BRONCHOSCOPY (N/A) INTRAVASCULAR ULTRASOUND (N/A) Cr stable, UOP good.  Continue to monitor. HTN- BPs generally stable, no more bradycardia.  Dr. Sharyn Lull following. Expected postop blood loss anemia- will monitor.   Possibly ready for d/c soon.   LOS: 11 days    COLLINS,GINA H 10/20/2011   renal function stable Home soon  I have seen and examined Leonor Liv and agree with the above assessment  and plan.  Delight Ovens MD Beeper [phone] Office [phone] 10/20/2011 2:52 PM

## 2011-10-21 LAB — CBC
Hemoglobin: 9 g/dL — ABNORMAL LOW (ref 13.0–17.0)
MCH: 25.6 pg — ABNORMAL LOW (ref 26.0–34.0)
MCHC: 32.1 g/dL (ref 30.0–36.0)

## 2011-10-21 LAB — BASIC METABOLIC PANEL
BUN: 7 mg/dL (ref 6–23)
Creatinine, Ser: 1.26 mg/dL (ref 0.50–1.35)
GFR calc non Af Amer: 58 mL/min — ABNORMAL LOW (ref >90)
Glucose, Bld: 104 mg/dL — ABNORMAL HIGH (ref 70–99)
Potassium: 3.8 mEq/L (ref 3.5–5.1)

## 2011-10-21 MED ORDER — METOPROLOL SUCCINATE ER 50 MG PO TB24: 50.0000 mg | ORAL_TABLET | Freq: Every day | ORAL | Status: DC

## 2011-10-21 MED ORDER — NITROGLYCERIN 0.4 MG/HR TD PT24: 30.0000 | MEDICATED_PATCH | Freq: Every day | TRANSDERMAL | Status: DC

## 2011-10-21 MED ORDER — METOPROLOL SUCCINATE ER 100 MG PO TB24: 100.0000 mg | ORAL_TABLET | Freq: Every day | ORAL | Status: DC

## 2011-10-21 MED ORDER — AMLODIPINE BESYLATE 5 MG PO TABS: 5.0000 mg | ORAL_TABLET | Freq: Every day | ORAL | Status: DC

## 2011-10-21 MED ORDER — PANTOPRAZOLE SODIUM 40 MG PO TBEC: 40.0000 mg | DELAYED_RELEASE_TABLET | Freq: Every day | ORAL | Status: DC

## 2011-10-21 NOTE — Progress Notes (Signed)
10/21/2011 1:46 PM  Discussed discharge instructions and medications with patient and family member. Answered all questions. Vss. Pt has no complaints of pain. Surgical site is WNL. PIV is dc'ed with tip intact. Pt discharged home with family.  Celesta Gentile 10/10/20131:46 PM

## 2011-10-21 NOTE — Discharge Summary (Signed)
  Discharge summary dictated on 10/21/2011 dictation number is [phone]

## 2011-10-21 NOTE — Care Management Note (Signed)
    Page 1 of 2   10/21/2011     3:03:14 PM   CARE MANAGEMENT NOTE 10/21/2011  Patient:  Jesse Le,Jesse Le   Account Number:  [id number]  Date Initiated:  10/13/2011  Documentation initiated by:  Donn Pierini  Subjective/Objective Assessment:   Pt admitted with Th. aortic aneurysm- not an open surgical cand. awaiting vascular  for possible candidate for endovascular repair - PC consulted     Action/Plan:   PTA pt lived at home,   Anticipated DC Date:  10/21/2011   Anticipated DC Plan:  HOME/SELF CARE      DC Planning Services  CM consult      PAC Choice  [employer]MENT   Choice offered to / List presented to:     DME arranged  [facility]      DME agency  [facility]        Status of service:  Completed, signed off Medicare Important Message given?   (If response is "NO", the following Medicare IM given date fields will be blank) Date Medicare IM given:   Date Additional Medicare IM given:    Discharge Disposition:  HOME/SELF CARE  Per UR Regulation:  Reviewed for med. necessity/level of care/duration of stay  If discussed at Long Length of Stay Meetings, dates discussed:   10/19/2011  10/21/2011    Comments:  PCP- Rinaldo Cloud N  10/21/11- 1200- Donn Pierini RN, BSN [phone] Pt for d/c home today, order for RW placed per MD- [facility] to deliver to room prior to discharge  10/14/11- 1200- Donn Pierini RN, BSN [phone] Pt for endovascular stent today for repair of TAA, pt to go to ICU post procedure- NCM to cont. to follow for d/c needs

## 2011-10-21 NOTE — Progress Notes (Addendum)
                    [address]            [place] [address]          [phone]     7 Days Post-Op Procedure(s) (LRB): THORACIC AORTIC ENDOVASCULAR STENT GRAFT (N/A) VIDEO BRONCHOSCOPY (N/A) INTRAVASCULAR ULTRASOUND (N/A)  Subjective: Feels well, no complaints.  Walking in halls, tolerating diet.  No abd pain or hemoptysis.   Objective: Vital signs in last 24 hours: Patient Vitals for the past 24 hrs:  BP Temp Temp src Pulse Resp SpO2 Weight  10/21/11 0738 134/80 mmHg - - 72  26  95 % -  10/21/11 0500 - - - - - - 246 lb 4.1 oz (111.7 kg)  10/21/11 0400 117/81 mmHg 98.7 F (37.1 C) Oral 73  20  97 % -  10/21/11 0016 146/87 mmHg 98.4 F (36.9 C) Oral 73  32  96 % -  10/21/11 0000 - - - 75  24  95 % -  10/20/11 2253 134/77 mmHg - - 75  - - -  10/20/11 2000 - - - 72  10  97 % -  10/20/11 1944 136/69 mmHg 98.2 F (36.8 C) Oral 77  17  97 % -  10/20/11 1929 - - - - - 99 % -  10/20/11 1550 128/75 mmHg 98.1 F (36.7 C) Oral 76  21  96 % -  10/20/11 1120 143/74 mmHg 97.6 F (36.4 C) Oral 74  19  99 % -  10/20/11 1111 143/74 mmHg - - 76  - - -  10/20/11 0816 - - - - - 97 % -   Current Weight  10/21/11 246 lb 4.1 oz (111.7 kg)     Intake/Output from previous day: 10/09 0701 - 10/10 0700 In: 243 [P.O.:240; I.V.:3] Out: 1500 [Urine:1500]    PHYSICAL EXAM:  Heart: RRR Lungs: Clear Wound: groins stable Extremities: warm and well perfused   Lab Results: CBC: Basename 10/21/11 0500 10/19/11 0450  WBC 10.6* 9.2  HGB 9.0* 8.3*  HCT 28.0* 26.3*  PLT 393 281   BMET:  Basename 10/21/11 0500 10/19/11 0450  NA 129* 133*  K 3.8 3.9  CL 92* 98  CO2 26 27  GLUCOSE 104* 109*  BUN 7 6  CREATININE 1.26 1.07  CALCIUM 9.3 8.6    PT/INR: No results found for this basename: LABPROT,INR in the last 72 hours    Assessment/Plan: S/P Procedure(s) (LRB): THORACIC AORTIC ENDOVASCULAR STENT GRAFT (N/A) VIDEO BRONCHOSCOPY (N/A) INTRAVASCULAR ULTRASOUND  (N/A) Cr back up slightly today, UOP remains stable.  Continue to monitor. HTN- BPs fairly well controlled.  Continue current meds. Ok from cardiac surgery standpoint for d/c home at any time.  Outpatient followup arranged with vascular surgery.   LOS: 12 days    COLLINS,GINA H 10/21/2011   discussed with Dr Myra Gianotti, he will see in one month with ct of chest, I will see in 7 months. I have seen and examined Leonor Liv and agree with the above assessment  and plan.  Delight Ovens MD Beeper [phone] Office [phone] 10/21/2011 9:01 AM

## 2011-10-22 ENCOUNTER — Telehealth: Payer: Self-pay | Admitting: Surgery

## 2011-10-22 NOTE — Discharge Summary (Signed)
NAMEJUWAUN, INSKEEP NO.:  [id number]  MEDICAL RECORD NO.:  [id number]  LOCATION:  3309                         FACILITY:  [facility]  PHYSICIAN:  Eduardo Osier. Sharyn Lull, M.D. DATE OF BIRTH:  1945/07/08  DATE OF ADMISSION:  10/09/2011 DATE OF DISCHARGE:  10/21/2011                              DISCHARGE SUMMARY   ADMITTING DIAGNOSES: 1. Thoracic aneurysm with dissection. 2. 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 3. Hypertension. 4. Chronic obstructive pulmonary disease. 5. Mild coronary artery disease. 6. Hypercholesteremia.  DISCHARGE DIAGNOSES: 1. Status post endovascular stent graft of thoracic     aneurysm/dissection. 2. Status post hemoptysis, questionable aorto-bronchial fistula. 3. Hypertension. 4. Chronic obstructive pulmonary disease. 5. Mild coronary artery disease. 6. Anemia, stable. 7. Resolving hyponatremia. 8. Mild renal insufficiency, stable. 9. 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 10.Morbid obesity.  DISCHARGE HOME MEDICATIONS: 1. Amlodipine 5 mg 1 tablet daily. 2. Toprol-XL 100 mg in the morning and 50 mg at night. 3. Nitro-Dur 0.4 mg/hour 1 patch daily. 4. Protonix 40 mg 1 tablet daily. 5. Enteric-coated aspirin 81 mg 1 tablet daily. 6. Simvastatin 20 mg 1 tablet daily.  The patient has been advised to     stop atenolol and lisinopril for now.  DIET:  Low salt, low cholesterol.  ACTIVITY:  Increase activity slowly as tolerated.  Follow up with me in 1 week.  Follow up with Dr. Myra Gianotti in 4 weeks.  CONDITION AT DISCHARGE:  Stable.  BRIEF HISTORY AND HOSPITAL COURSE:  Mr. Spiers is a 66 year old male with past medical history significant for mild coronary artery disease, hypertension, hypercholesteremia, morbid obesity, was admitted by Dr. Algie Coffer on October 09, 2011, because of 1-day history of cough associated with hemoptysis.  The patient denies any chest pain.  CT of the chest was done in the ED, which showed large descending  thoracic aneurysm with mass effect on the left lower lobe bronchus and lower esophagus associated with dissection.  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  PHYSICAL EXAMINATION:  VITAL SIGNS:  His blood pressure was 151/116, pulse was 86, he was afebrile. HEENT:  Head was normocephalic, atraumatic.  Conjunctiva was normal. Extraocular movements were normal. NECK:  Supple.  No JVD. CARDIOVASCULAR:  Normal rate, rhythm, intact distal pulses.  There was 2/6 systolic murmur. LUNGS:  Clear to auscultation without rhonchi or rales. ABDOMEN:  Soft.  Bowel sounds were normal.  Nontender. EXTREMITIES:  There was no clubbing, cyanosis, or edema.  LABORATORY DATA:  Sodium was 132, potassium 4.0, BUN 6, creatinine 0.90. Hemoglobin was 11.1, hematocrit 35.0, white count of 9.7.  His EKG showed normal sinus rhythm with nonspecific T-wave changes.  A 2D echo showed LV size was normal.  There was moderate concentric hypertrophy. Systolic function was mildly reduced, EF of 45-50%.  There was trivial aortic regurgitation.  No mitral regurgitation.  Ascending aorta was mildly enlarged by 2D echo.  Renal duplex ultrasound showed thoracoabdominal aortic aneurysm with dissection extending into the left common and external iliac arteries.  Left kidney shows also decreased perfusion with left renal artery  arising from a poorly perfused channel in the largely thrombosed false lumen.  The IMA also arises from the false lumen shows no opacification proximally suggesting thrombosis. Renal duplex ultrasound showed no evidence of stenosis in the right renal artery, adequate perfusion to the right kidney, does not appear to be any perfusion to the left kidney, could not ascertain left renal artery on color Doppler.  BRIEF HOSPITAL COURSE:  The patient was admitted to Step-Down  Unit. CVTS and Vascular Surgical consultation was obtained.  Initially, the patient was felt was not the candidate for any intervention and palliative consult was called.  Upon further review of the films by Vascular Surgery, the patient was felt to be the candidate for endovascular repair of the aortic dissection, which the patient successfully underwent on October 14, 2011, the patient tolerated the procedure well.  Postprocedure, the patient did not have further hemoptysis.  His renal function has been fairly stable.  OT/PT consultation was obtained.  The patient is ambulating slowly.  The patient's right groin is stable with no evidence of hematoma, it has healed well.  His blood pressure has been fairly well controlled.  The patient's diet has been poor, which has been improving slowly.  The patient will be discharged home on above medications and will be followed up by Vascular Surgery in 4 weeks and with me next week.  The patient has been advised to call [phone] if he develops any severe abdominal pain, hemoptysis.  The patient has also been advised to monitor blood pressure daily at home.     Eduardo Osier. Sharyn Lull, M.D.     [facility]  D:  10/21/2011  T:  10/22/2011  Job:  [id number]

## 2011-10-22 NOTE — Telephone Encounter (Signed)
Message copied by Margaretmary Eddy on Fri Oct 22, 2011 11:46 AM ------      Message from: Melene Plan      Created: Wed Oct 20, 2011 11:45 AM                   ----- Message -----         From: [place], [place]         Sent: 10/20/2011  11:34 AM           To: Melene Plan, RN            4 weeks S/P TEVAR            Needs CTA Chest, ABD/Pelvis

## 2011-11-15 ENCOUNTER — Other Ambulatory Visit: Payer: Medicare Other

## 2011-11-15 ENCOUNTER — Ambulatory Visit: Payer: Medicare Other | Admitting: Surgery

## 2011-11-18 ENCOUNTER — Other Ambulatory Visit: Payer: Self-pay | Admitting: *Deleted

## 2011-11-18 DIAGNOSIS — Z48812 Encounter for surgical aftercare following surgery on the circulatory system: Secondary | ICD-10-CM

## 2011-11-18 DIAGNOSIS — I712 Thoracic aortic aneurysm, without rupture: Secondary | ICD-10-CM

## 2011-11-19 ENCOUNTER — Encounter: Payer: Self-pay | Admitting: Surgery

## 2011-11-22 ENCOUNTER — Ambulatory Visit (INDEPENDENT_AMBULATORY_CARE_PROVIDER_SITE_OTHER): Payer: Medicare Other | Admitting: Surgery

## 2011-11-22 ENCOUNTER — Other Ambulatory Visit: Payer: Self-pay | Admitting: Surgery

## 2011-11-22 ENCOUNTER — Encounter: Payer: Self-pay | Admitting: Surgery

## 2011-11-22 ENCOUNTER — Ambulatory Visit
Admission: RE | Admit: 2011-11-22 | Discharge: 2011-11-22 | Disposition: A | Discharge status: Home / Self Care | Payer: Medicare Other | Source: Ambulatory Visit | Attending: Neurosurgery | Admitting: Neurosurgery

## 2011-11-22 VITALS — BP 169/90 | HR 84 | Temp 97.6°F | Resp 18 | Ht 73.0 in | Wt 240.0 lb

## 2011-11-22 DIAGNOSIS — I716 Thoracoabdominal aortic aneurysm, without rupture, unspecified: Secondary | ICD-10-CM

## 2011-11-22 DIAGNOSIS — Z48812 Encounter for surgical aftercare following surgery on the circulatory system: Secondary | ICD-10-CM

## 2011-11-22 DIAGNOSIS — I712 Thoracic aortic aneurysm, without rupture, unspecified: Secondary | ICD-10-CM

## 2011-11-22 MED ORDER — IOHEXOL 350 MG/ML SOLN
100.0000 mL | Freq: Once | INTRAVENOUS | Status: AC | PRN
  Administered 2011-11-22: 100 mL via INTRAVENOUS

## 2011-11-22 NOTE — Progress Notes (Signed)
The patient is back today for followup. 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 The patient has done very well at home and has no complaints.  On examination his right groin is well healed.  I have reviewed his CT scan which shows complete exclusion of his thoracic aneurysm. As expected the left renal artery has occluded. 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 Aneurysmal changes to the infradiaphragmatic aorta remain unchanged.  At this point I very pleased with his progress. He certainly has other findings on his CT scan which may need to be addressed in the future. Specifically, I want to pay close attention to his left hypogastric artery aneurysm. I will have a repeat CT scan in 6 months to further evaluate this. Also, at that time we will reevaluate his thoracic stent graft. He is at higher than normal risk to develop infectious issues. I will also have him see Dr. Tyrone Sage at that time.

## 2011-11-24 NOTE — Addendum Note (Signed)
Addended by: Sharee Pimple on: 11/24/2011 09:52 AM   Modules accepted: Orders

## 2011-12-27 ENCOUNTER — Other Ambulatory Visit: Payer: Self-pay | Admitting: *Deleted

## 2011-12-28 ENCOUNTER — Other Ambulatory Visit: Payer: Self-pay | Admitting: *Deleted

## 2011-12-28 DIAGNOSIS — I716 Thoracoabdominal aortic aneurysm, without rupture: Secondary | ICD-10-CM

## 2012-02-20 ENCOUNTER — Inpatient Hospital Stay (HOSPITAL_COMMUNITY)
Admission: EM | Admit: 2012-02-20 | Discharge: 2012-02-23 | DRG: 301 | Disposition: A | Discharge status: Other Acute Inpatient Hospital | Payer: Medicare Other | Attending: Vascular Surgery | Admitting: Vascular Surgery

## 2012-02-20 ENCOUNTER — Encounter (HOSPITAL_COMMUNITY): Payer: Self-pay | Admitting: Nurse Practitioner

## 2012-02-20 ENCOUNTER — Emergency Department (HOSPITAL_COMMUNITY): Payer: Medicare Other

## 2012-02-20 DIAGNOSIS — Z87891 Personal history of nicotine dependence: Secondary | ICD-10-CM

## 2012-02-20 DIAGNOSIS — I252 Old myocardial infarction: Secondary | ICD-10-CM

## 2012-02-20 DIAGNOSIS — E7800 Pure hypercholesterolemia, unspecified: Secondary | ICD-10-CM | POA: Diagnosis present

## 2012-02-20 DIAGNOSIS — I10 Essential (primary) hypertension: Secondary | ICD-10-CM | POA: Diagnosis present

## 2012-02-20 DIAGNOSIS — I711 Thoracic aortic aneurysm, ruptured: Secondary | ICD-10-CM

## 2012-02-20 DIAGNOSIS — Z79899 Other long term (current) drug therapy: Secondary | ICD-10-CM

## 2012-02-20 DIAGNOSIS — I7102 Dissection of abdominal aorta: Principal | ICD-10-CM | POA: Diagnosis present

## 2012-02-20 DIAGNOSIS — I2510 Atherosclerotic heart disease of native coronary artery without angina pectoris: Secondary | ICD-10-CM | POA: Diagnosis present

## 2012-02-20 DIAGNOSIS — Z7982 Long term (current) use of aspirin: Secondary | ICD-10-CM

## 2012-02-20 LAB — COMPREHENSIVE METABOLIC PANEL
Albumin: 2.8 g/dL — ABNORMAL LOW (ref 3.5–5.2)
Alkaline Phosphatase: 69 U/L (ref 39–117)
BUN: 13 mg/dL (ref 6–23)
Chloride: 95 mEq/L — ABNORMAL LOW (ref 96–112)
Glucose, Bld: 203 mg/dL — ABNORMAL HIGH (ref 70–99)
Potassium: 3.1 mEq/L — ABNORMAL LOW (ref 3.5–5.1)
Total Bilirubin: 0.6 mg/dL (ref 0.3–1.2)

## 2012-02-20 LAB — CBC
HCT: 31.4 % — ABNORMAL LOW (ref 39.0–52.0)
Hemoglobin: 9.9 g/dL — ABNORMAL LOW (ref 13.0–17.0)
RBC: 4.04 MIL/uL — ABNORMAL LOW (ref 4.22–5.81)
WBC: 11.1 10*3/uL — ABNORMAL HIGH (ref 4.0–10.5)

## 2012-02-20 LAB — LIPASE, BLOOD: Lipase: 29 U/L (ref 11–59)

## 2012-02-20 LAB — TYPE AND SCREEN: Antibody Screen: NEGATIVE

## 2012-02-20 LAB — URINALYSIS, ROUTINE W REFLEX MICROSCOPIC
Bilirubin Urine: NEGATIVE
Ketones, ur: NEGATIVE mg/dL
Nitrite: NEGATIVE
pH: 5.5 (ref 5.0–8.0)

## 2012-02-20 LAB — PROTIME-INR
INR: 1.25 (ref 0.00–1.49)
Prothrombin Time: 15.5 seconds — ABNORMAL HIGH (ref 11.6–15.2)

## 2012-02-20 LAB — POCT I-STAT, CHEM 8
BUN: 12 mg/dL (ref 6–23)
Chloride: 99 mEq/L (ref 96–112)
Creatinine, Ser: 1.5 mg/dL — ABNORMAL HIGH (ref 0.50–1.35)
Potassium: 3.2 mEq/L — ABNORMAL LOW (ref 3.5–5.1)
Sodium: 138 mEq/L (ref 135–145)
TCO2: 27 mmol/L (ref 0–100)

## 2012-02-20 MED ORDER — ENOXAPARIN SODIUM 30 MG/0.3ML ~~LOC~~ SOLN
40.0000 mg | Freq: Every day | SUBCUTANEOUS | Status: DC
  Administered 2012-02-20 – 2012-02-22 (×3): 40 mg via SUBCUTANEOUS
  Filled 2012-02-20 (×3): qty 0.4

## 2012-02-20 MED ORDER — GUAIFENESIN-DM 100-10 MG/5ML PO SYRP: 15.0000 mL | ORAL_SOLUTION | ORAL | Status: DC | PRN

## 2012-02-20 MED ORDER — POTASSIUM CHLORIDE CRYS ER 20 MEQ PO TBCR: 20.0000 meq | EXTENDED_RELEASE_TABLET | Freq: Once | ORAL | Status: DC

## 2012-02-20 MED ORDER — METOPROLOL TARTRATE 1 MG/ML IV SOLN: 2.0000 mg | INTRAVENOUS | Status: DC | PRN

## 2012-02-20 MED ORDER — ACETAMINOPHEN 325 MG RE SUPP
325.0000 mg | RECTAL | Status: DC | PRN
  Filled 2012-02-20: qty 2

## 2012-02-20 MED ORDER — IOHEXOL 300 MG/ML  SOLN
50.0000 mL | Freq: Once | INTRAMUSCULAR | Status: AC | PRN
  Administered 2012-02-20: 50 mL via ORAL

## 2012-02-20 MED ORDER — LABETALOL HCL 5 MG/ML IV SOLN: 10.0000 mg | INTRAVENOUS | Status: DC | PRN

## 2012-02-20 MED ORDER — ALUM & MAG HYDROXIDE-SIMETH 200-200-20 MG/5ML PO SUSP: 15.0000 mL | ORAL | Status: DC | PRN

## 2012-02-20 MED ORDER — ESMOLOL HCL-SODIUM CHLORIDE 2000 MG/100ML IV SOLN
25.0000 ug/kg/min | INTRAVENOUS | Status: DC
  Administered 2012-02-20: 70 ug/kg/min via INTRAVENOUS
  Administered 2012-02-20 (×2): 75 ug/kg/min via INTRAVENOUS
  Administered 2012-02-21 (×5): 85 ug/kg/min via INTRAVENOUS
  Administered 2012-02-21: 75 ug/kg/min via INTRAVENOUS
  Administered 2012-02-21: 85 ug/kg/min via INTRAVENOUS
  Administered 2012-02-22: 75 ug/kg/min via INTRAVENOUS
  Administered 2012-02-22 (×2): 85 ug/kg/min via INTRAVENOUS
  Administered 2012-02-22: 75 ug/kg/min via INTRAVENOUS
  Administered 2012-02-22: 85 ug/kg/min via INTRAVENOUS
  Administered 2012-02-22 – 2012-02-23 (×2): 75 ug/kg/min via INTRAVENOUS
  Filled 2012-02-20 (×22): qty 100

## 2012-02-20 MED ORDER — OXYCODONE-ACETAMINOPHEN 5-325 MG PO TABS
1.0000 | ORAL_TABLET | ORAL | Status: DC | PRN
Start: 2012-02-20 — End: 2012-02-23
  Administered 2012-02-20 – 2012-02-23 (×4): 2 via ORAL
  Filled 2012-02-20 (×4): qty 2

## 2012-02-20 MED ORDER — ESMOLOL HCL-SODIUM CHLORIDE 2000 MG/100ML IV SOLN
25.0000 ug/kg/min | Freq: Once | INTRAVENOUS | Status: AC
  Administered 2012-02-20: 25 ug/kg/min via INTRAVENOUS
  Filled 2012-02-20: qty 100

## 2012-02-20 MED ORDER — POTASSIUM CHLORIDE 10 MEQ/100ML IV SOLN: 10.0000 meq | Freq: Once | INTRAVENOUS | Status: DC

## 2012-02-20 MED ORDER — HYDROMORPHONE HCL PF 1 MG/ML IJ SOLN
1.0000 mg | INTRAMUSCULAR | Status: DC | PRN
  Administered 2012-02-20 – 2012-02-23 (×17): 1 mg via INTRAVENOUS
  Filled 2012-02-20 (×18): qty 1

## 2012-02-20 MED ORDER — PANTOPRAZOLE SODIUM 40 MG PO TBEC
40.0000 mg | DELAYED_RELEASE_TABLET | Freq: Every day | ORAL | Status: DC
  Administered 2012-02-20 – 2012-02-22 (×3): 40 mg via ORAL
  Filled 2012-02-20 (×3): qty 1

## 2012-02-20 MED ORDER — HYDRALAZINE HCL 20 MG/ML IJ SOLN
10.0000 mg | INTRAMUSCULAR | Status: DC | PRN
  Filled 2012-02-20: qty 0.5

## 2012-02-20 MED ORDER — PHENOL 1.4 % MT LIQD: 1.0000 | OROMUCOSAL | Status: DC | PRN

## 2012-02-20 MED ORDER — ONDANSETRON HCL 4 MG/2ML IJ SOLN
4.0000 mg | Freq: Once | INTRAMUSCULAR | Status: AC
  Administered 2012-02-20: 4 mg via INTRAVENOUS
  Filled 2012-02-20: qty 2

## 2012-02-20 MED ORDER — POTASSIUM CHLORIDE IN NACL 20-0.9 MEQ/L-% IV SOLN
INTRAVENOUS | Status: DC
  Administered 2012-02-20: 50 mL/h via INTRAVENOUS
  Administered 2012-02-21: 09:00:00 via INTRAVENOUS
  Administered 2012-02-22: 50 mL via INTRAVENOUS
  Administered 2012-02-22: via INTRAVENOUS
  Filled 2012-02-20 (×4): qty 1000

## 2012-02-20 MED ORDER — HYDROMORPHONE HCL PF 1 MG/ML IJ SOLN
1.0000 mg | Freq: Once | INTRAMUSCULAR | Status: AC
  Administered 2012-02-20: 1 mg via INTRAVENOUS
  Filled 2012-02-20: qty 1

## 2012-02-20 MED ORDER — ACETAMINOPHEN 325 MG PO TABS: 325.0000 mg | ORAL_TABLET | ORAL | Status: DC | PRN

## 2012-02-20 MED ORDER — IOHEXOL 300 MG/ML  SOLN
100.0000 mL | Freq: Once | INTRAMUSCULAR | Status: AC | PRN
  Administered 2012-02-20: 100 mL via INTRAVENOUS

## 2012-02-20 MED ORDER — ONDANSETRON HCL 4 MG/2ML IJ SOLN: 4.0000 mg | Freq: Four times a day (QID) | INTRAMUSCULAR | Status: DC | PRN

## 2012-02-20 MED ORDER — POTASSIUM CHLORIDE CRYS ER 20 MEQ PO TBCR
20.0000 meq | EXTENDED_RELEASE_TABLET | Freq: Once | ORAL | Status: AC
  Administered 2012-02-20: 20 meq via ORAL
  Filled 2012-02-20: qty 1

## 2012-02-20 NOTE — Progress Notes (Signed)
VASCULAR PROGRESS NOTE  SUBJECTIVE: No abdominal pain.  PHYSICAL EXAM: Filed Vitals:   02/20/12 0830 02/20/12 0845 02/20/12 0900 02/20/12 0915  BP: 127/82 111/97 131/83   Pulse: 70 70 70 71  Temp:      TempSrc:      Resp: 16 17 18 12   Height:      Weight:      SpO2: 91% 91% 92% 95%   Abdomen soft and non tender.  LABS: Lab Results  Component Value Date   WBC 11.1* 02/20/2012   HGB 11.6* 02/20/2012   HCT 34.0* 02/20/2012   MCV 77.7* 02/20/2012   PLT 209 02/20/2012   Lab Results  Component Value Date   CREATININE 1.50* 02/20/2012   Lab Results  Component Value Date   INR 1.25 02/20/2012   ASSESSMENT AND PLAN: 1. No pain.  2.Keep BP well controlled. Cont Esmolol.  3. Follow exam  Cari Caraway Beeper: [phone] 02/20/2012

## 2012-02-20 NOTE — H&P (Signed)
Vascular and Vein Specialist of [facility]  Patient name: Jesse Le MRN: [id number] DOB: 1945-06-11 Sex: male  REASON FOR ADMISSION: Abdominal Pain with Aortic dissection/ aneurysm  HPI: Jayen Bromwell is a 67 y.o. 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 This was for a 12 cm thoracic aortic aneurysm. He was not a candidate for open repair. He had been doing well and was last seen by Dr. Myra Gianotti on 11/22/2011. 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 He also had a known left hypogastric artery aneurysm.  2 days ago he noted some day generalized abdominal pain and nausea. This came on fairly suddenly and he thought he developed a viral illness. This eased up and then resolved.  However, yesterday he developed worsening abdominal pain and nausea. The pain was generalized. He denied back pain. The pain has subsequently completely resolved. However, he came to the emergency department and CT scan suggested dissection in the infrarenal aorta. Vascular surgery was consulted.  He has no abdominal pain currently. He has no leg pain or paresthesias. He has no nausea currently.  Past Medical History  Diagnosis Date  . Coronary artery disease   . Hypertension   . Myocardial infarct    He denies any history of diabetes. He is on medication for blood pressure and cholesterol. He has had a previous myocardial infarction in the past. His cardiologist is Dr. Sharyn Lull.   No family history on file.  He is unaware of any history of aneurysmal disease in the family. He is unaware of any history of premature cardiovascular disease.  SOCIAL HISTORY: History  Substance Use Topics  . Smoking status: Former Smoker -- 0.50 packs/day    Types: Cigarettes    Quit date: 07/20/2011  . Smokeless tobacco: Not on file  . Alcohol Use: 0.6  oz/week    1 Shots of liquor per week     Comment: weekends   He is single. He does not have any children. He quit tobacco approximately 6 months ago. He states that he was not a heavy smoker.   No Known Allergies  Current Facility-Administered Medications  Medication Dose Route Frequency Provider Last Rate Last Dose  . HYDROmorphone (DILAUDID) injection 1 mg  1 mg Intravenous Q2H PRN Sunnie Nielsen, MD   1 mg at 02/20/12 0208   Current Outpatient Prescriptions  Medication Sig Dispense Refill  . amLODipine (NORVASC) 5 MG tablet Take 1 tablet (5 mg total) by mouth daily.  30 tablet  3  . aspirin EC 81 MG tablet Take 81 mg by mouth daily.      . metoprolol succinate (TOPROL-XL) 100 MG 24 hr tablet Take 1 tablet (100 mg total) by mouth daily. Take with or immediately following a meal.  30 tablet  3  . nitroGLYCERIN (NITRODUR - DOSED IN MG/24 HR) 0.4 mg/hr Place 30 patches (12 mg total) onto the skin daily.  30 patch  3  . pantoprazole (PROTONIX) 40 MG tablet Take 1 tablet (40 mg total) by mouth daily at 12 noon.  30 tablet  3  . simvastatin (ZOCOR) 20 MG tablet Take 20 mg by mouth every evening.        REVIEW OF SYSTEMS: Arly.Keller ] denotes positive finding; [  ] denotes negative finding CARDIOVASCULAR:  [ ]  chest pain   [ ]  chest pressure   [ ]   palpitations   [ ]  orthopnea   Arly.Keller ] dyspnea on exertion   [ ]  claudication   [ ]  rest pain   [ ]  DVT   [ ]  phlebitis PULMONARY:   [ ]  productive cough   [ ]  asthma   [ ]  wheezing NEUROLOGIC:   [ ]  weakness  [ ]  paresthesias  [ ]  aphasia  [ ]  amaurosis  [ ]  dizziness HEMATOLOGIC:   [ ]  bleeding problems   [ ]  clotting disorders MUSCULOSKELETAL:  [ ]  joint pain   [ ]  joint swelling [ ]  leg swelling GASTROINTESTINAL: [ ]   blood in stool  [ ]   hematemesis GENITOURINARY:  [ ]   dysuria  [ ]   hematuria PSYCHIATRIC:  [ ]  history of major depression INTEGUMENTARY:  [ ]  rashes  [ ]  ulcers CONSTITUTIONAL:  [ ]  fever   [ ]  chills  PHYSICAL EXAM: Filed Vitals:    02/20/12 0030 02/20/12 0139 02/20/12 0215 02/20/12 0342  BP: 161/113 126/84 131/87 151/101  Pulse:   79 82  Temp:    98.4 F (36.9 C)  TempSrc:    Oral  Resp: 17 18 15 20   SpO2: 97% 94% 98% 94%   There is no weight on file to calculate BMI. GENERAL: The patient is a well-nourished male, in no acute distress. The vital signs are documented above. CARDIOVASCULAR: There is a regular rate and rhythm. I do not detect carotid bruits. He has palpable femoral, dorsalis pedis, and posterior tibial pulses bilaterally. PULMONARY: There is good air exchange bilaterally without wheezing or rales. ABDOMEN: Soft and non-tender with normal pitched bowel sounds.  He is obese and therefore I am unable to palpate his abdominal aneurysm. MUSCULOSKELETAL: There are no major deformities or cyanosis. NEUROLOGIC: No focal weakness or paresthesias are detected. SKIN: There are no ulcers or rashes noted. PSYCHIATRIC: The patient has a normal affect.  DATA:  Lab Results  Component Value Date   WBC 11.1* 02/20/2012   HGB 11.6* 02/20/2012   HCT 34.0* 02/20/2012   MCV 77.7* 02/20/2012   PLT 209 02/20/2012   Lab Results  Component Value Date   NA 138 02/20/2012   K 3.2* 02/20/2012   CL 99 02/20/2012   CO2 27 02/20/2012   Lab Results  Component Value Date   CREATININE 1.50* 02/20/2012   Lab Results  Component Value Date   INR 1.25 02/20/2012   INR 1.42 10/14/2011   INR 1.27 10/13/2011   CT Angio of Chest/Abdomen (11/22/11): Post endovascular repair of previously identified massive thoracic  aortic aneurysm and dissection. There is no definitive evidence of endo leak on this examination, however note, the caudal aspect of the stent graft terminates within the aneurysmal component of the descending thoracic aorta at the level of the diaphragmatic hiatus. No change in the external caliber of the native thoracic aorta,  measuring approximately 11.8 cm in greatest oblique axial dimension.  CT OF THE ABDOMEN (tonight): compared  to the previous scan, the size of the abdominal aneurysm and iliac aneurysm has remained essentially the same. However, on tonight study rather than one flow lumen there are 2 flow lumens suggesting dissection with flow now demonstrated within a previously thrombosed portion of the aneurysm to the level of the bifurcation.  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 He has a known aneurysmal juxtarenal and infrarenal aorta below the stent graft. He presented  with abdominal pain which is now resolved. He has no evidence of leak of his abdominal aneurysm. He does have evidence of dissection within the aortic aneurysm. There is no compromise of the iliacs and he has excellent pulses. I will admit the patient to the intensive care unit for careful blood pressure control and observation. I will discuss the case with Dr. Myra Gianotti. The thoracic stents and is just above the level of the superior mesenteric artery. He has a juxtarenal and infrarenal aneurysm. As noted before he is not a candidate for open thoracoabdominal abdominal aneurysm repair. If the juxtarenal or abdominal aneurysm enlarged further or became symptomatic, consideration would have to be given to a complex fenestrated repair if this were even possible. This is obviously a complicated situation, however, currently he is stable, asymptomatic and I do not see any significant change in the size of his infrarenal or juxtarenal aneurysm. He is simply developed an additional flow lumen within the laminated clot within his infrarenal aneurysm.  Kacy Hegna S [facility] Vein Specialists of [place] Beeper: [phone]

## 2012-02-20 NOTE — ED Notes (Signed)
Pt sleeping waiting on a room assignment

## 2012-02-20 NOTE — ED Notes (Signed)
Pt 02sats 88-91/RA, respirations 16bpm- pt placed on 2L/[place] o2sats 96%

## 2012-02-20 NOTE — ED Notes (Signed)
Per PT[facility] pt c/o right cramping abd pain x1day n/v yesterday nausea persists. Family sts has fever & chills BP 180/90 HR 90 R 22 O2sats 98%/RA

## 2012-02-20 NOTE — ED Notes (Signed)
Pt resting comfortably with wife at the bedside.

## 2012-02-20 NOTE — ED Provider Notes (Signed)
History     CSN: [id number]  Arrival date & time 02/20/12  0001   First MD Initiated Contact with Patient 02/20/12 [phone]      Chief Complaint  Patient presents with  . Abdominal Pain    (Consider location/radiation/quality/duration/timing/severity/associated sxs/prior treatment) HPI Hx per PT. Lower ABD pain since last night, severe, feel like he has to have a BM but unable to .  Has associated N/V, pain is bilateral. Has felt warm but no fevers. No diarrhea.  No radiation of pain. No blood in emesis or stools, has h/o thoracic aneurysm repair. No CP or SOB. Family reports elevated BP at home. No known alleviating factors Past Medical History  Diagnosis Date  . Coronary artery disease   . Hypertension   . Myocardial infarct     Past Surgical History  Procedure Laterality Date  . Video bronchoscopy  10/14/2011    Procedure: VIDEO BRONCHOSCOPY;  Surgeon: Nada Libman, MD;  Location: [facility] OR;  Service: Vascular;  Laterality: N/A;  . Intravascular ultrasound  10/14/2011    Procedure: INTRAVASCULAR ULTRASOUND;  Surgeon: Nada Libman, MD;  Location: [facility] OR;  Service: Vascular;  Laterality: N/A;  . Abdominal aortic aneurysm repair  10-14-11    TEVAR    No family history on file.  History  Substance Use Topics  . Smoking status: Former Smoker -- 0.50 packs/day    Types: Cigarettes    Quit date: 07/20/2011  . Smokeless tobacco: Not on file  . Alcohol Use: 0.6 oz/week    1 Shots of liquor per week     Comment: weekends      Review of Systems  Constitutional: Negative for fever and chills.  HENT: Negative for neck pain and neck stiffness.   Eyes: Negative for visual disturbance.  Respiratory: Negative for shortness of breath.   Cardiovascular: Negative for chest pain.  Gastrointestinal: Positive for abdominal pain. Negative for blood in stool.  Genitourinary: Negative for dysuria.  Musculoskeletal: Negative for back pain.  Skin: Negative for rash.  Neurological: Negative  for weakness, numbness and headaches.  All other systems reviewed and are negative.    Allergies  Review of patient's allergies indicates no known allergies.  Home Medications   Current Outpatient Rx  Name  Route  Sig  Dispense  Refill  . amLODipine (NORVASC) 5 MG tablet   Oral   Take 1 tablet (5 mg total) by mouth daily.   30 tablet   3   . aspirin EC 81 MG tablet   Oral   Take 81 mg by mouth daily.         . metoprolol succinate (TOPROL-XL) 100 MG 24 hr tablet   Oral   Take 1 tablet (100 mg total) by mouth daily. Take with or immediately following a meal.   30 tablet   3   . nitroGLYCERIN (NITRODUR - DOSED IN MG/24 HR) 0.4 mg/hr   Transdermal   Place 30 patches (12 mg total) onto the skin daily.   30 patch   3   . pantoprazole (PROTONIX) 40 MG tablet   Oral   Take 1 tablet (40 mg total) by mouth daily at 12 noon.   30 tablet   3   . simvastatin (ZOCOR) 20 MG tablet   Oral   Take 20 mg by mouth every evening.           BP 161/113  Temp(Src) 97.7 F (36.5 C) (Oral)  Resp 17  SpO2 97%  Physical Exam  Constitutional: He is oriented to person, place, and time. He appears well-developed and well-nourished.  HENT:  Head: Normocephalic and atraumatic.  Eyes: EOM are normal. Pupils are equal, round, and reactive to light.  Neck: Neck supple.  Cardiovascular: Normal rate, regular rhythm and intact distal pulses.   Pulmonary/Chest: Effort normal and breath sounds normal. No respiratory distress.  Abdominal: Soft. He exhibits no distension. There is no rebound and no guarding.  Lower ABD tenderness RLQ/ LLQ, umbilical hernia present  Musculoskeletal: Normal range of motion. He exhibits no edema.  Neurological: He is alert and oriented to person, place, and time.  Skin: Skin is warm and dry.    ED Course  Procedures (including critical care time)  Results for orders placed during the hospital encounter of 02/20/12  CBC      Result Value Range   WBC  11.1 (*) 4.0 - 10.5 K/uL   RBC 4.04 (*) 4.22 - 5.81 MIL/uL   Hemoglobin 9.9 (*) 13.0 - 17.0 g/dL   HCT 16.1 (*) 09.6 - 04.5 %   MCV 77.7 (*) 78.0 - 100.0 fL   MCH 24.5 (*) 26.0 - 34.0 pg   MCHC 31.5  30.0 - 36.0 g/dL   RDW 40.9 (*) 81.1 - 91.4 %   Platelets 209  150 - 400 K/uL  COMPREHENSIVE METABOLIC PANEL      Result Value Range   Sodium 133 (*) 135 - 145 mEq/L   Potassium 3.1 (*) 3.5 - 5.1 mEq/L   Chloride 95 (*) 96 - 112 mEq/L   CO2 27  19 - 32 mEq/L   Glucose, Bld 203 (*) 70 - 99 mg/dL   BUN 13  6 - 23 mg/dL   Creatinine, Ser 7.82 (*) 0.50 - 1.35 mg/dL   Calcium 9.1  8.4 - 95.6 mg/dL   Total Protein 8.8 (*) 6.0 - 8.3 g/dL   Albumin 2.8 (*) 3.5 - 5.2 g/dL   AST 16  0 - 37 U/L   ALT 9  0 - 53 U/L   Alkaline Phosphatase 69  39 - 117 U/L   Total Bilirubin 0.6  0.3 - 1.2 mg/dL   GFR calc non Af Amer 52 (*) >90 mL/min   GFR calc Af Amer 60 (*) >90 mL/min  LIPASE, BLOOD      Result Value Range   Lipase 29  11 - 59 U/L  POCT I-STAT TROPONIN I      Result Value Range   Troponin i, poc 0.00  0.00 - 0.08 ng/mL   Comment 3           POCT I-STAT, CHEM 8      Result Value Range   Sodium 138  135 - 145 mEq/L   Potassium 3.2 (*) 3.5 - 5.1 mEq/L   Chloride 99  96 - 112 mEq/L   BUN 12  6 - 23 mg/dL   Creatinine, Ser 2.13 (*) 0.50 - 1.35 mg/dL   Glucose, Bld 086 (*) 70 - 99 mg/dL   Calcium, Ion 5.78  4.69 - 1.30 mmol/L   TCO2 27  0 - 100 mmol/L   Hemoglobin 11.6 (*) 13.0 - 17.0 g/dL   HCT 62.9 (*) 52.8 - 41.3 %   Ct Abdomen Pelvis W Contrast  02/20/2012  *RADIOLOGY REPORT*  Clinical Data: Abdominal pain in the lower abdomen with nausea and vomiting.  CT ABDOMEN AND PELVIS WITH CONTRAST  Technique:  Multidetector CT imaging of the abdomen and pelvis was  performed following the standard protocol during bolus administration of intravenous contrast.  Contrast: OMNIPAQUE IOHEXOL 300 MG/ML  SOLN  Comparison: 11/22/2011  Findings: Atelectasis in the left lung base with consolidation  in the right lung base suggesting pneumonia.  And aortic stent graft is present in the descending thoracic aorta and extending to the thoracic hiatus.  The upper portion of the stent is not included within the study.  This is stenting a large descending thoracic aortic aneurysm.  The stent appears patent and there is no evidence of any significant flow within the thrombosed native sac in the chest.  Since the previous study, there is an abdominal aortic aneurysm extending into the left common iliac and internal iliac arteries. Maximal diameter of the aneurysm is at the level of the superior mesenteric artery origin and measuring about 4.7 x 5.4 cm. Infrarenal abdominal aorta measures about 4 cm diameter.  Left common iliac artery aneurysm measures 2.9 cm diameter.  Left internal iliac artery aneurysm measures 4.4 cm diameter.  There is calcification and mural thrombus present.  Since the previous study, the size of the aneurysm has remained basically stable. However, there is interval development of dissection with flow demonstrated within the previously thrombosed portion of the aneurysm to the level of the bifurcation.  There is some hazy opacity around the distal abdominal aorta extending to the left iliac artery region. Changes may represent inflammatory process or evidence of impending rupture.  There is no abnormal retroperitoneal fluid collection.  The internal iliac artery aneurysm contains calcification in the thrombus which remains stable.  Cholelithiasis.  Diffuse fatty infiltration of the liver. Pancreas, spleen, adrenal glands, and retroperitoneal lymph nodes are unremarkable. The stomach is not abnormally distended.  The small bowel and colon are decompressed.  Prominent visceral adipose tissues.  No free fluid or free air in the abdomen.  There is an infraumbilical abdominal aortic aneurysm containing fat which looks stable.  Pelvis:  Prostate gland is not enlarged.  Bladder wall is not thickened.  No  free or loculated pelvic fluid collections. Appendix is normal.  Diverticula in the sigmoid colon without diverticulitis.  Degenerative changes in the spine with normal alignment.  IMPRESSION: Previously stented aneurysm of the descending thoracic aorta. Aneurysm extends into the abdominal aorta, left iliac artery, and left internal iliac artery as previously.  However, there is interval development of abdominal aortic dissection.  Infiltration in the fat around the aorta and left common iliac artery could suggest inflammation or changes of impending rupture.  Results were discussed by telephone with Dr. Dierdre Highman at 0259 hours on 02/20/2012.   Original Report Authenticated By: Burman Nieves, M.D.    CRITICAL CARE Performed by: Sunnie Nielsen   Total critical care time: 30  Critical care time was exclusive of separately billable procedures and treating other patients.  Critical care was necessary to treat or prevent imminent or life-threatening deterioration.  Critical care was time spent personally by me on the following activities: development of treatment plan with patient and/or surrogate as well as nursing, discussions with consultants, evaluation of patient's response to treatment, examination of patient, obtaining history from patient or surrogate, ordering and performing treatments and interventions, ordering and review of laboratory studies, ordering and review of radiographic studies, pulse oximetry and re-evaluation of patient's condition. Esmolol BB drip BP control. VAS admit.    Date: 02/20/2012  Rate: 71  Rhythm: normal sinus rhythm  QRS Axis: normal  Intervals: QT prolonged  ST/T Wave abnormalities: nonspecific ST changes  Conduction Disutrbances:none  Narrative Interpretation:   Old EKG Reviewed: none available  IVFs, NPO, IV dilaudid, potassium for hypokalemia  3:08 AM d/w RAD, VAS consulted and Dr Edilia Bo notified and on his way to evaluate PT.   MDM  ABD pain with  dissecting AAA by Ct scan reviewed as above.   Pain control IV narcotics  BP control IV Esmolol  Labs and ECG reviewed as above.  Serial evaluations, VAS consult and admit         Sunnie Nielsen, MD 02/20/12 [phone]

## 2012-02-21 LAB — URINE CULTURE
Colony Count: NO GROWTH
Culture: NO GROWTH

## 2012-02-21 NOTE — Plan of Care (Signed)
Problem: Phase I Progression Outcomes Goal: OOB as tolerated unless otherwise ordered Outcome: Not Progressing Bedrest with bathroom privileges

## 2012-02-21 NOTE — Progress Notes (Signed)
UR Completed.  Jesse Le Jane [phone] 02/21/2012  

## 2012-02-21 NOTE — Progress Notes (Addendum)
Vascular and Vein Specialists Progress Note  02/21/2012 7:47 AM  HPI:  Jesse Le is a 67 y.o. 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 This was for a 12 cm thoracic aortic aneurysm. He was not a candidate for open repair. He had been doing well and was last seen by Dr. Myra Gianotti on 11/22/2011. 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 He also had a known left hypogastric artery aneurysm.  2 days ago he noted some day generalized abdominal pain and nausea. This came on fairly suddenly and he thought he developed a viral illness. This eased up and then resolved. However, yesterday he developed worsening abdominal pain and nausea. The pain was generalized. He denied back pain. The pain has subsequently completely resolved. However, he came to the emergency department and CT scan suggested dissection in the infrarenal aorta. Vascular surgery was consulted.  He has no abdominal pain currently. He has no leg pain or paresthesias. He has no nausea currently.     Filed Vitals:   02/21/12 0730  BP: 151/82  Pulse: 63  Temp:   Resp: 14     Physical Exam: Cardiac:  PVC Lungs:  CTA, non labor breathing Abdomen:  Non tender Extremities:  Pulses palpable DP/PT bilateral  CBC    Component Value Date/Time   WBC 11.1* 02/20/2012 0051   RBC 4.04* 02/20/2012 0051   HGB 11.6* 02/20/2012 0109   HCT 34.0* 02/20/2012 0109   PLT 209 02/20/2012 0051   MCV 77.7* 02/20/2012 0051   MCH 24.5* 02/20/2012 0051   MCHC 31.5 02/20/2012 0051   RDW 16.2* 02/20/2012 0051   LYMPHSABS 1.5 10/09/2011 1013   MONOABS 0.5 10/09/2011 1013   EOSABS 0.1 10/09/2011 1013   BASOSABS 0.0 10/09/2011 1013    BMET    Component Value Date/Time   NA 138 02/20/2012 0109   K 3.2* 02/20/2012 0109   CL 99 02/20/2012 0109   CO2 27 02/20/2012 0051   GLUCOSE 203* 02/20/2012  0109   BUN 12 02/20/2012 0109   CREATININE 1.50* 02/20/2012 0109   CALCIUM 9.1 02/20/2012 0051   GFRNONAA 52* 02/20/2012 0051   GFRAA 60* 02/20/2012 0051    INR    Component Value Date/Time   INR 1.25 02/20/2012 0320     Intake/Output Summary (Last 24 hours) at 02/21/12 0747 Last data filed at 02/21/12 0700  Gross per 24 hour  Intake 2371.95 ml  Output    690 ml  Net 1681.95 ml     Assessment/Plan:  67 y.o. male THORACOABDOMINAL ANEURYSM His has developed a dissecting abdominal aortic aneurysm secondary to dissection with a stent graft.  Dr. Myra Gianotti will call and discuss his case with [place] physicians and possible transfer today. Continue to monitor BP.    Thomasena Edis EMMA [place] , [place] [facility] [phone] 02/21/2012 7:47 AM  I agree with the above. The patient is having intermittent abdominal pain. I have reviewed his CT scan which is markedly different within the infrarenal aorta, when compared to November. There is a new flow channel present. There has not been significant change in the diameter of his aorta. I suspect that he is becoming symptomatic from his new dissection within the infrarenal maximum aortic diameters are in the 5-1/2 cm range. 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 At this  point I am most likely going to recommend transferred to the [facility] for complex endovascular repair.  Durene Cal

## 2012-02-21 NOTE — Progress Notes (Signed)
Inpatient Diabetes Program Recommendations  AACE/ADA: New Consensus Statement on Inpatient Glycemic Control (2013)  Target Ranges:  Prepandial:   less than 140 mg/dL      Peak postprandial:   less than 180 mg/dL (1-2 hours)      Critically ill patients:  140 - 180 mg/dL   Results for HABEEB, PUERTAS (MRN [id number]) as of 02/21/2012 13:55  Ref. Range 02/20/2012 00:51 02/20/2012 01:09  Glucose Latest Range: 70-99 mg/dL 409 (H) 811 (H)    Inpatient Diabetes Program Recommendations Correction (SSI): Please consider ordering CBGs ACHS with Novolog correction if necessary. HgbA1C: Please consider ordering an A1C to determine glycemic control over the past 2-3 months.  Note: Patient does not have a documented history of diabetes.  Blood glucose was noted to be 203 mg/dl on 2-9 @ 91:47 and 829 mg/dl again at 5:62.  Please consider ordering CBGs ACHS with Novolog correction if necessary and order an A1C to determine glycemic control over the past 2-3 months.  Will continue to follow.  Thanks, Orlando Penner, RN, BSN, CCRN Diabetes Coordinator Inpatient Diabetes Program [phone]

## 2012-02-21 NOTE — Clinical Documentation Improvement (Signed)
GENERIC DOCUMENTATION CLARIFICATION QUERY  THIS DOCUMENT IS NOT A PERMANENT PART OF THE MEDICAL RECORD  TO RESPOND TO THE THIS QUERY, FOLLOW THE INSTRUCTIONS BELOW:  1. If needed, update documentation for the patient's encounter via the notes activity.  2. Access this query again and click edit on the In [employer].  3. After updating, or not, click F2 to complete all highlighted (required) fields concerning your review. Select "additional documentation in the medical record" OR "no additional documentation provided".  4. Click Sign note button.  5. The deficiency will fall out of your In Basket *Please let us know if you are not able to complete this workflow by phone or e-mail (listed below).  Please update your documentation within the medical record to reflect your response to this query.                                                                                        02/21/12   Dear Dr. Edilia Bo / Associates,  In a better effort to capture your patient's severity of illness, reflect appropriate length of stay and utilization of resources, a review of the patient medical record has revealed the following indicators.    Based on your clinical judgment, please clarify and document in a progress note and/or discharge summary the clinical condition associated with the following supporting information:  In responding to this query please exercise your independent judgment.  The fact that a query is asked, does not imply that any particular answer is desired or expected.   Possible Clinical Conditions?  _______Urinary Tract Infection   _______Other Condition  _______Cannot Clinically Determine     Diagnostics:02/20/12:  Routine urinalysis: Appearance: turbid;   Hgb urine dipstick:  trace;  Protein:  100; Leukocytes:  Small.  Urine, micro: Squamous epithelial: many; Bacteria:  Few; Casts: hyaline casts; Crystals: Ca. Oxalate crystals.    You may use possible,  probable, or suspect with inpatient documentation. possible, probable, suspected diagnoses MUST be documented at the time of discharge  Reviewed: No additional documentation provided.                                                          Thank You,  Marciano Sequin,  Clinical Documentation Specialist:  Pager: [phone]  Phone: [phone]  Health Information Management [place]

## 2012-02-22 MED ORDER — AMLODIPINE BESYLATE 5 MG PO TABS
5.0000 mg | ORAL_TABLET | Freq: Every day | ORAL | Status: DC
  Administered 2012-02-22: 5 mg via ORAL
  Filled 2012-02-22 (×2): qty 1

## 2012-02-22 MED ORDER — ASPIRIN EC 81 MG PO TBEC
81.0000 mg | DELAYED_RELEASE_TABLET | Freq: Every day | ORAL | Status: DC
  Administered 2012-02-22: 81 mg via ORAL
  Filled 2012-02-22 (×2): qty 1

## 2012-02-22 NOTE — Progress Notes (Addendum)
[facility] [place]    History of Present Illness Jesse Le is a 67 y.o. 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 This was for a 12 cm thoracic aortic aneurysm. He was not a candidate for open repair. Pt denies chest or abdominal pain.   Significant Diagnostic Studies: CBC    Component Value Date/Time   WBC 11.1* 02/20/2012 0051   RBC 4.04* 02/20/2012 0051   HGB 11.6* 02/20/2012 0109   HCT 34.0* 02/20/2012 0109   PLT 209 02/20/2012 0051   MCV 77.7* 02/20/2012 0051   MCH 24.5* 02/20/2012 0051   MCHC 31.5 02/20/2012 0051   RDW 16.2* 02/20/2012 0051   LYMPHSABS 1.5 10/09/2011 1013   MONOABS 0.5 10/09/2011 1013   EOSABS 0.1 10/09/2011 1013   BASOSABS 0.0 10/09/2011 1013    BMET    Component Value Date/Time   NA 138 02/20/2012 0109   K 3.2* 02/20/2012 0109   CL 99 02/20/2012 0109   CO2 27 02/20/2012 0051   GLUCOSE 203* 02/20/2012 0109   BUN 12 02/20/2012 0109   CREATININE 1.50* 02/20/2012 0109   CALCIUM 9.1 02/20/2012 0051   GFRNONAA 52* 02/20/2012 0051   GFRAA 60* 02/20/2012 0051    COAG Lab Results  Component Value Date   INR 1.25 02/20/2012   INR 1.42 10/14/2011   INR 1.27 10/13/2011   No results found for this basename: PTT    Physical Examination  BP Readings from Last 3 Encounters:  02/22/12 135/92  11/22/11 169/90  10/21/11 131/91   Temp Readings from Last 3 Encounters:  02/22/12 98.3 F (36.8 C) Oral  11/22/11 97.6 F (36.4 C) Oral  10/21/11 98.1 F (36.7 C) Oral   Ht Readings from Last 3 Encounters:  02/20/12 6' 0.83" (1.85 m)  11/22/11 6\' 1"  (1.854 m)  10/10/11 6\' 2"  (1.88 m)     Pt is A&O x 3 Abdomen soft, NT Right Posterior tibial   pulse is palpable Left Posterior tibial pulse is  palpable  Assessment: Pt. Doing well Pain is controlled Plan: transfer to [facility] for complex endovascular repair     Marlowe Shores 02/22/2012 9:59  AM   Agree with above, I have seen and examined the patient. He continues to have intermittant abdominal pain Palpable pedal pulses will plan transfer to [facility] for complex endovascular repair  Durene Cal

## 2012-02-22 NOTE — Discharge Summary (Signed)
[facility] Discharge Summary   Patient ID:  Jesse Le MRN: [id number] DOB/AGE: [birth year] 67 y.o.  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 Coronary artery disease   . Hypertension   . Myocardial infarct     Discharged Condition: good  HPI:  Jesse Le is a 67 y.o. 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 This was for a 12 cm thoracic aortic aneurysm. He was not a candidate for open repair. He had been doing well and was last seen by Dr. Myra Gianotti on 11/22/2011. 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 He also had a known left hypogastric artery aneurysm.  2 days ago he noted some day generalized abdominal pain and nausea. This came on fairly suddenly and he thought he developed a viral illness. This eased up and then resolved. However, yesterday he developed worsening abdominal pain and nausea. The pain was generalized. He denied back pain. The pain has subsequently completely resolved. However, he came to the emergency department and CT scan suggested dissection in the infrarenal aorta. Vascular surgery was consulted.  He has no abdominal pain currently. He has no leg pain or paresthesias. He has no nausea currently.    Hospital Course:  CT Angio of Chest/Abdomen (11/22/11): Post endovascular repair of previously identified massive thoracic  aortic aneurysm and dissection. There is no definitive evidence of endo leak on this examination, however note, the caudal aspect of the stent graft terminates within the aneurysmal component of the descending  thoracic aorta at the level of the diaphragmatic hiatus. No change in the external caliber of the native thoracic aorta,  measuring approximately 11.8 cm in greatest oblique axial dimension.  CT OF THE ABDOMEN (02/20/12): compared to the previous scan, the size of the abdominal aneurysm and iliac aneurysm has remained essentially the same. However, on tonight study rather than one flow lumen there are 2 flow lumens suggesting dissection with flow now demonstrated within a previously thrombosed portion of the aneurysm to the level of the bifurcation.  Jesse Le is a 67 y.o. 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 This was for a 12 cm thoracic aortic aneurysm. He was not a candidate for open repair. He had been doing well and was last seen by Dr. Myra Gianotti on 11/22/2011. 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 He also had a known left hypogastric artery aneurysm.  2 days ago he noted some day generalized abdominal pain and nausea. This came on fairly suddenly and he thought he developed a viral illness. This eased up and then resolved. However, yesterday he developed worsening abdominal pain and nausea. The pain was generalized. He denied back pain. The pain has subsequently completely resolved. However, he came to the emergency department and CT scan suggested dissection in the infrarenal aorta. Vascular surgery was consulted.  He has no abdominal pain currently. He has no leg pain or paresthesias. He has no nausea currently. The patient is having intermittent abdominal pain. Dr. Myra Gianotti reviewed his CT scan which is markedly  different within the infrarenal aorta, when compared to November. There is a new flow channel present. There has not been significant change in the diameter of his aorta. Dr. Myra Gianotti suspects that the  patient is becoming symptomatic from his new dissection within the infrarenal maximum aortic diameters are in the 5-1/2 cm range. 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 At this point Dr,. Brabham has recommended pt be transferred to [facility] for complex endovascular repair by Dr. Pattricia Boss.   Consults:  Treatment Team:  Chuck Hint, MD Nada Libman, MD  Significant Diagnostic Studies: CBC Lab Results  Component Value Date   WBC 11.1* 02/20/2012   HGB 11.6* 02/20/2012   HCT 34.0* 02/20/2012   MCV 77.7* 02/20/2012   PLT 209 02/20/2012    BMET    Component Value Date/Time   NA 138 02/20/2012 0109   K 3.2* 02/20/2012 0109   CL 99 02/20/2012 0109   CO2 27 02/20/2012 0051   GLUCOSE 203* 02/20/2012 0109   BUN 12 02/20/2012 0109   CREATININE 1.50* 02/20/2012 0109   CALCIUM 9.1 02/20/2012 0051   GFRNONAA 52* 02/20/2012 0051   GFRAA 60* 02/20/2012 0051   COAG Lab Results  Component Value Date   INR 1.25 02/20/2012   INR 1.42 10/14/2011   INR 1.27 10/13/2011   No current facility-administered medications on file prior to encounter.   Current Outpatient Prescriptions on File Prior to Encounter  Medication Sig Dispense Refill  . amLODipine (NORVASC) 5 MG tablet Take 1 tablet (5 mg total) by mouth daily.  30 tablet  3  . metoprolol succinate (TOPROL-XL) 100 MG 24 hr tablet Take 1 tablet (100 mg total) by mouth daily. Take with or immediately following a meal.  30 tablet  3  . pantoprazole (PROTONIX) 40 MG tablet Take 1 tablet (40 mg total) by mouth daily at 12 noon.  30 tablet  3  . simvastatin (ZOCOR) 20 MG tablet Take 20 mg by mouth every evening.        Disposition:  Discharge to :[facility]  Future Appointments Provider Department Dept Phone   05/22/2012 11:00 AM [facility] [place] [facility] AT [facility] [phone]   05/22/2012 11:30 AM [facility] [place] [facility] AT [facility] [phone]   Patient to arrive 15 minutes prior to  appointment time.   05/25/2012 9:30 AM Delight Ovens, MD [facility] [place] [phone]   05/29/2012 1:15 PM Nada Libman, MD [facility] [phone]     Current Facility-Administered Medications  Medication Dose Route Frequency Provider Last Rate Last Dose  . 0.9 % NaCl with KCl 20 mEq/ L  infusion   Intravenous Continuous Chuck Hint, MD 50 mL/hr at 02/22/12 0332 50 mL at 02/22/12 0332  . acetaminophen (TYLENOL) tablet 325-650 mg  325-650 mg Oral Q4H PRN Chuck Hint, MD       Or  . acetaminophen (TYLENOL) suppository 325-650 mg  325-650 mg Rectal Q4H PRN Chuck Hint, MD      . alum & mag hydroxide-simeth (MAALOX/MYLANTA) 200-200-20 MG/5ML suspension 15-30 mL  15-30 mL Oral Q2H PRN Chuck Hint, MD      . amLODipine (NORVASC) tablet 5 mg  5 mg Oral Daily Amelia Jo [place], [place]      . aspirin EC tablet 81 mg  81 mg Oral Daily Amelia Jo [place], [place]      .  enoxaparin (LOVENOX) injection 40 mg  40 mg Subcutaneous Daily Chuck Hint, MD   40 mg at 02/22/12 0931  . esmolol (BREVIBLOC)  2000 mg / 100 mL infusion  25-300 mcg/kg/min Intravenous Titrated Chuck Hint, MD 27.8 mL/hr at 02/22/12 0733 85 mcg/kg/min at 02/22/12 0733  . guaiFENesin-dextromethorphan (ROBITUSSIN DM) 100-10 MG/5ML syrup 15 mL  15 mL Oral Q4H PRN Chuck Hint, MD      . hydrALAZINE (APRESOLINE) injection 10 mg  10 mg Intravenous Q2H PRN Chuck Hint, MD      . HYDROmorphone (DILAUDID) injection 1 mg  1 mg Intravenous Q2H PRN Sunnie Nielsen, MD   1 mg at 02/22/12 0654  . labetalol (NORMODYNE,TRANDATE) injection 10 mg  10 mg Intravenous Q2H PRN Chuck Hint, MD      . metoprolol (LOPRESSOR) injection 2-5 mg  2-5 mg Intravenous Q2H PRN Chuck Hint, MD      . ondansetron [facility]) injection 4 mg  4 mg Intravenous Q6H PRN Chuck Hint, MD      .  oxyCODONE-acetaminophen (PERCOCET/ROXICET) 5-325 MG per tablet 1-2 tablet  1-2 tablet Oral Q4H PRN Chuck Hint, MD   2 tablet at 02/20/12 1449  . pantoprazole (PROTONIX) EC tablet 40 mg  40 mg Oral Daily Chuck Hint, MD   40 mg at 02/22/12 0931  . phenol (CHLORASEPTIC) mouth spray 1 spray  1 spray Mouth/Throat PRN Chuck Hint, MD      . potassium chloride SA (K-DUR,KLOR-CON) CR tablet 20-40 mEq  20-40 mEq Oral Once Chuck Hint, MD        Plan: Transfer to [facility] - to Dr. Pattricia Boss service Transfer with esmolol drip for BP control  Signed: Marlowe Shores 02/22/2012, 9:12 AM

## 2012-02-25 NOTE — Discharge Summary (Signed)
I agree with the above  Wells Apollos Tenbrink 

## 2012-05-22 ENCOUNTER — Ambulatory Visit: Payer: Medicare Other | Admitting: Surgery

## 2012-05-22 ENCOUNTER — Inpatient Hospital Stay: Admission: RE | Admit: 2012-05-22 | Payer: Medicare Other | Source: Ambulatory Visit

## 2012-05-23 ENCOUNTER — Telehealth: Payer: Self-pay | Admitting: Surgery

## 2012-05-23 NOTE — Telephone Encounter (Signed)
Spoke with Cathy's daughter. Pt missed his CT's yesterday. Phone has been busy since then. She said she saw Sherrine Maples and [place] on Sunday. She will check on them and have him call us - kf

## 2012-05-24 ENCOUNTER — Other Ambulatory Visit: Payer: Self-pay | Admitting: *Deleted

## 2012-05-25 ENCOUNTER — Encounter: Payer: Medicare Other | Admitting: Cardiothoracic Surgery

## 2012-05-29 ENCOUNTER — Ambulatory Visit: Payer: Medicare Other | Admitting: Surgery

## 2012-06-02 ENCOUNTER — Telehealth: Payer: Self-pay | Admitting: Surgery

## 2012-06-02 NOTE — Telephone Encounter (Signed)
Spoke with Jesse Le's daughter for the third time. She has told them they need to call to r/s appt and CTA. "they are having phone problems". She assured me she would remind them again to call to r/s - kf  (06/02/12)

## 2012-06-29 ENCOUNTER — Encounter: Payer: Medicare Other | Admitting: Cardiothoracic Surgery

## 2012-08-24 ENCOUNTER — Emergency Department (HOSPITAL_COMMUNITY)
Admission: EM | Admit: 2012-08-24 | Discharge: 2012-08-24 | Disposition: A | Discharge status: Home / Self Care | Payer: Medicare Other | Attending: Emergency Medicine | Admitting: Emergency Medicine

## 2012-08-24 ENCOUNTER — Emergency Department (HOSPITAL_COMMUNITY): Payer: Medicare Other

## 2012-08-24 ENCOUNTER — Encounter (HOSPITAL_COMMUNITY): Payer: Self-pay | Admitting: *Deleted

## 2012-08-24 DIAGNOSIS — K625 Hemorrhage of anus and rectum: Secondary | ICD-10-CM

## 2012-08-24 DIAGNOSIS — I10 Essential (primary) hypertension: Secondary | ICD-10-CM | POA: Insufficient documentation

## 2012-08-24 DIAGNOSIS — Z79899 Other long term (current) drug therapy: Secondary | ICD-10-CM | POA: Insufficient documentation

## 2012-08-24 DIAGNOSIS — K921 Melena: Secondary | ICD-10-CM | POA: Insufficient documentation

## 2012-08-24 DIAGNOSIS — I2510 Atherosclerotic heart disease of native coronary artery without angina pectoris: Secondary | ICD-10-CM | POA: Insufficient documentation

## 2012-08-24 DIAGNOSIS — I252 Old myocardial infarction: Secondary | ICD-10-CM | POA: Insufficient documentation

## 2012-08-24 DIAGNOSIS — Z87891 Personal history of nicotine dependence: Secondary | ICD-10-CM | POA: Insufficient documentation

## 2012-08-24 DIAGNOSIS — Z7982 Long term (current) use of aspirin: Secondary | ICD-10-CM | POA: Insufficient documentation

## 2012-08-24 LAB — COMPREHENSIVE METABOLIC PANEL
Albumin: 2.9 g/dL — ABNORMAL LOW (ref 3.5–5.2)
BUN: 25 mg/dL — ABNORMAL HIGH (ref 6–23)
CO2: 27 mEq/L (ref 19–32)
Calcium: 9.1 mg/dL (ref 8.4–10.5)
Chloride: 98 mEq/L (ref 96–112)
Creatinine, Ser: 1.59 mg/dL — ABNORMAL HIGH (ref 0.50–1.35)
GFR calc non Af Amer: 43 mL/min — ABNORMAL LOW (ref >90)
Total Bilirubin: 0.3 mg/dL (ref 0.3–1.2)

## 2012-08-24 LAB — OCCULT BLOOD, POC DEVICE: Fecal Occult Bld: POSITIVE — AB

## 2012-08-24 LAB — CBC
HCT: 29.9 % — ABNORMAL LOW (ref 39.0–52.0)
MCH: 22.5 pg — ABNORMAL LOW (ref 26.0–34.0)
MCV: 74 fL — ABNORMAL LOW (ref 78.0–100.0)
RDW: 15.6 % — ABNORMAL HIGH (ref 11.5–15.5)
WBC: 7 10*3/uL (ref 4.0–10.5)

## 2012-08-24 MED ORDER — IOHEXOL 300 MG/ML  SOLN
80.0000 mL | Freq: Once | INTRAMUSCULAR | Status: AC | PRN
  Administered 2012-08-24: 80 mL via INTRAVENOUS

## 2012-08-24 MED ORDER — IOHEXOL 300 MG/ML  SOLN
25.0000 mL | INTRAMUSCULAR | Status: AC
  Administered 2012-08-24: 25 mL via ORAL

## 2012-08-24 NOTE — ED Notes (Signed)
Patient transported to CT 

## 2012-08-24 NOTE — ED Notes (Signed)
To ED for eval for bright red blood in stool. 2 episodes yesterday. Denies pain in abd. States he had aneurysm repair at [facility] in March. No vomiting. Skin w/d, resp e/u.

## 2012-08-24 NOTE — ED Provider Notes (Signed)
CSN: [id number]     Arrival date & time 08/24/12  1141 History     First MD Initiated Contact with Patient 08/24/12 1221     Chief Complaint  Patient presents with  . Rectal Bleeding   (Consider location/radiation/quality/duration/timing/severity/associated sxs/prior Treatment) HPI Comments: Patient is a 67 year old male who presents today with rectal bleeding since yesterday. He noticed bright red blood per rectum after his afternoon bowel movement. Yesterday he began having loose bowel movements x2. He has not had any bowel movements today. He states he has history of hemorrhoids, but this does not feel like his hemorrhoids. No pain during either his bowel movements yesterday. No darkening of his stools or black tarry stools. Nothing makes the BRBPR worse. Nothing makes it better. He states he otherwise feels quite well. He denies fatigue, syncope or presyncope, shortness of breath, chest pain, abdominal pain, nausea, vomiting, constipation, decrease in appetite.   Patient is a 67 y.o. male presenting with hematochezia. The history is provided by the patient. No language interpreter was used.  Rectal Bleeding Associated symptoms: no abdominal pain, no fever and no vomiting     Past Medical History  Diagnosis Date  . Coronary artery disease   . Hypertension   . Myocardial infarct    Past Surgical History  Procedure Laterality Date  . Video bronchoscopy  10/14/2011    Procedure: VIDEO BRONCHOSCOPY;  Surgeon: Nada Libman, MD;  Location: [facility] OR;  Service: Vascular;  Laterality: N/A;  . Intravascular ultrasound  10/14/2011    Procedure: INTRAVASCULAR ULTRASOUND;  Surgeon: Nada Libman, MD;  Location: [facility] OR;  Service: Vascular;  Laterality: N/A;  . Abdominal aortic aneurysm repair  10-14-11    TEVAR   History reviewed. No pertinent family history. History  Substance Use Topics  . Smoking status: Former Smoker -- 0.50 packs/day    Types: Cigarettes    Quit date: 07/20/2011  .  Smokeless tobacco: Not on file  . Alcohol Use: 0.6 oz/week    1 Shots of liquor per week     Comment: weekends    Review of Systems  Constitutional: Negative for fever, chills, diaphoresis, activity change, appetite change, fatigue and unexpected weight change.  Respiratory: Negative for shortness of breath.   Cardiovascular: Negative for chest pain.  Gastrointestinal: Positive for blood in stool and hematochezia. Negative for nausea, vomiting, abdominal pain and constipation.  All other systems reviewed and are negative.    Allergies  Review of patient's allergies indicates no known allergies.  Home Medications   Current Outpatient Rx  Name  Route  Sig  Dispense  Refill  . amLODipine (NORVASC) 5 MG tablet   Oral   Take 1 tablet (5 mg total) by mouth daily.   30 tablet   3   . aspirin EC 81 MG tablet   Oral   Take 81 mg by mouth daily.         . chlorthalidone (HYGROTON) 25 MG tablet   Oral   Take 25 mg by mouth daily.         . clopidogrel (PLAVIX) 75 MG tablet   Oral   Take 75 mg by mouth daily.         . hydrochlorothiazide (MICROZIDE) 12.5 MG capsule   Oral   Take 12.5 mg by mouth daily.         . metoprolol (LOPRESSOR) 50 MG tablet   Oral   Take 150 mg by mouth 2 (two) times daily.  BP 122/61  Pulse 62  Temp(Src) 97.4 F (36.3 C) (Oral)  Resp 18  SpO2 100% Physical Exam  Nursing note and vitals reviewed. Constitutional: He is oriented to person, place, and time. He appears well-developed and well-nourished. No distress.  HENT:  Head: Normocephalic and atraumatic.  Right Ear: External ear normal.  Left Ear: External ear normal.  Nose: Nose normal.  Eyes: Conjunctivae are normal.  Neck: Normal range of motion. No tracheal deviation present.  Cardiovascular: Normal rate, regular rhythm and normal heart sounds.   Pulmonary/Chest: Effort normal and breath sounds normal. No stridor.  Abdominal: Soft. He exhibits no distension. There  is no tenderness. There is no rigidity and no guarding.  Protuberant abdomen  Genitourinary: Rectal exam shows tenderness. Rectal exam shows no external hemorrhoid, no internal hemorrhoid, no fissure, no mass and anal tone normal. Guaiac positive stool.  No gross blood seen on rectal exam.  Musculoskeletal: Normal range of motion.  Neurological: He is alert and oriented to person, place, and time.  Skin: Skin is warm and dry. He is not diaphoretic.  Psychiatric: He has a normal mood and affect. His behavior is normal.    ED Course   Procedures (including critical care time)  Labs Reviewed  CBC - Abnormal; Notable for the following:    RBC 4.04 (*)    Hemoglobin 9.1 (*)    HCT 29.9 (*)    MCV 74.0 (*)    MCH 22.5 (*)    RDW 15.6 (*)    All other components within normal limits  COMPREHENSIVE METABOLIC PANEL - Abnormal; Notable for the following:    Potassium 3.2 (*)    Glucose, Bld 119 (*)    BUN 25 (*)    Creatinine, Ser 1.59 (*)    Albumin 2.9 (*)    GFR calc non Af Amer 43 (*)    GFR calc Af Amer 50 (*)    All other components within normal limits  OCCULT BLOOD, POC DEVICE - Abnormal; Notable for the following:    Fecal Occult Bld POSITIVE (*)    All other components within normal limits   Ct Abdomen Pelvis W Contrast  08/24/2012   *RADIOLOGY REPORT*  Clinical Data: Rectal bleeding.  CT ABDOMEN AND PELVIS WITH CONTRAST  Technique:  Multidetector CT imaging of the abdomen and pelvis was performed following the standard protocol during bolus administration of intravenous contrast.  Contrast: 80mL OMNIPAQUE IOHEXOL 300 MG/ML  SOLN  Comparison: 02/20/2012  Findings: Lung bases:  Motion degradation.  Atelectasis at the left lung base.  Mild cardiomegaly.  Prior descending thoracic aortic aneurysm stent graft placement.  The native sac is decreased in size, measuring maximally 9.8 cm versus 10.5 cm on the prior.  Abdomen/pelvis:  Normal liver, spleen, stomach, pancreas.  Cholelithiasis without acute cholecystitis or biliary ductal dilatation.  Normal adrenal glands.  Interpolar right renal lesion which measures 32 HU and 1.7 cm on image 34/series 2.  On the prior exam, this was fluid density and 1.4 cm.  Moderate left renal atrophy with a cyst of 3.5 cm.  Too small to characterize right renal lesion also identified.  Status post aortic stent graft repair in the interval. Stents in the celiac axis, superior mesenteric artery, and right renal artery.  The native sac is decreased in size, measuring 5.2 cm today.  No surrounding hemorrhage. No retroperitoneal or retrocrural adenopathy.  Scattered colonic diverticula.  Normal terminal ileum and appendix.  The stent graft continues into the iliac  arteries.  Residual aneurysmal dilatation and of the left internal iliac artery with stent continuing within.  This measures 3.5 cm on image 66/series 2 versus 4.4 cm on the prior. Increased density within is felt to be calcified thrombus, and was similar on the prior exam.  No pelvic adenopathy.    Normal urinary bladder and prostate.  No significant free fluid.  Fat containing left inguinal hernia.  Fat containing ventral abdominal wall hernia.  Bones/Musculoskeletal:  Advanced lower lumbar spondylosis.  IMPRESSION:  1. No acute process or explanation for rectal bleeding. 2.  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  No acute complication identified.  3.  Cholelithiasis. 4.  Descending thoracic aortic stent graft is unchanged, with decreased size of native sac. 5.  Right renal lesion which measures greater than fluid density. Given fluid density on the prior exam, favored to represent a cyst which has undergone interval hemorrhage.   Original Report Authenticated By: Jeronimo Greaves, M.D.   1. BRBPR (bright red blood per rectum)     MDM  Painless bright red blood per exam. CT scan shows no acute process or explanation for rectal bleeding. Discussed that he needs to followup  with GI and has lost about 2 units of blood since February. Discussed strict return instructions and symptoms of anemia. Vital signs stable prior to discharge. Patient with no complaints other than the fact that he is very hungry. Discussed patient with Dr. Redgie Grayer who agrees with plan. Patient / Family / Caregiver informed of clinical course, understand medical decision-making process, and agree with plan.   Mora Bellman, PA-C 08/24/12 [phone]

## 2012-08-24 NOTE — ED Provider Notes (Signed)
Medical screening examination/treatment/procedure(s) were conducted as a shared visit with non-physician practitioner(s) and myself.  I personally evaluated the patient during the encounter   Darlys Gales, MD 08/24/12 [phone]

## 2012-09-19 ENCOUNTER — Ambulatory Visit
Admission: RE | Admit: 2012-09-19 | Discharge: 2012-09-19 | Disposition: A | Discharge status: Home / Self Care | Payer: Medicare Other | Source: Ambulatory Visit | Attending: Orthopedic Surgery | Admitting: Orthopedic Surgery

## 2012-09-19 ENCOUNTER — Other Ambulatory Visit: Payer: Self-pay | Admitting: Orthopedic Surgery

## 2012-09-19 DIAGNOSIS — M5431 Sciatica, right side: Secondary | ICD-10-CM

## 2012-09-19 DIAGNOSIS — M25551 Pain in right hip: Secondary | ICD-10-CM

## 2012-09-29 ENCOUNTER — Other Ambulatory Visit: Payer: Self-pay | Admitting: Orthopedic Surgery

## 2012-09-29 DIAGNOSIS — IMO0002 Reserved for concepts with insufficient information to code with codable children: Secondary | ICD-10-CM

## 2012-10-09 ENCOUNTER — Encounter (HOSPITAL_COMMUNITY): Payer: Self-pay | Admitting: Emergency Medicine

## 2012-10-09 ENCOUNTER — Inpatient Hospital Stay (HOSPITAL_COMMUNITY)
Admission: EM | Admit: 2012-10-09 | Discharge: 2012-10-13 | DRG: 638 | Disposition: A | Discharge status: Home / Self Care | Payer: Medicare Other | Attending: Cardiology | Admitting: Cardiology

## 2012-10-09 DIAGNOSIS — B961 Klebsiella pneumoniae [K. pneumoniae] as the cause of diseases classified elsewhere: Secondary | ICD-10-CM | POA: Diagnosis not present

## 2012-10-09 DIAGNOSIS — I252 Old myocardial infarction: Secondary | ICD-10-CM

## 2012-10-09 DIAGNOSIS — N179 Acute kidney failure, unspecified: Secondary | ICD-10-CM | POA: Diagnosis present

## 2012-10-09 DIAGNOSIS — Z7982 Long term (current) use of aspirin: Secondary | ICD-10-CM

## 2012-10-09 DIAGNOSIS — Z6831 Body mass index (BMI) 31.0-31.9, adult: Secondary | ICD-10-CM

## 2012-10-09 DIAGNOSIS — E860 Dehydration: Secondary | ICD-10-CM

## 2012-10-09 DIAGNOSIS — Z87891 Personal history of nicotine dependence: Secondary | ICD-10-CM

## 2012-10-09 DIAGNOSIS — R739 Hyperglycemia, unspecified: Secondary | ICD-10-CM

## 2012-10-09 DIAGNOSIS — E871 Hypo-osmolality and hyponatremia: Secondary | ICD-10-CM | POA: Diagnosis present

## 2012-10-09 DIAGNOSIS — E1101 Type 2 diabetes mellitus with hyperosmolarity with coma: Principal | ICD-10-CM | POA: Diagnosis present

## 2012-10-09 DIAGNOSIS — N183 Chronic kidney disease, stage 3 unspecified: Secondary | ICD-10-CM | POA: Diagnosis present

## 2012-10-09 DIAGNOSIS — D638 Anemia in other chronic diseases classified elsewhere: Secondary | ICD-10-CM | POA: Diagnosis present

## 2012-10-09 DIAGNOSIS — I129 Hypertensive chronic kidney disease with stage 1 through stage 4 chronic kidney disease, or unspecified chronic kidney disease: Secondary | ICD-10-CM | POA: Diagnosis present

## 2012-10-09 DIAGNOSIS — J449 Chronic obstructive pulmonary disease, unspecified: Secondary | ICD-10-CM | POA: Diagnosis present

## 2012-10-09 DIAGNOSIS — J4489 Other specified chronic obstructive pulmonary disease: Secondary | ICD-10-CM | POA: Diagnosis present

## 2012-10-09 DIAGNOSIS — I2510 Atherosclerotic heart disease of native coronary artery without angina pectoris: Secondary | ICD-10-CM | POA: Diagnosis present

## 2012-10-09 DIAGNOSIS — N390 Urinary tract infection, site not specified: Secondary | ICD-10-CM | POA: Diagnosis not present

## 2012-10-09 HISTORY — DX: Aortic aneurysm of unspecified site, without rupture: I71.9

## 2012-10-09 HISTORY — DX: Type 2 diabetes mellitus without complications: E11.9

## 2012-10-09 LAB — URINALYSIS, ROUTINE W REFLEX MICROSCOPIC
Bilirubin Urine: NEGATIVE
Glucose, UA: >1000 mg/dL — AB
Nitrite: NEGATIVE
Protein, ur: NEGATIVE mg/dL
Specific Gravity, Urine: 1.026 (ref 1.005–1.030)
pH: 5 (ref 5.0–8.0)

## 2012-10-09 LAB — URINE MICROSCOPIC-ADD ON

## 2012-10-09 LAB — BASIC METABOLIC PANEL
BUN: 49 mg/dL — ABNORMAL HIGH (ref 6–23)
Chloride: 82 mEq/L — ABNORMAL LOW (ref 96–112)
Chloride: 89 mEq/L — ABNORMAL LOW (ref 96–112)
GFR calc Af Amer: 40 mL/min — ABNORMAL LOW (ref >90)
GFR calc Af Amer: 42 mL/min — ABNORMAL LOW (ref >90)
GFR calc non Af Amer: 37 mL/min — ABNORMAL LOW (ref >90)
Potassium: 4.3 mEq/L (ref 3.5–5.1)
Potassium: 4.3 mEq/L (ref 3.5–5.1)
Sodium: 128 mEq/L — ABNORMAL LOW (ref 135–145)

## 2012-10-09 LAB — CBC
Platelets: 254 10*3/uL (ref 150–400)
RBC: 5.46 MIL/uL (ref 4.22–5.81)
RDW: 16.3 % — ABNORMAL HIGH (ref 11.5–15.5)
WBC: 9.6 10*3/uL (ref 4.0–10.5)

## 2012-10-09 LAB — GLUCOSE, CAPILLARY
Glucose-Capillary: >600 mg/dL (ref 70–99)
Glucose-Capillary: >600 mg/dL (ref 70–99)
Glucose-Capillary: >600 mg/dL (ref 70–99)
Glucose-Capillary: 255 mg/dL — ABNORMAL HIGH (ref 70–99)
Glucose-Capillary: 297 mg/dL — ABNORMAL HIGH (ref 70–99)

## 2012-10-09 LAB — POCT I-STAT TROPONIN I: Troponin i, poc: 0 ng/mL (ref 0.00–0.08)

## 2012-10-09 MED ORDER — INSULIN ASPART 100 UNIT/ML ~~LOC~~ SOLN
0.0000 [IU] | SUBCUTANEOUS | Status: DC
  Administered 2012-10-10 (×2): 7 [IU] via SUBCUTANEOUS
  Administered 2012-10-10: 2 [IU] via SUBCUTANEOUS
  Administered 2012-10-11 (×2): 3 [IU] via SUBCUTANEOUS
  Administered 2012-10-11: 5 [IU] via SUBCUTANEOUS
  Administered 2012-10-11: 9 [IU] via SUBCUTANEOUS
  Administered 2012-10-11 (×2): 3 [IU] via SUBCUTANEOUS
  Administered 2012-10-12: 5 [IU] via SUBCUTANEOUS
  Administered 2012-10-12: 7 [IU] via SUBCUTANEOUS
  Administered 2012-10-12 (×2): 5 [IU] via SUBCUTANEOUS
  Administered 2012-10-12: 7 [IU] via SUBCUTANEOUS
  Administered 2012-10-12: 2 [IU] via SUBCUTANEOUS
  Administered 2012-10-13: 3 [IU] via SUBCUTANEOUS
  Administered 2012-10-13: 2 [IU] via SUBCUTANEOUS

## 2012-10-09 MED ORDER — INSULIN GLARGINE 100 UNIT/ML ~~LOC~~ SOLN
10.0000 [IU] | Freq: Every day | SUBCUTANEOUS | Status: DC
  Filled 2012-10-09: qty 0.1

## 2012-10-09 MED ORDER — DEXTROSE 50 % IV SOLN: 25.0000 mL | INTRAVENOUS | Status: DC | PRN

## 2012-10-09 MED ORDER — SODIUM CHLORIDE 0.9 % IV BOLUS (SEPSIS)
1000.0000 mL | Freq: Once | INTRAVENOUS | Status: AC
  Administered 2012-10-09: 1000 mL via INTRAVENOUS

## 2012-10-09 MED ORDER — INSULIN REGULAR BOLUS VIA INFUSION
0.0000 [IU] | Freq: Three times a day (TID) | INTRAVENOUS | Status: DC
  Filled 2012-10-09: qty 10

## 2012-10-09 MED ORDER — SODIUM CHLORIDE 0.9 % IV SOLN
INTRAVENOUS | Status: DC
  Administered 2012-10-09 – 2012-10-10 (×2): via INTRAVENOUS
  Administered 2012-10-10: 150 mL/h via INTRAVENOUS
  Administered 2012-10-11 (×2): 1000 mL via INTRAVENOUS
  Administered 2012-10-11: 150 mL/h via INTRAVENOUS

## 2012-10-09 MED ORDER — SODIUM CHLORIDE 0.9 % IV SOLN
INTRAVENOUS | Status: DC
  Administered 2012-10-09: 9.8 [IU]/h via INTRAVENOUS
  Filled 2012-10-09: qty 1

## 2012-10-09 MED ORDER — SODIUM CHLORIDE 0.9 % IV SOLN
INTRAVENOUS | Status: DC
  Administered 2012-10-09: 5.4 [IU]/h via INTRAVENOUS
  Filled 2012-10-09 (×2): qty 1

## 2012-10-09 MED ORDER — ASPIRIN EC 81 MG PO TBEC
81.0000 mg | DELAYED_RELEASE_TABLET | Freq: Every day | ORAL | Status: DC
  Administered 2012-10-10 – 2012-10-12 (×3): 81 mg via ORAL
  Filled 2012-10-09 (×4): qty 1

## 2012-10-09 MED ORDER — HEPARIN SODIUM (PORCINE) 5000 UNIT/ML IJ SOLN
5000.0000 [IU] | Freq: Three times a day (TID) | INTRAMUSCULAR | Status: DC
  Administered 2012-10-09 – 2012-10-11 (×6): 5000 [IU] via SUBCUTANEOUS
  Filled 2012-10-09 (×8): qty 1

## 2012-10-09 MED ORDER — SODIUM CHLORIDE 0.9 % IV SOLN
INTRAVENOUS | Status: DC
  Filled 2012-10-09: qty 1

## 2012-10-09 MED ORDER — DEXTROSE-NACL 5-0.45 % IV SOLN
INTRAVENOUS | Status: DC
  Administered 2012-10-09: 17:00:00 via INTRAVENOUS

## 2012-10-09 MED ORDER — SODIUM CHLORIDE 0.9 % IV SOLN
INTRAVENOUS | Status: DC
  Administered 2012-10-09: 16:00:00 via INTRAVENOUS

## 2012-10-09 MED ORDER — ASPIRIN EC 81 MG PO TBEC: 81.0000 mg | DELAYED_RELEASE_TABLET | Freq: Every day | ORAL | Status: DC

## 2012-10-09 MED ORDER — PANTOPRAZOLE SODIUM 40 MG PO TBEC
40.0000 mg | DELAYED_RELEASE_TABLET | Freq: Every day | ORAL | Status: DC
  Administered 2012-10-10 – 2012-10-13 (×3): 40 mg via ORAL
  Filled 2012-10-09 (×3): qty 1

## 2012-10-09 MED ORDER — DEXTROSE-NACL 5-0.45 % IV SOLN
INTRAVENOUS | Status: DC
  Administered 2012-10-09: 22:00:00 via INTRAVENOUS

## 2012-10-09 MED ORDER — SODIUM CHLORIDE 0.9 % IV SOLN: INTRAVENOUS | Status: DC

## 2012-10-09 MED ORDER — METOPROLOL TARTRATE 25 MG PO TABS
25.0000 mg | ORAL_TABLET | Freq: Two times a day (BID) | ORAL | Status: DC
  Administered 2012-10-09 – 2012-10-10 (×2): 25 mg via ORAL
  Filled 2012-10-09 (×3): qty 1

## 2012-10-09 MED ORDER — INSULIN ASPART 100 UNIT/ML ~~LOC~~ SOLN: 0.0000 [IU] | SUBCUTANEOUS | Status: DC

## 2012-10-09 MED ORDER — IBUPROFEN 200 MG PO TABS
400.0000 mg | ORAL_TABLET | Freq: Once | ORAL | Status: AC
  Administered 2012-10-09: 400 mg via ORAL
  Filled 2012-10-09: qty 2

## 2012-10-09 MED ORDER — INSULIN REGULAR BOLUS VIA INFUSION: 0.0000 [IU] | Freq: Three times a day (TID) | INTRAVENOUS | Status: DC

## 2012-10-09 MED ORDER — DEXTROSE 5 % IV SOLN
1.0000 g | INTRAVENOUS | Status: DC
  Administered 2012-10-09 – 2012-10-12 (×4): 1 g via INTRAVENOUS
  Filled 2012-10-09 (×5): qty 10

## 2012-10-09 MED ORDER — INSULIN GLARGINE 100 UNIT/ML ~~LOC~~ SOLN
10.0000 [IU] | SUBCUTANEOUS | Status: DC
  Administered 2012-10-10: 10 [IU] via SUBCUTANEOUS
  Filled 2012-10-09: qty 0.1

## 2012-10-09 NOTE — ED Notes (Signed)
Report given to 2600 unit nurse , IV insulin drip infusing at 26.3 units /hr. IV site unremarkable , respirations unlabored , denies pain at this time , family at bedside , nurse explained delay and plan of care .

## 2012-10-09 NOTE — ED Notes (Signed)
Patients cbg taken.  Reads over 600.  Jessica branch notifed.

## 2012-10-09 NOTE — ED Notes (Signed)
Attempted to call report to 2600 unit - can not take report at this time.

## 2012-10-09 NOTE — ED Provider Notes (Signed)
CSN: [id number]     Arrival date & time 10/09/12  1128 History   First MD Initiated Contact with Patient 10/09/12 1459     Chief Complaint  Patient presents with  . Dizziness  . Weight Loss   (Consider location/radiation/quality/duration/timing/severity/associated sxs/prior Treatment) HPI Has had one week of fatigue, polydipsia, polyuria, dry mouth, weight loss and lightheadedness. Patient states he's never been diagnosed with diabetes. He's had no recent change in his medications. Patient denies any new pain such as chest pain, abdominal pain or back pain. He does have chronic numbness to bilateral legs from chronic radiculopathy. No new swelling to his bilateral lower extremities Past Medical History  Diagnosis Date  . Coronary artery disease   . Hypertension   . Myocardial infarct    Past Surgical History  Procedure Laterality Date  . Video bronchoscopy  10/14/2011    Procedure: VIDEO BRONCHOSCOPY;  Surgeon: Nada Libman, MD;  Location: [facility] OR;  Service: Vascular;  Laterality: N/A;  . Intravascular ultrasound  10/14/2011    Procedure: INTRAVASCULAR ULTRASOUND;  Surgeon: Nada Libman, MD;  Location: [facility] OR;  Service: Vascular;  Laterality: N/A;  . Abdominal aortic aneurysm repair  10-14-11    TEVAR   History reviewed. No pertinent family history. History  Substance Use Topics  . Smoking status: Former Smoker -- 0.50 packs/day    Types: Cigarettes    Quit date: 07/20/2011  . Smokeless tobacco: Not on file  . Alcohol Use: 0.6 oz/week    1 Shots of liquor per week     Comment: weekends    Review of Systems  Constitutional: Positive for activity change, appetite change and unexpected weight change. Negative for fever and chills.  HENT: Negative for neck pain and neck stiffness.   Eyes: Negative for visual disturbance.  Respiratory: Negative for cough and shortness of breath.   Cardiovascular: Negative for chest pain, palpitations and leg swelling.  Gastrointestinal:  Negative for nausea, vomiting, abdominal pain, diarrhea and constipation.  Endocrine: Positive for polydipsia and polyuria.  Genitourinary: Positive for frequency. Negative for dysuria and flank pain.  Musculoskeletal: Negative for myalgias and back pain.  Skin: Negative for rash.  Neurological: Positive for dizziness and light-headedness. Negative for syncope, weakness, numbness and headaches.  All other systems reviewed and are negative.    Allergies  Review of patient's allergies indicates no known allergies.  Home Medications   Current Outpatient Rx  Name  Route  Sig  Dispense  Refill  . amLODipine (NORVASC) 10 MG tablet   Oral   Take 10 mg by mouth daily.         Marland Kitchen aspirin EC 81 MG tablet   Oral   Take 81 mg by mouth daily.         . chlorthalidone (HYGROTON) 25 MG tablet   Oral   Take 25 mg by mouth daily.         . metoprolol (LOPRESSOR) 50 MG tablet   Oral   Take 50 mg by mouth 2 (two) times daily.           BP 154/98  Pulse 74  Temp(Src) 97.6 F (36.4 C) (Oral)  Resp 12  Ht 6\' 1"  (1.854 m)  Wt 264 lb (119.75 kg)  BMI 34.84 kg/m2  SpO2 99% Physical Exam  Nursing note and vitals reviewed. Constitutional: He is oriented to person, place, and time. He appears well-developed and well-nourished. No distress.  HENT:  Head: Normocephalic and atraumatic.  Dry mucous membranes  and lips  Eyes: EOM are normal. Pupils are equal, round, and reactive to light.  Neck: Normal range of motion. Neck supple.  Full range of motion without any nuchal rigidity  Cardiovascular: Normal rate and regular rhythm.  Exam reveals no gallop and no friction rub.   No murmur heard. Pulmonary/Chest: Effort normal and breath sounds normal. No respiratory distress. He has no wheezes. He has no rales. He exhibits no tenderness.  Abdominal: Soft. Bowel sounds are normal. He exhibits no distension and no mass. There is no tenderness. There is no rebound and no guarding.  No pulsatile  abdominal masses  Musculoskeletal: Normal range of motion. He exhibits no edema and no tenderness.  No pedal edema or calf tenderness  Neurological: He is alert and oriented to person, place, and time.  Patient is alert and oriented x3 with clear, goal oriented speech. Patient has 5/5 motor in all extremities. Sensation is intact to light touch.   Skin: Skin is warm and dry. No rash noted. No erythema.  Psychiatric: He has a normal mood and affect. His behavior is normal.    ED Course  Procedures (including critical care time) Labs Review Labs Reviewed  CBC - Abnormal; Notable for the following:    Hemoglobin 12.0 (*)    HCT 37.8 (*)    MCV 69.2 (*)    MCH 22.0 (*)    RDW 16.3 (*)    All other components within normal limits  BASIC METABOLIC PANEL - Abnormal; Notable for the following:    Sodium 123 (*)    Chloride 82 (*)    Glucose, Bld 932 (*)    BUN 48 (*)    Creatinine, Ser 1.90 (*)    Calcium 11.1 (*)    GFR calc non Af Amer 35 (*)    GFR calc Af Amer 40 (*)    All other components within normal limits  URINALYSIS, ROUTINE W REFLEX MICROSCOPIC - Abnormal; Notable for the following:    APPearance HAZY (*)    Glucose, UA >1000 (*)    Hgb urine dipstick SMALL (*)    Leukocytes, UA SMALL (*)    All other components within normal limits  GLUCOSE, CAPILLARY - Abnormal; Notable for the following:    Glucose-Capillary >600 (*)    All other components within normal limits  URINE MICROSCOPIC-ADD ON - Abnormal; Notable for the following:    Squamous Epithelial / LPF FEW (*)    Bacteria, UA FEW (*)    All other components within normal limits  GLUCOSE, CAPILLARY - Abnormal; Notable for the following:    Glucose-Capillary >600 (*)    All other components within normal limits  URINE CULTURE  POCT I-STAT TROPONIN I   Imaging Review No results found.  Date: 10/09/2012  Rate: 79  Rhythm: normal sinus rhythm  QRS Axis: normal  Intervals: normal  ST/T Wave abnormalities:  normal  Conduction Disutrbances:none  Narrative Interpretation:   Old EKG Reviewed: none available   MDM   Patient with a normal bicarbonate and no ketones in the urine. I do not believe the patient is in ketoacidosis. He is likely dehydrated from his fluid loss due to his elevated glucose. We'll give IV fluids in the emergency department and started on a insulin drip. I discussed with Dr. Sharyn Lull. He'll see the patient in the emergency department and admit.  Loren Racer, MD 10/09/12 [phone]

## 2012-10-09 NOTE — ED Notes (Signed)
Pt here with family c/o polyuria and polydipsia with weight loss and dizziness

## 2012-10-09 NOTE — H&P (Signed)
Jesse Le is an 67 y.o. male.   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 And was noted to be hyponatremic with progressive worsening renal function. Patient received IV normal saline bolus and started on IV insulin in ER. Patient also complains of dysuria but denies any fever or chills. Denies any chest pain shortness of breath nausea vomiting. Denies any abdominal pain. Denies palpitation lightheadedness or syncope. Denies any PND orthopnea leg swelling.  Past Medical History  Diagnosis Date  . Coronary artery disease   . Hypertension   . Myocardial infarct     Past Surgical History  Procedure Laterality Date  . Video bronchoscopy  10/14/2011    Procedure: VIDEO BRONCHOSCOPY;  Surgeon: Nada Libman, MD;  Location: [facility] OR;  Service: Vascular;  Laterality: N/A;  . Intravascular ultrasound  10/14/2011    Procedure: INTRAVASCULAR ULTRASOUND;  Surgeon: Nada Libman, MD;  Location: [facility] OR;  Service: Vascular;  Laterality: N/A;  . Abdominal aortic aneurysm repair  10-14-11    TEVAR    History reviewed. No pertinent family history. Social History:  reports that he quit smoking about 14 months ago. His smoking use included Cigarettes. He smoked 0.50 packs per day. He does not have any smokeless tobacco history on file. He reports that he drinks about 0.6 ounces of alcohol per week. He reports that he does not use illicit  drugs.  Allergies: No Known Allergies   (Not in a hospital admission)  Results for orders placed during the hospital encounter of 10/09/12 (from the past 48 hour(s))  URINALYSIS, ROUTINE W REFLEX MICROSCOPIC     Status: Abnormal   Collection Time    10/09/12 11:47 AM      Result Value Range   Color, Urine YELLOW  YELLOW   APPearance HAZY (*) CLEAR   Specific Gravity, Urine 1.026  1.005 - 1.030   pH 5.0  5.0 - 8.0   Glucose, UA >1000 (*) NEGATIVE mg/dL   Hgb urine dipstick SMALL (*) NEGATIVE   Bilirubin Urine NEGATIVE  NEGATIVE   Ketones, ur NEGATIVE  NEGATIVE mg/dL   Protein, ur NEGATIVE  NEGATIVE mg/dL   Urobilinogen, UA 0.2  0.0 - 1.0 mg/dL   Nitrite NEGATIVE  NEGATIVE   Leukocytes, UA SMALL (*) NEGATIVE  URINE MICROSCOPIC-ADD ON     Status: Abnormal   Collection Time    10/09/12 11:47 AM      Result Value Range   Squamous Epithelial / LPF FEW (*) RARE   WBC, UA 21-50  <3 WBC/hpf   RBC / HPF 0-2  <3 RBC/hpf   Bacteria, UA FEW (*) RARE  CBC     Status: Abnormal   Collection Time    10/09/12 12:00 PM      Result Value Range   WBC 9.6  4.0 - 10.5 K/uL   RBC 5.46  4.22 - 5.81 MIL/uL   Hemoglobin 12.0 (*) 13.0 - 17.0 g/dL   HCT 16.1 (*) 09.6 - 04.5 %   MCV 69.2 (*) 78.0 - 100.0 fL  MCH 22.0 (*) 26.0 - 34.0 pg   MCHC 31.7  30.0 - 36.0 g/dL   RDW 54.0 (*) 98.1 - 19.1 %   Platelets 254  150 - 400 K/uL  BASIC METABOLIC PANEL     Status: Abnormal   Collection Time    10/09/12 12:00 PM      Result Value Range   Sodium 123 (*) 135 - 145 mEq/L   Potassium 4.3  3.5 - 5.1 mEq/L   Chloride 82 (*) 96 - 112 mEq/L   CO2 25  19 - 32 mEq/L   Glucose, Bld 932 (*) 70 - 99 mg/dL   Comment: CRITICAL RESULT CALLED TO, READ BACK BY AND VERIFIED WITH:     BARHAM,K RN 1311 10/09/12 LEONARD,A   BUN 48 (*) 6 - 23 mg/dL   Creatinine, Ser 4.78 (*) 0.50 - 1.35 mg/dL   Calcium 29.5 (*) 8.4 - 10.5 mg/dL   GFR calc non Af Amer 35 (*) >90 mL/min   GFR calc Af Amer 40 (*) >90 mL/min    Comment: (NOTE)     The eGFR has been calculated using the CKD EPI equation.     This calculation has not been validated in all clinical situations.     eGFR's persistently <90 mL/min signify possible Chronic Kidney     Disease.  GLUCOSE, CAPILLARY     Status: Abnormal   Collection Time    10/09/12 12:01 PM      Result Value Range   Glucose-Capillary >600 (*) 70 - 99 mg/dL   Comment 1 Documented in Chart     Comment 2 Notify RN    POCT I-STAT TROPONIN I     Status: None   Collection Time    10/09/12 12:09 PM      Result Value Range   Troponin i, poc 0.00  0.00 - 0.08 ng/mL   Comment 3            Comment: Due to the release kinetics of cTnI,     a negative result within the first hours     of the onset of symptoms does not rule out     myocardial infarction with certainty.     If myocardial infarction is still suspected,     repeat the test at appropriate intervals.  GLUCOSE, CAPILLARY     Status: Abnormal   Collection Time    10/09/12  3:42 PM      Result Value Range   Glucose-Capillary >600 (*) 70 - 99 mg/dL   Comment 1 Notify RN     No results found.  Review of Systems  Constitutional: Positive for malaise/fatigue. Negative for fever and chills.  HENT: Negative for hearing loss.   Eyes: Negative for blurred vision and double vision.  Respiratory: Negative for cough, hemoptysis, sputum production and shortness of breath.   Cardiovascular: Negative for chest pain, palpitations, orthopnea and leg swelling.  Gastrointestinal: Negative for nausea, vomiting, abdominal pain and diarrhea.  Genitourinary: Positive for dysuria, frequency, hematuria and flank pain. Negative for urgency.  Musculoskeletal: Negative for myalgias and joint pain.  Neurological: Positive for dizziness and weakness. Negative for headaches.  Endo/Heme/Allergies: Positive for polydipsia.    Blood pressure 154/98, pulse 74, temperature 97.6 F (36.4 C), temperature source Oral, resp. rate 12, height 6\' 1"   (1.854 m), weight 119.75 kg (264 lb), SpO2 99.00%. Physical Exam  Constitutional: He is oriented to person, place, and time.  HENT:  Head: Normocephalic and atraumatic.  Eyes:  Conjunctivae are normal. Left eye exhibits no discharge. No scleral icterus.  Neck: Normal range of motion. Neck supple. No JVD present. No tracheal deviation present. No thyromegaly present.  Cardiovascular: Normal rate and regular rhythm.   Murmur (soft systolic murmur noted no S3 gallop) heard. Respiratory: Effort normal and breath sounds normal. No respiratory distress. He has no wheezes. He has no rales.  GI: Soft. Bowel sounds are normal. He exhibits no distension. There is no tenderness. There is no rebound and no guarding.  Musculoskeletal: He exhibits no edema and no tenderness.  Neurological: He is alert and oriented to person, place, and time.  Skin: Skin is warm and dry.     Assessment/Plan Hyperosmolar nonketotic state Hypertension History of thoracic aneurysm dissection and infrarenal aortic dissection status post endovascular repair in the past COPD Mild CAD Hyponatremia Acute on chronic kidney disease stage III Anemia of chronic disease Morbid obesity Rule out UTI Plan As per orders  [facility] N 10/09/2012, 4:46 PM

## 2012-10-10 ENCOUNTER — Encounter (HOSPITAL_COMMUNITY): Payer: Self-pay | Admitting: General Practice

## 2012-10-10 ENCOUNTER — Other Ambulatory Visit: Payer: Medicare Other

## 2012-10-10 LAB — BASIC METABOLIC PANEL
CO2: 24 mEq/L (ref 19–32)
CO2: 21 mEq/L (ref 19–32)
Calcium: 11.2 mg/dL — ABNORMAL HIGH (ref 8.4–10.5)
Calcium: 10.7 mg/dL — ABNORMAL HIGH (ref 8.4–10.5)
Chloride: 99 mEq/L (ref 96–112)
Chloride: 99 mEq/L (ref 96–112)
Creatinine, Ser: 2.37 mg/dL — ABNORMAL HIGH (ref 0.50–1.35)
Creatinine, Ser: 2.37 mg/dL — ABNORMAL HIGH (ref 0.50–1.35)
GFR calc Af Amer: 31 mL/min — ABNORMAL LOW (ref >90)
GFR calc Af Amer: 31 mL/min — ABNORMAL LOW (ref >90)
GFR calc non Af Amer: 27 mL/min — ABNORMAL LOW (ref >90)
Glucose, Bld: 146 mg/dL — ABNORMAL HIGH (ref 70–99)
Sodium: 137 mEq/L (ref 135–145)

## 2012-10-10 LAB — GLUCOSE, CAPILLARY
Glucose-Capillary: 132 mg/dL — ABNORMAL HIGH (ref 70–99)
Glucose-Capillary: 164 mg/dL — ABNORMAL HIGH (ref 70–99)
Glucose-Capillary: 170 mg/dL — ABNORMAL HIGH (ref 70–99)
Glucose-Capillary: 261 mg/dL — ABNORMAL HIGH (ref 70–99)
Glucose-Capillary: 315 mg/dL — ABNORMAL HIGH (ref 70–99)

## 2012-10-10 LAB — HEMOGLOBIN A1C: Hgb A1c MFr Bld: 13 % — ABNORMAL HIGH (ref <5.7)

## 2012-10-10 LAB — CBC
HCT: 34.8 % — ABNORMAL LOW (ref 39.0–52.0)
Hemoglobin: 11.9 g/dL — ABNORMAL LOW (ref 13.0–17.0)
Hemoglobin: 11.2 g/dL — ABNORMAL LOW (ref 13.0–17.0)
MCH: 22.1 pg — ABNORMAL LOW (ref 26.0–34.0)
MCH: 22.4 pg — ABNORMAL LOW (ref 26.0–34.0)
MCHC: 31.9 g/dL (ref 30.0–36.0)
MCHC: 32.2 g/dL (ref 30.0–36.0)
MCV: 69.3 fL — ABNORMAL LOW (ref 78.0–100.0)
MCV: 69.7 fL — ABNORMAL LOW (ref 78.0–100.0)
Platelets: 144 10*3/uL — ABNORMAL LOW (ref 150–400)
RBC: 5.38 MIL/uL (ref 4.22–5.81)
RDW: 16.7 % — ABNORMAL HIGH (ref 11.5–15.5)
RDW: 16.8 % — ABNORMAL HIGH (ref 11.5–15.5)
WBC: 13.1 10*3/uL — ABNORMAL HIGH (ref 4.0–10.5)

## 2012-10-10 LAB — TSH: TSH: 0.86 u[IU]/mL (ref 0.350–4.500)

## 2012-10-10 LAB — MRSA PCR SCREENING: MRSA by PCR: NEGATIVE

## 2012-10-10 LAB — OSMOLALITY: Osmolality: 339 mOsm/kg — ABNORMAL HIGH (ref 275–300)

## 2012-10-10 MED ORDER — LIVING WELL WITH DIABETES BOOK
Freq: Once | Status: AC
  Administered 2012-10-10: 12:00:00
  Filled 2012-10-10: qty 1

## 2012-10-10 MED ORDER — METOPROLOL TARTRATE 50 MG PO TABS
50.0000 mg | ORAL_TABLET | Freq: Two times a day (BID) | ORAL | Status: DC
  Administered 2012-10-10 – 2012-10-11 (×2): 50 mg via ORAL
  Filled 2012-10-10 (×3): qty 1

## 2012-10-10 MED ORDER — INSULIN GLARGINE 100 UNIT/ML ~~LOC~~ SOLN
10.0000 [IU] | Freq: Two times a day (BID) | SUBCUTANEOUS | Status: DC
  Administered 2012-10-10 – 2012-10-11 (×2): 10 [IU] via SUBCUTANEOUS
  Filled 2012-10-10 (×3): qty 0.1

## 2012-10-10 NOTE — Progress Notes (Signed)
Inpatient Diabetes Program Recommendations  AACE/ADA: New Consensus Statement on Inpatient Glycemic Control (2013)  Target Ranges:  Prepandial:   less than 140 mg/dL      Peak postprandial:   less than 180 mg/dL (1-2 hours)      Critically ill patients:  140 - 180 mg/dL  Results for Jesse Le, Jesse Le (MRN [id number]) as of 10/10/2012 14:01  Ref. Range 10/10/2012 07:09 10/10/2012 08:18 10/10/2012 09:35 10/10/2012 11:07 10/10/2012 12:05  Glucose-Capillary Latest Range: 70-99 mg/dL 409 (H) 811 (H) 914 (H) 298 (H) 190 (H)   Inpatient Diabetes Program Recommendations HgbA1C: pending Diabetes Coordinator spoke with patient concerning new onset DM.  Basic inpatient education has been ordered.  RN will assist patient with viewing the diabetes videos 501-510.  Patient states he has a glucose meter at home.  He said it was mailed to him for free.  (unsure why he has one if he has never been diagnosed with DM?)  Recommended he check the expiration date on the strips before using them and to get new ones if expired.   Will follow. Thank you  Piedad Climes BSN, RN,CDE Inpatient Diabetes Coordinator [phone] (team pager)

## 2012-10-10 NOTE — Progress Notes (Signed)
Utilization Review Completed.  

## 2012-10-10 NOTE — Progress Notes (Signed)
Subjective:  Patient denies any chest pain shortness of breath or abdominal pain. Patient this metal controlled and is being weaned off from IV insulin.  Objective:  Vital Signs in the last 24 hours: Temp:  [97.5 F (36.4 C)-98.8 F (37.1 C)] 97.7 F (36.5 C) (09/30 0850) Pulse Rate:  [69-87] 79 (09/30 0850) Resp:  [12-18] 18 (09/30 0850) BP: (105-154)/(58-98) 140/82 mmHg (09/30 0850) SpO2:  [95 %-99 %] 98 % (09/30 0850) Weight:  [109.6 kg (241 lb 10 oz)-119.75 kg (264 lb)] 109.6 kg (241 lb 10 oz) (09/29 2127)  Intake/Output from previous day: 09/29 0701 - 09/30 0700 In: 230.7 [I.V.:230.7] Out: 900 [Urine:900] Intake/Output from this shift: Total I/O In: -  Out: 250 [Urine:250]  Physical Exam: Neck: no adenopathy, no carotid bruit, no JVD and supple, symmetrical, trachea midline Lungs: clear to auscultation bilaterally Heart: regular rate and rhythm, S1, S2 normal and Soft systolic murmur noted no S3 gallop Abdomen: soft, non-tender; bowel sounds normal; no masses,  no organomegaly Extremities: extremities normal, atraumatic, no cyanosis or edema  Lab Results:  Recent Labs  10/09/12 1200 10/10/12 0435  WBC 9.6 13.1*  HGB 12.0* 11.9*  PLT 254 144*    Recent Labs  10/09/12 1830 10/10/12 0435  NA 128* 137  K 4.3 3.8  CL 89* 99  CO2 21 21  GLUCOSE 718* 146*  BUN 49* 52*  CREATININE 1.83* 2.37*   No results found for this basename: TROPONINI, CK, MB,  in the last 72 hours Hepatic Function Panel No results found for this basename: PROT, ALBUMIN, AST, ALT, ALKPHOS, BILITOT, BILIDIR, IBILI,  in the last 72 hours No results found for this basename: CHOL,  in the last 72 hours No results found for this basename: PROTIME,  in the last 72 hours  Imaging: Imaging results have been reviewed and No results found.  Cardiac Studies:  Assessment/Plan:  Status post Hyperosmolar nonketotic state  Hypertension  History of thoracic aneurysm dissection and infrarenal  aortic dissection status post endovascular repair in the past  COPD  Mild CAD  Hyponatremia  Acute on chronic kidney disease stage III  Anemia of chronic disease  Morbid obesity  Rule out UTI Plan Change IV fluids to normal saline as per orders Wean off IV insulin drip Increase Lantus as per orders Check labs in a.m.  LOS: 1 day    Chanelle Hodsdon N 10/10/2012, 10:37 AM

## 2012-10-10 NOTE — Progress Notes (Signed)
Dr Sharyn Lull instructed RN to turn off Glucostabilizer two hours after giving Lantus; Glucostabilizer turned off at 1211; CBG 190 at that time; will continue to monitor.

## 2012-10-10 NOTE — Plan of Care (Signed)
Problem: Consults Goal: Diabetes Mellitus Patient Education See Patient Education Module for education specifics. Outcome: Progressing Pt given Living with Diabetes booklet and pt watched Basic Skills for Controlling Diabetes video Goal: Diagnosis-Diabetes Mellitus Outcome: Progressing New Onset Type II

## 2012-10-11 ENCOUNTER — Other Ambulatory Visit: Payer: Medicare Other

## 2012-10-11 LAB — CBC
HCT: 33.8 % — ABNORMAL LOW (ref 39.0–52.0)
Hemoglobin: 10.9 g/dL — ABNORMAL LOW (ref 13.0–17.0)
MCH: 22.8 pg — ABNORMAL LOW (ref 26.0–34.0)
MCV: 70.7 fL — ABNORMAL LOW (ref 78.0–100.0)
RBC: 4.78 MIL/uL (ref 4.22–5.81)
WBC: 8.7 10*3/uL (ref 4.0–10.5)

## 2012-10-11 LAB — URINE CULTURE: Colony Count: 60000

## 2012-10-11 LAB — BASIC METABOLIC PANEL
BUN: 36 mg/dL — ABNORMAL HIGH (ref 6–23)
BUN: 30 mg/dL — ABNORMAL HIGH (ref 6–23)
CO2: 22 mEq/L (ref 19–32)
CO2: 20 mEq/L (ref 19–32)
Calcium: 8.9 mg/dL (ref 8.4–10.5)
Chloride: 102 mEq/L (ref 96–112)
Creatinine, Ser: 1.57 mg/dL — ABNORMAL HIGH (ref 0.50–1.35)
GFR calc Af Amer: 51 mL/min — ABNORMAL LOW (ref >90)
GFR calc non Af Amer: 44 mL/min — ABNORMAL LOW (ref >90)
Glucose, Bld: 219 mg/dL — ABNORMAL HIGH (ref 70–99)
Glucose, Bld: 225 mg/dL — ABNORMAL HIGH (ref 70–99)
Potassium: 3.2 mEq/L — ABNORMAL LOW (ref 3.5–5.1)
Potassium: 3.3 mEq/L — ABNORMAL LOW (ref 3.5–5.1)
Sodium: 136 mEq/L (ref 135–145)

## 2012-10-11 LAB — GLUCOSE, CAPILLARY
Glucose-Capillary: 220 mg/dL — ABNORMAL HIGH (ref 70–99)
Glucose-Capillary: 238 mg/dL — ABNORMAL HIGH (ref 70–99)
Glucose-Capillary: 246 mg/dL — ABNORMAL HIGH (ref 70–99)
Glucose-Capillary: 248 mg/dL — ABNORMAL HIGH (ref 70–99)
Glucose-Capillary: 272 mg/dL — ABNORMAL HIGH (ref 70–99)
Glucose-Capillary: 367 mg/dL — ABNORMAL HIGH (ref 70–99)

## 2012-10-11 MED ORDER — INSULIN GLARGINE 100 UNIT/ML ~~LOC~~ SOLN
15.0000 [IU] | Freq: Two times a day (BID) | SUBCUTANEOUS | Status: DC
  Administered 2012-10-11 – 2012-10-12 (×2): 15 [IU] via SUBCUTANEOUS
  Filled 2012-10-11 (×3): qty 0.15

## 2012-10-11 MED ORDER — METOPROLOL TARTRATE 50 MG PO TABS
50.0000 mg | ORAL_TABLET | Freq: Three times a day (TID) | ORAL | Status: DC
  Administered 2012-10-11 – 2012-10-12 (×5): 50 mg via ORAL
  Filled 2012-10-11 (×8): qty 1

## 2012-10-11 MED ORDER — ACETAMINOPHEN 325 MG PO TABS: 650.0000 mg | ORAL_TABLET | Freq: Four times a day (QID) | ORAL | Status: DC | PRN

## 2012-10-11 NOTE — Progress Notes (Signed)
Inpatient Diabetes Program Recommendations  AACE/ADA: New Consensus Statement on Inpatient Glycemic Control (2013)  Target Ranges:  Prepandial:   less than 140 mg/dL      Peak postprandial:   less than 180 mg/dL (1-2 hours)      Critically ill patients:  140 - 180 mg/dL   Inpatient Diabetes Program Recommendations Insulin - Basal: Increase Lantus to 20 units  Correction (SSI): change Novolog to TID + HS scale since eating HgbA1C: =13 Thank you  Piedad Climes BSN, RN,CDE Inpatient Diabetes Coordinator [phone] (team pager)

## 2012-10-11 NOTE — Plan of Care (Signed)
Problem: Food- and Nutrition-Related Knowledge Deficit (NB-1.1) Goal: Nutrition education Formal process to instruct or train a patient/client in a skill or to impart knowledge to help patients/clients voluntarily manage or modify food choices and eating behavior to maintain or improve health. Outcome: Completed/Met Date Met:  10/11/12  RD consulted for nutrition education regarding diabetes.     Lab Results  Component Value Date    HGBA1C 13.0* 10/10/2012    RD provided "Carbohydrate Counting for People with Diabetes" handout from the Academy of [employer] Dietetics. RD with limited opportunity for education today.  Pt is eating lunch.  Pt states "I know what I need to do, I've already been through it all."  RD attempted to review basic information with pt who is dismissive and prefers to eat at this time.  RD left handout with other reading materials.  Body mass index is 31.89 kg/(m^2). Pt meets criteria for obese, class 1 based on current BMI.  Current diet order is CHO Mod Medium, patient is consuming approximately 50-100% of meals at this time. Labs and medications reviewed. No further nutrition interventions warranted at this time. RD contact information provided. If additional nutrition issues arise, please re-consult RD.  Loyce Dys, MS RD LDN Clinical Inpatient Dietitian Pager: [phone] Weekend/After hours pager: [phone]

## 2012-10-11 NOTE — Progress Notes (Signed)
Spoke with the patient and explained to him that he needs to watch the video regarding diabetes.  He informed me that he watched it yesterday.

## 2012-10-11 NOTE — Progress Notes (Signed)
Subjective:  Patient denies any chest pain or shortness of breath states feels better. Blood sugar still stays about 250. Renal function slowly improving  Objective:  Vital Signs in the last 24 hours: Temp:  [98 F (36.7 C)-98.1 F (36.7 C)] 98.1 F (36.7 C) (10/01 0837) Pulse Rate:  [64-84] 84 (10/01 0913) Resp:  [11-16] 11 (10/01 0837) BP: (127-164)/(78-90) 164/90 mmHg (10/01 0837) SpO2:  [95 %-100 %] 99 % (10/01 0837) Weight:  [109.6 kg (241 lb 10 oz)] 109.6 kg (241 lb 10 oz) (10/01 0449)  Intake/Output from previous day: 09/30 0701 - 10/01 0700 In: 2975 [I.V.:2925; IV Piggyback:50] Out: 1625 [Urine:1625] Intake/Output from this shift: Total I/O In: -  Out: 600 [Urine:600]  Physical Exam: Neck: no adenopathy, no carotid bruit, no JVD and supple, symmetrical, trachea midline Lungs: clear to auscultation bilaterally Heart: regular rate and rhythm, S1, S2 normal and Soft systolic murmur noted no S3 gallop Abdomen: soft, non-tender; bowel sounds normal; no masses,  no organomegaly  Lab Results:  Recent Labs  10/10/12 2310 10/11/12 0530  WBC 8.5 8.7  HGB 11.2* 10.9*  PLT 129* 112*    Recent Labs  10/10/12 2310 10/11/12 0530  NA 135 136  K 3.2* 3.3*  CL 100 102  CO2 22 20  GLUCOSE 219* 225*  BUN 36* 30*  CREATININE 1.57* 1.49*   No results found for this basename: TROPONINI, CK, MB,  in the last 72 hours Hepatic Function Panel No results found for this basename: PROT, ALBUMIN, AST, ALT, ALKPHOS, BILITOT, BILIDIR, IBILI,  in the last 72 hours No results found for this basename: CHOL,  in the last 72 hours No results found for this basename: PROTIME,  in the last 72 hours  Imaging: Imaging results have been reviewed and No results found.  Cardiac Studies:  Assessment/Plan:  Status post Hyperosmolar nonketotic state  Hypertension  History of thoracic aneurysm dissection and infrarenal aortic dissection status post endovascular repair in the past  COPD   Mild CAD  Hyponatremia  Acute on chronic kidney disease stage III  Anemia of chronic disease  Morbid obesity  Rule out UTI Plan Increase Lantus insulin as per orders OT PT consult Diabetic education  LOS: 2 days    Jesse Le 10/11/2012, 11:41 AM   .

## 2012-10-12 LAB — BASIC METABOLIC PANEL
BUN: 17 mg/dL (ref 6–23)
Calcium: 8.9 mg/dL (ref 8.4–10.5)
Creatinine, Ser: 1.28 mg/dL (ref 0.50–1.35)
GFR calc Af Amer: 65 mL/min — ABNORMAL LOW (ref >90)
GFR calc non Af Amer: 56 mL/min — ABNORMAL LOW (ref >90)
Glucose, Bld: 208 mg/dL — ABNORMAL HIGH (ref 70–99)

## 2012-10-12 LAB — CBC
Hemoglobin: 10.8 g/dL — ABNORMAL LOW (ref 13.0–17.0)
MCH: 22.6 pg — ABNORMAL LOW (ref 26.0–34.0)
MCHC: 31.6 g/dL (ref 30.0–36.0)
RDW: 17.5 % — ABNORMAL HIGH (ref 11.5–15.5)

## 2012-10-12 LAB — GLUCOSE, CAPILLARY
Glucose-Capillary: 251 mg/dL — ABNORMAL HIGH (ref 70–99)
Glucose-Capillary: 153 mg/dL — ABNORMAL HIGH (ref 70–99)
Glucose-Capillary: 251 mg/dL — ABNORMAL HIGH (ref 70–99)
Glucose-Capillary: 339 mg/dL — ABNORMAL HIGH (ref 70–99)

## 2012-10-12 LAB — URINE CULTURE: Colony Count: >=100000

## 2012-10-12 MED ORDER — SODIUM CHLORIDE 0.9 % IV SOLN: INTRAVENOUS | Status: DC | PRN

## 2012-10-12 MED ORDER — SODIUM CHLORIDE 0.9 % IJ SOLN
3.0000 mL | Freq: Two times a day (BID) | INTRAMUSCULAR | Status: DC
  Administered 2012-10-12 (×2): 3 mL via INTRAVENOUS

## 2012-10-12 MED ORDER — GLIMEPIRIDE 2 MG PO TABS
2.0000 mg | ORAL_TABLET | Freq: Every day | ORAL | Status: DC
  Administered 2012-10-13: 2 mg via ORAL
  Filled 2012-10-12 (×2): qty 1

## 2012-10-12 MED ORDER — METFORMIN HCL 500 MG PO TABS
500.0000 mg | ORAL_TABLET | Freq: Two times a day (BID) | ORAL | Status: DC
  Administered 2012-10-12 – 2012-10-13 (×2): 500 mg via ORAL
  Filled 2012-10-12 (×4): qty 1

## 2012-10-12 MED ORDER — SODIUM CHLORIDE 0.9 % IJ SOLN: 3.0000 mL | INTRAMUSCULAR | Status: DC | PRN

## 2012-10-12 NOTE — Progress Notes (Signed)
Subjective:  Patient is any chest pain or shortness of breath. Discussed regarding subcutaneous insulin wanted to try oral meds only. Renal function is back to baseline  Objective:  Vital Signs in the last 24 hours: Temp:  [98 F (36.7 C)-99.3 F (37.4 C)] 98.1 F (36.7 C) (10/02 0700) Pulse Rate:  [64-86] 72 (10/02 0700) Resp:  [14-17] 14 (10/02 0700) BP: (105-182)/(70-119) 146/74 mmHg (10/02 0824) SpO2:  [98 %-100 %] 100 % (10/02 0700) Weight:  [109 kg (240 lb 4.8 oz)] 109 kg (240 lb 4.8 oz) (10/02 0436)  Intake/Output from previous day: 10/01 0701 - 10/02 0700 In: 3050 [I.V.:3000; IV Piggyback:50] Out: 2325 [Urine:2325] Intake/Output from this shift: Total I/O In: 240 [P.O.:240] Out: 150 [Urine:150]  Physical Exam: Neck: no adenopathy, no carotid bruit, no JVD and supple, symmetrical, trachea midline Lungs: clear to auscultation bilaterally Heart: regular rate and rhythm, S1, S2 normal, no murmur, click, rub or gallop Abdomen: soft, non-tender; bowel sounds normal; no masses,  no organomegaly Extremities: extremities normal, atraumatic, no cyanosis or edema  Lab Results:  Recent Labs  10/11/12 0530 10/12/12 0515  WBC 8.7 9.2  HGB 10.9* 10.8*  PLT 112* 120*    Recent Labs  10/11/12 0530 10/12/12 0515  NA 136 133*  K 3.3* 3.3*  CL 102 100  CO2 20 23  GLUCOSE 225* 208*  BUN 30* 17  CREATININE 1.49* 1.28   No results found for this basename: TROPONINI, CK, MB,  in the last 72 hours Hepatic Function Panel No results found for this basename: PROT, ALBUMIN, AST, ALT, ALKPHOS, BILITOT, BILIDIR, IBILI,  in the last 72 hours No results found for this basename: CHOL,  in the last 72 hours No results found for this basename: PROTIME,  in the last 72 hours  Imaging: Imaging results have been reviewed and No results found.  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

## 2012-10-12 NOTE — Progress Notes (Signed)
Inpatient Diabetes Program Recommendations  AACE/ADA: New Consensus Statement on Inpatient Glycemic Control (2013)  Target Ranges:  Prepandial:   less than 140 mg/dL      Peak postprandial:   less than 180 mg/dL (1-2 hours)      Critically ill patients:  140 - 180 mg/dL   Inpatient Diabetes Program Recommendations Insulin - Basal: noted increase Correction (SSI): change Novolog to TID + HS scale since eating Insulin - Meal Coverage: add Novolog 4 units TID with meals per Glycemic Control Order set HgbA1C: =13  MD, will patient be discharged home on insulin?  Please address so that RN can begin bedside education.    Thank you  Piedad Climes BSN, RN,CDE Inpatient Diabetes Coordinator [phone] (team pager)

## 2012-10-12 NOTE — Evaluation (Signed)
Occupational Therapy Evaluation Patient Details Name: Jesse Le MRN: [id number] DOB: 11-Nov-1945 Today's Date: 10/12/2012 Time: 9562-1308 OT Time Calculation (min): 23 min  OT Assessment / Plan / Recommendation History of present illness Pt admitted with dizziness and weight loss. Has had one week of fatigue, polydipsia, polyuria, dry mouth, weight loss and lightheadedness.   Clinical Impression   Pt admitted with above.  Pt reports he feels that he is at his baseline level of functioning with ADLs and mobility and has 65/7 supervision at home.  No DME needs. Will sign off.    OT Assessment  Patient does not need any further OT services    Follow Up Recommendations  No OT follow up;Supervision/Assistance - 24 hour    Barriers to Discharge      Equipment Recommendations  None recommended by OT    Recommendations for Other Services    Frequency       Precautions / Restrictions     Pertinent Vitals/Pain See vitals    ADL  Eating/Feeding: Performed;Independent Where Assessed - Eating/Feeding: Chair Upper Body Bathing: Simulated;Supervision/safety Where Assessed - Upper Body Bathing: Unsupported sitting Lower Body Bathing: Simulated;Supervision/safety Where Assessed - Lower Body Bathing: Unsupported sit to stand Upper Body Dressing: Performed;Supervision/safety Where Assessed - Upper Body Dressing: Unsupported sitting Lower Body Dressing: Performed;Supervision/safety Where Assessed - Lower Body Dressing: Unsupported sit to stand Toilet Transfer: Simulated;Min guard Toilet Transfer Method: Sit to Barista:  (simulated with ambulation in hall and to chair) Equipment Used: Gait belt Transfers/Ambulation Related to ADLs: Pt ambulated with min guard and HHA in right hand to simulate cane. ADL Comments: Pt reports he feels he is at his baseline.  Stated multiple times that he has had therapy come out to his house before and does not feel that he needs it  again.     OT Diagnosis:    OT Problem List:   OT Treatment Interventions:     OT Goals(Current goals can be found in the care plan section) Acute Rehab OT Goals Patient Stated Goal: to return home today  Visit Information  Last OT Received On: 10/12/12 Assistance Needed: +1 History of Present Illness: Pt admitted with dizziness and weight loss. Has had one week of fatigue, polydipsia, polyuria, dry mouth, weight loss and lightheadedness.       Prior Functioning     Home Living Family/patient expects to be discharged to:: Private residence Living Arrangements: Spouse/significant other Available Help at Discharge: Family;Available 24 hours/day Type of Home: Apartment Home Access: Stairs to enter Entrance Stairs-Number of Steps: 2 Home Layout: One level Home Equipment: Walker - 2 wheels;Cane - single point;Shower seat;Bedside commode Prior Function Level of Independence: Independent with assistive device(s) (uses cane at home) Communication Communication: No difficulties         Vision/Perception     Cognition  Cognition Arousal/Alertness: Awake/alert Behavior During Therapy: WFL for tasks assessed/performed Overall Cognitive Status: Within Functional Limits for tasks assessed    Extremity/Trunk Assessment Upper Extremity Assessment Upper Extremity Assessment: Overall WFL for tasks assessed     Mobility Bed Mobility Bed Mobility: Supine to Sit;Sitting - Scoot to Edge of Bed Supine to Sit: 5: Supervision Sitting - Scoot to Edge of Bed: 5: Supervision Transfers Transfers: Sit to Stand;Stand to Sit Sit to Stand: 4: Min guard;From bed;With upper extremity assist Stand to Sit: 5: Supervision;To chair/3-in-1;With armrests;With upper extremity assist Details for Transfer Assistance: Min guard for safety. Pt did not need any phyiscal assist.  Exercise     Balance     End of Session OT - End of Session Equipment Utilized During Treatment: Gait belt Activity  Tolerance: Patient tolerated treatment well Patient left: in chair;with call bell/phone within reach Nurse Communication: Mobility status  GO    10/12/2012 Cipriano Mile OTR/L Pager [phone] Office [phone]  Cipriano Mile 10/12/2012, 10:51 AM

## 2012-10-12 NOTE — Progress Notes (Signed)
Patient informed of transfer to 2W26. Verbal report given to Ander Purpura, RN.

## 2012-10-12 NOTE — Evaluation (Signed)
Physical Therapy Evaluation Patient Details Name: Jesse Le MRN: [id number] DOB: [birth year] Today's Date: 10/12/2012 Time:  -     PT Assessment / Plan / Recommendation History of Present Illness  Pt admitted with dizziness and weight loss. Has had one week of fatigue, polydipsia, polyuria, dry mouth, weight loss and lightheadedness.  Clinical Impression  Pt admitted with the above. Pt currently with functional limitations due to the deficits listed below (see PT Problem List).  Pt currently refusing further therapy services and stated he was close to baseline.  Pt also reported that wife would assist him at home.  Since no therapy wanted by pt at this time.  PT will sign off.      PT Assessment  Patent does not need any further PT services    Follow Up Recommendations  No PT follow up;Supervision - Intermittent (refusing therapy services at this time. )    Equipment Recommendations  None recommended by PT    Precautions / Restrictions Precautions Precautions: Fall   Pertinent Vitals/Pain No c/o pain;        Mobility  Bed Mobility Bed Mobility: Supine to Sit;Sitting - Scoot to Edge of Bed Supine to Sit: 5: Supervision Sitting - Scoot to Edge of Bed: 5: Supervision Transfers Transfers: Sit to Stand;Stand to Sit Sit to Stand: 4: Min guard;From bed;With upper extremity assist Stand to Sit: 5: Supervision;To chair/3-in-1;With armrests;With upper extremity assist Details for Transfer Assistance: Min guard for safety. Pt did not need any phyiscal assist. Ambulation/Gait Ambulation/Gait Assistance: 4: Min guard;5: Supervision Ambulation Distance (Feet): 100 Feet Assistive device: 1 person hand held assist (simulate cane) Ambulation/Gait Assistance Details: minguard for safety with cues for upright posture and step sequence Gait Pattern: Step-through pattern;Decreased stride length Stairs: Yes Stairs Assistance: 4: Min assist Stairs Assistance Details (indicate cue type and  reason): (A) to maintain balance and cues for step sequence Stair Management Technique: One rail Left;Forwards Number of Stairs: 2    Exercises     PT Diagnosis:    PT Problem List:   PT Treatment Interventions:       PT Goals(Current goals can be found in the care plan section) Acute Rehab PT Goals Patient Stated Goal: to return home today PT Goal Formulation: No goals set, d/c therapy  Visit Information  Last PT Received On: 10/12/12 Assistance Needed: +1 PT/OT Co-Evaluation/Treatment: Yes History of Present Illness: Pt admitted with dizziness and weight loss. Has had one week of fatigue, polydipsia, polyuria, dry mouth, weight loss and lightheadedness.       Prior Functioning  Home Living Family/patient expects to be discharged to:: Private residence Living Arrangements: Spouse/significant other Available Help at Discharge: Family;Available 24 hours/day Type of Home: Apartment Home Access: Stairs to enter Entrance Stairs-Number of Steps: 2 Home Layout: One level Home Equipment: Walker - 2 wheels;Cane - single point;Shower seat;Bedside commode Prior Function Level of Independence: Independent with assistive device(s) (uses cane at home) Communication Communication: No difficulties    Cognition  Cognition Arousal/Alertness: Awake/alert Behavior During Therapy: WFL for tasks assessed/performed Overall Cognitive Status: Within Functional Limits for tasks assessed    Extremity/Trunk Assessment Upper Extremity Assessment Upper Extremity Assessment: Overall WFL for tasks assessed   Balance    End of Session PT - End of Session Equipment Utilized During Treatment: Gait belt Activity Tolerance: Patient tolerated treatment well Patient left: in chair;with call bell/phone within reach Nurse Communication: Mobility status  GP     Jesse Le 10/12/2012, 11:46 AM  Jake Shark, PT DPT  [phone]    

## 2012-10-13 LAB — HEPARIN INDUCED THROMBOCYTOPENIA PNL
Heparin Induced Plt Ab: NEGATIVE
Patient O.D.: 0.164
UFH Low Dose 0.1 IU/mL: 0 % Release
UFH Low Dose 0.5 IU/mL: 0 % Release
UFH SRA Result: NEGATIVE

## 2012-10-13 LAB — CBC
HCT: 31.1 % — ABNORMAL LOW (ref 39.0–52.0)
Platelets: 126 10*3/uL — ABNORMAL LOW (ref 150–400)
RBC: 4.35 MIL/uL (ref 4.22–5.81)
RDW: 17.8 % — ABNORMAL HIGH (ref 11.5–15.5)
WBC: 8.7 10*3/uL (ref 4.0–10.5)

## 2012-10-13 LAB — BASIC METABOLIC PANEL
BUN: 12 mg/dL (ref 6–23)
CO2: 24 mEq/L (ref 19–32)
Chloride: 100 mEq/L (ref 96–112)
Creatinine, Ser: 1.3 mg/dL (ref 0.50–1.35)
GFR calc Af Amer: 64 mL/min — ABNORMAL LOW (ref >90)
Potassium: 3.1 mEq/L — ABNORMAL LOW (ref 3.5–5.1)

## 2012-10-13 LAB — GLUCOSE, CAPILLARY
Glucose-Capillary: 171 mg/dL — ABNORMAL HIGH (ref 70–99)
Glucose-Capillary: 248 mg/dL — ABNORMAL HIGH (ref 70–99)
Glucose-Capillary: 313 mg/dL — ABNORMAL HIGH (ref 70–99)
Glucose-Capillary: 318 mg/dL — ABNORMAL HIGH (ref 70–99)

## 2012-10-13 MED ORDER — POTASSIUM CHLORIDE CRYS ER 20 MEQ PO TBCR: 40.0000 meq | EXTENDED_RELEASE_TABLET | Freq: Once | ORAL | Status: DC

## 2012-10-13 MED ORDER — CIPROFLOXACIN HCL 500 MG PO TABS: 500.0000 mg | ORAL_TABLET | Freq: Two times a day (BID) | ORAL | Status: DC

## 2012-10-13 MED ORDER — METOPROLOL TARTRATE 50 MG PO TABS: 50.0000 mg | ORAL_TABLET | Freq: Three times a day (TID) | ORAL | Status: DC

## 2012-10-13 MED ORDER — METFORMIN HCL 500 MG PO TABS: 500.0000 mg | ORAL_TABLET | Freq: Two times a day (BID) | ORAL | Status: DC

## 2012-10-13 MED ORDER — GLIMEPIRIDE 2 MG PO TABS: 2.0000 mg | ORAL_TABLET | Freq: Every day | ORAL | Status: DC

## 2012-10-13 NOTE — Progress Notes (Signed)
Discharged to home with family office visits in place teaching done  

## 2012-10-13 NOTE — Care Management Note (Addendum)
    Page 1 of 2   10/13/2012     1:36:42 PM   CARE MANAGEMENT NOTE 10/13/2012  Patient:  Jesse Le,Jesse Le   Account Number:  [id number]  Date Initiated:  10/13/2012  Documentation initiated by:  MAYO,HENRIETTA  Subjective/Objective Assessment:   adm with dx of hyperglycemia, lives with SO, has cane, walker, BSC, shower chair, h/o home health services through [facility]     Action/Plan:   Anticipated DC Date:  10/13/2012   Anticipated DC Plan:  HOME W HOME HEALTH SERVICES      DC Planning Services  CM consult      [facility] Choice  HOME HEALTH   Choice offered to / List presented to:  C-1 Patient        HH arranged  HH-1 RN  HH-10 DISEASE MANAGEMENT      HH agency  [facility]   Status of service:  Completed, signed off Medicare Important Message given?   (If response is "NO", the following Medicare IM given date fields will be blank) Date Medicare IM given:   Date Additional Medicare IM given:    Discharge Disposition:  HOME W HOME HEALTH SERVICES  Per UR Regulation:  Reviewed for med. necessity/level of care/duration of stay  If discussed at Long Length of Stay Meetings, dates discussed:    Comments:  10/13/12 Rosalita Chessman [phone] PT FOR DC HOME TODAY.  NOTIFIED [facility] OF DC DATE.  START OF CARE 24-48H POST DC DATE.  10/12/12 1352 Henrietta Mayo RN MSN BSN CCM Pt with new dx of DM.  Discussed home health RN for monitoring/education and pt agrees.  Provided list of home health agencies, referral made per choice.

## 2012-10-13 NOTE — Discharge Summary (Signed)
NAMELOWEN, BARRINGER NO.:  [id number]  MEDICAL RECORD NO.:  [id number]  LOCATION:  2W26C                        FACILITY:  [facility]  PHYSICIAN:  Melquiades Kovar N. Sharyn Lull, M.D. DATE OF BIRTH:  [birth year]  DATE OF ADMISSION:  10/09/2012 DATE OF DISCHARGE:  10/13/2012                              DISCHARGE SUMMARY   ADMITTING DIAGNOSES: 1. Hyperosmolar nonketotic state. 2. Hypertension. 3. History of thoracic aneurysm dissection and also infrarenal aortic     dissection status post endovascular repair in the past. 4. Chronic obstructive pulmonary disease. 5. Mild coronary artery disease. 6. Hyponatremia. 7. Acute on chronic kidney disease, stage III. 8. Anemia of chronic disease. 9. Morbid obesity. 10.Probable urinary tract infection.  FINAL DIAGNOSES: 1. Status post hyperosmolar nonketotic state. 2. New onset diabetes mellitus. 3. Hypertension. 4. History of thoracic aneurysm dissection/intrarenal aortic     dissection status post endovascular repair in the past. 5. Chronic obstructive pulmonary disease. 6. Mild coronary artery disease. 7. Status post hyponatremia. 8. Resolving acute on chronic kidney disease, stage III. 9. Anemia of chronic disease. 10.Morbid obesity. 11.Resolving Klebsiella urinary tract infection.  DISCHARGED HOME MEDICATIONS: 1. Ciprofloxacin 500 mg twice daily for 5 more days. 2. Amaryl 2 mg 1 tablet daily. 3. Metformin 500 g 1 tablet twice daily with meals. 4. Metoprolol 50 mg 3 times daily. 5. Amlodipine 10 mg 1 tablet daily. 6. Aspirin 81 mg 1 tablet daily. 7. The patient has been advised to stop chlorthalidone.  DIET:  Low-salt, low-cholesterol 1800 calories ADA diet.  The patient has been advised to monitor blood sugar twice daily and chart.  The patient has been advised to monitor blood pressure, also daily and chart follow up with me in 1 week.  CONDITION AT DISCHARGE:  Stable.  BRIEF HISTORY AND HOSPITAL COURSE:  Mr. 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  He came to the ER complaining of generalized weakness, poor appetite, polyuria, polydipsia, and dry mouth, and was noted to have blood sugar above 900.  He was also noted to be hyponatremic with progressive worsening renal function.  The patient received IV normal saline bolus and started on IV insulin in ER.  The patient also complains of dysuria, but denies any fever or chills. Denies any chest pain, shortness of breath, nausea, or vomiting.  Denies any abdominal pain.  Denies palpitation, lightheadedness, or syncope. Denies PND, orthopnea, or leg swelling.  PAST MEDICAL HISTORY:  As above.  PHYSICAL EXAMINATION:  GENERAL:  He is alert, awake, and oriented x3. VITAL SIGNS:  Blood pressure is 154/98, pulse is 74.  He was afebrile. EYES:  Conjunctivae was pink. NECK:  Supple.  No JVD.  No bruit. LUNGS:  Clear to auscultation without rhonchi or rales. CARDIOVASCULAR:  S1, S2 was normal.  There was soft systolic murmur.  No S3, gallop. ABDOMEN:  Soft.  Bowel sounds were present, nontender. EXTREMITIES:  There was no clubbing,  cyanosis, or edema. NEUROLOGIC:  Grossly intact.  LABORATORY DATA:  Sodium was 123, potassium 4.3, chloride 82, BUN was 48, creatinine 1.90, glucose was 932.  Hemoglobin was 12, hematocrit 37.8, white count of 9.6.  Urine culture was positive for Klebsiella pneumonia, which was sensitive to Rocephin and also ciprofloxacin. Repeat blood sugars by the lab were above 600.  On October 09, 2012, it was 297, 105, 132; this morning is 171.  Labs this morning, sodium is 133,  potassium 3.1, which is being replaced.  BUN is 12, creatinine is 1.30, glucose is 177.  Hemoglobin 9.8, hematocrit 31.8, white count of 8.7.  BRIEF HOSPITAL COURSE:  The patient was admitted to step-down unit.  The patient was started on IV insulin, received IV saline boluses which was switched to D5 half-normal saline.  Once her blood sugar was low at 250, the patient initially was started on Lantus insulin, but patient refused to be on insulin at this point, and wanted to try hypoglycemic agents, patient was started on Amaryl and metformin.  His blood sugar stays between 170-300.  The patient is eating adequately.  Patient's urinary symptoms are completely resolved.  The patient remained afebrile during the hospital stay.  OT/PT consultation was obtained.  The patient is ambulating in hallway and room without any problems.  His renal function is back to baseline.  The patient will be discharged home on above medications and will be followed up in my office in 1 week.    Eduardo Osier. Sharyn Lull, M.D.    M[facility]/MEDQ  D:  10/13/2012  T:  10/13/2012  Job:  [id number]

## 2012-10-13 NOTE — Discharge Summary (Signed)
  Discharge summary dictated on 10/13/2012 dictation number is [phone]

## 2012-11-23 ENCOUNTER — Encounter: Payer: Self-pay | Admitting: Podiatry

## 2012-11-23 ENCOUNTER — Ambulatory Visit (INDEPENDENT_AMBULATORY_CARE_PROVIDER_SITE_OTHER): Payer: Medicare Other | Admitting: Podiatry

## 2012-11-23 VITALS — BP 168/90 | HR 74 | Resp 12 | Ht 73.0 in | Wt 262.0 lb

## 2012-11-23 DIAGNOSIS — M79609 Pain in unspecified limb: Secondary | ICD-10-CM

## 2012-11-23 DIAGNOSIS — M2040 Other hammer toe(s) (acquired), unspecified foot: Secondary | ICD-10-CM

## 2012-11-23 DIAGNOSIS — B351 Tinea unguium: Secondary | ICD-10-CM

## 2012-11-23 DIAGNOSIS — E1159 Type 2 diabetes mellitus with other circulatory complications: Secondary | ICD-10-CM

## 2012-11-26 NOTE — Progress Notes (Signed)
Subjective:     Patient ID: Jesse Le, male   DOB: [birth year], 67 y.o.   MRN: [id number]  HPI patient is found to have painful nailbeds with thickness 1-5 both feet   Review of Systems     Objective:   Physical Exam Neurovascular status intact. Nail disease with thickness debris and pain 1-5 both feet    Assessment:     Mycotic nail infection with pain 1-5 both feet    Plan:     Debridement of painful nailbeds 1-5 both feet

## 2012-12-22 ENCOUNTER — Other Ambulatory Visit: Payer: Self-pay | Admitting: Orthopedic Surgery

## 2012-12-22 DIAGNOSIS — M47816 Spondylosis without myelopathy or radiculopathy, lumbar region: Secondary | ICD-10-CM

## 2012-12-26 ENCOUNTER — Ambulatory Visit
Admission: RE | Admit: 2012-12-26 | Discharge: 2012-12-26 | Disposition: A | Discharge status: Home / Self Care | Payer: Medicare Other | Source: Ambulatory Visit | Attending: Orthopedic Surgery | Admitting: Orthopedic Surgery

## 2012-12-26 DIAGNOSIS — M47816 Spondylosis without myelopathy or radiculopathy, lumbar region: Secondary | ICD-10-CM

## 2013-01-05 ENCOUNTER — Other Ambulatory Visit: Payer: Medicare Other

## 2013-02-22 ENCOUNTER — Ambulatory Visit: Payer: Medicare Other | Admitting: Podiatry

## 2015-01-03 ENCOUNTER — Encounter (HOSPITAL_COMMUNITY): Payer: Self-pay | Admitting: Emergency Medicine

## 2015-01-03 ENCOUNTER — Emergency Department (HOSPITAL_COMMUNITY)
Admission: EM | Admit: 2015-01-03 | Discharge: 2015-01-04 | Disposition: A | Discharge status: Home / Self Care | Payer: Medicare Other | Attending: Emergency Medicine | Admitting: Emergency Medicine

## 2015-01-03 DIAGNOSIS — Y9389 Activity, other specified: Secondary | ICD-10-CM | POA: Diagnosis not present

## 2015-01-03 DIAGNOSIS — Z87891 Personal history of nicotine dependence: Secondary | ICD-10-CM | POA: Insufficient documentation

## 2015-01-03 DIAGNOSIS — Z23 Encounter for immunization: Secondary | ICD-10-CM | POA: Diagnosis not present

## 2015-01-03 DIAGNOSIS — Y998 Other external cause status: Secondary | ICD-10-CM | POA: Diagnosis not present

## 2015-01-03 DIAGNOSIS — S0101XA Laceration without foreign body of scalp, initial encounter: Secondary | ICD-10-CM | POA: Insufficient documentation

## 2015-01-03 DIAGNOSIS — Y9289 Other specified places as the place of occurrence of the external cause: Secondary | ICD-10-CM | POA: Insufficient documentation

## 2015-01-03 DIAGNOSIS — I10 Essential (primary) hypertension: Secondary | ICD-10-CM | POA: Diagnosis not present

## 2015-01-03 DIAGNOSIS — S0191XA Laceration without foreign body of unspecified part of head, initial encounter: Secondary | ICD-10-CM

## 2015-01-03 DIAGNOSIS — I252 Old myocardial infarction: Secondary | ICD-10-CM | POA: Diagnosis not present

## 2015-01-03 DIAGNOSIS — E119 Type 2 diabetes mellitus without complications: Secondary | ICD-10-CM | POA: Insufficient documentation

## 2015-01-03 DIAGNOSIS — I2510 Atherosclerotic heart disease of native coronary artery without angina pectoris: Secondary | ICD-10-CM | POA: Insufficient documentation

## 2015-01-03 DIAGNOSIS — Z7982 Long term (current) use of aspirin: Secondary | ICD-10-CM | POA: Insufficient documentation

## 2015-01-03 MED ORDER — TETANUS-DIPHTH-ACELL PERTUSSIS 5-2.5-18.5 LF-MCG/0.5 IM SUSP
0.5000 mL | Freq: Once | INTRAMUSCULAR | Status: AC
  Administered 2015-01-04: 0.5 mL via INTRAMUSCULAR
  Filled 2015-01-03: qty 0.5
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
## 2015-01-04 NOTE — Discharge Instructions (Signed)
Tissue Adhesive Wound Care Mr. Deford, your laceration was repaired with skin glue.  Keep area dry for 24 hours, then use normal soap and water to clean twice per day after that.  See your primary care doctor within 3 days for wound check.  For any worsening symptoms or concern for infection, come back to the ED immediately.  Thank you. Some cuts and wounds can be closed with tissue adhesive. Adhesive is like glue. It holds the skin together and helps a wound heal faster. This adhesive goes away on its own as the wound heals.  HOME CARE   Showers are allowed. Do not soak the wound in water. Do not take baths, swim, or use hot tubs. Do not use soaps or creams on your wound.  If a bandage (dressing) was put on, change it as often as told by your doctor.  Keep the bandage dry.  Do not scratch, pick, or rub the adhesive.  Do not put tape over the adhesive. The adhesive could come off.  Protect the wound from another injury.  Protect the wound from sun and tanning beds.  Only take medicine as told by your doctor.  Keep all doctor visits as told. GET HELP RIGHT AWAY IF:   Your wound is red, puffy (swollen), hot, or tender.  You get a rash after the glue is put on.  You have more pain in the wound.  You have a red streak going away from the wound.  You have yellowish-white fluid (pus) coming from the wound.  You have more bleeding.  You have a fever.  You have chills and start to shake.  You notice a bad smell coming from the wound.  Your wound or adhesive breaks open. MAKE SURE YOU:   Understand these instructions.  Will watch your condition.  Will get help right away if you are not doing well or get worse.   This information is not intended to replace advice given to you by your health care provider. Make sure you discuss any questions you have with your health care provider.   Document Released: 10/07/2007 Document Revised: 10/18/2012 Document Reviewed:  07/19/2012 Elsevier Interactive Patient Education [employer].

## 2015-10-27 DIAGNOSIS — N189 Chronic kidney disease, unspecified: Secondary | ICD-10-CM | POA: Diagnosis not present

## 2015-10-27 DIAGNOSIS — E119 Type 2 diabetes mellitus without complications: Secondary | ICD-10-CM | POA: Diagnosis not present

## 2015-10-27 DIAGNOSIS — I129 Hypertensive chronic kidney disease with stage 1 through stage 4 chronic kidney disease, or unspecified chronic kidney disease: Secondary | ICD-10-CM | POA: Diagnosis not present

## 2015-10-27 DIAGNOSIS — E785 Hyperlipidemia, unspecified: Secondary | ICD-10-CM | POA: Diagnosis not present

## 2016-09-06 DIAGNOSIS — N189 Chronic kidney disease, unspecified: Secondary | ICD-10-CM | POA: Diagnosis not present

## 2016-09-06 DIAGNOSIS — E119 Type 2 diabetes mellitus without complications: Secondary | ICD-10-CM | POA: Diagnosis not present

## 2016-09-06 DIAGNOSIS — M1990 Unspecified osteoarthritis, unspecified site: Secondary | ICD-10-CM | POA: Diagnosis not present

## 2016-09-06 DIAGNOSIS — I10 Essential (primary) hypertension: Secondary | ICD-10-CM | POA: Diagnosis not present

## 2016-09-06 DIAGNOSIS — E785 Hyperlipidemia, unspecified: Secondary | ICD-10-CM | POA: Diagnosis not present

## 2017-05-06 ENCOUNTER — Inpatient Hospital Stay (HOSPITAL_COMMUNITY)
Admission: EM | Admit: 2017-05-06 | Discharge: 2017-06-21 | DRG: 329 | Disposition: A | Discharge status: Skilled Nursing Facility | Payer: Medicare Other | Attending: Cardiology | Admitting: Cardiology

## 2017-05-06 ENCOUNTER — Emergency Department (HOSPITAL_COMMUNITY): Payer: Medicare Other

## 2017-05-06 ENCOUNTER — Other Ambulatory Visit: Payer: Self-pay

## 2017-05-06 ENCOUNTER — Encounter (HOSPITAL_COMMUNITY): Payer: Self-pay

## 2017-05-06 DIAGNOSIS — I959 Hypotension, unspecified: Secondary | ICD-10-CM | POA: Diagnosis not present

## 2017-05-06 DIAGNOSIS — C19 Malignant neoplasm of rectosigmoid junction: Principal | ICD-10-CM | POA: Diagnosis present

## 2017-05-06 DIAGNOSIS — R1900 Intra-abdominal and pelvic swelling, mass and lump, unspecified site: Secondary | ICD-10-CM | POA: Diagnosis not present

## 2017-05-06 DIAGNOSIS — I129 Hypertensive chronic kidney disease with stage 1 through stage 4 chronic kidney disease, or unspecified chronic kidney disease: Secondary | ICD-10-CM | POA: Diagnosis not present

## 2017-05-06 DIAGNOSIS — N179 Acute kidney failure, unspecified: Secondary | ICD-10-CM | POA: Diagnosis not present

## 2017-05-06 DIAGNOSIS — N182 Chronic kidney disease, stage 2 (mild): Secondary | ICD-10-CM | POA: Diagnosis present

## 2017-05-06 DIAGNOSIS — N170 Acute kidney failure with tubular necrosis: Secondary | ICD-10-CM | POA: Diagnosis not present

## 2017-05-06 DIAGNOSIS — E1152 Type 2 diabetes mellitus with diabetic peripheral angiopathy with gangrene: Secondary | ICD-10-CM | POA: Diagnosis not present

## 2017-05-06 DIAGNOSIS — K9189 Other postprocedural complications and disorders of digestive system: Secondary | ICD-10-CM | POA: Diagnosis not present

## 2017-05-06 DIAGNOSIS — K912 Postsurgical malabsorption, not elsewhere classified: Secondary | ICD-10-CM | POA: Diagnosis not present

## 2017-05-06 DIAGNOSIS — Z9049 Acquired absence of other specified parts of digestive tract: Secondary | ICD-10-CM | POA: Diagnosis not present

## 2017-05-06 DIAGNOSIS — Z9289 Personal history of other medical treatment: Secondary | ICD-10-CM

## 2017-05-06 DIAGNOSIS — K439 Ventral hernia without obstruction or gangrene: Secondary | ICD-10-CM

## 2017-05-06 DIAGNOSIS — E11649 Type 2 diabetes mellitus with hypoglycemia without coma: Secondary | ICD-10-CM | POA: Diagnosis not present

## 2017-05-06 DIAGNOSIS — I10 Essential (primary) hypertension: Secondary | ICD-10-CM | POA: Diagnosis not present

## 2017-05-06 DIAGNOSIS — K921 Melena: Secondary | ICD-10-CM | POA: Diagnosis not present

## 2017-05-06 DIAGNOSIS — T82339A Leakage of unspecified vascular graft, initial encounter: Secondary | ICD-10-CM | POA: Diagnosis not present

## 2017-05-06 DIAGNOSIS — R262 Difficulty in walking, not elsewhere classified: Secondary | ICD-10-CM | POA: Diagnosis not present

## 2017-05-06 DIAGNOSIS — Z515 Encounter for palliative care: Secondary | ICD-10-CM

## 2017-05-06 DIAGNOSIS — Z959 Presence of cardiac and vascular implant and graft, unspecified: Secondary | ICD-10-CM

## 2017-05-06 DIAGNOSIS — J9601 Acute respiratory failure with hypoxia: Secondary | ICD-10-CM | POA: Diagnosis not present

## 2017-05-06 DIAGNOSIS — Z933 Colostomy status: Secondary | ICD-10-CM | POA: Diagnosis not present

## 2017-05-06 DIAGNOSIS — Z978 Presence of other specified devices: Secondary | ICD-10-CM | POA: Diagnosis not present

## 2017-05-06 DIAGNOSIS — I712 Thoracic aortic aneurysm, without rupture: Secondary | ICD-10-CM | POA: Diagnosis not present

## 2017-05-06 DIAGNOSIS — M2550 Pain in unspecified joint: Secondary | ICD-10-CM | POA: Diagnosis not present

## 2017-05-06 DIAGNOSIS — R918 Other nonspecific abnormal finding of lung field: Secondary | ICD-10-CM | POA: Diagnosis not present

## 2017-05-06 DIAGNOSIS — R5381 Other malaise: Secondary | ICD-10-CM | POA: Diagnosis not present

## 2017-05-06 DIAGNOSIS — R6521 Severe sepsis with septic shock: Secondary | ICD-10-CM | POA: Diagnosis not present

## 2017-05-06 DIAGNOSIS — Z433 Encounter for attention to colostomy: Secondary | ICD-10-CM | POA: Diagnosis not present

## 2017-05-06 DIAGNOSIS — I471 Supraventricular tachycardia: Secondary | ICD-10-CM | POA: Diagnosis not present

## 2017-05-06 DIAGNOSIS — Z7189 Other specified counseling: Secondary | ICD-10-CM | POA: Diagnosis not present

## 2017-05-06 DIAGNOSIS — K651 Peritoneal abscess: Secondary | ICD-10-CM | POA: Diagnosis not present

## 2017-05-06 DIAGNOSIS — I480 Paroxysmal atrial fibrillation: Secondary | ICD-10-CM | POA: Diagnosis not present

## 2017-05-06 DIAGNOSIS — T8144XA Sepsis following a procedure, initial encounter: Secondary | ICD-10-CM | POA: Diagnosis not present

## 2017-05-06 DIAGNOSIS — K632 Fistula of intestine: Secondary | ICD-10-CM | POA: Diagnosis not present

## 2017-05-06 DIAGNOSIS — Z6827 Body mass index (BMI) 27.0-27.9, adult: Secondary | ICD-10-CM | POA: Diagnosis not present

## 2017-05-06 DIAGNOSIS — J449 Chronic obstructive pulmonary disease, unspecified: Secondary | ICD-10-CM | POA: Diagnosis present

## 2017-05-06 DIAGNOSIS — E43 Unspecified severe protein-calorie malnutrition: Secondary | ICD-10-CM | POA: Diagnosis not present

## 2017-05-06 DIAGNOSIS — G9341 Metabolic encephalopathy: Secondary | ICD-10-CM | POA: Diagnosis not present

## 2017-05-06 DIAGNOSIS — T17890A Other foreign object in other parts of respiratory tract causing asphyxiation, initial encounter: Secondary | ICD-10-CM | POA: Diagnosis not present

## 2017-05-06 DIAGNOSIS — E872 Acidosis: Secondary | ICD-10-CM | POA: Diagnosis not present

## 2017-05-06 DIAGNOSIS — Z7401 Bed confinement status: Secondary | ICD-10-CM | POA: Diagnosis not present

## 2017-05-06 DIAGNOSIS — R0902 Hypoxemia: Secondary | ICD-10-CM | POA: Diagnosis not present

## 2017-05-06 DIAGNOSIS — K922 Gastrointestinal hemorrhage, unspecified: Secondary | ICD-10-CM | POA: Diagnosis not present

## 2017-05-06 DIAGNOSIS — R933 Abnormal findings on diagnostic imaging of other parts of digestive tract: Secondary | ICD-10-CM

## 2017-05-06 DIAGNOSIS — K5730 Diverticulosis of large intestine without perforation or abscess without bleeding: Secondary | ICD-10-CM | POA: Diagnosis present

## 2017-05-06 DIAGNOSIS — Z87891 Personal history of nicotine dependence: Secondary | ICD-10-CM

## 2017-05-06 DIAGNOSIS — Z789 Other specified health status: Secondary | ICD-10-CM

## 2017-05-06 DIAGNOSIS — A419 Sepsis, unspecified organism: Secondary | ICD-10-CM | POA: Diagnosis not present

## 2017-05-06 DIAGNOSIS — E871 Hypo-osmolality and hyponatremia: Secondary | ICD-10-CM | POA: Diagnosis not present

## 2017-05-06 DIAGNOSIS — J9690 Respiratory failure, unspecified, unspecified whether with hypoxia or hypercapnia: Secondary | ICD-10-CM | POA: Diagnosis not present

## 2017-05-06 DIAGNOSIS — Z95828 Presence of other vascular implants and grafts: Secondary | ICD-10-CM | POA: Diagnosis not present

## 2017-05-06 DIAGNOSIS — K659 Peritonitis, unspecified: Secondary | ICD-10-CM | POA: Diagnosis not present

## 2017-05-06 DIAGNOSIS — Z9911 Dependence on respirator [ventilator] status: Secondary | ICD-10-CM | POA: Diagnosis not present

## 2017-05-06 DIAGNOSIS — I252 Old myocardial infarction: Secondary | ICD-10-CM

## 2017-05-06 DIAGNOSIS — K429 Umbilical hernia without obstruction or gangrene: Secondary | ICD-10-CM | POA: Diagnosis not present

## 2017-05-06 DIAGNOSIS — C187 Malignant neoplasm of sigmoid colon: Secondary | ICD-10-CM

## 2017-05-06 DIAGNOSIS — E2740 Unspecified adrenocortical insufficiency: Secondary | ICD-10-CM | POA: Diagnosis not present

## 2017-05-06 DIAGNOSIS — J9811 Atelectasis: Secondary | ICD-10-CM | POA: Diagnosis not present

## 2017-05-06 DIAGNOSIS — R109 Unspecified abdominal pain: Secondary | ICD-10-CM | POA: Diagnosis present

## 2017-05-06 DIAGNOSIS — Z01818 Encounter for other preprocedural examination: Secondary | ICD-10-CM

## 2017-05-06 DIAGNOSIS — I4581 Long QT syndrome: Secondary | ICD-10-CM | POA: Diagnosis not present

## 2017-05-06 DIAGNOSIS — E669 Obesity, unspecified: Secondary | ICD-10-CM | POA: Diagnosis not present

## 2017-05-06 DIAGNOSIS — R1310 Dysphagia, unspecified: Secondary | ICD-10-CM | POA: Diagnosis not present

## 2017-05-06 DIAGNOSIS — I2510 Atherosclerotic heart disease of native coronary artery without angina pectoris: Secondary | ICD-10-CM

## 2017-05-06 DIAGNOSIS — J189 Pneumonia, unspecified organism: Secondary | ICD-10-CM | POA: Diagnosis not present

## 2017-05-06 DIAGNOSIS — Z85030 Personal history of malignant carcinoid tumor of large intestine: Secondary | ICD-10-CM | POA: Diagnosis not present

## 2017-05-06 DIAGNOSIS — Z7984 Long term (current) use of oral hypoglycemic drugs: Secondary | ICD-10-CM

## 2017-05-06 DIAGNOSIS — E1122 Type 2 diabetes mellitus with diabetic chronic kidney disease: Secondary | ICD-10-CM | POA: Diagnosis present

## 2017-05-06 DIAGNOSIS — A498 Other bacterial infections of unspecified site: Secondary | ICD-10-CM

## 2017-05-06 DIAGNOSIS — T8132XA Disruption of internal operation (surgical) wound, not elsewhere classified, initial encounter: Secondary | ICD-10-CM | POA: Diagnosis present

## 2017-05-06 DIAGNOSIS — N189 Chronic kidney disease, unspecified: Secondary | ICD-10-CM | POA: Diagnosis not present

## 2017-05-06 DIAGNOSIS — Z85048 Personal history of other malignant neoplasm of rectum, rectosigmoid junction, and anus: Secondary | ICD-10-CM | POA: Diagnosis not present

## 2017-05-06 DIAGNOSIS — K9413 Enterostomy malfunction: Secondary | ICD-10-CM | POA: Diagnosis not present

## 2017-05-06 DIAGNOSIS — M6281 Muscle weakness (generalized): Secondary | ICD-10-CM | POA: Diagnosis not present

## 2017-05-06 DIAGNOSIS — E119 Type 2 diabetes mellitus without complications: Secondary | ICD-10-CM

## 2017-05-06 DIAGNOSIS — R05 Cough: Secondary | ICD-10-CM | POA: Diagnosis not present

## 2017-05-06 DIAGNOSIS — J810 Acute pulmonary edema: Secondary | ICD-10-CM | POA: Diagnosis not present

## 2017-05-06 DIAGNOSIS — E785 Hyperlipidemia, unspecified: Secondary | ICD-10-CM | POA: Diagnosis not present

## 2017-05-06 DIAGNOSIS — Z932 Ileostomy status: Secondary | ICD-10-CM | POA: Diagnosis not present

## 2017-05-06 DIAGNOSIS — R188 Other ascites: Secondary | ICD-10-CM

## 2017-05-06 DIAGNOSIS — D490 Neoplasm of unspecified behavior of digestive system: Secondary | ICD-10-CM | POA: Diagnosis not present

## 2017-05-06 DIAGNOSIS — Z452 Encounter for adjustment and management of vascular access device: Secondary | ICD-10-CM | POA: Diagnosis not present

## 2017-05-06 DIAGNOSIS — K567 Ileus, unspecified: Secondary | ICD-10-CM | POA: Diagnosis not present

## 2017-05-06 DIAGNOSIS — R928 Other abnormal and inconclusive findings on diagnostic imaging of breast: Secondary | ICD-10-CM | POA: Diagnosis not present

## 2017-05-06 DIAGNOSIS — K661 Hemoperitoneum: Secondary | ICD-10-CM | POA: Diagnosis not present

## 2017-05-06 DIAGNOSIS — J690 Pneumonitis due to inhalation of food and vomit: Secondary | ICD-10-CM | POA: Diagnosis not present

## 2017-05-06 DIAGNOSIS — E46 Unspecified protein-calorie malnutrition: Secondary | ICD-10-CM | POA: Diagnosis not present

## 2017-05-06 DIAGNOSIS — R1084 Generalized abdominal pain: Secondary | ICD-10-CM | POA: Diagnosis not present

## 2017-05-06 DIAGNOSIS — E870 Hyperosmolality and hypernatremia: Secondary | ICD-10-CM | POA: Diagnosis not present

## 2017-05-06 DIAGNOSIS — E876 Hypokalemia: Secondary | ICD-10-CM

## 2017-05-06 DIAGNOSIS — R739 Hyperglycemia, unspecified: Secondary | ICD-10-CM | POA: Diagnosis not present

## 2017-05-06 DIAGNOSIS — I219 Acute myocardial infarction, unspecified: Secondary | ICD-10-CM | POA: Diagnosis not present

## 2017-05-06 DIAGNOSIS — B351 Tinea unguium: Secondary | ICD-10-CM | POA: Diagnosis not present

## 2017-05-06 DIAGNOSIS — L89152 Pressure ulcer of sacral region, stage 2: Secondary | ICD-10-CM | POA: Diagnosis not present

## 2017-05-06 DIAGNOSIS — K625 Hemorrhage of anus and rectum: Secondary | ICD-10-CM | POA: Diagnosis not present

## 2017-05-06 DIAGNOSIS — Z79899 Other long term (current) drug therapy: Secondary | ICD-10-CM

## 2017-05-06 DIAGNOSIS — R58 Hemorrhage, not elsewhere classified: Secondary | ICD-10-CM | POA: Diagnosis not present

## 2017-05-06 DIAGNOSIS — K8020 Calculus of gallbladder without cholecystitis without obstruction: Secondary | ICD-10-CM | POA: Diagnosis not present

## 2017-05-06 DIAGNOSIS — Z8719 Personal history of other diseases of the digestive system: Secondary | ICD-10-CM | POA: Diagnosis not present

## 2017-05-06 DIAGNOSIS — C189 Malignant neoplasm of colon, unspecified: Secondary | ICD-10-CM | POA: Diagnosis not present

## 2017-05-06 DIAGNOSIS — B965 Pseudomonas (aeruginosa) (mallei) (pseudomallei) as the cause of diseases classified elsewhere: Secondary | ICD-10-CM | POA: Diagnosis not present

## 2017-05-06 DIAGNOSIS — R001 Bradycardia, unspecified: Secondary | ICD-10-CM | POA: Diagnosis not present

## 2017-05-06 DIAGNOSIS — Z5181 Encounter for therapeutic drug level monitoring: Secondary | ICD-10-CM | POA: Diagnosis not present

## 2017-05-06 DIAGNOSIS — D62 Acute posthemorrhagic anemia: Secondary | ICD-10-CM | POA: Diagnosis not present

## 2017-05-06 DIAGNOSIS — E8770 Fluid overload, unspecified: Secondary | ICD-10-CM | POA: Diagnosis not present

## 2017-05-06 DIAGNOSIS — E860 Dehydration: Secondary | ICD-10-CM | POA: Diagnosis not present

## 2017-05-06 DIAGNOSIS — R509 Fever, unspecified: Secondary | ICD-10-CM | POA: Diagnosis not present

## 2017-05-06 DIAGNOSIS — G9340 Encephalopathy, unspecified: Secondary | ICD-10-CM | POA: Diagnosis not present

## 2017-05-06 DIAGNOSIS — J9600 Acute respiratory failure, unspecified whether with hypoxia or hypercapnia: Secondary | ICD-10-CM

## 2017-05-06 DIAGNOSIS — R402414 Glasgow coma scale score 13-15, 24 hours or more after hospital admission: Secondary | ICD-10-CM | POA: Diagnosis not present

## 2017-05-06 DIAGNOSIS — R634 Abnormal weight loss: Secondary | ICD-10-CM | POA: Diagnosis present

## 2017-05-06 DIAGNOSIS — J90 Pleural effusion, not elsewhere classified: Secondary | ICD-10-CM | POA: Diagnosis not present

## 2017-05-06 DIAGNOSIS — Z7982 Long term (current) use of aspirin: Secondary | ICD-10-CM

## 2017-05-06 DIAGNOSIS — L8990 Pressure ulcer of unspecified site, unspecified stage: Secondary | ICD-10-CM

## 2017-05-06 DIAGNOSIS — D500 Iron deficiency anemia secondary to blood loss (chronic): Secondary | ICD-10-CM

## 2017-05-06 DIAGNOSIS — Z4589 Encounter for adjustment and management of other implanted devices: Secondary | ICD-10-CM | POA: Diagnosis not present

## 2017-05-06 DIAGNOSIS — D72829 Elevated white blood cell count, unspecified: Secondary | ICD-10-CM | POA: Diagnosis not present

## 2017-05-06 DIAGNOSIS — E1151 Type 2 diabetes mellitus with diabetic peripheral angiopathy without gangrene: Secondary | ICD-10-CM | POA: Diagnosis not present

## 2017-05-06 DIAGNOSIS — T8143XA Infection following a procedure, organ and space surgical site, initial encounter: Secondary | ICD-10-CM | POA: Diagnosis not present

## 2017-05-06 LAB — RETICULOCYTES
RBC.: 3.36 MIL/uL — ABNORMAL LOW (ref 4.22–5.81)
RETIC COUNT ABSOLUTE: 63.8 10*3/uL (ref 19.0–186.0)
RETIC CT PCT: 1.9 % (ref 0.4–3.1)

## 2017-05-06 LAB — COMPREHENSIVE METABOLIC PANEL
ALT: 9 U/L — AB (ref 17–63)
AST: 19 U/L (ref 15–41)
Albumin: 2.4 g/dL — ABNORMAL LOW (ref 3.5–5.0)
Alkaline Phosphatase: 50 U/L (ref 38–126)
Anion gap: 13 (ref 5–15)
BILIRUBIN TOTAL: 0.8 mg/dL (ref 0.3–1.2)
BUN: 5 mg/dL — ABNORMAL LOW (ref 6–20)
CALCIUM: 8.2 mg/dL — AB (ref 8.9–10.3)
CO2: 24 mmol/L (ref 22–32)
Chloride: 103 mmol/L (ref 101–111)
Creatinine, Ser: 1.17 mg/dL (ref 0.61–1.24)
GFR calc non Af Amer: >60 mL/min (ref >60)
Glucose, Bld: 110 mg/dL — ABNORMAL HIGH (ref 65–99)
Potassium: 2.7 mmol/L — CL (ref 3.5–5.1)
Sodium: 140 mmol/L (ref 135–145)
TOTAL PROTEIN: 7.4 g/dL (ref 6.5–8.1)

## 2017-05-06 LAB — URINALYSIS, ROUTINE W REFLEX MICROSCOPIC
BILIRUBIN URINE: NEGATIVE
Glucose, UA: NEGATIVE mg/dL
Hgb urine dipstick: NEGATIVE
KETONES UR: NEGATIVE mg/dL
Nitrite: NEGATIVE
Protein, ur: NEGATIVE mg/dL
Specific Gravity, Urine: 1.035 — ABNORMAL HIGH (ref 1.005–1.030)
pH: 6 (ref 5.0–8.0)

## 2017-05-06 LAB — CBC
HEMATOCRIT: 22.8 % — AB (ref 39.0–52.0)
HEMATOCRIT: 25.8 % — AB (ref 39.0–52.0)
HEMOGLOBIN: 7 g/dL — AB (ref 13.0–17.0)
Hemoglobin: 5.8 g/dL — CL (ref 13.0–17.0)
MCH: 17.8 pg — ABNORMAL LOW (ref 26.0–34.0)
MCH: 19.3 pg — AB (ref 26.0–34.0)
MCHC: 25.4 g/dL — ABNORMAL LOW (ref 30.0–36.0)
MCHC: 27.1 g/dL — AB (ref 30.0–36.0)
MCV: 69.9 fL — AB (ref 78.0–100.0)
MCV: 71.1 fL — AB (ref 78.0–100.0)
Platelets: 439 10*3/uL — ABNORMAL HIGH (ref 150–400)
Platelets: 414 10*3/uL — ABNORMAL HIGH (ref 150–400)
RBC: 3.26 MIL/uL — AB (ref 4.22–5.81)
RBC: 3.63 MIL/uL — ABNORMAL LOW (ref 4.22–5.81)
RDW: 22.3 % — ABNORMAL HIGH (ref 11.5–15.5)
RDW: 21.6 % — ABNORMAL HIGH (ref 11.5–15.5)
WBC: 9.8 10*3/uL (ref 4.0–10.5)
WBC: 10.8 10*3/uL — ABNORMAL HIGH (ref 4.0–10.5)

## 2017-05-06 LAB — IRON AND TIBC
Iron: 20 ug/dL — ABNORMAL LOW (ref 45–182)
Saturation Ratios: 6 % — ABNORMAL LOW (ref 17.9–39.5)
TIBC: 325 ug/dL (ref 250–450)
UIBC: 305 ug/dL

## 2017-05-06 LAB — I-STAT TROPONIN, ED: TROPONIN I, POC: 0.01 ng/mL (ref 0.00–0.08)

## 2017-05-06 LAB — FERRITIN: FERRITIN: 7 ng/mL — AB (ref 24–336)

## 2017-05-06 LAB — POC OCCULT BLOOD, ED: Fecal Occult Bld: POSITIVE — AB

## 2017-05-06 LAB — PREPARE RBC (CROSSMATCH)

## 2017-05-06 LAB — HEMOGLOBIN A1C
Hgb A1c MFr Bld: 4.9 % (ref 4.8–5.6)
MEAN PLASMA GLUCOSE: 93.93 mg/dL

## 2017-05-06 LAB — LIPASE, BLOOD: LIPASE: 22 U/L (ref 11–51)

## 2017-05-06 LAB — FOLATE: Folate: 7.1 ng/mL (ref >5.9)

## 2017-05-06 LAB — VITAMIN B12: Vitamin B-12: 97 pg/mL — ABNORMAL LOW (ref 180–914)

## 2017-05-06 MED ORDER — SODIUM CHLORIDE 0.9 % IV SOLN
10.0000 mL/h | Freq: Once | INTRAVENOUS | Status: AC
  Administered 2017-05-06: 10 mL/h via INTRAVENOUS

## 2017-05-06 MED ORDER — POTASSIUM CHLORIDE CRYS ER 20 MEQ PO TBCR
40.0000 meq | EXTENDED_RELEASE_TABLET | Freq: Once | ORAL | Status: AC
  Administered 2017-05-06: 40 meq via ORAL
  Filled 2017-05-06: qty 2

## 2017-05-06 MED ORDER — SODIUM CHLORIDE 0.9 % IV SOLN
INTRAVENOUS | Status: DC
  Administered 2017-05-06: 10 mL/h via INTRAVENOUS
  Administered 2017-05-07 – 2017-05-11 (×2): via INTRAVENOUS

## 2017-05-06 MED ORDER — PANTOPRAZOLE SODIUM 40 MG PO TBEC
40.0000 mg | DELAYED_RELEASE_TABLET | Freq: Every day | ORAL | Status: DC
  Administered 2017-05-07 – 2017-05-18 (×12): 40 mg via ORAL
  Filled 2017-05-06 (×13): qty 1

## 2017-05-06 MED ORDER — IOPAMIDOL (ISOVUE-370) INJECTION 76%
INTRAVENOUS | Status: AC
  Administered 2017-05-06: 100 mL
  Filled 2017-05-06: qty 100

## 2017-05-06 MED ORDER — INSULIN ASPART 100 UNIT/ML ~~LOC~~ SOLN
0.0000 [IU] | Freq: Three times a day (TID) | SUBCUTANEOUS | Status: DC
  Administered 2017-05-07: 2 [IU] via SUBCUTANEOUS
  Administered 2017-05-10 – 2017-05-13 (×5): 1 [IU] via SUBCUTANEOUS
  Administered 2017-05-19: 3 [IU] via SUBCUTANEOUS
  Administered 2017-05-19: 5 [IU] via SUBCUTANEOUS

## 2017-05-06 MED ORDER — METOPROLOL TARTRATE 25 MG PO TABS
25.0000 mg | ORAL_TABLET | Freq: Two times a day (BID) | ORAL | Status: DC
  Administered 2017-05-06 – 2017-05-16 (×19): 25 mg via ORAL
  Filled 2017-05-06 (×21): qty 1

## 2017-05-06 MED ORDER — POTASSIUM CHLORIDE 10 MEQ/100ML IV SOLN
10.0000 meq | Freq: Once | INTRAVENOUS | Status: AC
  Administered 2017-05-06: 10 meq via INTRAVENOUS
  Filled 2017-05-06: qty 100

## 2017-05-06 NOTE — ED Triage Notes (Signed)
Patient here with several weeks of intermittent abdominal pain with diarrhea. minimal pain on arrival, no nausea no vomiting. Complains of weakness with same

## 2017-05-06 NOTE — ED Notes (Signed)
Patient transported to CT 

## 2017-05-06 NOTE — Progress Notes (Signed)
Asked to see patient by ER team when unassigned team said my partner Dr. Penelope Coop had proceeded with colonoscopy in the past but that is not a true statement and I could not find evidence of a colonoscopy in our office computer chart nor on the hospital computer chart so please call back the unassigned team to assume GI care of this patient

## 2017-05-06 NOTE — H&P (Signed)
Jesse Le is an 72 y.o. male.   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 Denies any NSAIDs abuse states has been taking one aspirin every day patient was noted to have hypochromic microcytic anemia with hemoglobin of 5.6. CT angio. Showed no evidence of neurovascularly but showed 9 x 5 cm soft tissue opacity concerning for colonic carcinoma. Patient denies any fever or chills. Denies any GI workup in the past denies any colonoscopy in the past. Patient receiving packed RBCs GI consultation has been called by ED.  Past Medical History:  Diagnosis Date  . Aneurysm of aorta (HCC)   . Coronary artery disease   . Diabetes mellitus without complication ([place])    NEW ONSET 09/2012  . Hypertension   . Myocardial infarct [facility])     Past Surgical History:  Procedure Laterality Date  . ABDOMINAL AORTIC ANEURYSM REPAIR  10-14-11   TEVAR  . INTRAVASCULAR ULTRASOUND  10/14/2011   Procedure: INTRAVASCULAR ULTRASOUND;  Surgeon: Serafina Mitchell, MD;  Location: [place];  Service: Vascular;  Laterality: N/A;  . VIDEO BRONCHOSCOPY  10/14/2011   Procedure: VIDEO BRONCHOSCOPY;  Surgeon: Serafina Mitchell, MD;  Location: [facility] OR;  Service: Vascular;  Laterality: N/A;    No family history on file. Social History:  reports that he quit smoking about 5 years ago. His smoking use included  cigarettes. He smoked 0.50 packs per day. He has never used smokeless tobacco. He reports that he drinks about 0.6 oz of alcohol per week. He reports that he does not use drugs.  Allergies: No Known Allergies   (Not in a hospital admission)  Results for orders placed or performed during the hospital encounter of 05/06/17 (from the past 48 hour(s))  Lipase, blood     Status: None   Collection Time: 05/06/17 10:36 AM  Result Value Ref Range   Lipase 22 11 - 51 U/L    Comment: Performed at [facility], [address] [place], [place] [address]  Comprehensive metabolic panel     Status: Abnormal   Collection Time: 05/06/17 10:36 AM  Result Value Ref Range   Sodium 140 135 - 145 mmol/L   Potassium 2.7 (LL) 3.5 - 5.1 mmol/L    Comment: CRITICAL RESULT CALLED TO, READ BACK BY AND VERIFIED WITH: L.BERDIK,RN 1127 05/06/17 CLARK,S    Chloride 103 101 - 111 mmol/L   CO2 24 22 - 32 mmol/L   Glucose, Bld 110 (H) 65 - 99 mg/dL   BUN 5 (L) 6 - 20 mg/dL   Creatinine, Ser 1.17 0.61 - 1.24 mg/dL   Calcium 8.2 (L) 8.9 - 10.3 mg/dL   Total Protein 7.4 6.5 - 8.1 g/dL   Albumin 2.4 (L) 3.5 - 5.0 g/dL   AST 19 15 - 41 U/L   ALT 9 (L) 17 - 63 U/L   Alkaline Phosphatase 50 38 - 126 U/L   Total Bilirubin 0.8 0.3 - 1.2 mg/dL   GFR calc non Af Amer >60 >60  mL/min   GFR calc Af Amer >60 >60 mL/min    Comment: (NOTE) The eGFR has been calculated using the CKD EPI equation. This calculation has not been validated in all clinical situations. eGFR's persistently <60 mL/min signify possible Chronic Kidney Disease.    Anion gap 13 5 - 15    Comment: Performed at [facility] [address] [place] [address]  CBC     Status: Abnormal   Collection Time: 05/06/17 10:36 AM  Result Value Ref Range   WBC 9.8 4.0 - 10.5 K/uL   RBC 3.26 (L) 4.22 - 5.81 MIL/uL   Hemoglobin 5.8 (LL) 13.0 - 17.0 g/dL    Comment: REPEATED TO VERIFY CRITICAL RESULT CALLED TO, READ BACK BY AND VERIFIED  WITH: LORI BERDIK,RN AT 1129 05/06/17 BY ZBEECH.    HCT 22.8 (L) 39.0 - 52.0 %   MCV 69.9 (L) 78.0 - 100.0 fL   MCH 17.8 (L) 26.0 - 34.0 pg   MCHC 25.4 (L) 30.0 - 36.0 g/dL   RDW 22.3 (H) 11.5 - 15.5 %   Platelets 439 (H) 150 - 400 K/uL    Comment: Performed at [facility] [address] [place], [place] [address]  I-stat troponin, ED     Status: None   Collection Time: 05/06/17 11:40 AM  Result Value Ref Range   Troponin i, poc 0.01 0.00 - 0.08 ng/mL   Comment 3            Comment: Due to the release kinetics of cTnI, a negative result within the first hours of the onset of symptoms does not rule out myocardial infarction with certainty. If myocardial infarction is still suspected, repeat the test at appropriate intervals.   Type and screen     Status: None (Preliminary result)   Collection Time: 05/06/17 12:00 PM  Result Value Ref Range   ABO/RH(D) B POS    Antibody Screen NEG    Sample Expiration 05/09/2017    Unit Number [id number]    Blood Component Type RED CELLS,LR    Unit division 00    Status of Unit ISSUED    Transfusion Status OK TO TRANSFUSE    Crossmatch Result      Compatible Performed at [facility], [place] [address], [place], [place] [address]   Vitamin B12     Status: Abnormal   Collection Time: 05/06/17 12:10 PM  Result Value Ref Range   Vitamin B-12 97 (L) 180 - 914 pg/mL    Comment: (NOTE) This assay is not validated for testing neonatal or myeloproliferative syndrome specimens for Vitamin B12 levels. Performed at [facility], [place] [address] [place], [place] [address]   Folate     Status: None   Collection Time: 05/06/17 12:10 PM  Result Value Ref Range   Folate 7.1 >5.9 ng/mL    Comment: Performed at [facility] [address] [place], [place] [address]  Iron and TIBC     Status: Abnormal   Collection Time: 05/06/17 12:10 PM  Result Value Ref Range   Iron 20 (L) 45 - 182 ug/dL   TIBC 325 250 - 450 ug/dL    Saturation Ratios 6 (L) 17.9 - 39.5 %   UIBC 305 ug/dL    Comment: Performed at [facility], [place] [address], [place], [place] [address]  Ferritin     Status: Abnormal   Collection Time: 05/06/17 12:10 PM  Result  Value Ref Range   Ferritin 7 (L) 24 - 336 ng/mL    Comment: Performed at [facility], [place] [address], [place], [place] [address]  Reticulocytes     Status: Abnormal   Collection Time: 05/06/17 12:10 PM  Result Value Ref Range   Retic Ct Pct 1.9 0.4 - 3.1 %   RBC. 3.36 (L) 4.22 - 5.81 MIL/uL   Retic Count, Absolute 63.8 19.0 - 186.0 K/uL    Comment: Performed at [facility] [address], [place], [place] [address]  POC occult blood, ED Provider will collect     Status: Abnormal   Collection Time: 05/06/17 12:33 PM  Result Value Ref Range   Fecal Occult Bld POSITIVE (A) NEGATIVE  Prepare RBC     Status: None   Collection Time: 05/06/17 12:33 PM  Result Value Ref Range   Order Confirmation      ORDER PROCESSED BY BLOOD BANK Performed at [facility], [place] [address] [place], [place] [address]    Ct Angio Chest/abd/pel For Dissection W And/or Wo Contrast  Result Date: 05/06/2017 CLINICAL DATA:  History of thoracic aortic aneurysm and dissection. Gastrointestinal bleeding. EXAM: CT ANGIOGRAPHY CHEST, ABDOMEN AND PELVIS TECHNIQUE: Multidetector CT imaging through the chest, abdomen and pelvis was performed using the standard protocol during bolus administration of intravenous contrast. Multiplanar reconstructed images and MIPs were obtained and reviewed to evaluate the vascular anatomy. CONTRAST:  151m ISOVUE-370 IOPAMIDOL (ISOVUE-370) INJECTION 76% COMPARISON:  CT scans of November 22, 2011 and October 12, 2011. FINDINGS: CTA CHEST FINDINGS Cardiovascular: 4.1 cm ascending thoracic aortic aneurysm is noted. Great vessels are widely patent without significant stenosis. Status post stent graft placement beginning in transverse aortic arch distal to the  origin of left subclavian artery, that extends through 7.5 cm distal descending thoracic aortic aneurysm into abdominal aorta. Excluded aneurysmal sac measures 9 cm. Normal cardiac size. No pericardial effusion is noted. Mediastinum/Nodes: No enlarged mediastinal, hilar, or axillary lymph nodes. Thyroid gland, trachea, and esophagus demonstrate no significant findings. Lungs/Pleura: No pneumothorax or pleural effusion is noted. Mild emphysematous disease is noted in the upper lobes bilaterally. Minimal left lower lobe subsegmental atelectasis is noted. 4 mm nodule is noted in left lower lobe best seen on image number 80 of series 7. Musculoskeletal: No chest wall abnormality. No acute or significant osseous findings. Review of the MIP images confirms the above findings. CTA ABDOMEN AND PELVIS FINDINGS VASCULAR Aorta: Status post stent graft repair of abdominal aortic aneurysm. No endoleak is noted. Graft and limbs are widely patent. Excluded aneurysmal sac of infrarenal abdominal aorta has maximum measured transverse diameter of 4.5 cm. Celiac: Stent is noted in proximal portion which is widely patent. SMA: Stent is noted in proximal portion which is widely patent. Renals: Stent is noted in proximal portion of right renal artery which appears to be patent. 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 IMA: Occluded proximally, with reconstitution of its more distal portion through collaterals. Inflow: Stent graft extends into right common iliac artery which is widely patent. Stent graft extends into left external iliac artery, with separate stent extending into left internal iliac artery. These are patent. 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 Small focal dissection is noted in proximal portion of left external iliac artery. Veins: No obvious venous abnormality within the limitations of this arterial phase study. Review of the MIP images confirms the  above findings.  NON-VASCULAR Hepatobiliary: Cholelithiasis is noted without inflammation. No definite liver abnormality is noted. No biliary dilatation is noted. Pancreas: Unremarkable. No pancreatic ductal dilatation or surrounding inflammatory changes. Spleen: Normal in size without focal abnormality. Adrenals/Urinary Tract: Adrenal glands are unremarkable. Stable exophytic cyst is seen arising from midpole of right kidney. 2.3 cm low density is noted in lower pole of right kidney which is increased in size compared to prior exam. It demonstrates average Hounsfield measurement of 23. Severe left renal atrophy is noted with associated left renal cyst. No hydronephrosis or renal obstruction is noted. Urinary bladder is unremarkable. Stomach/Bowel: The stomach appears normal. The appendix appears normal. Diverticulosis of descending colon is noted. Large eccentric soft tissue abnormality is seen involving the sigmoid colon which measures 9 x 5 cm. This is concerning for neoplasm or malignancy, although diverticulitis cannot be excluded. Lymphatic: No significant adenopathy is noted. Reproductive: Prostate is unremarkable. Other: Moderate size fat containing periumbilical hernia is noted. Musculoskeletal: No acute or significant osseous findings. Review of the MIP images confirms the above findings. IMPRESSION: 9 x 5 cm eccentric soft tissue abnormality seen involving the sigmoid colon concerning for neoplasm or malignancy, although diverticulitis cannot be excluded. Sigmoidoscopy is recommended for further evaluation. 2.3 cm low density is noted in lower pole of right kidney which is increased in size compared to prior exam. Further evaluation with ultrasound is recommended evaluate for cyst versus neoplasm. 4.1 cm ascending thoracic aortic aneurysm. Status post stent graft placement involving thoracic aortic arch and extending through descending thoracic aorta into abdominal aorta. The graft is widely patent without  evidence of endoleak. Patent stents are seen in both iliac arteries. 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 Small focal dissection is noted in proximal left external iliac artery. 7.5 cm excluded aneurysmal sac is noted in distal descending thoracic aorta without evidence of endoleak. 4 mm nodule seen in left lower lobe. No follow-up needed if patient is low-risk. Non-contrast chest CT can be considered in 12 months if patient is high-risk. This recommendation follows the consensus statement: Guidelines for Management of Incidental Pulmonary Nodules Detected on CT Images: From the Fleischner Society 2017; Radiology 2017; 284:228-243. Excluded aneurysmal sac infrarenal abdominal aorta has maximum measured diameter 4.5 cm. Patent stents are noted in the celiac, superior mesenteric and right renal arteries. 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 Moderate size fat containing periumbilical hernia. Aortic Atherosclerosis (ICD10-I70.0) and Emphysema (ICD10-J43.9). Electronically Signed   By: Marijo Conception, M.D.   On: 05/06/2017 14:23    Review of Systems  Constitutional: Positive for malaise/fatigue and weight loss. Negative for chills, diaphoresis and fever.  HENT: Negative for hearing loss.   Eyes: Negative for blurred vision.  Respiratory: Negative for cough and shortness of breath.   Cardiovascular: Negative for chest pain, palpitations, orthopnea and claudication.  Gastrointestinal: Positive for abdominal pain and blood in stool.  Genitourinary: Negative for dysuria.  Neurological: Negative for dizziness.    Blood pressure (!) 157/63, pulse 86, temperature 99.9 F (37.7 C), temperature source Oral, resp. rate 13, SpO2 100 %. Physical Exam  Constitutional: He is oriented to person, place, and time.  HENT:  Head: Normocephalic and atraumatic.  Eyes: Pupils are  equal, round, and reactive to light. Left eye exhibits no discharge.  Conjunctiva pale  Neck: Normal range of motion. Neck supple. No JVD present. No tracheal deviation present. No  thyromegaly present.  Cardiovascular: Normal rate and regular rhythm.  Murmur (Soft systolic murmur noted) heard. Respiratory: Effort normal and breath sounds normal. No respiratory distress. He has no wheezes. He has no rales.  GI: Soft. Bowel sounds are normal.  Mild generalized tenderness noted umbilical hernia noted no guarding or rebound  Musculoskeletal: He exhibits no edema, tenderness or deformity.  Neurological: He is alert and oriented to person, place, and time.     Assessment/Plan Generalized abdominal pain associated with marked hypochromic microcytic anemia with questionable sigmoid tumor rule out colonic carcinoma History of thoracic aneurysm dissection status post endograft And infrarenal dissection status post fenestrated endovascular graft Hypertension Diabetes mellitus COPD Hypokalemia Obesity Chronic kidney disease stage II Plan Agree with packed RBCs blood transfusion Replace K GI consultation has been called Check serial H&H Charolette Forward, MD 05/06/2017, 3:42 PM

## 2017-05-06 NOTE — ED Provider Notes (Signed)
72 year old male with history of diabetes, cardiac disease, aortic aneurysm, hypertension presented to ED complaining of abdominal pain and weakness.  Patient report recurrent chronic generalized weakness ongoing for the past 2 months as well as having intermittent dark stools recurrent diarrhea for the same duration.  Report decrease in appetite, not eating much, spent most of his time in bed.  Denies recent alcohol use but admits to using alcohol in the past.  No complaints of chest pain or shortness of breath.  Does endorse dizziness and lightheadedness.  On exam, this is  an elderly male, disheveled in appearance, ill-appearing.  Heart with normal rate and rhythm, lungs are clear to auscultation bilaterally, abdomen is soft and nontender.  Reducible ventral hernia.  Chaperone present for rectal examination, dark-colored stool noted on glove, no frank blood, no stool impaction, no obvious rectal mass.  Rectal tone normal.  Currently his labs remarkable for potassium of 2.7, will obtain EKG, and will replenish with potassium  supplementation.  This is likely secondary to his decreased oral intake.  He has a hemoglobin of 5.8, likely secondary to upper GI bleed.  Anemia will panel order as well as type and screen, patient would likely require blood transfusion.  Patient signed out to Dr. Leonette Monarch who will officially evaluate patient.  Please refer to his full H&P for a complete picture.  BP 139/65   Pulse 81   Temp 98.5 F (36.9 C) (Oral)   Resp 18   SpO2 99%   Results for orders placed or performed during the hospital encounter of 05/06/17  Lipase, blood  Result Value Ref Range   Lipase 22 11 - 51 U/L  Comprehensive metabolic panel  Result Value Ref Range   Sodium 140 135 - 145 mmol/L   Potassium 2.7 (LL) 3.5 - 5.1 mmol/L   Chloride 103 101 - 111 mmol/L   CO2 24 22 - 32 mmol/L   Glucose, Bld 110 (H) 65 - 99 mg/dL   BUN 5 (L) 6 - 20 mg/dL   Creatinine, Ser 1.17 0.61 - 1.24 mg/dL   Calcium  8.2 (L) 8.9 - 10.3 mg/dL   Total Protein 7.4 6.5 - 8.1 g/dL   Albumin 2.4 (L) 3.5 - 5.0 g/dL   AST 19 15 - 41 U/L   ALT 9 (L) 17 - 63 U/L   Alkaline Phosphatase 50 38 - 126 U/L   Total Bilirubin 0.8 0.3 - 1.2 mg/dL   GFR calc non Af Amer >60 >60 mL/min   GFR calc Af Amer >60 >60 mL/min   Anion gap 13 5 - 15  CBC  Result Value Ref Range   WBC 9.8 4.0 - 10.5 K/uL   RBC 3.26 (L) 4.22 - 5.81 MIL/uL   Hemoglobin 5.8 (LL) 13.0 - 17.0 g/dL   HCT 22.8 (L) 39.0 - 52.0 %   MCV 69.9 (L) 78.0 - 100.0 fL   MCH 17.8 (L) 26.0 - 34.0 pg   MCHC 25.4 (L) 30.0 - 36.0 g/dL   RDW 22.3 (H) 11.5 - 15.5 %   Platelets 439 (H) 150 - 400 K/uL  I-stat troponin, ED  Result Value Ref Range   Troponin i, poc 0.01 0.00 - 0.08 ng/mL   Comment 3           No results found.    Domenic Moras, PA-C 05/06/17 1210

## 2017-05-06 NOTE — ED Provider Notes (Signed)
[facility] EMERGENCY DEPARTMENT Provider Note  CSN: [id number] Arrival date & time: 05/06/17 1019  Chief Complaint(s) No chief complaint on file.  HPI Jesse Le is a 72 y.o. 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  Cramping appears to be associated with bowel movements.  No alleviating or aggravating factors.  No associated nausea or vomiting.  Patient does endorse gradually worsening fatigue and dyspnea on exertion.  He denies any chest pain.  Denies any urinary symptoms.  HPI  Past Medical History Past Medical History:  Diagnosis Date  . Aneurysm of aorta (HCC)   . Coronary artery disease   . Diabetes mellitus without complication ([place])    NEW ONSET 09/2012  . Hypertension   . Myocardial infarct [facility])    Patient Active Problem List   Diagnosis Date Noted  . Thoracoabdominal aneurysm ([place]) 11/22/2011  . Aftercare following surgery of the circulatory system, [place] 11/22/2011  . Myocardial infarct ([place])    Home Medication(s) Prior to Admission medications   Medication Sig Start Date End Date Taking? Authorizing Provider  aspirin EC 81 MG tablet Take 81 mg by mouth daily.   Yes [provider]  metFORMIN (GLUCOPHAGE) 500 MG tablet Take 1 tablet (500 mg total) by mouth 2 (two) times daily with a meal. 10/13/12  Yes Charolette Forward, MD  metoprolol tartrate (LOPRESSOR) 50 MG tablet Take 50 mg by mouth 2 (two) times daily.   Yes [provider]  glimepiride (AMARYL) 2 MG tablet Take 1 tablet (2 mg total) by mouth daily with breakfast. Patient not taking: Reported on 05/06/2017 10/13/12   Charolette Forward, MD  metoprolol (LOPRESSOR) 50 MG tablet Take 1 tablet (50 mg total) by  mouth 3 (three) times daily. Patient not taking: Reported on 05/06/2017 10/13/12   Charolette Forward, MD                                                                                                                                    Past Surgical History Past Surgical History:  Procedure Laterality Date  . ABDOMINAL AORTIC ANEURYSM REPAIR  10-14-11   TEVAR  . INTRAVASCULAR ULTRASOUND  10/14/2011   Procedure: INTRAVASCULAR ULTRASOUND;  Surgeon: Serafina Mitchell, MD;  Location: [facility];  Service: Vascular;  Laterality: N/A;  . VIDEO BRONCHOSCOPY  10/14/2011   Procedure: VIDEO BRONCHOSCOPY;  Surgeon: Serafina Mitchell, MD;  [facility];  Service: Vascular;  Laterality: N/A;   Family History No family history on file.  Social History Social History   Tobacco Use  . Smoking status: Former Smoker    Packs/day: 0.50    Types: Cigarettes    Last attempt to quit: 07/20/2011    Years since  quitting: 5.8  . Smokeless tobacco: Never Used  Substance Use Topics  . Alcohol use: Yes    Alcohol/week: 0.6 oz    Types: 1 Shots of liquor per week    Comment: weekends  . Drug use: No   Allergies Patient has no known allergies.  Review of Systems Review of Systems All other systems are reviewed and are negative for acute change except as noted in the HPI  Physical Exam Vital Signs  I have reviewed the triage vital signs BP 139/65   Pulse 81   Temp 98.5 F (36.9 C) (Oral)   Resp 18   SpO2 99%   Physical Exam  Constitutional: He is oriented to person, place, and time. He appears well-developed and well-nourished. No distress.  HENT:  Head: Normocephalic and atraumatic.  Nose: Nose normal.  Eyes: Pupils are equal, round, and reactive to light. EOM are normal. Right eye exhibits no discharge. Left eye exhibits no discharge. No scleral icterus.  Pale conjunctiva   Neck: Normal range of motion. Neck supple.  Cardiovascular: Normal rate and regular rhythm. Exam reveals no gallop and no friction  rub.  No murmur heard. Pulmonary/Chest: Effort normal and breath sounds normal. No stridor. No respiratory distress. He has no rales.  Abdominal: Soft. He exhibits no distension. There is no tenderness. There is no rigidity, no rebound and no guarding. A hernia is present. Hernia confirmed positive in the ventral area.  Musculoskeletal: He exhibits no edema or tenderness.  Neurological: He is alert and oriented to person, place, and time.  Skin: Skin is warm and dry. No rash noted. He is not diaphoretic. No erythema.  Psychiatric: He has a normal mood and affect.  Vitals reviewed.   ED Results and Treatments Labs (all labs ordered are listed, but only abnormal results are displayed) Labs Reviewed  COMPREHENSIVE METABOLIC PANEL - Abnormal; Notable for the following components:      Result Value   Potassium 2.7 (*)    Glucose, Bld 110 (*)    BUN 5 (*)    Calcium 8.2 (*)    Albumin 2.4 (*)    ALT 9 (*)    All other components within normal limits  CBC - Abnormal; Notable for the following components:   RBC 3.26 (*)    Hemoglobin 5.8 (*)    HCT 22.8 (*)    MCV 69.9 (*)    MCH 17.8 (*)    MCHC 25.4 (*)    RDW 22.3 (*)    Platelets 439 (*)    All other components within normal limits  VITAMIN B12 - Abnormal; Notable for the following components:   Vitamin B-12 97 (*)    All other components within normal limits  IRON AND TIBC - Abnormal; Notable for the following components:   Iron 20 (*)    Saturation Ratios 6 (*)    All other components within normal limits  FERRITIN - Abnormal; Notable for the following components:   Ferritin 7 (*)    All other components within normal limits  RETICULOCYTES - Abnormal; Notable for the following components:   RBC. 3.36 (*)    All other components within normal limits  POC OCCULT BLOOD, ED - Abnormal; Notable for the following components:   Fecal Occult Bld POSITIVE (*)    All other components within normal limits  LIPASE, BLOOD  FOLATE    URINALYSIS, ROUTINE W REFLEX MICROSCOPIC  I-STAT TROPONIN, ED  TYPE AND SCREEN  PREPARE RBC (CROSSMATCH)  EKG  EKG Interpretation  Date/Time:  Friday May 06 2017 13:07:45 EDT Ventricular Rate:  86 PR Interval:    QRS Duration: 100 QT Interval:  608 QTC Calculation: 728 R Axis:   14 Text Interpretation:  Sinus rhythm Probable left atrial enlargement Nonspecific repol abnormality, diffuse leads Prolonged QT interval NO STEMI Confirmed by Addison Lank [phone]) on 05/06/2017 1:26:19 PM      Radiology Ct Angio Chest/abd/pel For Dissection W And/or Wo Contrast  Result Date: 05/06/2017 CLINICAL DATA:  History of thoracic aortic aneurysm and dissection. Gastrointestinal bleeding. EXAM: CT ANGIOGRAPHY CHEST, ABDOMEN AND PELVIS TECHNIQUE: Multidetector CT imaging through the chest, abdomen and pelvis was performed using the standard protocol during bolus administration of intravenous contrast. Multiplanar reconstructed images and MIPs were obtained and reviewed to evaluate the vascular anatomy. CONTRAST:  163mL ISOVUE-370 IOPAMIDOL (ISOVUE-370) INJECTION 76% COMPARISON:  CT scans of November 22, 2011 and October 12, 2011. FINDINGS: CTA CHEST FINDINGS Cardiovascular: 4.1 cm ascending thoracic aortic aneurysm is noted. Great vessels are widely patent without significant stenosis. Status post stent graft placement beginning in transverse aortic arch distal to the origin of left subclavian artery, that extends through 7.5 cm distal descending thoracic aortic aneurysm into abdominal aorta. Excluded aneurysmal sac measures 9 cm. Normal cardiac size. No pericardial effusion is noted. Mediastinum/Nodes: No enlarged mediastinal, hilar, or axillary lymph nodes. Thyroid gland, trachea, and esophagus demonstrate no significant findings. Lungs/Pleura: No pneumothorax or pleural effusion is noted.  Mild emphysematous disease is noted in the upper lobes bilaterally. Minimal left lower lobe subsegmental atelectasis is noted. 4 mm nodule is noted in left lower lobe best seen on image number 80 of series 7. Musculoskeletal: No chest wall abnormality. No acute or significant osseous findings. Review of the MIP images confirms the above findings. CTA ABDOMEN AND PELVIS FINDINGS VASCULAR Aorta: Status post stent graft repair of abdominal aortic aneurysm. No endoleak is noted. Graft and limbs are widely patent. Excluded aneurysmal sac of infrarenal abdominal aorta has maximum measured transverse diameter of 4.5 cm. Celiac: Stent is noted in proximal portion which is widely patent. SMA: Stent is noted in proximal portion which is widely patent. Renals: Stent is noted in proximal portion of right renal artery which appears to be patent. 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 IMA: Occluded proximally, with reconstitution of its more distal portion through collaterals. Inflow: Stent graft extends into right common iliac artery which is widely patent. Stent graft extends into left external iliac artery, with separate stent extending into left internal iliac artery. These are patent. 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 Small focal dissection is noted in proximal portion of left external iliac artery. Veins: No obvious venous abnormality within the limitations of this arterial phase study. Review of the MIP images confirms the above findings. NON-VASCULAR Hepatobiliary: Cholelithiasis is noted without inflammation. No definite liver abnormality is noted. No biliary dilatation is noted. Pancreas: Unremarkable. No pancreatic ductal dilatation or surrounding inflammatory changes. Spleen: Normal in size without focal abnormality. Adrenals/Urinary Tract: Adrenal glands are unremarkable. Stable exophytic cyst is seen arising from midpole of right kidney. 2.3  cm low density is noted in lower pole of right kidney which is increased in size compared to prior exam. It demonstrates average Hounsfield measurement of 23. Severe left renal atrophy is noted with associated left renal cyst. No hydronephrosis or renal obstruction is noted. Urinary bladder is unremarkable. Stomach/Bowel: The stomach appears normal. The appendix  appears normal. Diverticulosis of descending colon is noted. Large eccentric soft tissue abnormality is seen involving the sigmoid colon which measures 9 x 5 cm. This is concerning for neoplasm or malignancy, although diverticulitis cannot be excluded. Lymphatic: No significant adenopathy is noted. Reproductive: Prostate is unremarkable. Other: Moderate size fat containing periumbilical hernia is noted. Musculoskeletal: No acute or significant osseous findings. Review of the MIP images confirms the above findings. IMPRESSION: 9 x 5 cm eccentric soft tissue abnormality seen involving the sigmoid colon concerning for neoplasm or malignancy, although diverticulitis cannot be excluded. Sigmoidoscopy is recommended for further evaluation. 2.3 cm low density is noted in lower pole of right kidney which is increased in size compared to prior exam. Further evaluation with ultrasound is recommended evaluate for cyst versus neoplasm. 4.1 cm ascending thoracic aortic aneurysm. Status post stent graft placement involving thoracic aortic arch and extending through descending thoracic aorta into abdominal aorta. The graft is widely patent without evidence of endoleak. Patent stents are seen in both iliac arteries. 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 Small focal dissection is noted in proximal left external iliac artery. 7.5 cm excluded aneurysmal sac is noted in distal descending thoracic aorta without evidence of endoleak. 4 mm nodule seen in left lower lobe. No follow-up needed if patient is low-risk. Non-contrast chest CT can be considered in 12 months  if patient is high-risk. This recommendation follows the consensus statement: Guidelines for Management of Incidental Pulmonary Nodules Detected on CT Images: From the Fleischner Society 2017; Radiology 2017; 284:228-243. Excluded aneurysmal sac infrarenal abdominal aorta has maximum measured diameter 4.5 cm. Patent stents are noted in the celiac, superior mesenteric and right renal arteries. 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 Moderate size fat containing periumbilical hernia. Aortic Atherosclerosis (ICD10-I70.0) and Emphysema (ICD10-J43.9). Electronically Signed   By: Marijo Conception, M.D.   On: 05/06/2017 14:23   Pertinent labs & imaging results that were available during my care of the patient were reviewed by me and considered in my medical decision making (see chart for details).  Medications Ordered in ED Medications  potassium chloride SA (K-DUR,KLOR-CON) CR tablet 40 mEq (40 mEq Oral Given 05/06/17 1241)  potassium chloride 10 mEq in 100 mL IVPB (0 mEq Intravenous Stopped 05/06/17 1400)  0.9 %  sodium chloride infusion (0 mL/hr Intravenous Stopped 05/06/17 1400)  iopamidol (ISOVUE-370) 76 % injection (100 mLs  Contrast Given 05/06/17 1342)                                                                                                                                    Procedures Procedures CRITICAL CARE Performed by: Grayce Sessions Teigan Manner Total critical care time: 40 minutes Critical care time was exclusive of separately billable procedures and treating other patients. Critical care was necessary to treat or  prevent imminent or life-threatening deterioration. Critical care was time spent personally by me on the following activities: development of treatment plan with patient and/or surrogate as well as nursing, discussions with consultants, evaluation of patient's  response to treatment, examination of patient, obtaining history from patient or surrogate, ordering and performing treatments and interventions, ordering and review of laboratory studies, ordering and review of radiographic studies, pulse oximetry and re-evaluation of patient's condition.   (including critical care time)  Medical Decision Making / ED Course I have reviewed the nursing notes for this encounter and the patient's prior records (if available in EHR or on provided paperwork).    Abdomen benign.  Hemoccult positive.  Hemoglobin at 5.8.  PRBC blood transfusion was initiated in the emergency department.  CT of the abdomen was obtained to assess for a possible aortoenteric fistula given his prior history of grafting due to aortic dissection; CT scan was negative for fistula however revealed evidence of possible diverticulitis versus neoplasm of the colon likely causing the bleed.  All the labs revealed mild hypokalemia which was repleted IV and orally.  I discussed the case with the patient's primary care provider, Dr. Terrence Dupont who will admit the patient for continued work-up and management.  I spoke with [facility] who will follow the patient during admission and schedule for likely colonoscopy.  Final Clinical Impression(s) / ED Diagnoses Final diagnoses:  Acute blood loss anemia  Gastrointestinal hemorrhage with melena  Hypokalemia      This chart was dictated using voice recognition software.  Despite best efforts to proofread,  errors can occur which can change the documentation meaning.   Fatima Blank, MD 05/06/17 [phone]

## 2017-05-07 DIAGNOSIS — R933 Abnormal findings on diagnostic imaging of other parts of digestive tract: Secondary | ICD-10-CM

## 2017-05-07 DIAGNOSIS — D500 Iron deficiency anemia secondary to blood loss (chronic): Secondary | ICD-10-CM

## 2017-05-07 LAB — BASIC METABOLIC PANEL
ANION GAP: 8 (ref 5–15)
BUN: <5 mg/dL — ABNORMAL LOW (ref 6–20)
CHLORIDE: 105 mmol/L (ref 101–111)
CO2: 25 mmol/L (ref 22–32)
CREATININE: 1 mg/dL (ref 0.61–1.24)
Calcium: 7.7 mg/dL — ABNORMAL LOW (ref 8.9–10.3)
GFR calc Af Amer: >60 mL/min (ref >60)
GFR calc non Af Amer: >60 mL/min (ref >60)
Glucose, Bld: 76 mg/dL (ref 65–99)
Potassium: 3.2 mmol/L — ABNORMAL LOW (ref 3.5–5.1)
SODIUM: 138 mmol/L (ref 135–145)

## 2017-05-07 LAB — COMPREHENSIVE METABOLIC PANEL
ALT: 7 U/L — ABNORMAL LOW (ref 17–63)
AST: 14 U/L — AB (ref 15–41)
Albumin: 2.2 g/dL — ABNORMAL LOW (ref 3.5–5.0)
Alkaline Phosphatase: 44 U/L (ref 38–126)
Anion gap: 5 (ref 5–15)
BILIRUBIN TOTAL: 1.2 mg/dL (ref 0.3–1.2)
BUN: 6 mg/dL (ref 6–20)
CHLORIDE: 107 mmol/L (ref 101–111)
CO2: 27 mmol/L (ref 22–32)
Calcium: 7.7 mg/dL — ABNORMAL LOW (ref 8.9–10.3)
Creatinine, Ser: 1.12 mg/dL (ref 0.61–1.24)
Glucose, Bld: 85 mg/dL (ref 65–99)
POTASSIUM: 2.9 mmol/L — AB (ref 3.5–5.1)
Sodium: 139 mmol/L (ref 135–145)
TOTAL PROTEIN: 6.7 g/dL (ref 6.5–8.1)

## 2017-05-07 LAB — CBC
HCT: 26.8 % — ABNORMAL LOW (ref 39.0–52.0)
HEMATOCRIT: 21.7 % — AB (ref 39.0–52.0)
Hemoglobin: 6.2 g/dL — CL (ref 13.0–17.0)
Hemoglobin: 7.5 g/dL — ABNORMAL LOW (ref 13.0–17.0)
MCH: 20 pg — AB (ref 26.0–34.0)
MCH: 20.3 pg — ABNORMAL LOW (ref 26.0–34.0)
MCHC: 28.6 g/dL — AB (ref 30.0–36.0)
MCHC: 28 g/dL — ABNORMAL LOW (ref 30.0–36.0)
MCV: 70 fL — AB (ref 78.0–100.0)
MCV: 72.6 fL — AB (ref 78.0–100.0)
PLATELETS: 355 10*3/uL (ref 150–400)
PLATELETS: 335 10*3/uL (ref 150–400)
RBC: 3.1 MIL/uL — ABNORMAL LOW (ref 4.22–5.81)
RBC: 3.69 MIL/uL — AB (ref 4.22–5.81)
RDW: 21.3 % — ABNORMAL HIGH (ref 11.5–15.5)
RDW: 21.5 % — ABNORMAL HIGH (ref 11.5–15.5)
WBC: 8.1 10*3/uL (ref 4.0–10.5)
WBC: 8.2 10*3/uL (ref 4.0–10.5)

## 2017-05-07 LAB — GLUCOSE, CAPILLARY
GLUCOSE-CAPILLARY: 172 mg/dL — AB (ref 65–99)
GLUCOSE-CAPILLARY: 61 mg/dL — AB (ref 65–99)
GLUCOSE-CAPILLARY: 84 mg/dL (ref 65–99)
GLUCOSE-CAPILLARY: 102 mg/dL — AB (ref 65–99)

## 2017-05-07 LAB — PREPARE RBC (CROSSMATCH)

## 2017-05-07 MED ORDER — GLUCOSE 4 G PO CHEW
CHEWABLE_TABLET | ORAL | Status: AC
  Administered 2017-05-07: 4 g
  Filled 2017-05-07: qty 1

## 2017-05-07 MED ORDER — PEG-KCL-NACL-NASULF-NA ASC-C 100 G PO SOLR
0.5000 | Freq: Once | ORAL | Status: AC
  Administered 2017-05-08: 100 g via ORAL

## 2017-05-07 MED ORDER — SODIUM CHLORIDE 0.9 % IV SOLN
510.0000 mg | INTRAVENOUS | Status: AC
  Administered 2017-05-07 – 2017-05-14 (×2): 510 mg via INTRAVENOUS
  Filled 2017-05-07 (×2): qty 17

## 2017-05-07 MED ORDER — SODIUM CHLORIDE 0.9 % IV SOLN
INTRAVENOUS | Status: DC
  Administered 2017-05-07: 20 mL/h via INTRAVENOUS

## 2017-05-07 MED ORDER — BOOST / RESOURCE BREEZE PO LIQD CUSTOM
1.0000 | Freq: Three times a day (TID) | ORAL | Status: DC
  Administered 2017-05-07 – 2017-05-14 (×14): 1 via ORAL

## 2017-05-07 MED ORDER — PEG-KCL-NACL-NASULF-NA ASC-C 100 G PO SOLR: 0.5000 | Freq: Once | ORAL | Status: DC

## 2017-05-07 MED ORDER — POTASSIUM CHLORIDE CRYS ER 20 MEQ PO TBCR
40.0000 meq | EXTENDED_RELEASE_TABLET | Freq: Once | ORAL | Status: AC
  Administered 2017-05-07: 40 meq via ORAL
  Filled 2017-05-07: qty 2

## 2017-05-07 MED ORDER — PEG-KCL-NACL-NASULF-NA ASC-C 100 G PO SOLR
0.5000 | Freq: Once | ORAL | Status: AC
  Administered 2017-05-07: 100 g via ORAL
  Filled 2017-05-07: qty 1

## 2017-05-07 MED ORDER — PEG-KCL-NACL-NASULF-NA ASC-C 100 G PO SOLR
0.5000 | Freq: Once | ORAL | Status: DC
  Filled 2017-05-07: qty 1

## 2017-05-07 MED ORDER — AMLODIPINE BESYLATE 5 MG PO TABS
5.0000 mg | ORAL_TABLET | Freq: Every day | ORAL | Status: DC
  Administered 2017-05-07 – 2017-05-17 (×9): 5 mg via ORAL
  Filled 2017-05-07 (×10): qty 1

## 2017-05-07 MED ORDER — SODIUM CHLORIDE 0.9 % IV SOLN
Freq: Once | INTRAVENOUS | Status: AC
  Administered 2017-05-07: 07:00:00 via INTRAVENOUS

## 2017-05-07 MED ORDER — PEG-KCL-NACL-NASULF-NA ASC-C 100 G PO SOLR: 1.0000 | Freq: Once | ORAL | Status: DC

## 2017-05-07 NOTE — Progress Notes (Addendum)
CRITICAL VALUE ALERT  Critical Value:  6.2 hemoglobin  Date & Time Notied:  05/07/2017 0616  Provider Notified: DR Terrence Dupont  Orders Received/Actions taken: pending

## 2017-05-07 NOTE — Progress Notes (Signed)
Upon this nurse's arrival I went into patient's room and explained the prep for his colonoscopy that will be performed in AM.  Patient immediately became pert and stated, "I am not drinking all this shit.  I've been shitting all day."  Patient has been short and belligerent his entire admission to this unit.  Patient reassured multiple times but has absolutely no effect on the patient.

## 2017-05-07 NOTE — H&P (View-Only) (Signed)
Covering for Dr. Collene Mares and [name]  Consultation  Referring Provider:     Dr. Terrence Dupont Primary Care Physician:  Jesse Forward, MD Primary Gastroenterologist: Dr. Collene Mares        Reason for Consultation: Microcytic Anemia, Abnormal CT abdomen             HPI:   Jesse Le is a 72 y.o. 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    Today, patient is a very poor historian, describes having a generalized abdominal pain, "off-and-on", associated with poor appetite and occasional bright red blood in his stool over the past 2-3 weeks.  Pain increased before a bowel movement, 8-9/10 and then was mostly relieved afterwards.  Also with some episodes of fecal incontinence over the past couple of weeks.  Does describe a change towards a black stool over the past 3 to 4 days but has been using Pepcid.  Explains that his primary care physician told him to come here after having labs.    Denies fever, chills, dizziness or shortness of breath.     ER course: Potassium 2.7, hemoglobin 5.6, iron low at 20, percent saturation 6, ferritin 7, fecal occult blood positive ,CT angio with no evidence of neurovascular disease but showed 9 x 5 cm eccentric soft tissue abnormality involving the sigmoid colon  GI History: Patient does not recall who his primary GI physician is, did have one colonoscopy a long time ago.  Past Medical History:  Diagnosis Date  . Aneurysm of aorta (HCC)   . Coronary artery disease   . Diabetes mellitus without complication ([place])    NEW ONSET 09/2012  . Hypertension   . Myocardial  infarct [facility])     Past Surgical History:  Procedure Laterality Date  . ABDOMINAL AORTIC ANEURYSM REPAIR  10-14-11   TEVAR  . INTRAVASCULAR ULTRASOUND  10/14/2011   Procedure: INTRAVASCULAR ULTRASOUND;  Surgeon: Serafina Mitchell, MD;  Location: [place];  Service: Vascular;  Laterality: N/A;  . VIDEO BRONCHOSCOPY  10/14/2011   Procedure: VIDEO BRONCHOSCOPY;  Surgeon: Serafina Mitchell, MD;  Location: [facility] OR;  Service: Vascular;  Laterality: N/A;   Family History: No colon cancer  Social History   Tobacco Use  . Smoking status: Former Smoker    Packs/day: 0.50    Types: Cigarettes    Last attempt to quit: 07/20/2011    Years since quitting: 5.8  . Smokeless tobacco: Never Used  Substance Use Topics  . Alcohol use: Yes    Alcohol/week: 0.6 oz    Types: 1 Shots of liquor per week    Comment: weekends  . Drug use: No    Prior to Admission medications   Medication Sig Start Date End Date Taking? Authorizing Provider  aspirin EC 81 MG tablet Take 81 mg by mouth daily.   Yes [provider]  metFORMIN (GLUCOPHAGE) 500 MG tablet Take 1 tablet (500 mg total) by mouth 2 (two) times daily with a meal. 10/13/12  Yes Jesse Forward, MD  metoprolol tartrate (LOPRESSOR) 50 MG tablet Take 50 mg by mouth 2 (two) times daily.   Yes [provider]  glimepiride (AMARYL) 2  MG tablet Take 1 tablet (2 mg total) by mouth daily with breakfast. Patient not taking: Reported on 05/06/2017 10/13/12   Jesse Forward, MD  metoprolol (LOPRESSOR) 50 MG tablet Take 1 tablet (50 mg total) by mouth 3 (three) times daily. Patient not taking: Reported on 05/06/2017 10/13/12   Jesse Forward, MD    Current Facility-Administered Medications  Medication Dose Route Frequency Provider Last Rate Last Dose  . 0.9 %  sodium chloride infusion   Intravenous Continuous Jesse Forward, MD 10 mL/hr at 05/06/17 2218 10 mL/hr at 05/06/17 2218  . insulin aspart (novoLOG) injection 0-9 Units  0-9 Units Subcutaneous TID WC  Jesse Forward, MD      . metoprolol tartrate (LOPRESSOR) tablet 25 mg  25 mg Oral BID Jesse Forward, MD   25 mg at 05/07/17 0936  . pantoprazole (PROTONIX) EC tablet 40 mg  40 mg Oral Q0600 Jesse Forward, MD   40 mg at 05/07/17 0611    Allergies as of 05/06/2017  . (No Known Allergies)     Review of Systems:    Constitutional: No weight loss, fever or chills Skin: No rash  Cardiovascular: No chest pain Respiratory: No SOB  Gastrointestinal: See HPI and otherwise negative Genitourinary: No dysuria Neurological: No headache, dizziness or syncope Musculoskeletal: No new muscle or joint pain Hematologic: No bruising Psychiatric: No history of depression or anxiety   Physical Exam:  Vital signs in last 24 hours: Temp:  [97.8 F (36.6 C)-100.2 F (37.9 C)] 98 F (36.7 C) (04/27 0817) Pulse Rate:  [67-92] 67 (04/27 0013) Resp:  [13-28] 28 (04/26 1852) BP: (129-169)/(57-86) 169/81 (04/27 0817) SpO2:  [97 %-100 %] 100 % (04/27 0817) Weight:  [208 lb 15.9 oz (94.8 kg)] 208 lb 15.9 oz (94.8 kg) (04/26 1849) Last BM Date: 05/06/17 General:   AA male appears to be in NAD, Well developed, Well nourished, alert and cooperative Head:  Normocephalic and atraumatic. Eyes:   PEERL, EOMI. No icterus. Conjunctiva pink. Ears:  Normal auditory acuity. Neck:  Supple Throat: Oral cavity and pharynx without inflammation, swelling or lesion. Poor dentition Lungs: Respirations even and unlabored. Lungs clear to auscultation bilaterally.   No wheezes, crackles, or rhonchi.  Heart: Normal S1, S2.+murmur Regular rate and rhythm. No peripheral edema, cyanosis or pallor.  Abdomen:  Soft, nondistended, nontender. No rebound or guarding. Normal bowel sounds. No appreciable masses or hepatomegaly.+umbilical hernia with no ttp, easily reducible Rectal:  Not performed.  Msk:  Symmetrical without gross deformities.  Extremities:  Without edema, no deformity or joint abnormality.  Neurologic:  Alert and   oriented x4;  grossly normal neurologically. Skin:   Dry and intact without significant lesions or rashes. Psychiatric: Demonstrates good judgement and reason without abnormal affect or behaviors.   LAB RESULTS: Recent Labs    05/06/17 1036 05/06/17 2210 05/07/17 0332  WBC 9.8 10.8* 8.1  HGB 5.8* 7.0* 6.2*  HCT 22.8* 25.8* 21.7*  PLT 439* 414* 355   BMET Recent Labs    05/06/17 1036 05/07/17 0332  NA 140 139  K 2.7* 2.9*  CL 103 107  CO2 24 27  GLUCOSE 110* 85  BUN 5* 6  CREATININE 1.17 1.12  CALCIUM 8.2* 7.7*   LFT Recent Labs    05/07/17 0332  PROT 6.7  ALBUMIN 2.2*  AST 14*  ALT 7*  ALKPHOS 44  BILITOT 1.2   STUDIES: Ct Angio Chest/abd/pel For Dissection W And/or Wo Contrast  Result Date: 05/06/2017 CLINICAL DATA:  History  of thoracic aortic aneurysm and dissection. Gastrointestinal bleeding. EXAM: CT ANGIOGRAPHY CHEST, ABDOMEN AND PELVIS TECHNIQUE: Multidetector CT imaging through the chest, abdomen and pelvis was performed using the standard protocol during bolus administration of intravenous contrast. Multiplanar reconstructed images and MIPs were obtained and reviewed to evaluate the vascular anatomy. CONTRAST:  166mL ISOVUE-370 IOPAMIDOL (ISOVUE-370) INJECTION 76% COMPARISON:  CT scans of November 22, 2011 and October 12, 2011. FINDINGS: CTA CHEST FINDINGS Cardiovascular: 4.1 cm ascending thoracic aortic aneurysm is noted. Great vessels are widely patent without significant stenosis. Status post stent graft placement beginning in transverse aortic arch distal to the origin of left subclavian artery, that extends through 7.5 cm distal descending thoracic aortic aneurysm into abdominal aorta. Excluded aneurysmal sac measures 9 cm. Normal cardiac size. No pericardial effusion is noted. Mediastinum/Nodes: No enlarged mediastinal, hilar, or axillary lymph nodes. Thyroid gland, trachea, and esophagus demonstrate no significant findings. Lungs/Pleura: No pneumothorax or  pleural effusion is noted. Mild emphysematous disease is noted in the upper lobes bilaterally. Minimal left lower lobe subsegmental atelectasis is noted. 4 mm nodule is noted in left lower lobe best seen on image number 80 of series 7. Musculoskeletal: No chest wall abnormality. No acute or significant osseous findings. Review of the MIP images confirms the above findings. CTA ABDOMEN AND PELVIS FINDINGS VASCULAR Aorta: Status post stent graft repair of abdominal aortic aneurysm. No endoleak is noted. Graft and limbs are widely patent. Excluded aneurysmal sac of infrarenal abdominal aorta has maximum measured transverse diameter of 4.5 cm. Celiac: Stent is noted in proximal portion which is widely patent. SMA: Stent is noted in proximal portion which is widely patent. Renals: Stent is noted in proximal portion of right renal artery which appears to be patent. 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 IMA: Occluded proximally, with reconstitution of its more distal portion through collaterals. Inflow: Stent graft extends into right common iliac artery which is widely patent. Stent graft extends into left external iliac artery, with separate stent extending into left internal iliac artery. These are patent. 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 Small focal dissection is noted in proximal portion of left external iliac artery. Veins: No obvious venous abnormality within the limitations of this arterial phase study. Review of the MIP images confirms the above findings. NON-VASCULAR Hepatobiliary: Cholelithiasis is noted without inflammation. No definite liver abnormality is noted. No biliary dilatation is noted. Pancreas: Unremarkable. No pancreatic ductal dilatation or surrounding inflammatory changes. Spleen: Normal in size without focal abnormality. Adrenals/Urinary Tract: Adrenal glands are unremarkable. Stable exophytic cyst is seen arising from  midpole of right kidney. 2.3 cm low density is noted in lower pole of right kidney which is increased in size compared to prior exam. It demonstrates average Hounsfield measurement of 23. Severe left renal atrophy is noted with associated left renal cyst. No hydronephrosis or renal obstruction is noted. Urinary bladder is unremarkable. Stomach/Bowel: The stomach appears normal. The appendix appears normal. Diverticulosis of descending colon is noted. Large eccentric soft tissue abnormality is seen involving the sigmoid colon which measures 9 x 5 cm. This is concerning for neoplasm or malignancy, although diverticulitis cannot be excluded. Lymphatic: No significant adenopathy is noted. Reproductive: Prostate is unremarkable. Other: Moderate size fat containing periumbilical hernia is noted. Musculoskeletal: No acute or significant osseous findings. Review of the MIP images confirms the above findings. IMPRESSION: 9 x 5 cm eccentric soft tissue abnormality seen involving the sigmoid colon concerning for  neoplasm or malignancy, although diverticulitis cannot be excluded. Sigmoidoscopy is recommended for further evaluation. 2.3 cm low density is noted in lower pole of right kidney which is increased in size compared to prior exam. Further evaluation with ultrasound is recommended evaluate for cyst versus neoplasm. 4.1 cm ascending thoracic aortic aneurysm. Status post stent graft placement involving thoracic aortic arch and extending through descending thoracic aorta into abdominal aorta. The graft is widely patent without evidence of endoleak. Patent stents are seen in both iliac arteries. 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 Small focal dissection is noted in proximal left external iliac artery. 7.5 cm excluded aneurysmal sac is noted in distal descending thoracic aorta without evidence of endoleak. 4 mm nodule seen in left lower lobe. No follow-up needed if patient is low-risk. Non-contrast chest CT  can be considered in 12 months if patient is high-risk. This recommendation follows the consensus statement: Guidelines for Management of Incidental Pulmonary Nodules Detected on CT Images: From the Fleischner Society 2017; Radiology 2017; 284:228-243. Excluded aneurysmal sac infrarenal abdominal aorta has maximum measured diameter 4.5 cm. Patent stents are noted in the celiac, superior mesenteric and right renal arteries. 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 Moderate size fat containing periumbilical hernia. Aortic Atherosclerosis (ICD10-I70.0) and Emphysema (ICD10-J43.9). Electronically Signed   By: Marijo Conception, M.D.   On: 05/06/2017 14:23    Impression / Plan:   Impression: 1.  IDA: Hgb 5.8 (9.8 10/13/2012) at admission, currently receiving his second of 2 u prbcs, with CT above and history of hematochezia suspect lesion in the sigmoid colon as a source of this acute on chronic anemia 2.  Hematochezia: Intermittent for the past 2 weeks with loss of appetite and weight loss per patient; consider relation to CT above 3.  Abnormal CT of the abdomen: Showing lesion in the sigmoid colon 4.  CKD stage II: Creatinine 1.12 5.  Hypokalemia: Potassium 2.9, being treated by hospitalist  Plan: 1.  Agree with monitoring hemoglobin and transfusion as needed less than 7, currently undergoing blood transfusion now.  Will need to stabilize prior to any procedures. 2.  Will also need correction of hypokalemia prior to procedures, has received oral K since admission, will need recheck of potassium 3.  Patient will need colonoscopy for further evaluation of abnormal CT scan above showing possible mass in sigmoid colon as well as IDA/hematochezia.  Timing dependent on above. Will tentatively arrange for tomorrow. 4.  Patient may be on a clear liquid diet today 5.  Please await any further  recommendations from Dr. Hilarie Fredrickson later today  Thank you for your kind consultation, we will continue to follow.  Jesse Le  05/07/2017, 10:15 AM Pager #: [phone]

## 2017-05-07 NOTE — Clinical Social Work Note (Signed)
CSW acknowledges consult regarding obtaining medications. Please consult RNCM for this need.   CSW signing off. Consult again if any social work needs arise.  [place]ton Scrape, [place]

## 2017-05-07 NOTE — Consult Note (Addendum)
Covering for Dr. Collene Mares and [name]  Consultation  Referring Provider:     Dr. Terrence Dupont Primary Care Physician:  Charolette Forward, MD Primary Gastroenterologist: Dr. Collene Mares        Reason for Consultation: Microcytic Anemia, Abnormal CT abdomen             HPI:   Jesse Le is a 72 y.o. 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    Today, patient is a very poor historian, describes having a generalized abdominal pain, "off-and-on", associated with poor appetite and occasional bright red blood in his stool over the past 2-3 weeks.  Pain increased before a bowel movement, 8-9/10 and then was mostly relieved afterwards.  Also with some episodes of fecal incontinence over the past couple of weeks.  Does describe a change towards a black stool over the past 3 to 4 days but has been using Pepcid.  Explains that his primary care physician told him to come here after having labs.    Denies fever, chills, dizziness or shortness of breath.     ER course: Potassium 2.7, hemoglobin 5.6, iron low at 20, percent saturation 6, ferritin 7, fecal occult blood positive ,CT angio with no evidence of neurovascular disease but showed 9 x 5 cm eccentric soft tissue abnormality involving the sigmoid colon  GI History: Patient does not recall who his primary GI physician is, did have one colonoscopy a long time ago.  Past Medical History:  Diagnosis Date  . Aneurysm of aorta (HCC)   . Coronary artery disease   . Diabetes mellitus without complication ([place])    NEW ONSET 09/2012  . Hypertension   . Myocardial  infarct [facility])     Past Surgical History:  Procedure Laterality Date  . ABDOMINAL AORTIC ANEURYSM REPAIR  10-14-11   TEVAR  . INTRAVASCULAR ULTRASOUND  10/14/2011   Procedure: INTRAVASCULAR ULTRASOUND;  Surgeon: Serafina Mitchell, MD;  Location: [place];  Service: Vascular;  Laterality: N/A;  . VIDEO BRONCHOSCOPY  10/14/2011   Procedure: VIDEO BRONCHOSCOPY;  Surgeon: Serafina Mitchell, MD;  Location: [facility] OR;  Service: Vascular;  Laterality: N/A;   Family History: No colon cancer  Social History   Tobacco Use  . Smoking status: Former Smoker    Packs/day: 0.50    Types: Cigarettes    Last attempt to quit: 07/20/2011    Years since quitting: 5.8  . Smokeless tobacco: Never Used  Substance Use Topics  . Alcohol use: Yes    Alcohol/week: 0.6 oz    Types: 1 Shots of liquor per week    Comment: weekends  . Drug use: No    Prior to Admission medications   Medication Sig Start Date End Date Taking? Authorizing Provider  aspirin EC 81 MG tablet Take 81 mg by mouth daily.   Yes [provider]  metFORMIN (GLUCOPHAGE) 500 MG tablet Take 1 tablet (500 mg total) by mouth 2 (two) times daily with a meal. 10/13/12  Yes Charolette Forward, MD  metoprolol tartrate (LOPRESSOR) 50 MG tablet Take 50 mg by mouth 2 (two) times daily.   Yes [provider]  glimepiride (AMARYL) 2  MG tablet Take 1 tablet (2 mg total) by mouth daily with breakfast. Patient not taking: Reported on 05/06/2017 10/13/12   Charolette Forward, MD  metoprolol (LOPRESSOR) 50 MG tablet Take 1 tablet (50 mg total) by mouth 3 (three) times daily. Patient not taking: Reported on 05/06/2017 10/13/12   Charolette Forward, MD    Current Facility-Administered Medications  Medication Dose Route Frequency Provider Last Rate Last Dose  . 0.9 %  sodium chloride infusion   Intravenous Continuous Charolette Forward, MD 10 mL/hr at 05/06/17 2218 10 mL/hr at 05/06/17 2218  . insulin aspart (novoLOG) injection 0-9 Units  0-9 Units Subcutaneous TID WC  Charolette Forward, MD      . metoprolol tartrate (LOPRESSOR) tablet 25 mg  25 mg Oral BID Charolette Forward, MD   25 mg at 05/07/17 0936  . pantoprazole (PROTONIX) EC tablet 40 mg  40 mg Oral Q0600 Charolette Forward, MD   40 mg at 05/07/17 0611    Allergies as of 05/06/2017  . (No Known Allergies)     Review of Systems:    Constitutional: No weight loss, fever or chills Skin: No rash  Cardiovascular: No chest pain Respiratory: No SOB  Gastrointestinal: See HPI and otherwise negative Genitourinary: No dysuria Neurological: No headache, dizziness or syncope Musculoskeletal: No new muscle or joint pain Hematologic: No bruising Psychiatric: No history of depression or anxiety   Physical Exam:  Vital signs in last 24 hours: Temp:  [97.8 F (36.6 C)-100.2 F (37.9 C)] 98 F (36.7 C) (04/27 0817) Pulse Rate:  [67-92] 67 (04/27 0013) Resp:  [13-28] 28 (04/26 1852) BP: (129-169)/(57-86) 169/81 (04/27 0817) SpO2:  [97 %-100 %] 100 % (04/27 0817) Weight:  [208 lb 15.9 oz (94.8 kg)] 208 lb 15.9 oz (94.8 kg) (04/26 1849) Last BM Date: 05/06/17 General:   AA male appears to be in NAD, Well developed, Well nourished, alert and cooperative Head:  Normocephalic and atraumatic. Eyes:   PEERL, EOMI. No icterus. Conjunctiva pink. Ears:  Normal auditory acuity. Neck:  Supple Throat: Oral cavity and pharynx without inflammation, swelling or lesion. Poor dentition Lungs: Respirations even and unlabored. Lungs clear to auscultation bilaterally.   No wheezes, crackles, or rhonchi.  Heart: Normal S1, S2.+murmur Regular rate and rhythm. No peripheral edema, cyanosis or pallor.  Abdomen:  Soft, nondistended, nontender. No rebound or guarding. Normal bowel sounds. No appreciable masses or hepatomegaly.+umbilical hernia with no ttp, easily reducible Rectal:  Not performed.  Msk:  Symmetrical without gross deformities.  Extremities:  Without edema, no deformity or joint abnormality.  Neurologic:  Alert and   oriented x4;  grossly normal neurologically. Skin:   Dry and intact without significant lesions or rashes. Psychiatric: Demonstrates good judgement and reason without abnormal affect or behaviors.   LAB RESULTS: Recent Labs    05/06/17 1036 05/06/17 2210 05/07/17 0332  WBC 9.8 10.8* 8.1  HGB 5.8* 7.0* 6.2*  HCT 22.8* 25.8* 21.7*  PLT 439* 414* 355   BMET Recent Labs    05/06/17 1036 05/07/17 0332  NA 140 139  K 2.7* 2.9*  CL 103 107  CO2 24 27  GLUCOSE 110* 85  BUN 5* 6  CREATININE 1.17 1.12  CALCIUM 8.2* 7.7*   LFT Recent Labs    05/07/17 0332  PROT 6.7  ALBUMIN 2.2*  AST 14*  ALT 7*  ALKPHOS 44  BILITOT 1.2   STUDIES: Ct Angio Chest/abd/pel For Dissection W And/or Wo Contrast  Result Date: 05/06/2017 CLINICAL DATA:  History  of thoracic aortic aneurysm and dissection. Gastrointestinal bleeding. EXAM: CT ANGIOGRAPHY CHEST, ABDOMEN AND PELVIS TECHNIQUE: Multidetector CT imaging through the chest, abdomen and pelvis was performed using the standard protocol during bolus administration of intravenous contrast. Multiplanar reconstructed images and MIPs were obtained and reviewed to evaluate the vascular anatomy. CONTRAST:  19mL ISOVUE-370 IOPAMIDOL (ISOVUE-370) INJECTION 76% COMPARISON:  CT scans of November 22, 2011 and October 12, 2011. FINDINGS: CTA CHEST FINDINGS Cardiovascular: 4.1 cm ascending thoracic aortic aneurysm is noted. Great vessels are widely patent without significant stenosis. Status post stent graft placement beginning in transverse aortic arch distal to the origin of left subclavian artery, that extends through 7.5 cm distal descending thoracic aortic aneurysm into abdominal aorta. Excluded aneurysmal sac measures 9 cm. Normal cardiac size. No pericardial effusion is noted. Mediastinum/Nodes: No enlarged mediastinal, hilar, or axillary lymph nodes. Thyroid gland, trachea, and esophagus demonstrate no significant findings. Lungs/Pleura: No pneumothorax or  pleural effusion is noted. Mild emphysematous disease is noted in the upper lobes bilaterally. Minimal left lower lobe subsegmental atelectasis is noted. 4 mm nodule is noted in left lower lobe best seen on image number 80 of series 7. Musculoskeletal: No chest wall abnormality. No acute or significant osseous findings. Review of the MIP images confirms the above findings. CTA ABDOMEN AND PELVIS FINDINGS VASCULAR Aorta: Status post stent graft repair of abdominal aortic aneurysm. No endoleak is noted. Graft and limbs are widely patent. Excluded aneurysmal sac of infrarenal abdominal aorta has maximum measured transverse diameter of 4.5 cm. Celiac: Stent is noted in proximal portion which is widely patent. SMA: Stent is noted in proximal portion which is widely patent. Renals: Stent is noted in proximal portion of right renal artery which appears to be patent. 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 IMA: Occluded proximally, with reconstitution of its more distal portion through collaterals. Inflow: Stent graft extends into right common iliac artery which is widely patent. Stent graft extends into left external iliac artery, with separate stent extending into left internal iliac artery. These are patent. 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 Small focal dissection is noted in proximal portion of left external iliac artery. Veins: No obvious venous abnormality within the limitations of this arterial phase study. Review of the MIP images confirms the above findings. NON-VASCULAR Hepatobiliary: Cholelithiasis is noted without inflammation. No definite liver abnormality is noted. No biliary dilatation is noted. Pancreas: Unremarkable. No pancreatic ductal dilatation or surrounding inflammatory changes. Spleen: Normal in size without focal abnormality. Adrenals/Urinary Tract: Adrenal glands are unremarkable. Stable exophytic cyst is seen arising from  midpole of right kidney. 2.3 cm low density is noted in lower pole of right kidney which is increased in size compared to prior exam. It demonstrates average Hounsfield measurement of 23. Severe left renal atrophy is noted with associated left renal cyst. No hydronephrosis or renal obstruction is noted. Urinary bladder is unremarkable. Stomach/Bowel: The stomach appears normal. The appendix appears normal. Diverticulosis of descending colon is noted. Large eccentric soft tissue abnormality is seen involving the sigmoid colon which measures 9 x 5 cm. This is concerning for neoplasm or malignancy, although diverticulitis cannot be excluded. Lymphatic: No significant adenopathy is noted. Reproductive: Prostate is unremarkable. Other: Moderate size fat containing periumbilical hernia is noted. Musculoskeletal: No acute or significant osseous findings. Review of the MIP images confirms the above findings. IMPRESSION: 9 x 5 cm eccentric soft tissue abnormality seen involving the sigmoid colon concerning for  neoplasm or malignancy, although diverticulitis cannot be excluded. Sigmoidoscopy is recommended for further evaluation. 2.3 cm low density is noted in lower pole of right kidney which is increased in size compared to prior exam. Further evaluation with ultrasound is recommended evaluate for cyst versus neoplasm. 4.1 cm ascending thoracic aortic aneurysm. Status post stent graft placement involving thoracic aortic arch and extending through descending thoracic aorta into abdominal aorta. The graft is widely patent without evidence of endoleak. Patent stents are seen in both iliac arteries. 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 Small focal dissection is noted in proximal left external iliac artery. 7.5 cm excluded aneurysmal sac is noted in distal descending thoracic aorta without evidence of endoleak. 4 mm nodule seen in left lower lobe. No follow-up needed if patient is low-risk. Non-contrast chest CT  can be considered in 12 months if patient is high-risk. This recommendation follows the consensus statement: Guidelines for Management of Incidental Pulmonary Nodules Detected on CT Images: From the Fleischner Society 2017; Radiology 2017; 284:228-243. Excluded aneurysmal sac infrarenal abdominal aorta has maximum measured diameter 4.5 cm. Patent stents are noted in the celiac, superior mesenteric and right renal arteries. 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 Moderate size fat containing periumbilical hernia. Aortic Atherosclerosis (ICD10-I70.0) and Emphysema (ICD10-J43.9). Electronically Signed   By: Marijo Conception, M.D.   On: 05/06/2017 14:23    Impression / Plan:   Impression: 1.  IDA: Hgb 5.8 (9.8 10/13/2012) at admission, currently receiving his second of 2 u prbcs, with CT above and history of hematochezia suspect lesion in the sigmoid colon as a source of this acute on chronic anemia 2.  Hematochezia: Intermittent for the past 2 weeks with loss of appetite and weight loss per patient; consider relation to CT above 3.  Abnormal CT of the abdomen: Showing lesion in the sigmoid colon 4.  CKD stage II: Creatinine 1.12 5.  Hypokalemia: Potassium 2.9, being treated by hospitalist  Plan: 1.  Agree with monitoring hemoglobin and transfusion as needed less than 7, currently undergoing blood transfusion now.  Will need to stabilize prior to any procedures. 2.  Will also need correction of hypokalemia prior to procedures, has received oral K since admission, will need recheck of potassium 3.  Patient will need colonoscopy for further evaluation of abnormal CT scan above showing possible mass in sigmoid colon as well as IDA/hematochezia.  Timing dependent on above. Will tentatively arrange for tomorrow. 4.  Patient may be on a clear liquid diet today 5.  Please await any further  recommendations from Dr. Hilarie Fredrickson later today  Thank you for your kind consultation, we will continue to follow.  Lavone Nian Loran Auguste  05/07/2017, 10:15 AM Pager #: [phone]

## 2017-05-07 NOTE — Progress Notes (Signed)
MEDICATION RELATED CONSULT NOTE   Pharmacy Consult for IV Iron Indication: Iron deficiency anemia  No Known Allergies  Iron/TIBC/Ferritin/ %Sat    Component Value Date/Time   IRON 20 (L) 05/06/2017 1210   TIBC 325 05/06/2017 1210   FERRITIN 7 (L) 05/06/2017 1210   IRONPCTSAT 6 (L) 05/06/2017 1210   Assessment: 31 yoM presenting with rectal bleeding, CT abd/pelvis suggestive of sigmoid cancer. Hgb 5.6 on admission >> 7.0 s/p 1 unit PRBC. Hgb now 6.2, to receive another 1 unit PRBC today. Iron labs returned low with tsat 6% and ferritin 7. Pharmacy consulted to dose IV iron.  Goal of Therapy:  Hgb 14 per MD  Plan:  Feraheme 510mg  IV qweek x2 Monitor for s/sx of allergic reaction, hypotension May assess response >30 days following the second dose May be readministered if iron deficiency persists Pharmacy will sign off  [name] N. Gerarda Fraction, PharmD PGY1 Pharmacy Resident Pager: [phone] 05/07/2017,12:51 PM

## 2017-05-07 NOTE — Progress Notes (Signed)
Dr Zenia Resides office called and message regarding low hematocrit left with [facility] telephone operator.  Awaiting further instructions at this time.

## 2017-05-07 NOTE — Progress Notes (Signed)
CRITICAL VALUE ALERT  Critical Value: CBG-61  Date & Time Notied: 04/27-1630  Provider Notified: protocol  Orders Received/Actions taken: protocol

## 2017-05-07 NOTE — Progress Notes (Signed)
Subjective:  Patient denies any chest pain or shortness of breath. States abdominal pain has improved receiving packed RBCs.  Objective:  Vital Signs in the last 24 hours: Temp:  [97.8 F (36.6 C)-100.2 F (37.9 C)] 98 F (36.7 C) (04/27 0817) Pulse Rate:  [67-92] 67 (04/27 0013) Resp:  [13-28] 28 (04/26 1852) BP: (129-169)/(57-86) 169/81 (04/27 0817) SpO2:  [97 %-100 %] 100 % (04/27 0817) Weight:  [94.8 kg (208 lb 15.9 oz)] 94.8 kg (208 lb 15.9 oz) (04/26 1849)  Intake/Output from previous day: 04/26 0701 - 04/27 0700 In: 2017 [P.O.:240; I.V.:1047; Blood:630; IV Piggyback:100] Out: -  Intake/Output from this shift: Total I/O In: 820 [P.O.:820] Out: -   Physical Exam: Neck: no adenopathy, no carotid bruit, no JVD and supple, symmetrical, trachea midline Lungs: clear to auscultation bilaterally Heart: regular rate and rhythm, regularly irregular rhythm and 2/6 systolic murmur noted Abdomen: soft, non-tender; bowel sounds normal; no masses,  no organomegaly Extremities: extremities normal, atraumatic, no cyanosis or edema  Lab Results: Recent Labs    05/06/17 2210 05/07/17 0332  WBC 10.8* 8.1  HGB 7.0* 6.2*  PLT 414* 355   Recent Labs    05/06/17 1036 05/07/17 0332  NA 140 139  K 2.7* 2.9*  CL 103 107  CO2 24 27  GLUCOSE 110* 85  BUN 5* 6  CREATININE 1.17 1.12   No results for input(s): TROPONINI in the last 72 hours.  Invalid input(s): CK, MB Hepatic Function Panel Recent Labs    05/07/17 0332  PROT 6.7  ALBUMIN 2.2*  AST 14*  ALT 7*  ALKPHOS 44  BILITOT 1.2   No results for input(s): CHOL in the last 72 hours. No results for input(s): PROTIME in the last 72 hours.  Imaging: Imaging results have been reviewed and Ct Angio Chest/abd/pel For Dissection W And/or Wo Contrast  Result Date: 05/06/2017 CLINICAL DATA:  History of thoracic aortic aneurysm and dissection. Gastrointestinal bleeding. EXAM: CT ANGIOGRAPHY CHEST, ABDOMEN AND PELVIS TECHNIQUE:  Multidetector CT imaging through the chest, abdomen and pelvis was performed using the standard protocol during bolus administration of intravenous contrast. Multiplanar reconstructed images and MIPs were obtained and reviewed to evaluate the vascular anatomy. CONTRAST:  126mL ISOVUE-370 IOPAMIDOL (ISOVUE-370) INJECTION 76% COMPARISON:  CT scans of November 22, 2011 and October 12, 2011. FINDINGS: CTA CHEST FINDINGS Cardiovascular: 4.1 cm ascending thoracic aortic aneurysm is noted. Great vessels are widely patent without significant stenosis. Status post stent graft placement beginning in transverse aortic arch distal to the origin of left subclavian artery, that extends through 7.5 cm distal descending thoracic aortic aneurysm into abdominal aorta. Excluded aneurysmal sac measures 9 cm. Normal cardiac size. No pericardial effusion is noted. Mediastinum/Nodes: No enlarged mediastinal, hilar, or axillary lymph nodes. Thyroid gland, trachea, and esophagus demonstrate no significant findings. Lungs/Pleura: No pneumothorax or pleural effusion is noted. Mild emphysematous disease is noted in the upper lobes bilaterally. Minimal left lower lobe subsegmental atelectasis is noted. 4 mm nodule is noted in left lower lobe best seen on image number 80 of series 7. Musculoskeletal: No chest wall abnormality. No acute or significant osseous findings. Review of the MIP images confirms the above findings. CTA ABDOMEN AND PELVIS FINDINGS VASCULAR Aorta: Status post stent graft repair of abdominal aortic aneurysm. No endoleak is noted. Graft and limbs are widely patent. Excluded aneurysmal sac of infrarenal abdominal aorta has maximum measured transverse diameter of 4.5 cm. Celiac: Stent is noted in proximal portion which is widely patent. SMA:  Stent is noted in proximal portion which is widely patent. Renals: Stent is noted in proximal portion of right renal artery which appears to be patent. Left renal artery appears to be  chronically occluded with resulting severe left renal atrophy. IMA: Occluded proximally, with reconstitution of its more distal portion through collaterals. Inflow: Stent graft extends into right common iliac artery which is widely patent. Stent graft extends into left external iliac artery, with separate stent extending into left internal iliac artery. These are patent. 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 Small focal dissection is noted in proximal portion of left external iliac artery. Veins: No obvious venous abnormality within the limitations of this arterial phase study. Review of the MIP images confirms the above findings. NON-VASCULAR Hepatobiliary: Cholelithiasis is noted without inflammation. No definite liver abnormality is noted. No biliary dilatation is noted. Pancreas: Unremarkable. No pancreatic ductal dilatation or surrounding inflammatory changes. Spleen: Normal in size without focal abnormality. Adrenals/Urinary Tract: Adrenal glands are unremarkable. Stable exophytic cyst is seen arising from midpole of right kidney. 2.3 cm low density is noted in lower pole of right kidney which is increased in size compared to prior exam. It demonstrates average Hounsfield measurement of 23. Severe left renal atrophy is noted with associated left renal cyst. No hydronephrosis or renal obstruction is noted. Urinary bladder is unremarkable. Stomach/Bowel: The stomach appears normal. The appendix appears normal. Diverticulosis of descending colon is noted. Large eccentric soft tissue abnormality is seen involving the sigmoid colon which measures 9 x 5 cm. This is concerning for neoplasm or malignancy, although diverticulitis cannot be excluded. Lymphatic: No significant adenopathy is noted. Reproductive: Prostate is unremarkable. Other: Moderate size fat containing periumbilical hernia is noted. Musculoskeletal: No acute or significant osseous findings. Review of the MIP images confirms the above  findings. IMPRESSION: 9 x 5 cm eccentric soft tissue abnormality seen involving the sigmoid colon concerning for neoplasm or malignancy, although diverticulitis cannot be excluded. Sigmoidoscopy is recommended for further evaluation. 2.3 cm low density is noted in lower pole of right kidney which is increased in size compared to prior exam. Further evaluation with ultrasound is recommended evaluate for cyst versus neoplasm. 4.1 cm ascending thoracic aortic aneurysm. Status post stent graft placement involving thoracic aortic arch and extending through descending thoracic aorta into abdominal aorta. The graft is widely patent without evidence of endoleak. Patent stents are seen in both iliac arteries. 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 Small focal dissection is noted in proximal left external iliac artery. 7.5 cm excluded aneurysmal sac is noted in distal descending thoracic aorta without evidence of endoleak. 4 mm nodule seen in left lower lobe. No follow-up needed if patient is low-risk. Non-contrast chest CT can be considered in 12 months if patient is high-risk. This recommendation follows the consensus statement: Guidelines for Management of Incidental Pulmonary Nodules Detected on CT Images: From the Fleischner Society 2017; Radiology 2017; 284:228-243. Excluded aneurysmal sac infrarenal abdominal aorta has maximum measured diameter 4.5 cm. Patent stents are noted in the celiac, superior mesenteric and right renal arteries. 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 Moderate size fat containing periumbilical hernia. Aortic Atherosclerosis (ICD10-I70.0) and Emphysema (ICD10-J43.9). Electronically Signed   By: Marijo Conception, M.D.   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

## 2017-05-07 NOTE — Progress Notes (Signed)
Patient was agitated and verbally harsh at start of shift.  Patient was not happy that he was NPO.  After receiving liquids and jello patient was more cooperative and understanding of care.  Oral meds administered and Normal Saline at 10 mls/hr initiated vial peripheral IV.  Iv flushes easily.  Patient denies pain or distress.  Will continue to monitor patient.

## 2017-05-07 NOTE — Progress Notes (Signed)
Initial Nutrition Assessment  DOCUMENTATION CODES:   Not applicable  INTERVENTION:  Provide Boost Breeze po TID, each supplement provides 250 kcal and 9 grams of protein  Encourage adequate PO intake.   NUTRITION DIAGNOSIS:   Increased nutrient needs related to chronic illness(COPD) as evidenced by estimated needs.  GOAL:   Patient will meet greater than or equal to 90% of their needs  MONITOR:   PO intake, Supplement acceptance, Diet advancement, Weight trends, Labs, Skin, I & O's  REASON FOR ASSESSMENT:   Malnutrition Screening Tool    ASSESSMENT:   71 year old male here with iron deficiency anemia with recent rectal bleeding found to have abnormal CT scan of the abdomen and pelvis suggestive of a sigmoid cancer.  He has a history of peripheral vascular disease, aortic aneurysm with endovascular repair, CAD, hypertension, hyperlipidemia, COPD, CKD.  Pt is currently on a clear liquid diet. Pt reports abdominal pains have improved. Pt reports poor appetite however tries to eat at least 3 meals a day. He does report some meals consumed have been small portions, however pt unable to quantity usual food eaten. RD to order Boost Breeze to aid in caloric and protein needs. Pt reports usual body weight unknown. Plans for colonoscopy tomorrow.   Labs and medications reviewed.   NUTRITION - FOCUSED PHYSICAL EXAM:    Most Recent Value  Orbital Region  Unable to assess  Upper Arm Region  No depletion  Thoracic and Lumbar Region  No depletion  Buccal Region  No depletion  Temple Region  Unable to assess  Clavicle Bone Region  Mild depletion  Clavicle and Acromion Bone Region  Mild depletion  Scapular Bone Region  Unable to assess  Dorsal Hand  No depletion  Patellar Region  No depletion  Anterior Thigh Region  No depletion  Posterior Calf Region  No depletion  Edema (RD Assessment)  None  Hair  Reviewed  Eyes  Reviewed  Mouth  Reviewed  Skin  Reviewed  Nails  Reviewed        Diet Order:  Diet clear liquid Room service appropriate? Yes; Fluid consistency: Thin Diet NPO time specified Except for: Sips with Meds  EDUCATION NEEDS:   Not appropriate for education at this time  Skin:  Skin Assessment: Reviewed RN Assessment  Last BM:  4/26  Height:   Ht Readings from Last 1 Encounters:  05/06/17 6\' 1"  (1.854 m)    Weight:   Wt Readings from Last 1 Encounters:  05/06/17 208 lb 15.9 oz (94.8 kg)    Ideal Body Weight:  83.6 kg  BMI:  Body mass index is 27.57 kg/m.  Estimated Nutritional Needs:   Kcal:  2100-2400  Protein:  105-120 grams  Fluid:  2.1 - 2.4 L/day    Corrin Parker, MS, RD, LDN Pager # [phone] After hours/ weekend pager # [phone]

## 2017-05-07 NOTE — Progress Notes (Signed)
Patient resting comfortably at this time.  No signs or symptoms of distress or pain.  Normal saline continues to infuse at 10 mls/hr.  Patient gets up to bedside commode ad lib without problems or shortness of breath.  Patient is cooperative with care at this time.

## 2017-05-08 ENCOUNTER — Inpatient Hospital Stay (HOSPITAL_COMMUNITY): Payer: Medicare Other | Admitting: Anesthesiology

## 2017-05-08 ENCOUNTER — Encounter (HOSPITAL_COMMUNITY): Payer: Self-pay | Admitting: *Deleted

## 2017-05-08 ENCOUNTER — Encounter (HOSPITAL_COMMUNITY)
Admission: EM | Disposition: A | Discharge status: Skilled Nursing Facility | Payer: Self-pay | Source: Home / Self Care | Attending: Cardiology

## 2017-05-08 DIAGNOSIS — C187 Malignant neoplasm of sigmoid colon: Secondary | ICD-10-CM

## 2017-05-08 DIAGNOSIS — D62 Acute posthemorrhagic anemia: Secondary | ICD-10-CM

## 2017-05-08 HISTORY — PX: COLONOSCOPY WITH PROPOFOL: SHX5780

## 2017-05-08 LAB — BASIC METABOLIC PANEL
ANION GAP: 10 (ref 5–15)
BUN: <5 mg/dL — ABNORMAL LOW (ref 6–20)
CALCIUM: 7.7 mg/dL — AB (ref 8.9–10.3)
CO2: 27 mmol/L (ref 22–32)
Chloride: 101 mmol/L (ref 101–111)
Creatinine, Ser: 0.96 mg/dL (ref 0.61–1.24)
Glucose, Bld: 82 mg/dL (ref 65–99)
POTASSIUM: 2.7 mmol/L — AB (ref 3.5–5.1)
SODIUM: 138 mmol/L (ref 135–145)

## 2017-05-08 LAB — CBC
HCT: 26.6 % — ABNORMAL LOW (ref 39.0–52.0)
Hemoglobin: 7.4 g/dL — ABNORMAL LOW (ref 13.0–17.0)
MCH: 20.2 pg — ABNORMAL LOW (ref 26.0–34.0)
MCHC: 27.8 g/dL — ABNORMAL LOW (ref 30.0–36.0)
MCV: 72.7 fL — ABNORMAL LOW (ref 78.0–100.0)
PLATELETS: 351 10*3/uL (ref 150–400)
RBC: 3.66 MIL/uL — ABNORMAL LOW (ref 4.22–5.81)
RDW: 21.8 % — AB (ref 11.5–15.5)
WBC: 8.5 10*3/uL (ref 4.0–10.5)

## 2017-05-08 LAB — GLUCOSE, CAPILLARY
GLUCOSE-CAPILLARY: 70 mg/dL (ref 65–99)
GLUCOSE-CAPILLARY: 97 mg/dL (ref 65–99)

## 2017-05-08 SURGERY — COLONOSCOPY WITH PROPOFOL
Anesthesia: Monitor Anesthesia Care

## 2017-05-08 MED ORDER — OXYCODONE HCL 5 MG PO TABS
5.0000 mg | ORAL_TABLET | ORAL | Status: DC | PRN
  Administered 2017-05-08 – 2017-05-11 (×10): 5 mg via ORAL
  Filled 2017-05-08 (×10): qty 1

## 2017-05-08 MED ORDER — POTASSIUM CHLORIDE 10 MEQ/100ML IV SOLN
10.0000 meq | INTRAVENOUS | Status: AC
  Administered 2017-05-08 (×4): 10 meq via INTRAVENOUS
  Filled 2017-05-08 (×4): qty 100

## 2017-05-08 MED ORDER — LIDOCAINE HCL (CARDIAC) PF 100 MG/5ML IV SOSY
PREFILLED_SYRINGE | INTRAVENOUS | Status: DC | PRN
  Administered 2017-05-08: 50 mg via INTRATRACHEAL

## 2017-05-08 MED ORDER — SPOT INK MARKER SYRINGE KIT
PACK | SUBMUCOSAL | Status: AC
  Filled 2017-05-08: qty 5

## 2017-05-08 MED ORDER — ACETAMINOPHEN 325 MG PO TABS: 650.0000 mg | ORAL_TABLET | Freq: Four times a day (QID) | ORAL | Status: DC | PRN

## 2017-05-08 MED ORDER — PROPOFOL 10 MG/ML IV BOLUS
INTRAVENOUS | Status: DC | PRN
  Administered 2017-05-08: 30 mg via INTRAVENOUS
  Administered 2017-05-08 (×4): 20 mg via INTRAVENOUS
  Administered 2017-05-08: 50 mg via INTRAVENOUS
  Administered 2017-05-08: 20 mg via INTRAVENOUS
  Administered 2017-05-08: 70 mg via INTRAVENOUS
  Administered 2017-05-08: 50 mg via INTRAVENOUS

## 2017-05-08 MED ORDER — SPOT INK MARKER SYRINGE KIT
PACK | SUBMUCOSAL | Status: DC | PRN
  Administered 2017-05-08: 4 mL via SUBMUCOSAL

## 2017-05-08 MED ORDER — POTASSIUM CHLORIDE CRYS ER 20 MEQ PO TBCR
40.0000 meq | EXTENDED_RELEASE_TABLET | Freq: Once | ORAL | Status: AC
  Administered 2017-05-08: 40 meq via ORAL
  Filled 2017-05-08: qty 2

## 2017-05-08 MED ORDER — SODIUM CHLORIDE 0.9 % IV SOLN
INTRAVENOUS | Status: DC | PRN
  Administered 2017-05-08: 09:00:00 via INTRAVENOUS

## 2017-05-08 SURGICAL SUPPLY — 22 items

## 2017-05-08 NOTE — Transfer of Care (Signed)
Immediate Anesthesia Transfer of Care Note  Patient: Jesse Le  Procedure(s) Performed: COLONOSCOPY WITH PROPOFOL (N/A )  Patient Location: Endoscopy Unit  Anesthesia Type:MAC  Level of Consciousness: awake, alert  and oriented  Airway & Oxygen Therapy: Patient Spontanous Breathing and Patient connected to face mask oxygen  Post-op Assessment: Report given to RN, Post -op Vital signs reviewed and stable and Patient moving all extremities X 4  Post vital signs: Reviewed  Last Vitals:  Vitals Value Taken Time  BP    Temp    Pulse    Resp    SpO2      Last Pain:  Vitals:   05/08/17 0916  TempSrc: Oral  PainSc: 0-No pain      Patients Stated Pain Goal: 0 (65/99/35 7017)  Complications: No apparent anesthesia complications

## 2017-05-08 NOTE — Anesthesia Procedure Notes (Signed)
Procedure Name: MAC Date/Time: 05/08/2017 9:38 AM Performed by: Neldon Newport, CRNA Pre-anesthesia Checklist: Timeout performed, Patient being monitored, Suction available, Emergency Drugs available and Patient identified Patient Re-evaluated:Patient Re-evaluated prior to induction Oxygen Delivery Method: Simple face mask

## 2017-05-08 NOTE — Consult Note (Signed)
[facility] Surgery Consult Note  Jesse Le December 17, 1945  [id number].    Requesting MD: Pyrtle Chief Complaint/Reason for Consult: Sigmoid Colon Mass HPI:  Patient is a 72 year old male with PMH significant for CAD, T2DM, Hx of endovascular aortic stent graft at [facility], COPD, CKD stage II, and chronic anemia. Presented to ED 4/26 with generalized abdominal pain, poor appetite and bloody stools for the last couple weeks. Hgb was 5.6 on admission and 9x5 cm mass seen in sigmoid colon on CT. Underwent colonoscopy today with Dr. Hilarie Fredrickson. Patient reports stools with small amounts blood for the last few weeks and decreased appetite, vague abdominal pain and estimated 20 lb weight loss in the last few months. Since being in the hospital he reports more diarrhea that has been mostly bloody. Patient also has an umbilical hernia, reports he has never had it become incarcerated. Has not had a colonoscopy and is not aware of any family history of colon cancer. NKDA. No past abdominal surgeries.   ROS: Review of Systems  Constitutional: Positive for weight loss (20 lbs in the last few months). Negative for chills and fever.  Respiratory: Negative for shortness of breath.   Cardiovascular: Negative for chest pain and palpitations.  Gastrointestinal: Positive for abdominal pain, blood in stool and diarrhea. Negative for nausea and vomiting.  Genitourinary: Negative for dysuria, frequency and urgency.  All other systems reviewed and are negative.   History reviewed. No pertinent family history.  Past Medical History:  Diagnosis Date  . Aneurysm of aorta (HCC)   . Coronary artery disease   . Diabetes mellitus without complication ([place])    NEW ONSET 09/2012  . Hypertension   . Myocardial infarct [facility])     Past Surgical History:  Procedure Laterality Date  . ABDOMINAL AORTIC ANEURYSM REPAIR  10-14-11   TEVAR  . INTRAVASCULAR ULTRASOUND  10/14/2011   Procedure: INTRAVASCULAR ULTRASOUND;  Surgeon:  Serafina Mitchell, MD;  Location: [facility];  Service: Vascular;  Laterality: N/A;  . VIDEO BRONCHOSCOPY  10/14/2011   Procedure: VIDEO BRONCHOSCOPY;  Surgeon: Serafina Mitchell, MD;  Location: [facility] OR;  Service: Vascular;  Laterality: N/A;    Social History:  reports that he quit smoking about 5 years ago. His smoking use included cigarettes. He smoked 0.50 packs per day. He has never used smokeless tobacco. He reports that he drinks about 0.6 oz of alcohol per week. He reports that he does not use drugs.  Allergies: No Known Allergies  Medications Prior to Admission  Medication Sig Dispense Refill  . aspirin EC 81 MG tablet Take 81 mg by mouth daily.    . metFORMIN (GLUCOPHAGE) 500 MG tablet Take 1 tablet (500 mg total) by mouth 2 (two) times daily with a meal. 60 tablet 3  . metoprolol tartrate (LOPRESSOR) 50 MG tablet Take 50 mg by mouth 2 (two) times daily.    Marland Kitchen glimepiride (AMARYL) 2 MG tablet Take 1 tablet (2 mg total) by mouth daily with breakfast. (Patient not taking: Reported on 05/06/2017) 30 tablet 3  . metoprolol (LOPRESSOR) 50 MG tablet Take 1 tablet (50 mg total) by mouth 3 (three) times daily. (Patient not taking: Reported on 05/06/2017) 90 tablet 3    Blood pressure 140/65, pulse 83, temperature 98.7 F (37.1 C), temperature source Oral, resp. rate 10, height 6' 1" (1.854 m), weight 94.8 kg (208 lb 15.9 oz), SpO2 100 %. Physical Exam: Physical Exam  Constitutional: He is oriented to person, place, and time. He  appears well-developed and well-nourished. He is cooperative.  Non-toxic appearance. No distress.  HENT:  Head: Normocephalic and atraumatic.  Right Ear: External ear normal.  Left Ear: External ear normal.  Nose: Nose normal.  Mouth/Throat: Mucous membranes are normal.  Eyes: Conjunctivae, EOM and lids are normal. No scleral icterus.  Pupils equal and round  Neck: Normal range of motion and phonation normal. Neck supple.  Cardiovascular: Normal rate and regular rhythm.   Pulses:      Radial pulses are 2+ on the right side, and 2+ on the left side.       Dorsalis pedis pulses are 2+ on the right side, and 2+ on the left side.  Pulmonary/Chest: Effort normal and breath sounds normal.  Abdominal: Soft. Bowel sounds are normal. He exhibits distension. There is tenderness in the right lower quadrant, suprapubic area and left lower quadrant. There is no rigidity, no rebound and no guarding. A hernia is present. Hernia confirmed positive in the ventral area.  Musculoskeletal:  ROM grossly intact in bilateral upper and lower extremities  Neurological: He is alert and oriented to person, place, and time.  Skin: Skin is warm, dry and intact.  Psychiatric: He has a normal mood and affect. His speech is normal and behavior is normal.    Results for orders placed or performed during the hospital encounter of 05/06/17 (from the past 48 hour(s))  I-stat troponin, ED     Status: None   Collection Time: 05/06/17 11:40 AM  Result Value Ref Range   Troponin i, poc 0.01 0.00 - 0.08 ng/mL   Comment 3            Comment: Due to the release kinetics of cTnI, a negative result within the first hours of the onset of symptoms does not rule out myocardial infarction with certainty. If myocardial infarction is still suspected, repeat the test at appropriate intervals.   Type and screen     Status: None (Preliminary result)   Collection Time: 05/06/17 12:00 PM  Result Value Ref Range   ABO/RH(D) B POS    Antibody Screen NEG    Sample Expiration 05/09/2017    Unit Number [id number]    Blood Component Type RED CELLS,LR    Unit division 00    Status of Unit ISSUED,FINAL    Transfusion Status OK TO TRANSFUSE    Crossmatch Result Compatible    Unit Number [id number]    Blood Component Type RED CELLS,LR    Unit division 00    Status of Unit ISSUED,FINAL    Transfusion Status OK TO TRANSFUSE    Crossmatch Result      Compatible Performed at [facility],  [place] [address] [place], [place] [address]    Unit Number [id number]    Blood Component Type RED CELLS,LR    Unit division 00    Status of Unit ALLOCATED    Transfusion Status OK TO TRANSFUSE    Crossmatch Result Compatible   Vitamin B12     Status: Abnormal   Collection Time: 05/06/17 12:10 PM  Result Value Ref Range   Vitamin B-12 97 (L) 180 - 914 pg/mL    Comment: (NOTE) This assay is not validated for testing neonatal or myeloproliferative syndrome specimens for Vitamin B12 levels. Performed at [facility], [place] [address] [place], [place] [address]   Folate     Status: None   Collection Time: 05/06/17 12:10 PM  Result Value Ref Range   Folate  7.1 >5.9 ng/mL    Comment: Performed at [facility], [place] [address] [place], [place] [address]  Iron and TIBC     Status: Abnormal   Collection Time: 05/06/17 12:10 PM  Result Value Ref Range   Iron 20 (L) 45 - 182 ug/dL   TIBC 325 250 - 450 ug/dL   Saturation Ratios 6 (L) 17.9 - 39.5 %   UIBC 305 ug/dL    Comment: Performed at [facility], [place] [address] [place], [place] [address]  Ferritin     Status: Abnormal   Collection Time: 05/06/17 12:10 PM  Result Value Ref Range   Ferritin 7 (L) 24 - 336 ng/mL    Comment: Performed at [facility], [place] [address], [place], [place] [address]  Reticulocytes     Status: Abnormal   Collection Time: 05/06/17 12:10 PM  Result Value Ref Range   Retic Ct Pct 1.9 0.4 - 3.1 %   RBC. 3.36 (L) 4.22 - 5.81 MIL/uL   Retic Count, Absolute 63.8 19.0 - 186.0 K/uL    Comment: Performed at [facility] [address], [place], [place] [address]  POC occult blood, ED Provider will collect     Status: Abnormal   Collection Time: 05/06/17 12:33 PM  Result Value Ref Range   Fecal Occult Bld POSITIVE (A) NEGATIVE  Prepare RBC     Status: None   Collection Time: 05/06/17 12:33 PM  Result Value Ref Range   Order Confirmation      ORDER PROCESSED BY BLOOD BANK Performed  at [facility], [place] [address] [place], [place] [address]   Urinalysis, Routine w reflex microscopic     Status: Abnormal   Collection Time: 05/06/17  5:19 PM  Result Value Ref Range   Color, Urine YELLOW YELLOW   APPearance HAZY (A) CLEAR   Specific Gravity, Urine 1.035 (H) 1.005 - 1.030   pH 6.0 5.0 - 8.0   Glucose, UA NEGATIVE NEGATIVE mg/dL   Hgb urine dipstick NEGATIVE NEGATIVE   Bilirubin Urine NEGATIVE NEGATIVE   Ketones, ur NEGATIVE NEGATIVE mg/dL   Protein, ur NEGATIVE NEGATIVE mg/dL   Nitrite NEGATIVE NEGATIVE   Leukocytes, UA SMALL (A) NEGATIVE   RBC / HPF 0-5 0 - 5 RBC/hpf   WBC, UA 11-20 0 - 5 WBC/hpf   Bacteria, UA RARE (A) NONE SEEN   Squamous Epithelial / LPF 0-5 0 - 5    Comment: Please note change in reference range.   Trichomonas, UA PRESENT     Comment: Performed at [facility], [place] [address] [place], [place] [address]  CBC     Status: Abnormal   Collection Time: 05/06/17 10:10 PM  Result Value Ref Range   WBC 10.8 (H) 4.0 - 10.5 K/uL   RBC 3.63 (L) 4.22 - 5.81 MIL/uL   Hemoglobin 7.0 (L) 13.0 - 17.0 g/dL   HCT 25.8 (L) 39.0 - 52.0 %   MCV 71.1 (L) 78.0 - 100.0 fL   MCH 19.3 (L) 26.0 - 34.0 pg   MCHC 27.1 (L) 30.0 - 36.0 g/dL   RDW 21.6 (H) 11.5 - 15.5 %   Platelets 414 (H) 150 - 400 K/uL    Comment: Performed at [facility], [place] [address], [place], [place] [address]  Hemoglobin A1c     Status: None   Collection Time: 05/06/17 10:10 PM  Result Value Ref Range   Hgb A1c  MFr Bld 4.9 4.8 - 5.6 %    Comment: (NOTE) Pre diabetes:          5.7%-6.4% Diabetes:              >6.4% Glycemic control for   <7.0% adults with diabetes    Mean Plasma Glucose 93.93 mg/dL    Comment: Performed at [facility] [address] [place], [place] [address]  CBC     Status: Abnormal   Collection Time: 05/07/17  3:32 AM  Result Value Ref Range   WBC 8.1 4.0 - 10.5 K/uL   RBC 3.10 (L) 4.22 - 5.81 MIL/uL   Hemoglobin 6.2 (LL) 13.0 - 17.0  g/dL    Comment: REPEATED TO VERIFY CRITICAL RESULT CALLED TO, READ BACK BY AND VERIFIED WITH: A. Bonsell RN 0430 05/07/17 HMILES    HCT 21.7 (L) 39.0 - 52.0 %   MCV 70.0 (L) 78.0 - 100.0 fL   MCH 20.0 (L) 26.0 - 34.0 pg   MCHC 28.6 (L) 30.0 - 36.0 g/dL   RDW 21.3 (H) 11.5 - 15.5 %   Platelets 355 150 - 400 K/uL    Comment: Performed at [facility], [place] [address] [place], [place] [address]  Comprehensive metabolic panel     Status: Abnormal   Collection Time: 05/07/17  3:32 AM  Result Value Ref Range   Sodium 139 135 - 145 mmol/L   Potassium 2.9 (L) 3.5 - 5.1 mmol/L   Chloride 107 101 - 111 mmol/L   CO2 27 22 - 32 mmol/L   Glucose, Bld 85 65 - 99 mg/dL   BUN 6 6 - 20 mg/dL   Creatinine, Ser 1.12 0.61 - 1.24 mg/dL   Calcium 7.7 (L) 8.9 - 10.3 mg/dL   Total Protein 6.7 6.5 - 8.1 g/dL   Albumin 2.2 (L) 3.5 - 5.0 g/dL   AST 14 (L) 15 - 41 U/L   ALT 7 (L) 17 - 63 U/L   Alkaline Phosphatase 44 38 - 126 U/L   Total Bilirubin 1.2 0.3 - 1.2 mg/dL   GFR calc non Af Amer >60 >60 mL/min   GFR calc Af Amer >60 >60 mL/min    Comment: (NOTE) The eGFR has been calculated using the CKD EPI equation. This calculation has not been validated in all clinical situations. eGFR's persistently <60 mL/min signify possible Chronic Kidney Disease.    Anion gap 5 5 - 15    Comment: Performed at [facility] [address] [place], [place] [address]  Glucose, capillary     Status: Abnormal   Collection Time: 05/07/17 11:46 AM  Result Value Ref Range   Glucose-Capillary 172 (H) 65 - 99 mg/dL   Comment 1 Document in Chart   Glucose, capillary     Status: Abnormal   Collection Time: 05/07/17  4:04 PM  Result Value Ref Range   Glucose-Capillary 61 (L) 65 - 99 mg/dL  Basic metabolic panel     Status: Abnormal   Collection Time: 05/07/17  4:32 PM  Result Value Ref Range   Sodium 138 135 - 145 mmol/L   Potassium 3.2 (L) 3.5 - 5.1 mmol/L   Chloride 105 101 - 111 mmol/L   CO2 25 22 - 32  mmol/L   Glucose, Bld 76 65 - 99 mg/dL   BUN <5 (L) 6 - 20 mg/dL   Creatinine, Ser 1.00 0.61 - 1.24 mg/dL   Calcium 7.7 (L) 8.9 - 10.3  mg/dL   GFR calc non Af Amer >60 >60 mL/min   GFR calc Af Amer >60 >60 mL/min    Comment: (NOTE) The eGFR has been calculated using the CKD EPI equation. This calculation has not been validated in all clinical situations. eGFR's persistently <60 mL/min signify possible Chronic Kidney Disease.    Anion gap 8 5 - 15    Comment: Performed at [facility] [address], [place], [place] [address]  CBC     Status: Abnormal   Collection Time: 05/07/17  4:32 PM  Result Value Ref Range   WBC 8.2 4.0 - 10.5 K/uL   RBC 3.69 (L) 4.22 - 5.81 MIL/uL   Hemoglobin 7.5 (L) 13.0 - 17.0 g/dL   HCT 26.8 (L) 39.0 - 52.0 %   MCV 72.6 (L) 78.0 - 100.0 fL   MCH 20.3 (L) 26.0 - 34.0 pg   MCHC 28.0 (L) 30.0 - 36.0 g/dL   RDW 21.5 (H) 11.5 - 15.5 %   Platelets 335 150 - 400 K/uL    Comment: Performed at [facility], [place] [address], [place], [place] [address]  Prepare RBC     Status: None   Collection Time: 05/07/17  4:49 PM  Result Value Ref Range   Order Confirmation      ORDER PROCESSED BY BLOOD BANK ALREADY DONE BY MELISSA BROGDON [place]utter AT 0962 05/07/17. Performed at [facility], [place] [address], [place], [place] [address]   Glucose, capillary     Status: None   Collection Time: 05/07/17  5:02 PM  Result Value Ref Range   Glucose-Capillary 84 65 - 99 mg/dL  Glucose, capillary     Status: Abnormal   Collection Time: 05/07/17  7:50 PM  Result Value Ref Range   Glucose-Capillary 102 (H) 65 - 99 mg/dL   Ct Angio Chest/abd/pel For Dissection W And/or Wo Contrast  Result Date: 05/06/2017 CLINICAL DATA:  History of thoracic aortic aneurysm and dissection. Gastrointestinal bleeding. EXAM: CT ANGIOGRAPHY CHEST, ABDOMEN AND PELVIS TECHNIQUE: Multidetector CT imaging through the chest, abdomen and pelvis was performed using the standard protocol during  bolus administration of intravenous contrast. Multiplanar reconstructed images and MIPs were obtained and reviewed to evaluate the vascular anatomy. CONTRAST:  12m ISOVUE-370 IOPAMIDOL (ISOVUE-370) INJECTION 76% COMPARISON:  CT scans of November 22, 2011 and October 12, 2011. FINDINGS: CTA CHEST FINDINGS Cardiovascular: 4.1 cm ascending thoracic aortic aneurysm is noted. Great vessels are widely patent without significant stenosis. Status post stent graft placement beginning in transverse aortic arch distal to the origin of left subclavian artery, that extends through 7.5 cm distal descending thoracic aortic aneurysm into abdominal aorta. Excluded aneurysmal sac measures 9 cm. Normal cardiac size. No pericardial effusion is noted. Mediastinum/Nodes: No enlarged mediastinal, hilar, or axillary lymph nodes. Thyroid gland, trachea, and esophagus demonstrate no significant findings. Lungs/Pleura: No pneumothorax or pleural effusion is noted. Mild emphysematous disease is noted in the upper lobes bilaterally. Minimal left lower lobe subsegmental atelectasis is noted. 4 mm nodule is noted in left lower lobe best seen on image number 80 of series 7. Musculoskeletal: No chest wall abnormality. No acute or significant osseous findings. Review of the MIP images confirms the above findings. CTA ABDOMEN AND PELVIS FINDINGS VASCULAR Aorta: Status post stent graft repair of abdominal aortic aneurysm. No endoleak is noted. Graft and limbs are widely patent. Excluded aneurysmal sac of infrarenal abdominal aorta has maximum measured transverse diameter of 4.5 cm. Celiac: Stent is  noted in proximal portion which is widely patent. SMA: Stent is noted in proximal portion which is widely patent. Renals: Stent is noted in proximal portion of right renal artery which appears to be patent. 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 IMA: Occluded proximally, with reconstitution of its  more distal portion through collaterals. Inflow: Stent graft extends into right common iliac artery which is widely patent. Stent graft extends into left external iliac artery, with separate stent extending into left internal iliac artery. These are patent. 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 Small focal dissection is noted in proximal portion of left external iliac artery. Veins: No obvious venous abnormality within the limitations of this arterial phase study. Review of the MIP images confirms the above findings. NON-VASCULAR Hepatobiliary: Cholelithiasis is noted without inflammation. No definite liver abnormality is noted. No biliary dilatation is noted. Pancreas: Unremarkable. No pancreatic ductal dilatation or surrounding inflammatory changes. Spleen: Normal in size without focal abnormality. Adrenals/Urinary Tract: Adrenal glands are unremarkable. Stable exophytic cyst is seen arising from midpole of right kidney. 2.3 cm low density is noted in lower pole of right kidney which is increased in size compared to prior exam. It demonstrates average Hounsfield measurement of 23. Severe left renal atrophy is noted with associated left renal cyst. No hydronephrosis or renal obstruction is noted. Urinary bladder is unremarkable. Stomach/Bowel: The stomach appears normal. The appendix appears normal. Diverticulosis of descending colon is noted. Large eccentric soft tissue abnormality is seen involving the sigmoid colon which measures 9 x 5 cm. This is concerning for neoplasm or malignancy, although diverticulitis cannot be excluded. Lymphatic: No significant adenopathy is noted. Reproductive: Prostate is unremarkable. Other: Moderate size fat containing periumbilical hernia is noted. Musculoskeletal: No acute or significant osseous findings. Review of the MIP images confirms the above findings. IMPRESSION: 9 x 5 cm eccentric soft tissue abnormality seen involving the sigmoid colon concerning for  neoplasm or malignancy, although diverticulitis cannot be excluded. Sigmoidoscopy is recommended for further evaluation. 2.3 cm low density is noted in lower pole of right kidney which is increased in size compared to prior exam. Further evaluation with ultrasound is recommended evaluate for cyst versus neoplasm. 4.1 cm ascending thoracic aortic aneurysm. Status post stent graft placement involving thoracic aortic arch and extending through descending thoracic aorta into abdominal aorta. The graft is widely patent without evidence of endoleak. Patent stents are seen in both iliac arteries. 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 Small focal dissection is noted in proximal left external iliac artery. 7.5 cm excluded aneurysmal sac is noted in distal descending thoracic aorta without evidence of endoleak. 4 mm nodule seen in left lower lobe. No follow-up needed if patient is low-risk. Non-contrast chest CT can be considered in 12 months if patient is high-risk. This recommendation follows the consensus statement: Guidelines for Management of Incidental Pulmonary Nodules Detected on CT Images: From the Fleischner Society 2017; Radiology 2017; 284:228-243. Excluded aneurysmal sac infrarenal abdominal aorta has maximum measured diameter 4.5 cm. Patent stents are noted in the celiac, superior mesenteric and right renal arteries. 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 Moderate size fat containing periumbilical hernia. Aortic Atherosclerosis (ICD10-I70.0) and Emphysema (ICD10-J43.9). Electronically Signed   By: Marijo Conception, M.D.   On: 05/06/2017 14:23      Assessment/Plan Hx of MI T2DM HTN  COPD  Sigmoid colon mass  - CEA ordered - seen on CT and colonoscopy - bx pending - hgb 7.5 yesterday, continue to monitor - will need cardiac and medical clearance - keep on CLD for  now, will discuss planning for resection with MD  FEN: CLD VTE: SCDs ID: none   Brigid Re, [facility] Surgery 05/08/2017, 11:30 AM Pager: [phone] Consults: [phone] Mon-Fri 7:00 am-4:30 pm Sat-Sun 7:00 am-11:30 am

## 2017-05-08 NOTE — Progress Notes (Signed)
Subjective:  Appreciate GI consult and help. Results of colonoscopy noted. Patient complains of vague abdominal pain. Denies any chest pain or shortness of breath.  Objective:  Vital Signs in the last 24 hours: Temp:  [97.7 F (36.5 C)-98.7 F (37.1 C)] 98.7 F (37.1 C) (04/28 1003) Pulse Rate:  [64-83] 83 (04/28 1003) Resp:  [10-20] 10 (04/28 1003) BP: (140-204)/(65-108) 140/65 (04/28 1003) SpO2:  [97 %-100 %] 100 % (04/28 1003)  Intake/Output from previous day: 04/27 0701 - 04/28 0700 In: 2202.3 [P.O.:1220; I.V.:375.7; Blood:489.6; IV Piggyback:117] Out: 1050 [Urine:1050] Intake/Output from this shift: Total I/O In: 330 [I.V.:330] Out: 2 [Blood:2]  Physical Exam: Neck: no adenopathy, no carotid bruit, no JVD and supple, symmetrical, trachea midline Lungs: clear to auscultation bilaterally Heart: Regular rate and rhythm soft systolic murmur noted Abdomen: Soft bowel sounds present mild generalized tenderness noted Extremities: extremities normal, atraumatic, no cyanosis or edema  Lab Results: Recent Labs    05/07/17 0332 05/07/17 1632  WBC 8.1 8.2  HGB 6.2* 7.5*  PLT 355 335   Recent Labs    05/07/17 0332 05/07/17 1632  NA 139 138  K 2.9* 3.2*  CL 107 105  CO2 27 25  GLUCOSE 85 76  BUN 6 <5*  CREATININE 1.12 1.00   No results for input(s): TROPONINI in the last 72 hours.  Invalid input(s): CK, MB Hepatic Function Panel Recent Labs    05/07/17 0332  PROT 6.7  ALBUMIN 2.2*  AST 14*  ALT 7*  ALKPHOS 44  BILITOT 1.2   No results for input(s): CHOL in the last 72 hours. No results for input(s): PROTIME in the last 72 hours.  Imaging: Imaging results have been reviewed and Ct Angio Chest/abd/pel For Dissection W And/or Wo Contrast  Result Date: 05/06/2017 CLINICAL DATA:  History of thoracic aortic aneurysm and dissection. Gastrointestinal bleeding. EXAM: CT ANGIOGRAPHY CHEST, ABDOMEN AND PELVIS TECHNIQUE: Multidetector CT imaging through the chest,  abdomen and pelvis was performed using the standard protocol during bolus administration of intravenous contrast. Multiplanar reconstructed images and MIPs were obtained and reviewed to evaluate the vascular anatomy. CONTRAST:  115mL ISOVUE-370 IOPAMIDOL (ISOVUE-370) INJECTION 76% COMPARISON:  CT scans of November 22, 2011 and October 12, 2011. FINDINGS: CTA CHEST FINDINGS Cardiovascular: 4.1 cm ascending thoracic aortic aneurysm is noted. Great vessels are widely patent without significant stenosis. Status post stent graft placement beginning in transverse aortic arch distal to the origin of left subclavian artery, that extends through 7.5 cm distal descending thoracic aortic aneurysm into abdominal aorta. Excluded aneurysmal sac measures 9 cm. Normal cardiac size. No pericardial effusion is noted. Mediastinum/Nodes: No enlarged mediastinal, hilar, or axillary lymph nodes. Thyroid gland, trachea, and esophagus demonstrate no significant findings. Lungs/Pleura: No pneumothorax or pleural effusion is noted. Mild emphysematous disease is noted in the upper lobes bilaterally. Minimal left lower lobe subsegmental atelectasis is noted. 4 mm nodule is noted in left lower lobe best seen on image number 80 of series 7. Musculoskeletal: No chest wall abnormality. No acute or significant osseous findings. Review of the MIP images confirms the above findings. CTA ABDOMEN AND PELVIS FINDINGS VASCULAR Aorta: Status post stent graft repair of abdominal aortic aneurysm. No endoleak is noted. Graft and limbs are widely patent. Excluded aneurysmal sac of infrarenal abdominal aorta has maximum measured transverse diameter of 4.5 cm. Celiac: Stent is noted in proximal portion which is widely patent. SMA: Stent is noted in proximal portion which is widely patent. Renals: Stent is noted in proximal  portion of right renal artery which appears to be patent. 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 IMA: Occluded proximally, with reconstitution of its more distal portion through collaterals. Inflow: Stent graft extends into right common iliac artery which is widely patent. Stent graft extends into left external iliac artery, with separate stent extending into left internal iliac artery. These are patent. 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 Small focal dissection is noted in proximal portion of left external iliac artery. Veins: No obvious venous abnormality within the limitations of this arterial phase study. Review of the MIP images confirms the above findings. NON-VASCULAR Hepatobiliary: Cholelithiasis is noted without inflammation. No definite liver abnormality is noted. No biliary dilatation is noted. Pancreas: Unremarkable. No pancreatic ductal dilatation or surrounding inflammatory changes. Spleen: Normal in size without focal abnormality. Adrenals/Urinary Tract: Adrenal glands are unremarkable. Stable exophytic cyst is seen arising from midpole of right kidney. 2.3 cm low density is noted in lower pole of right kidney which is increased in size compared to prior exam. It demonstrates average Hounsfield measurement of 23. Severe left renal atrophy is noted with associated left renal cyst. No hydronephrosis or renal obstruction is noted. Urinary bladder is unremarkable. Stomach/Bowel: The stomach appears normal. The appendix appears normal. Diverticulosis of descending colon is noted. Large eccentric soft tissue abnormality is seen involving the sigmoid colon which measures 9 x 5 cm. This is concerning for neoplasm or malignancy, although diverticulitis cannot be excluded. Lymphatic: No significant adenopathy is noted. Reproductive: Prostate is unremarkable. Other: Moderate size fat containing periumbilical hernia is noted. Musculoskeletal: No acute or significant osseous findings. Review of the MIP images confirms the above findings. IMPRESSION: 9 x 5 cm eccentric soft  tissue abnormality seen involving the sigmoid colon concerning for neoplasm or malignancy, although diverticulitis cannot be excluded. Sigmoidoscopy is recommended for further evaluation. 2.3 cm low density is noted in lower pole of right kidney which is increased in size compared to prior exam. Further evaluation with ultrasound is recommended evaluate for cyst versus neoplasm. 4.1 cm ascending thoracic aortic aneurysm. Status post stent graft placement involving thoracic aortic arch and extending through descending thoracic aorta into abdominal aorta. The graft is widely patent without evidence of endoleak. Patent stents are seen in both iliac arteries. 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 Small focal dissection is noted in proximal left external iliac artery. 7.5 cm excluded aneurysmal sac is noted in distal descending thoracic aorta without evidence of endoleak. 4 mm nodule seen in left lower lobe. No follow-up needed if patient is low-risk. Non-contrast chest CT can be considered in 12 months if patient is high-risk. This recommendation follows the consensus statement: Guidelines for Management of Incidental Pulmonary Nodules Detected on CT Images: From the Fleischner Society 2017; Radiology 2017; 284:228-243. Excluded aneurysmal sac infrarenal abdominal aorta has maximum measured diameter 4.5 cm. Patent stents are noted in the celiac, superior mesenteric and right renal arteries. 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 Moderate size fat containing periumbilical hernia. Aortic Atherosclerosis (ICD10-I70.0) and Emphysema (ICD10-J43.9). Electronically Signed   By: Marijo Conception, M.D.   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 Surgical consultation has been called by GI   LOS: 2 days    Jesse Le 05/08/2017, 11:13 AM

## 2017-05-08 NOTE — Anesthesia Postprocedure Evaluation (Signed)
Anesthesia Post Note  Patient: Jesse Le  Procedure(s) Performed: COLONOSCOPY WITH PROPOFOL (N/A )     Patient location during evaluation: PACU Anesthesia Type: MAC Level of consciousness: awake and alert Pain management: pain level controlled Vital Signs Assessment: post-procedure vital signs reviewed and stable Respiratory status: spontaneous breathing, nonlabored ventilation and respiratory function stable Cardiovascular status: stable and blood pressure returned to baseline Postop Assessment: no apparent nausea or vomiting Anesthetic complications: no    Last Vitals:  Vitals:   05/08/17 0916 05/08/17 1003  BP: (!) 204/87 140/65  Pulse: 78 83  Resp: 18 10  Temp: 36.5 C 37.1 C  SpO2: 97% 100%    Last Pain:  Vitals:   05/08/17 1003  TempSrc: Oral  PainSc: 0-No pain                 Verita Kuroda,W. EDMOND

## 2017-05-08 NOTE — Progress Notes (Signed)
Dr Ulice Dash Pyrtle's telephone answering service called. Message left regarding noncompliance with bowel prep.  Patient refuses to ingest anymore.  Continues to state that he has had multiple bowel movements.  Patient educated on the importance of the bowel prep, however, patient insists on noncompliance.

## 2017-05-08 NOTE — Op Note (Signed)
[facility] Patient Name: Jesse Le Procedure Date : 05/08/2017 MRN: [id number] Attending MD: Jerene Bears , MD Date of Birth: 1945-02-07 CSN: [id number] Age: 72 Admit Type: Inpatient Procedure:                Colonoscopy Indications:              Rectal bleeding, Iron deficiency anemia secondary                            to chronic blood loss, Abnormal CT of the GI tract Providers:                Lajuan Lines. Hilarie Fredrickson, MD, Elna Breslow, RN, Nevin Bloodgood, Technician, Neldon Newport CRNA, CRNA Referring MD:             [facility] Medicines:                Monitored Anesthesia Care Complications:            No immediate complications. Estimated Blood Loss:     Estimated blood loss was minimal. Procedure:                Pre-Anesthesia Assessment:                           - Prior to the procedure, a History and Physical                            was performed, and patient medications and                            allergies were reviewed. The patient's tolerance of                            previous anesthesia was also reviewed. The risks                            and benefits of the procedure and the sedation                            options and risks were discussed with the patient.                            All questions were answered, and informed consent                            was obtained. Prior Anticoagulants: The patient has                            taken no previous anticoagulant or antiplatelet                            agents. ASA Grade Assessment: III - A patient with  severe systemic disease. After reviewing the risks                            and benefits, the patient was deemed in                            satisfactory condition to undergo the procedure.                           After obtaining informed consent, the colonoscope                            was passed under  direct vision. Throughout the                            procedure, the patient's blood pressure, pulse, and                            oxygen saturations were monitored continuously. The                            EC-[id number] ([id number]) scope was introduced through                            the anus and advanced to the cecum, identified by                            its appearance. The colonoscopy was somewhat                            difficult due to inadequate bowel prep. The patient                            tolerated the procedure well. Scope In: 9:32:40 AM Scope Out: 9:57:15 AM Scope Withdrawal Time: 0 hours 15 minutes 8 seconds  Total Procedure Duration: 0 hours 24 minutes 35 seconds  Findings:      The digital rectal exam was normal.      A fungating partially obstructing large mass was found in the       recto-sigmoid colon and in the distal sigmoid colon. The mass was       partially circumferential (involving two-thirds of the lumen       circumference). The mass measured approximately 10 cm in length. Oozing       was present. This was biopsied with a cold forceps for histology. Area       2-3 cm distal to the mass was tattooed with circumferential injections       totally 4 mL of Spot (carbon black).      Multiple medium-mouthed diverticula were found in the sigmoid colon and       descending colon.      A large amount of stool was found in the entire colon, making       visualization difficult. No large synchronous lesions were seen (with       limitations of preparation).      The retroflexed view of the distal  rectum and anal verge was normal and       showed no anal or rectal abnormalities. 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 Biopsied. Tattooed.                           - Diverticulosis in the sigmoid colon and in the                             descending colon.                           - Stool in the entire examined colon.                           - The distal rectum and anal verge are normal on                            retroflexion view. Moderate Sedation:      N/A Recommendation:           - Return patient to hospital ward for ongoing care.                           - Clear liquid diet.                           - Await pathology.                           - Surgical consultation. Will need oncology                            referral/consult once pathology is available.                           - Monitor Hgb closely. He has received IV iron.                           - Repeat colonoscopy with complete preparation is                            recommended after recovery from surgery. Procedure Code(s):        --- Professional ---                           [phone], Colonoscopy, flexible; with directed                            submucosal injection(s), any substance  [id number], Colonoscopy, flexible; with biopsy, single                            or multiple Diagnosis Code(s):        --- Professional ---                           C19, Malignant neoplasm of rectosigmoid junction                           C18.7, Malignant neoplasm of sigmoid colon                           K56.690, Other partial intestinal obstruction                           K62.5, Hemorrhage of anus and rectum                           D50.0, Iron deficiency anemia secondary to blood                            loss (chronic)                           K57.30, Diverticulosis of large intestine without                            perforation or abscess without bleeding                           R93.3, Abnormal findings on diagnostic imaging of                            other parts of digestive tract CPT copyright 2017 American Medical Association. All rights reserved. The codes documented in this report are  preliminary and upon coder review may  be revised to meet current compliance requirements. Jerene Bears, MD 05/08/2017 10:10:50 AM This report has been signed electronically. Number of Addenda: 0

## 2017-05-08 NOTE — Progress Notes (Signed)
Spoke to Dr. Hilarie Fredrickson.  Dr. Darlyn Chamber of noncompliance with bowel prep.

## 2017-05-08 NOTE — Anesthesia Preprocedure Evaluation (Addendum)
Anesthesia Evaluation  Patient identified by MRN, date of birth, ID band Patient awake    Reviewed: Allergy & Precautions, H&P , NPO status , Patient's Chart, lab work & pertinent test results, reviewed documented beta blocker date and time   Airway Mallampati: II  TM Distance: >3 FB Neck ROM: Full    Dental no notable dental hx. (+) Partial Lower, Partial Upper, Dental Advisory Given   Pulmonary neg pulmonary ROS, former smoker,    Pulmonary exam normal breath sounds clear to auscultation       Cardiovascular hypertension, Pt. on medications and Pt. on home beta blockers + CAD, + Past MI and + Peripheral Vascular Disease   Rhythm:Regular Rate:Normal     Neuro/Psych negative neurological ROS  negative psych ROS   GI/Hepatic negative GI ROS, Neg liver ROS,   Endo/Other  diabetes, Type 2, Oral Hypoglycemic Agents  Renal/GU negative Renal ROS  negative genitourinary   Musculoskeletal   Abdominal   Peds  Hematology negative hematology ROS (+) anemia ,   Anesthesia Other Findings   Reproductive/Obstetrics negative OB ROS                            Anesthesia Physical Anesthesia Plan  ASA: III  Anesthesia Plan: MAC   Post-op Pain Management:    Induction: Intravenous  PONV Risk Score and Plan: 1 and Propofol infusion  Airway Management Planned: Simple Face Mask  Additional Equipment:   Intra-op Plan:   Post-operative Plan:   Informed Consent: I have reviewed the patients History and Physical, chart, labs and discussed the procedure including the risks, benefits and alternatives for the proposed anesthesia with the patient or authorized representative who has indicated his/her understanding and acceptance.   Dental advisory given  Plan Discussed with: CRNA  Anesthesia Plan Comments:         Anesthesia Quick Evaluation

## 2017-05-08 NOTE — Interval H&P Note (Signed)
History and Physical Interval Note: For colonoscopy today.  Pt did not tolerate the 2nd half of his prep this morning.  I was contacted by the floor RN at 5:15 and patient refused to drink any more prep.  Stools cloudy, but liquid.  Decision made to proceed with colon to try and achieve diagnosis with concern for sigmoid colon cancer.  Will attempt full colonoscopy if possible to exclude synchronous lesions.  The nature of the procedure, as well as the risks, benefits, and alternatives were carefully and thoroughly reviewed with the patient. Ample time for discussion and questions allowed. The patient understood, was satisfied, and agreed to proceed.   Post-transfusion Hgb is pending (below values are before transfusion)  CBC Latest Ref Rng & Units 05/07/2017 05/07/2017 05/06/2017  WBC 4.0 - 10.5 K/uL 8.2 8.1 10.8(H)  Hemoglobin 13.0 - 17.0 g/dL 7.5(L) 6.2(LL) 7.0(L)  Hematocrit 39.0 - 52.0 % 26.8(L) 21.7(L) 25.8(L)  Platelets 150 - 400 K/uL 335 355 414(H)      05/08/2017 9:13 AM  Jesse Le  has presented today for surgery, with the diagnosis of hematochezia, IDA  The various methods of treatment have been discussed with the patient and family. After consideration of risks, benefits and other options for treatment, the patient has consented to  Procedure(s): COLONOSCOPY WITH PROPOFOL (N/A) as a surgical intervention .  The patient's history has been reviewed, patient examined, no change in status, stable for surgery.  I have reviewed the patient's chart and labs.  Questions were answered to the patient's satisfaction.     Lajuan Lines Zarianna Dicarlo

## 2017-05-08 NOTE — Anesthesia Procedure Notes (Signed)
Procedure Name: MAC Date/Time: 05/08/2017 9:29 AM Performed by: Neldon Newport, CRNA Pre-anesthesia Checklist: Timeout performed, Suction available, Patient being monitored, Emergency Drugs available and Patient identified Oxygen Delivery Method: Simple face mask

## 2017-05-08 NOTE — Progress Notes (Signed)
Bowel prep 100g prepared and patient started to drink the fluid.  Patient was educated on the importance of completing the prep.  Patient once again was not happy with explanation given.

## 2017-05-08 NOTE — Progress Notes (Signed)
See colonoscopy report Sigmoid tumor; biopsied. I called general surgery consult this morning.  Appreciate their help. 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

## 2017-05-09 LAB — GLUCOSE, CAPILLARY
Glucose-Capillary: 89 mg/dL (ref 65–99)
Glucose-Capillary: 76 mg/dL (ref 65–99)
Glucose-Capillary: 73 mg/dL (ref 65–99)
Glucose-Capillary: 86 mg/dL (ref 65–99)

## 2017-05-09 LAB — BASIC METABOLIC PANEL
Anion gap: 6 (ref 5–15)
CHLORIDE: 104 mmol/L (ref 101–111)
CO2: 28 mmol/L (ref 22–32)
Calcium: 7.4 mg/dL — ABNORMAL LOW (ref 8.9–10.3)
Creatinine, Ser: 1.06 mg/dL (ref 0.61–1.24)
GFR calc Af Amer: >60 mL/min (ref >60)
GFR calc non Af Amer: >60 mL/min (ref >60)
GLUCOSE: 92 mg/dL (ref 65–99)
POTASSIUM: 3.1 mmol/L — AB (ref 3.5–5.1)
Sodium: 138 mmol/L (ref 135–145)

## 2017-05-09 LAB — CBC
HEMATOCRIT: 25.5 % — AB (ref 39.0–52.0)
HEMOGLOBIN: 7.1 g/dL — AB (ref 13.0–17.0)
MCH: 20.2 pg — ABNORMAL LOW (ref 26.0–34.0)
MCHC: 27.8 g/dL — AB (ref 30.0–36.0)
MCV: 72.6 fL — AB (ref 78.0–100.0)
Platelets: 347 10*3/uL (ref 150–400)
RBC: 3.51 MIL/uL — ABNORMAL LOW (ref 4.22–5.81)
RDW: 22.7 % — AB (ref 11.5–15.5)
WBC: 7.2 10*3/uL (ref 4.0–10.5)

## 2017-05-09 LAB — PREPARE RBC (CROSSMATCH)

## 2017-05-09 LAB — CEA: CEA: 24.6 ng/mL — ABNORMAL HIGH (ref 0.0–4.7)

## 2017-05-09 MED ORDER — POTASSIUM CHLORIDE 10 MEQ/100ML IV SOLN
10.0000 meq | INTRAVENOUS | Status: AC
  Administered 2017-05-09 (×4): 10 meq via INTRAVENOUS
  Filled 2017-05-09 (×4): qty 100

## 2017-05-09 MED ORDER — POLYETHYLENE GLYCOL 3350 17 G PO PACK: 17.0000 g | PACK | Freq: Every day | ORAL | Status: DC

## 2017-05-09 MED ORDER — SODIUM CHLORIDE 0.9 % IV SOLN
Freq: Once | INTRAVENOUS | Status: AC
  Administered 2017-05-09: 09:00:00 via INTRAVENOUS

## 2017-05-09 MED ORDER — POTASSIUM CHLORIDE CRYS ER 20 MEQ PO TBCR
40.0000 meq | EXTENDED_RELEASE_TABLET | Freq: Once | ORAL | Status: AC
  Administered 2017-05-09: 40 meq via ORAL
  Filled 2017-05-09: qty 2

## 2017-05-09 NOTE — Progress Notes (Signed)
[facility] Progress Note  1 Day Post-Op  Subjective: CC: pain in left arm Patient complaining of pain in left arm at IV site, K running. Complains that he wants something to eat. Denies nausea, bloating. States he is not passing flatus, but had a large soft BM this AM. Did not note if there was any blood in his stool. Denies chest pain, SOB.   Objective: Vital signs in last 24 hours: Temp:  [97.7 F (36.5 C)-99.3 F (37.4 C)] 99.2 F (37.3 C) (04/29 0004) Pulse Rate:  [69-90] 69 (04/29 0004) Resp:  [10-18] 16 (04/29 0004) BP: (132-204)/(65-87) 132/75 (04/29 0004) SpO2:  [97 %-100 %] 99 % (04/29 0004) Last BM Date: 05/08/17  Intake/Output from previous day: 04/28 0701 - 04/29 0700 In: 860 [P.O.:120; I.V.:440; IV Piggyback:300] Out: 2 [Blood:2] Intake/Output this shift: No intake/output data recorded.  PE: Gen:  Alert, NAD, cooperative Card:  Regular rate and rhythm, pedal pulses 2+ BL Pulm:  Normal effort, clear to auscultation bilaterally Abd: Soft, mildly TTP in LLQ, mildly distended, bowel sounds present Skin: warm and dry, no rashes  Psych: A&Ox3   Lab Results:  Recent Labs    05/08/17 1132 05/09/17 0151  WBC 8.5 7.2  HGB 7.4* 7.1*  HCT 26.6* 25.5*  PLT 351 347   BMET Recent Labs    05/08/17 1132 05/09/17 0151  NA 138 138  K 2.7* 3.1*  CL 101 104  CO2 27 28  GLUCOSE 82 92  BUN <5* <5*  CREATININE 0.96 1.06  CALCIUM 7.7* 7.4*   PT/INR No results for input(s): LABPROT, INR in the last 72 hours. CMP     Component Value Date/Time   NA 138 05/09/2017 0151   K 3.1 (L) 05/09/2017 0151   CL 104 05/09/2017 0151   CO2 28 05/09/2017 0151   GLUCOSE 92 05/09/2017 0151   BUN <5 (L) 05/09/2017 0151   CREATININE 1.06 05/09/2017 0151   CALCIUM 7.4 (L) 05/09/2017 0151   PROT 6.7 05/07/2017 0332   ALBUMIN 2.2 (L) 05/07/2017 0332   AST 14 (L) 05/07/2017 0332   ALT 7 (L) 05/07/2017 0332   ALKPHOS 44 05/07/2017 0332   BILITOT 1.2 05/07/2017 0332    GFRNONAA >60 05/09/2017 0151   GFRAA >60 05/09/2017 0151   Lipase     Component Value Date/Time   LIPASE 22 05/06/2017 1036       Studies/Results: No results found.  Anti-infectives: Anti-infectives (From admission, onward)   None       Assessment/Plan Coronary artery disease.  History of myocardial infarction Hypertension Status post thoracic  aortic dissection and stent placement  status post bilateral iliac stent placements COPD CKD II NIDDM Chronic anemia- hgb 7.1, 1 unit PRBC to be transfused today Obesity Hypokalemia - K 3.1, replace  Reducible umbilical hernia Rectosigmoid colon mass  - CEA pending - seen on CT and colonoscopy - bx path pending - will need cardiac and medical clearance - Clear liquids only, will plan for possible low anterior resection and repair of umbilical hernia later this week with modified bowel prep pending medical clearance  FEN: CLD VTE: SCDs ID: none     LOS: 3 days    Brigid Re , [facility] 05/09/2017, 8:15 AM Pager: [phone] Consults: [phone] Mon-Fri 7:00 am-4:30 pm Sat-Sun 7:00 am-11:30 am

## 2017-05-09 NOTE — Progress Notes (Signed)
Subjective:  Patient denies any chest pain or shortness of breath. Denies abdominal pain.  Objective:  Vital Signs in the last 24 hours: Temp:  [98 F (36.7 C)-99.3 F (37.4 C)] 98 F (36.7 C) (04/29 1059) Pulse Rate:  [69-90] 71 (04/29 1059) Resp:  [16-24] 16 (04/29 1059) BP: (132-160)/(67-85) 146/84 (04/29 1059) SpO2:  [99 %-100 %] 100 % (04/29 1059)  Intake/Output from previous day: 04/28 0701 - 04/29 0700 In: 860 [P.O.:120; I.V.:440; IV Piggyback:300] Out: 2 [Blood:2] Intake/Output from this shift: No intake/output data recorded.  Physical Exam: Neck: no adenopathy, no carotid bruit, no JVD and supple, symmetrical, trachea midline Lungs: clear to auscultation bilaterally Heart: regular rate and rhythm, S1, S2 normal and Soft systolic murmur noted Abdomen: Soft mildly distended mild left lower quadrant tenderness noted umbilical hernia noted Extremities: extremities normal, atraumatic, no cyanosis or edema  Lab Results: Recent Labs    05/08/17 1132 05/09/17 0151  WBC 8.5 7.2  HGB 7.4* 7.1*  PLT 351 347   Recent Labs    05/08/17 1132 05/09/17 0151  NA 138 138  K 2.7* 3.1*  CL 101 104  CO2 27 28  GLUCOSE 82 92  BUN <5* <5*  CREATININE 0.96 1.06   No results for input(s): TROPONINI in the last 72 hours.  Invalid input(s): CK, MB Hepatic Function Panel Recent Labs    05/07/17 0332  PROT 6.7  ALBUMIN 2.2*  AST 14*  ALT 7*  ALKPHOS 44  BILITOT 1.2   No results for input(s): CHOL in the last 72 hours. No results for input(s): PROTIME in the last 72 hours.  Imaging: Imaging results have been reviewed and No results found.  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   LOS: 3 days    Charolette Forward 05/09/2017, 11:53 AM

## 2017-05-10 ENCOUNTER — Inpatient Hospital Stay (HOSPITAL_COMMUNITY): Payer: Medicare Other

## 2017-05-10 LAB — GLUCOSE, CAPILLARY
GLUCOSE-CAPILLARY: 79 mg/dL (ref 65–99)
GLUCOSE-CAPILLARY: 121 mg/dL — AB (ref 65–99)
Glucose-Capillary: 88 mg/dL (ref 65–99)
Glucose-Capillary: 112 mg/dL — ABNORMAL HIGH (ref 65–99)

## 2017-05-10 LAB — ECHOCARDIOGRAM COMPLETE
Height: 73 in
Weight: 3343.9373 oz

## 2017-05-10 LAB — BASIC METABOLIC PANEL
Anion gap: 8 (ref 5–15)
CHLORIDE: 103 mmol/L (ref 101–111)
CO2: 24 mmol/L (ref 22–32)
CREATININE: 1.06 mg/dL (ref 0.61–1.24)
Calcium: 7.7 mg/dL — ABNORMAL LOW (ref 8.9–10.3)
GFR calc Af Amer: >60 mL/min (ref >60)
GFR calc non Af Amer: >60 mL/min (ref >60)
Glucose, Bld: 80 mg/dL (ref 65–99)
POTASSIUM: 3.8 mmol/L (ref 3.5–5.1)
SODIUM: 135 mmol/L (ref 135–145)

## 2017-05-10 LAB — TYPE AND SCREEN
ABO/RH(D): B POS
Antibody Screen: NEGATIVE
UNIT DIVISION: 0
Unit division: 0
Unit division: 0

## 2017-05-10 LAB — BPAM RBC
BLOOD PRODUCT EXPIRATION DATE: 201905232359
BLOOD PRODUCT EXPIRATION DATE: 201905232359
Blood Product Expiration Date: 201905232359
ISSUE DATE / TIME: 201904291037
ISSUE DATE / TIME: 201904261401
ISSUE DATE / TIME: 201904270823
UNIT TYPE AND RH: 7300
Unit Type and Rh: 7300
Unit Type and Rh: 7300

## 2017-05-10 LAB — CBC
HCT: 28.7 % — ABNORMAL LOW (ref 39.0–52.0)
HEMOGLOBIN: 8.1 g/dL — AB (ref 13.0–17.0)
MCH: 20.9 pg — AB (ref 26.0–34.0)
MCHC: 28.2 g/dL — AB (ref 30.0–36.0)
MCV: 74.2 fL — AB (ref 78.0–100.0)
Platelets: 309 10*3/uL (ref 150–400)
RBC: 3.87 MIL/uL — AB (ref 4.22–5.81)
RDW: 23.1 % — ABNORMAL HIGH (ref 11.5–15.5)
WBC: 8.8 10*3/uL (ref 4.0–10.5)

## 2017-05-10 LAB — MAGNESIUM: MAGNESIUM: 1.1 mg/dL — AB (ref 1.7–2.4)

## 2017-05-10 MED ORDER — POLYETHYLENE GLYCOL 3350 17 G PO PACK
17.0000 g | PACK | Freq: Two times a day (BID) | ORAL | Status: DC
  Administered 2017-05-10: 17 g via ORAL
  Filled 2017-05-10: qty 1

## 2017-05-10 MED ORDER — POTASSIUM CHLORIDE ER 10 MEQ PO TBCR
20.0000 meq | EXTENDED_RELEASE_TABLET | Freq: Two times a day (BID) | ORAL | Status: DC
  Administered 2017-05-10 – 2017-05-11 (×4): 20 meq via ORAL
  Filled 2017-05-10 (×9): qty 2

## 2017-05-10 MED ORDER — PERFLUTREN LIPID MICROSPHERE
1.0000 mL | INTRAVENOUS | Status: AC | PRN
  Administered 2017-05-10: 2 mL via INTRAVENOUS
  Filled 2017-05-10: qty 10

## 2017-05-10 MED ORDER — CEFOTETAN DISODIUM-DEXTROSE 2-2.08 GM-%(50ML) IV SOLR
2.0000 g | INTRAVENOUS | Status: AC
  Administered 2017-05-11: 2 g via INTRAVENOUS
  Filled 2017-05-10: qty 50

## 2017-05-10 MED ORDER — MAGNESIUM SULFATE 4 GM/100ML IV SOLN
4.0000 g | Freq: Once | INTRAVENOUS | Status: AC
  Administered 2017-05-10: 4 g via INTRAVENOUS
  Filled 2017-05-10: qty 100

## 2017-05-10 NOTE — Consult Note (Signed)
[place] Nurse requested for preoperative stoma site marking  Discussed surgical procedure and stoma creation with patient and family.  Explained role of the [place] nurse team.  Provided the patient with educational booklet and provided samples of pouching options.  Answered patient and family questions.   Examined patient lying, sitting, in order to place the marking in the patient's visual field, away from any creases or abdominal contour issues and within the rectus muscle.  Patient with large umbilical hernia, marked to avoid this site  Marked for colostomy in the LLQ  __5__ cm to the left of the umbilicus and _5___KP above/below the umbilicus.  Marked for ileostomy in the RLQ  __5.5__cm to the right of the umbilicus and  __5__ cm below the umbilicus.   Patient's abdomen cleansed with CHG wipes at site markings, allowed to air dry prior to marking.Covered mark with thin film transparent dressing to preserve mark until date of surgery.   [facility] Nurse team will follow up with patient after surgery for continue ostomy care and teaching.  [place] MSN, [place], [place], [place]

## 2017-05-10 NOTE — Care Management Important Message (Signed)
Important Message  Patient Details  Name: Jesse Le MRN: [id number] Date of Birth: 02-17-1945   Medicare Important Message Given:  Yes    [name] 05/10/2017, 2:38 PM

## 2017-05-10 NOTE — Progress Notes (Signed)
  Echocardiogram 2D Echocardiogram has been performed.  Jesse Le 05/10/2017, 11:31 AM

## 2017-05-10 NOTE — Progress Notes (Addendum)
Subjective:  Patient denies any chest pain or shortness of breath states abdominal pain is improved.surgical pathology report pending CEA markedly elevated. 2-D echo not done yet  Objective:  Vital Signs in the last 24 hours: Temp:  [97.6 F (36.4 C)-98.8 F (37.1 C)] 97.6 F (36.4 C) (04/30 0808) Pulse Rate:  [70-79] 79 (04/30 0808) Resp:  [2-24] 2 (04/30 0808) BP: (141-176)/(70-87) 175/87 (04/30 0808) SpO2:  [98 %-100 %] 99 % (04/30 0808)  Intake/Output from previous day: 04/29 0701 - 04/30 0700 In: 961.2 [P.O.:417; I.V.:240; Blood:304.2] Out: 1230 [Urine:1230] Intake/Output from this shift: No intake/output data recorded.  Physical Exam: Neck: no adenopathy, no carotid bruit, no JVD and supple, symmetrical, trachea midline Lungs: clear to auscultation bilaterally Heart: regular rate and rhythm, S1, S2 normal and Soft systolic murmur noted Abdomen: soft, non-tender; bowel sounds normal; no masses,  no organomegaly Extremities: extremities normal, atraumatic, no cyanosis or edema  Lab Results: Recent Labs    05/09/17 0151 05/10/17 0303  WBC 7.2 8.8  HGB 7.1* 8.1*  PLT 347 309   Recent Labs    05/09/17 0151 05/10/17 0303  NA 138 135  K 3.1* 3.8  CL 104 103  CO2 28 24  GLUCOSE 92 80  BUN <5* <5*  CREATININE 1.06 1.06   No results for input(s): TROPONINI in the last 72 hours.  Invalid input(s): CK, MB Hepatic Function Panel No results for input(s): PROT, ALBUMIN, AST, ALT, ALKPHOS, BILITOT, BILIDIR, IBILI in the last 72 hours. No results for input(s): CHOL in the last 72 hours. No results for input(s): PROTIME in the last 72 hours.  Imaging: Imaging results have been reviewed and No results found.  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 Patient acceptable risk for resection of rectosigmoid adenocarcinoma/colostomy and umbilical hernia repair.  from cardiac point of view. Will need perioperative packed RBC transfusion  LOS: 4 days    Charolette Forward 05/10/2017, 9:02 AM

## 2017-05-10 NOTE — Progress Notes (Signed)
[facility] Progress Note  2 Days Post-Op  Subjective: CC: No new complaints - Having diarrhea with bloody tinge  2D echo pending  Objective: Vital signs in last 24 hours: Temp:  [97.6 F (36.4 C)-98.8 F (37.1 C)] 97.6 F (36.4 C) (04/30 0808) Pulse Rate:  [70-79] 79 (04/30 0808) Resp:  [2-24] 2 (04/30 0808) BP: (141-176)/(70-87) 175/87 (04/30 0808) SpO2:  [98 %-100 %] 99 % (04/30 0808) Last BM Date: 05/09/17  Intake/Output from previous day: 04/29 0701 - 04/30 0700 In: 961.2 [P.O.:417; I.V.:240; Blood:304.2] Out: 1230 [Urine:1230] Intake/Output this shift: No intake/output data recorded.  PE: Gen:  Alert, NAD, pleasant Card:  Regular rate and rhythm, pedal pulses 2+ BL Pulm:  Normal effort, clear to auscultation bilaterally Abd: Soft, non-tender, protruding umbilical hernia - reducible; non-distended, bowel sounds present in all 4 quadrants, no HSM, incisions C/D/I Skin: warm and dry, no rashes  Psych: A&Ox3   Lab Results:  Recent Labs    05/09/17 0151 05/10/17 0303  WBC 7.2 8.8  HGB 7.1* 8.1*  HCT 25.5* 28.7*  PLT 347 309   BMET Recent Labs    05/09/17 0151 05/10/17 0303  NA 138 135  K 3.1* 3.8  CL 104 103  CO2 28 24  GLUCOSE 92 80  BUN <5* <5*  CREATININE 1.06 1.06  CALCIUM 7.4* 7.7*   PT/INR No results for input(s): LABPROT, INR in the last 72 hours. CMP     Component Value Date/Time   NA 135 05/10/2017 0303   K 3.8 05/10/2017 0303   CL 103 05/10/2017 0303   CO2 24 05/10/2017 0303   GLUCOSE 80 05/10/2017 0303   BUN <5 (L) 05/10/2017 0303   CREATININE 1.06 05/10/2017 0303   CALCIUM 7.7 (L) 05/10/2017 0303   PROT 6.7 05/07/2017 0332   ALBUMIN 2.2 (L) 05/07/2017 0332   AST 14 (L) 05/07/2017 0332   ALT 7 (L) 05/07/2017 0332   ALKPHOS 44 05/07/2017 0332   BILITOT 1.2 05/07/2017 0332   GFRNONAA >60 05/10/2017 0303   GFRAA >60 05/10/2017 0303   Lipase     Component Value Date/Time   LIPASE 22 05/06/2017 1036        Studies/Results: No results found.  Anti-infectives: Anti-infectives (From admission, onward)   None       Assessment/Plan Coronary artery disease. History of myocardial infarction Hypertension Status post thoracic aortic dissection and stent placement  status post bilateral iliac stent placements COPD CKD II NIDDM Chronic anemia- hgb 8.1, s/p 1 unit PRBC yesterday Obesity Hypokalemia - improved  Reducible umbilical hernia Rectosigmoid colon mass - CEA elevated at 24 - seen on CT and colonoscopy - bx path pending - echo to be done today - Clear liquids only, miralax BID today - will plan for OR tomorrow pending cardiac clearance  FEN: CLD VTE: SCDs ID: none    LOS: 4 days    Brigid Re , [facility] 05/10/2017, 9:54 AM Pager: [phone] Consults: [phone] Mon-Fri 7:00 am-4:30 pm Sat-Sun 7:00 am-11:30 am

## 2017-05-11 ENCOUNTER — Encounter (HOSPITAL_COMMUNITY)
Admission: EM | Disposition: A | Discharge status: Skilled Nursing Facility | Payer: Self-pay | Source: Home / Self Care | Attending: Cardiology

## 2017-05-11 ENCOUNTER — Encounter (HOSPITAL_COMMUNITY): Payer: Self-pay | Admitting: Anesthesiology

## 2017-05-11 ENCOUNTER — Inpatient Hospital Stay (HOSPITAL_COMMUNITY): Payer: Medicare Other | Admitting: Anesthesiology

## 2017-05-11 HISTORY — PX: UMBILICAL HERNIA REPAIR: SHX196

## 2017-05-11 HISTORY — PX: COLON RESECTION SIGMOID: SHX6737

## 2017-05-11 HISTORY — PX: ILEOSTOMY: SHX1783

## 2017-05-11 LAB — BASIC METABOLIC PANEL
Anion gap: 5 (ref 5–15)
BUN: <5 mg/dL — ABNORMAL LOW (ref 6–20)
CALCIUM: 7.7 mg/dL — AB (ref 8.9–10.3)
CHLORIDE: 106 mmol/L (ref 101–111)
CO2: 25 mmol/L (ref 22–32)
CREATININE: 1.02 mg/dL (ref 0.61–1.24)
GFR calc Af Amer: >60 mL/min (ref >60)
GFR calc non Af Amer: >60 mL/min (ref >60)
GLUCOSE: 102 mg/dL — AB (ref 65–99)
Potassium: 3.8 mmol/L (ref 3.5–5.1)
Sodium: 136 mmol/L (ref 135–145)

## 2017-05-11 LAB — MAGNESIUM: MAGNESIUM: 1.9 mg/dL (ref 1.7–2.4)

## 2017-05-11 LAB — GLUCOSE, CAPILLARY
GLUCOSE-CAPILLARY: 77 mg/dL (ref 65–99)
GLUCOSE-CAPILLARY: 82 mg/dL (ref 65–99)
GLUCOSE-CAPILLARY: 88 mg/dL (ref 65–99)
Glucose-Capillary: 130 mg/dL — ABNORMAL HIGH (ref 65–99)
Glucose-Capillary: 157 mg/dL — ABNORMAL HIGH (ref 65–99)

## 2017-05-11 LAB — PREPARE RBC (CROSSMATCH)

## 2017-05-11 LAB — CBC
HCT: 28.2 % — ABNORMAL LOW (ref 39.0–52.0)
Hemoglobin: 8 g/dL — ABNORMAL LOW (ref 13.0–17.0)
MCH: 21.4 pg — ABNORMAL LOW (ref 26.0–34.0)
MCHC: 28.4 g/dL — AB (ref 30.0–36.0)
MCV: 75.4 fL — ABNORMAL LOW (ref 78.0–100.0)
Platelets: 296 10*3/uL (ref 150–400)
RBC: 3.74 MIL/uL — ABNORMAL LOW (ref 4.22–5.81)
RDW: 24.2 % — AB (ref 11.5–15.5)
WBC: 8.5 10*3/uL (ref 4.0–10.5)

## 2017-05-11 SURGERY — COLECTOMY, SIGMOID, OPEN
Anesthesia: General | Site: Abdomen

## 2017-05-11 MED ORDER — ENOXAPARIN SODIUM 40 MG/0.4ML ~~LOC~~ SOLN
40.0000 mg | SUBCUTANEOUS | Status: DC
  Administered 2017-05-12 – 2017-05-25 (×14): 40 mg via SUBCUTANEOUS
  Filled 2017-05-11 (×14): qty 0.4

## 2017-05-11 MED ORDER — HYDROMORPHONE HCL 2 MG/ML IJ SOLN: 0.2500 mg | INTRAMUSCULAR | Status: DC | PRN

## 2017-05-11 MED ORDER — FENTANYL CITRATE (PF) 250 MCG/5ML IJ SOLN
INTRAMUSCULAR | Status: AC
Start: 2017-05-11 — End: ?
  Filled 2017-05-11: qty 5

## 2017-05-11 MED ORDER — FENTANYL CITRATE (PF) 250 MCG/5ML IJ SOLN
INTRAMUSCULAR | Status: DC | PRN
  Administered 2017-05-11 (×6): 50 ug via INTRAVENOUS
  Administered 2017-05-11: 100 ug via INTRAVENOUS
  Administered 2017-05-11 (×2): 50 ug via INTRAVENOUS

## 2017-05-11 MED ORDER — MIDAZOLAM HCL 2 MG/2ML IJ SOLN
INTRAMUSCULAR | Status: DC | PRN
  Administered 2017-05-11 (×2): 1 mg via INTRAVENOUS

## 2017-05-11 MED ORDER — LACTATED RINGERS IV SOLN
INTRAVENOUS | Status: DC
  Administered 2017-05-11 (×3): via INTRAVENOUS

## 2017-05-11 MED ORDER — SODIUM CHLORIDE 0.9 % IV SOLN
INTRAVENOUS | Status: DC | PRN
  Administered 2017-05-11 (×2): via INTRAVENOUS

## 2017-05-11 MED ORDER — MEPERIDINE HCL 50 MG/ML IJ SOLN: 6.2500 mg | INTRAMUSCULAR | Status: DC | PRN

## 2017-05-11 MED ORDER — HYDROMORPHONE HCL 1 MG/ML IJ SOLN
1.0000 mg | INTRAMUSCULAR | Status: DC | PRN
  Administered 2017-05-11 – 2017-05-20 (×13): 1 mg via INTRAVENOUS
  Filled 2017-05-11 (×13): qty 1

## 2017-05-11 MED ORDER — MIDAZOLAM HCL 2 MG/2ML IJ SOLN
INTRAMUSCULAR | Status: AC
  Filled 2017-05-11: qty 2

## 2017-05-11 MED ORDER — METHYLENE BLUE 0.5 % INJ SOLN
INTRAVENOUS | Status: AC
  Filled 2017-05-11: qty 10

## 2017-05-11 MED ORDER — PROPOFOL 10 MG/ML IV BOLUS
INTRAVENOUS | Status: AC
  Filled 2017-05-11: qty 20

## 2017-05-11 MED ORDER — ONDANSETRON 4 MG PO TBDP: 4.0000 mg | ORAL_TABLET | Freq: Four times a day (QID) | ORAL | Status: DC | PRN

## 2017-05-11 MED ORDER — 0.9 % SODIUM CHLORIDE (POUR BTL) OPTIME
TOPICAL | Status: DC | PRN
  Administered 2017-05-11: 1000 mL

## 2017-05-11 MED ORDER — SODIUM CHLORIDE 0.9 % IV SOLN
INTRAVENOUS | Status: DC
  Administered 2017-05-11 – 2017-05-14 (×5): via INTRAVENOUS

## 2017-05-11 MED ORDER — SODIUM CHLORIDE 0.9 % IV SOLN: Freq: Once | INTRAVENOUS | Status: DC

## 2017-05-11 MED ORDER — ONDANSETRON HCL 4 MG/2ML IJ SOLN
INTRAMUSCULAR | Status: DC | PRN
  Administered 2017-05-11: 4 mg via INTRAVENOUS

## 2017-05-11 MED ORDER — FENTANYL CITRATE (PF) 250 MCG/5ML IJ SOLN
INTRAMUSCULAR | Status: AC
  Filled 2017-05-11: qty 5

## 2017-05-11 MED ORDER — PROMETHAZINE HCL 25 MG/ML IJ SOLN: 6.2500 mg | INTRAMUSCULAR | Status: DC | PRN

## 2017-05-11 MED ORDER — DEXAMETHASONE SODIUM PHOSPHATE 10 MG/ML IJ SOLN
INTRAMUSCULAR | Status: DC | PRN
  Administered 2017-05-11: 10 mg via INTRAVENOUS

## 2017-05-11 MED ORDER — DIPHENHYDRAMINE HCL 50 MG/ML IJ SOLN
12.5000 mg | Freq: Four times a day (QID) | INTRAMUSCULAR | Status: DC | PRN
  Administered 2017-05-22 – 2017-05-26 (×4): 12.5 mg via INTRAVENOUS
  Filled 2017-05-11 (×5): qty 1

## 2017-05-11 MED ORDER — ROCURONIUM BROMIDE 10 MG/ML (PF) SYRINGE
PREFILLED_SYRINGE | INTRAVENOUS | Status: DC | PRN
  Administered 2017-05-11: 20 mg via INTRAVENOUS
  Administered 2017-05-11: 50 mg via INTRAVENOUS
  Administered 2017-05-11 (×2): 10 mg via INTRAVENOUS
  Administered 2017-05-11: 20 mg via INTRAVENOUS

## 2017-05-11 MED ORDER — SODIUM CHLORIDE 0.9 % IV SOLN
2.0000 g | Freq: Two times a day (BID) | INTRAVENOUS | Status: AC
  Administered 2017-05-11: 2 g via INTRAVENOUS
  Filled 2017-05-11: qty 2

## 2017-05-11 MED ORDER — SUGAMMADEX SODIUM 200 MG/2ML IV SOLN
INTRAVENOUS | Status: DC | PRN
  Administered 2017-05-11: 200 mg via INTRAVENOUS

## 2017-05-11 MED ORDER — SODIUM CHLORIDE 0.9 % IV SOLN
Freq: Once | INTRAVENOUS | Status: AC
  Administered 2017-05-11: 09:00:00 via INTRAVENOUS

## 2017-05-11 MED ORDER — PROPOFOL 10 MG/ML IV BOLUS
INTRAVENOUS | Status: DC | PRN
  Administered 2017-05-11: 150 mg via INTRAVENOUS

## 2017-05-11 MED ORDER — ONDANSETRON HCL 4 MG/2ML IJ SOLN
4.0000 mg | Freq: Four times a day (QID) | INTRAMUSCULAR | Status: DC | PRN
  Administered 2017-05-14 – 2017-06-04 (×7): 4 mg via INTRAVENOUS
  Filled 2017-05-11 (×7): qty 2

## 2017-05-11 MED ORDER — DIPHENHYDRAMINE HCL 12.5 MG/5ML PO ELIX: 12.5000 mg | ORAL_SOLUTION | Freq: Four times a day (QID) | ORAL | Status: DC | PRN

## 2017-05-11 MED ORDER — LIDOCAINE 2% (20 MG/ML) 5 ML SYRINGE
INTRAMUSCULAR | Status: DC | PRN
  Administered 2017-05-11: 80 mg via INTRAVENOUS

## 2017-05-11 MED ORDER — METHOCARBAMOL 500 MG PO TABS: 500.0000 mg | ORAL_TABLET | Freq: Four times a day (QID) | ORAL | Status: DC | PRN

## 2017-05-11 SURGICAL SUPPLY — 81 items
APL SKNCLS STERI-STRIP NONHPOA (GAUZE/BANDAGES/DRESSINGS) ×1
BENZOIN TINCTURE PRP APPL 2/3 (GAUZE/BANDAGES/DRESSINGS) ×3 IMPLANT
BIOPATCH RED 1 DISK 7.0 (GAUZE/BANDAGES/DRESSINGS) ×1 IMPLANT
BIOPATCH RED 1IN DISK 7.0MM (GAUZE/BANDAGES/DRESSINGS) ×1
BLADE CLIPPER SURG (BLADE) ×2 IMPLANT
BLADE SURG 15 STRL LF DISP TIS (BLADE) ×1 IMPLANT
BLADE SURG 15 STRL SS (BLADE) ×3
CANISTER SUCT 3000ML PPV (MISCELLANEOUS) ×3 IMPLANT
CATH ROBINSON RED A/P 14FR (CATHETERS) ×2 IMPLANT
CHLORAPREP W/TINT 26ML (MISCELLANEOUS) ×3 IMPLANT
CLOSURE WOUND 1/2 X4 (GAUZE/BANDAGES/DRESSINGS)
COVER MAYO STAND STRL (DRAPES) ×6 IMPLANT
COVER SURGICAL LIGHT HANDLE (MISCELLANEOUS) ×3 IMPLANT
DRAIN CHANNEL 19F RND (DRAIN) ×2 IMPLANT
DRAPE HALF SHEET 40X57 (DRAPES) ×3 IMPLANT
DRAPE LAPAROSCOPIC ABDOMINAL (DRAPES) ×3 IMPLANT
DRAPE UTILITY XL STRL (DRAPES) ×3 IMPLANT
DRAPE WARM FLUID 44X44 (DRAPE) ×3 IMPLANT
DRSG OPSITE POSTOP 4X10 (GAUZE/BANDAGES/DRESSINGS) ×2 IMPLANT
DRSG OPSITE POSTOP 4X8 (GAUZE/BANDAGES/DRESSINGS) IMPLANT
DRSG TEGADERM 2-3/8X2-3/4 SM (GAUZE/BANDAGES/DRESSINGS) ×2 IMPLANT
DRSG TEGADERM 4X4.75 (GAUZE/BANDAGES/DRESSINGS) ×1 IMPLANT
ELECT BLADE 6.5 EXT (BLADE) ×3 IMPLANT
ELECT CAUTERY BLADE 6.4 (BLADE) ×6 IMPLANT
ELECT REM PT RETURN 9FT ADLT (ELECTROSURGICAL) ×3
ELECTRODE REM PT RTRN 9FT ADLT (ELECTROSURGICAL) ×1 IMPLANT
EVACUATOR SILICONE 100CC (DRAIN) ×2 IMPLANT
GAUZE SPONGE 2X2 8PLY STRL LF (GAUZE/BANDAGES/DRESSINGS) ×1 IMPLANT
GAUZE SPONGE 4X4 16PLY XRAY LF (GAUZE/BANDAGES/DRESSINGS) ×1 IMPLANT
GLOVE BIO SURGEON STRL SZ7 (GLOVE) ×6 IMPLANT
GLOVE BIOGEL PI IND STRL 7.5 (GLOVE) ×2 IMPLANT
GLOVE BIOGEL PI INDICATOR 7.5 (GLOVE) ×4
GOWN STRL REUS W/ TWL LRG LVL3 (GOWN DISPOSABLE) ×6 IMPLANT
GOWN STRL REUS W/TWL LRG LVL3 (GOWN DISPOSABLE) ×18
KIT BASIN OR (CUSTOM PROCEDURE TRAY) ×3 IMPLANT
KIT OSTOMY DRAINABLE 2.75 STR (WOUND CARE) ×2 IMPLANT
KIT TURNOVER KIT B (KITS) ×3 IMPLANT
LEGGING LITHOTOMY PAIR STRL (DRAPES) ×3 IMPLANT
LIGASURE IMPACT 36 18CM CVD LR (INSTRUMENTS) ×2 IMPLANT
NDL HYPO 25GX1X1/2 BEV (NEEDLE) ×1 IMPLANT
NEEDLE HYPO 25GX1X1/2 BEV (NEEDLE) ×3 IMPLANT
NS IRRIG 1000ML POUR BTL (IV SOLUTION) ×6 IMPLANT
PACK GENERAL/GYN (CUSTOM PROCEDURE TRAY) ×3 IMPLANT
PACK SURGICAL SETUP 50X90 (CUSTOM PROCEDURE TRAY) ×3 IMPLANT
PAD ARMBOARD 7.5X6 YLW CONV (MISCELLANEOUS) ×3 IMPLANT
PENCIL BUTTON HOLSTER BLD 10FT (ELECTRODE) ×3 IMPLANT
SPECIMEN JAR LARGE (MISCELLANEOUS) ×3 IMPLANT
SPONGE GAUZE 2X2 STER 10/PKG (GAUZE/BANDAGES/DRESSINGS)
SPONGE LAP 18X18 X RAY DECT (DISPOSABLE) IMPLANT
STAPLER AUT SUT 4.8 EEAXL 31 (STAPLE) ×2 IMPLANT
STAPLER CUT CVD 40MM GREEN (STAPLE) ×2 IMPLANT
STAPLER PROXIMATE 75MM BLUE (STAPLE) ×2 IMPLANT
STAPLER VISISTAT 35W (STAPLE) ×3 IMPLANT
STRIP CLOSURE SKIN 1/2X4 (GAUZE/BANDAGES/DRESSINGS) ×1 IMPLANT
SUCTION POOLE TIP (SUCTIONS) ×3 IMPLANT
SURGILUBE 2OZ TUBE FLIPTOP (MISCELLANEOUS) ×3 IMPLANT
SUT ETHILON 2 0 FS 18 (SUTURE) ×4 IMPLANT
SUT MNCRL AB 4-0 PS2 18 (SUTURE) ×1 IMPLANT
SUT NOVA NAB DX-16 0-1 5-0 T12 (SUTURE) ×1 IMPLANT
SUT NOVA NAB GS-21 0 18 T12 DT (SUTURE) ×1 IMPLANT
SUT PDS AB 1 TP1 96 (SUTURE) ×6 IMPLANT
SUT PROLENE 2 0 CT2 30 (SUTURE) ×4 IMPLANT
SUT PROLENE 2 0 KS (SUTURE) IMPLANT
SUT SILK 2 0 SH CR/8 (SUTURE) ×3 IMPLANT
SUT SILK 2 0 TIES 10X30 (SUTURE) ×3 IMPLANT
SUT SILK 3 0 SH CR/8 (SUTURE) ×3 IMPLANT
SUT SILK 3 0 TIES 10X30 (SUTURE) ×3 IMPLANT
SUT VIC AB 3-0 SH 18 (SUTURE) ×2 IMPLANT
SUT VIC AB 3-0 SH 27 (SUTURE)
SUT VIC AB 3-0 SH 27X BRD (SUTURE) ×1 IMPLANT
SYR BULB 3OZ (MISCELLANEOUS) ×3 IMPLANT
SYR BULB IRRIGATION 50ML (SYRINGE) ×3 IMPLANT
SYR CONTROL 10ML LL (SYRINGE) ×3 IMPLANT
TOWEL OR 17X24 6PK STRL BLUE (TOWEL DISPOSABLE) ×3 IMPLANT
TOWEL OR 17X26 10 PK STRL BLUE (TOWEL DISPOSABLE) ×6 IMPLANT
TRAY FOLEY MTR SLVR 16FR STAT (SET/KITS/TRAYS/PACK) ×3 IMPLANT
TRAY PROCTOSCOPIC FIBER OPTIC (SET/KITS/TRAYS/PACK) ×3 IMPLANT
TUBE CONNECTING 12'X1/4 (SUCTIONS) ×1
TUBE CONNECTING 12X1/4 (SUCTIONS) ×2 IMPLANT
UNDERPAD 30X30 (UNDERPADS AND DIAPERS) ×3 IMPLANT
YANKAUER SUCT BULB TIP NO VENT (SUCTIONS) ×3 IMPLANT

## 2017-05-11 NOTE — Progress Notes (Signed)
Patient returned to floor from PACU. Vital signs stable. Resting comfortably.

## 2017-05-11 NOTE — Transfer of Care (Signed)
Immediate Anesthesia Transfer of Care Note  Patient: Jesse Le  Procedure(s) Performed: Colon Resection Sigmoid Low Anterior Resection with Staple Colon Relast (N/A Abdomen) HERNIA REPAIR UMBILICAL ADULT (N/A Abdomen) Creation Vertical Loop ILEOSTOMY (N/A Abdomen)  Patient Location: PACU  Anesthesia Type:General  Level of Consciousness: awake, alert  and patient cooperative  Airway & Oxygen Therapy: Patient Spontanous Breathing  Post-op Assessment: Report given to RN and Post -op Vital signs reviewed and stable  Post vital signs: Reviewed and stable  Last Vitals:  Vitals Value Taken Time  BP 175/97 05/11/2017  5:58 PM  Temp 36.5 C 05/11/2017  5:58 PM  Pulse 79 05/11/2017  5:59 PM  Resp 22 05/11/2017  5:59 PM  SpO2 92 % 05/11/2017  5:59 PM  Vitals shown include unvalidated device data.  Last Pain:  Vitals:   05/11/17 1758  TempSrc:   PainSc: (P) 8       Patients Stated Pain Goal: 0 (22/33/61 2244)  Complications: No apparent anesthesia complications

## 2017-05-11 NOTE — Anesthesia Procedure Notes (Signed)
Procedure Name: Intubation Date/Time: 05/11/2017 2:13 PM Performed by: Clearnce Sorrel, CRNA Pre-anesthesia Checklist: Patient identified, Emergency Drugs available, Patient being monitored, Timeout performed and Suction available Patient Re-evaluated:Patient Re-evaluated prior to induction Oxygen Delivery Method: Circle system utilized Preoxygenation: Pre-oxygenation with 100% oxygen Induction Type: IV induction Ventilation: Mask ventilation without difficulty and Oral airway inserted - appropriate to patient size Laryngoscope Size: Mac and 4 Grade View: Grade I Tube type: Oral Tube size: 7.5 mm Number of attempts: 1 Airway Equipment and Method: Stylet Placement Confirmation: ETT inserted through vocal cords under direct vision,  positive ETCO2 and breath sounds checked- equal and bilateral Secured at: 23 cm Tube secured with: Tape Dental Injury: Teeth and Oropharynx as per pre-operative assessment

## 2017-05-11 NOTE — Anesthesia Preprocedure Evaluation (Signed)
Anesthesia Evaluation  Patient identified by MRN, date of birth, ID band Patient awake    Reviewed: Allergy & Precautions, H&P , NPO status , Patient's Chart, lab work & pertinent test results, reviewed documented beta blocker date and time   Airway Mallampati: II  TM Distance: >3 FB Neck ROM: Full    Dental no notable dental hx. (+) Partial Lower, Partial Upper, Dental Advisory Given   Pulmonary neg pulmonary ROS, former smoker,    Pulmonary exam normal breath sounds clear to auscultation       Cardiovascular hypertension, Pt. on medications and Pt. on home beta blockers + CAD, + Past MI and + Peripheral Vascular Disease   Rhythm:Regular Rate:Normal     Neuro/Psych negative neurological ROS  negative psych ROS   GI/Hepatic Neg liver ROS, Colon Ca   Endo/Other  diabetes, Type 2, Oral Hypoglycemic Agents  Renal/GU negative Renal ROS  negative genitourinary   Musculoskeletal Umbilical hernia   Abdominal   Peds  Hematology negative hematology ROS (+) anemia ,   Anesthesia Other Findings   Reproductive/Obstetrics negative OB ROS                             Anesthesia Physical  Anesthesia Plan  ASA: III  Anesthesia Plan: General   Post-op Pain Management:    Induction: Intravenous  PONV Risk Score and Plan: 4 or greater and Ondansetron, Dexamethasone, Treatment may vary due to age or medical condition and Midazolam  Airway Management Planned: Oral ETT  Additional Equipment:   Intra-op Plan:   Post-operative Plan: Extubation in OR  Informed Consent: I have reviewed the patients History and Physical, chart, labs and discussed the procedure including the risks, benefits and alternatives for the proposed anesthesia with the patient or authorized representative who has indicated his/her understanding and acceptance.   Dental advisory given  Plan Discussed with: CRNA,  Anesthesiologist and Surgeon  Anesthesia Plan Comments:         Anesthesia Quick Evaluation

## 2017-05-11 NOTE — Op Note (Signed)
Preop diagnosis: Rectosigmoid adenocarcinoma, large reducible umbilical hernia Postop diagnosis: Same Procedure performed: Low anterior resection with primary colorectal anastomosis and diverting loop ileostomy, rigid proctoscopy, umbilical hernia repair Surgeon:Arabela Basaldua K Jaron Czarnecki Assistant: Dr. Christie Beckers Anesthesia: General endotracheal Indications: This is a 72 year old male with multiple medical issues who presented with anemia and a large sigmoid colon mass seen on CT scan.  Colonoscopy showed a rectosigmoid cancer that was quite large measuring about 10 cm in length.  CEA level is elevated.  He presents now for resection.  He was able to undergo bowel prep prior to surgery.  Description of procedure: The patient is brought to the operating room and placed in the supine position on the operating room table.  After an adequate level of general anesthesia was obtained, a Foley catheter was placed under sterile technique.  The patient's legs were placed in lithotomy position in yellowfin stirrups.  His perineum was prepped with Betadine and his abdomen was prepped with ChloraPrep and draped in sterile fashion.  A timeout was taken to ensure the proper patient and proper procedure.  We made a curvilinear incision around the patient's large umbilical hernia.  We dissected down around the hernia sac to the fascia.  The hernia sac was opened.  This contained a lot of omentum but no bowel.  We opened the fascial defect water.  We extended our incision inferiorly in the midline.  We entered the peritoneal cavity through the umbilical hernia.  We extended our incision superiorly as well to provide adequate exposure.  We excised the umbilical skin as it was thin and ischemic.  The hernia sac was sent for pathologic examination.  We placed a Balfour retractor to try to provide some exposure.  The patient small bowel appears to be normal.  We packed this way in the upper abdomen along with the omentum.  Patient has  a fairly redundant proximal sigmoid colon.  We palpated a very large mass in the distal sigmoid colon that was densely adherent to the posterior surface of the bladder.  The proximal sigmoid colon is slightly dilated but appears viable.  No other masses are palpated within the colon.  We had considerable difficulty mobilizing the mass away from the bladder.  We dissected between the bladder and the tumor with blunt dissection.  We used the GIA-75 stapler to divide the sigmoid colon about 10 cm proximal to the proximal extent of the tumor.  We then used the LigaSure device to start dividing the mesocolon.  We dissected down the right side of the pelvis until it appeared that we were distal to the tumor.  We could identify the tattoo ink placed by the endoscopist.  Patient has very thick hard adhesions in the mesentery to the left.  We had considerable difficulty dissecting through this area.  We were finally able to mobilize the colon completely to a soft area below the tumor.  We divided the colon distal to the tumor with a green contour stapler.  The remainder of the mesentery was divided with the LigaSure device.  Several larger vessels were oversewn with 2-0 silk.  The specimen was sent for pathologic examination.  We irrigated the wound thoroughly and inspected for hemostasis.  We were able to identify the left ureter which appeared to be intact.  The bladder seem to be intact.  We examined the pelvis carefully for hemostasis.  We then examined our proximal staple line of the mid sigmoid colon.  This reached down into the  pelvis very easily and required no further mobilization.  The staple line was amputated and we created a pursestring suture with 2-0 Prolene.  We used a 31 mm Covidien circular stapler.  The anvil was secured with the pursestring suture in the proximal stump.  We used an additional 2-0 silk to tighten the pursestring suture.  I then went below and dilated this ends up to 3 fingers.  I passed  the EEA sizer up into the rectum.  The distal staple line seems to be only about 6 cm from the anal verge.  The size it was removed.  We then passed the stapler to the end of the rectal stump.  The stapler spike was advanced just posterior to the staple line.  This was connected with the anvil.  We then tightened down the stapler and fired the stapler.  The stapler was removed.  2 intact donuts of tissue were removed.  The distal donut was sent as distal margin.  We then filled the pelvis with saline.  I inserted the rigid proctoscope.  No bleeding was noted at the staple line.  However when we insufflated air there was a air leak posteriorly in the anastomosis.  We were not able to visualize this portion of the anastomosis as this is very low.  We made the decision to divert him with a loop ileostomy.  We changed gown and gloves.  A 19 French drain was brought in through a stab incision on the left and placed in the pelvis posterior to the anastomosis.  This was secured with 2-0 nylon.  The entire abdomen was thoroughly irrigated first with sterile water and then with sterile saline.  Hemostasis was good.  We had an accurate sponge count.  We selected a loop of the terminal ileum about 30 cm proximal to the ileocecal valve.  We brought this up through a round incision on the right side.  A 14 French red rubber catheter was used as a bridge under the loop ileostomy.  This was secured with 2-0 nylon.  The fascia was then reapproximated with double-stranded #1 PDS suture.  This fascial closure closed to the size of the umbilical defect as incorporated with our entire fascial opening.  The subcutaneous tissues were irrigated and staples were used to close the skin.  A honeycomb dressing was applied.  The ileostomy was then matured with 3-0 Vicryl sutures.  An ostomy appliance was placed.  The drain was placed to bulb suction.  The patient was then extubated and brought to the recovery room in stable condition.  All  sponge, instrument, and needle counts are correct.  Imogene Burn. Georgette Dover, MD, [facility] Surgery  General/ Trauma Surgery  05/11/2017 6:05 PM

## 2017-05-11 NOTE — Progress Notes (Signed)
[facility] Progress Note  3 Days Post-Op  Subjective: CC: wants to eat Patient grumpy this AM about not being able to eat anything. Discussed pathology results from bx and surgery today.  Objective: Vital signs in last 24 hours: Temp:  [98.6 F (37 C)-98.9 F (37.2 C)] 98.9 F (37.2 C) (05/01 0054) Pulse Rate:  [68-76] 68 (05/01 0054) Resp:  [18] 18 (04/30 1639) BP: (138-155)/(72-87) 138/72 (05/01 0054) SpO2:  [99 %] 99 % (05/01 0054) Last BM Date: 05/10/17  Intake/Output from previous day: 04/30 0701 - 05/01 0700 In: 329.5 [P.O.:240; I.V.:89.5] Out: 1100 [Urine:1100] Intake/Output this shift: No intake/output data recorded.  PE: Gen:  Alert, NAD Card:  Regular rate and rhythm, pedal pulses 2+ BL Pulm:  Normal effort, clear to auscultation bilaterally Abd: Soft, non-tender, protruding umbilical hernia - reducible; non-distended, bowel sounds present in all 4 quadrants, no HSM Skin: warm and dry, no rashes  Psych: A&Ox3   Lab Results:  Recent Labs    05/10/17 0303 05/11/17 0352  WBC 8.8 8.5  HGB 8.1* 8.0*  HCT 28.7* 28.2*  PLT 309 296   BMET Recent Labs    05/10/17 0303 05/11/17 0352  NA 135 136  K 3.8 3.8  CL 103 106  CO2 24 25  GLUCOSE 80 102*  BUN <5* <5*  CREATININE 1.06 1.02  CALCIUM 7.7* 7.7*   PT/INR No results for input(s): LABPROT, INR in the last 72 hours. CMP     Component Value Date/Time   NA 136 05/11/2017 0352   K 3.8 05/11/2017 0352   CL 106 05/11/2017 0352   CO2 25 05/11/2017 0352   GLUCOSE 102 (H) 05/11/2017 0352   BUN <5 (L) 05/11/2017 0352   CREATININE 1.02 05/11/2017 0352   CALCIUM 7.7 (L) 05/11/2017 0352   PROT 6.7 05/07/2017 0332   ALBUMIN 2.2 (L) 05/07/2017 0332   AST 14 (L) 05/07/2017 0332   ALT 7 (L) 05/07/2017 0332   ALKPHOS 44 05/07/2017 0332   BILITOT 1.2 05/07/2017 0332   GFRNONAA >60 05/11/2017 0352   GFRAA >60 05/11/2017 0352   Lipase     Component Value Date/Time   LIPASE 22 05/06/2017  1036       Studies/Results: No results found.  Anti-infectives: Anti-infectives (From admission, onward)   Start     Dose/Rate Route Frequency Ordered Stop   05/11/17 0600  cefoTEtan in Dextrose 5% (CEFOTAN) IVPB 2 g     2 g Intravenous On call to O.R. 05/10/17 1142 05/12/17 0559       Assessment/Plan Coronary artery disease. History of myocardial infarction Hypertension Status post thoracic aortic dissection and stent placement  status post bilateral iliac stent placements COPD CKD II NIDDM Chronic anemia- hgb 8.0,s/p 1 unit PRBC 4/29, will give another unit PRBC perioperatively Obesity Hypokalemia -improved  Reducible umbilical hernia Rectosigmoid colon mass - CEAelevated at 24 - seen on CT and colonoscopy - bxpathadenocarcinoma - echo done - EF 50-55% -OR today for resection of rectosigmoid colon cancer  FEN: NPO VTE: SCDs ID: [email or web address] periop  Recommend medical oncology consult for adenocarcinoma of the colon   LOS: 5 days    Brigid Re , [facility] Surgery 05/11/2017, 8:36 AM Pager: [phone] Consults: [phone] Mon-Fri 7:00 am-4:30 pm Sat-Sun 7:00 am-11:30 am

## 2017-05-11 NOTE — Progress Notes (Signed)
Subjective:  Patient denies any chest pain or shortness of breath.  Denies any abdominal pain.  Scheduled for sigmoid colectomy and umbilical hernia repair today  Objective:  Vital Signs in the last 24 hours: Temp:  [97.8 F (36.6 C)-98.9 F (37.2 C)] 98.2 F (36.8 C) (05/01 1156) Pulse Rate:  [62-76] 62 (05/01 1156) Resp:  [18] 18 (05/01 1156) BP: (138-156)/(72-87) 152/79 (05/01 1156) SpO2:  [97 %-100 %] 99 % (05/01 1156)  Intake/Output from previous day: 04/30 0701 - 05/01 0700 In: 329.5 [P.O.:240; I.V.:89.5] Out: 1100 [Urine:1100] Intake/Output from this shift: Total I/O In: 315 [Blood:315] Out: -   Physical Exam: Neck: no adenopathy, no carotid bruit, no JVD and supple, symmetrical, trachea midline Lungs: clear to auscultation bilaterally Heart: regular rate and rhythm, S1, S2 normal and soft systolic murmur noted Abdomen: soft, non-tender; bowel sounds normal; no masses,  no organomegaly Extremities: extremities normal, atraumatic, no cyanosis or edema  Lab Results: Recent Labs    05/10/17 0303 05/11/17 0352  WBC 8.8 8.5  HGB 8.1* 8.0*  PLT 309 296   Recent Labs    05/10/17 0303 05/11/17 0352  NA 135 136  K 3.8 3.8  CL 103 106  CO2 24 25  GLUCOSE 80 102*  BUN <5* <5*  CREATININE 1.06 1.02   No results for input(s): TROPONINI in the last 72 hours.  Invalid input(s): CK, MB Hepatic Function Panel No results for input(s): PROT, ALBUMIN, AST, ALT, ALKPHOS, BILITOT, BILIDIR, IBILI in the last 72 hours. No results for input(s): CHOL in the last 72 hours. No results for input(s): PROTIME in the last 72 hours.  Imaging: Imaging results have been reviewed and No results found.  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 Schedule for surgery later this afternoon   LOS: 5 days    Charolette Forward 05/11/2017, 12:00 PM

## 2017-05-12 ENCOUNTER — Encounter (HOSPITAL_COMMUNITY): Payer: Self-pay | Admitting: Surgery

## 2017-05-12 DIAGNOSIS — J449 Chronic obstructive pulmonary disease, unspecified: Secondary | ICD-10-CM

## 2017-05-12 DIAGNOSIS — N182 Chronic kidney disease, stage 2 (mild): Secondary | ICD-10-CM

## 2017-05-12 DIAGNOSIS — I129 Hypertensive chronic kidney disease with stage 1 through stage 4 chronic kidney disease, or unspecified chronic kidney disease: Secondary | ICD-10-CM

## 2017-05-12 DIAGNOSIS — I2510 Atherosclerotic heart disease of native coronary artery without angina pectoris: Secondary | ICD-10-CM

## 2017-05-12 DIAGNOSIS — E1122 Type 2 diabetes mellitus with diabetic chronic kidney disease: Secondary | ICD-10-CM

## 2017-05-12 DIAGNOSIS — E669 Obesity, unspecified: Secondary | ICD-10-CM

## 2017-05-12 DIAGNOSIS — Z6827 Body mass index (BMI) 27.0-27.9, adult: Secondary | ICD-10-CM

## 2017-05-12 DIAGNOSIS — Z87891 Personal history of nicotine dependence: Secondary | ICD-10-CM

## 2017-05-12 LAB — BPAM RBC
Blood Product Expiration Date: 201905222359
Blood Product Expiration Date: 201905302359
ISSUE DATE / TIME: 201905011135
ISSUE DATE / TIME: 201905011443
Unit Type and Rh: 7300
Unit Type and Rh: 7300

## 2017-05-12 LAB — TYPE AND SCREEN
ABO/RH(D): B POS
ANTIBODY SCREEN: NEGATIVE
UNIT DIVISION: 0
Unit division: 0

## 2017-05-12 LAB — BASIC METABOLIC PANEL
ANION GAP: 9 (ref 5–15)
BUN: <5 mg/dL — ABNORMAL LOW (ref 6–20)
CALCIUM: 7.7 mg/dL — AB (ref 8.9–10.3)
CO2: 21 mmol/L — ABNORMAL LOW (ref 22–32)
Chloride: 105 mmol/L (ref 101–111)
Creatinine, Ser: 1.05 mg/dL (ref 0.61–1.24)
GFR calc non Af Amer: >60 mL/min (ref >60)
Glucose, Bld: 180 mg/dL — ABNORMAL HIGH (ref 65–99)
Potassium: 4.4 mmol/L (ref 3.5–5.1)
Sodium: 135 mmol/L (ref 135–145)

## 2017-05-12 LAB — CBC
HCT: 37.2 % — ABNORMAL LOW (ref 39.0–52.0)
Hemoglobin: 11.1 g/dL — ABNORMAL LOW (ref 13.0–17.0)
MCH: 23.5 pg — ABNORMAL LOW (ref 26.0–34.0)
MCHC: 29.8 g/dL — ABNORMAL LOW (ref 30.0–36.0)
MCV: 78.8 fL (ref 78.0–100.0)
PLATELETS: 287 10*3/uL (ref 150–400)
RBC: 4.72 MIL/uL (ref 4.22–5.81)
RDW: 23.5 % — AB (ref 11.5–15.5)
WBC: 14.3 10*3/uL — AB (ref 4.0–10.5)

## 2017-05-12 LAB — GLUCOSE, CAPILLARY
Glucose-Capillary: 149 mg/dL — ABNORMAL HIGH (ref 65–99)
Glucose-Capillary: 135 mg/dL — ABNORMAL HIGH (ref 65–99)
Glucose-Capillary: 124 mg/dL — ABNORMAL HIGH (ref 65–99)
Glucose-Capillary: 115 mg/dL — ABNORMAL HIGH (ref 65–99)

## 2017-05-12 MED ORDER — METHOCARBAMOL 500 MG PO TABS
500.0000 mg | ORAL_TABLET | Freq: Four times a day (QID) | ORAL | Status: DC
  Administered 2017-05-12 – 2017-05-14 (×11): 500 mg via ORAL
  Filled 2017-05-12 (×12): qty 1

## 2017-05-12 MED ORDER — POTASSIUM CHLORIDE CRYS ER 20 MEQ PO TBCR: 20.0000 meq | EXTENDED_RELEASE_TABLET | Freq: Two times a day (BID) | ORAL | Status: DC

## 2017-05-12 MED ORDER — OXYCODONE HCL 5 MG PO TABS
5.0000 mg | ORAL_TABLET | ORAL | Status: DC | PRN
Start: 2017-05-12 — End: 2017-05-15
  Administered 2017-05-12 – 2017-05-13 (×3): 10 mg via ORAL
  Filled 2017-05-12 (×3): qty 2

## 2017-05-12 MED ORDER — LOSARTAN POTASSIUM 50 MG PO TABS
50.0000 mg | ORAL_TABLET | Freq: Every day | ORAL | Status: DC
  Administered 2017-05-12 – 2017-05-13 (×2): 50 mg via ORAL
  Filled 2017-05-12 (×2): qty 1

## 2017-05-12 NOTE — Consult Note (Signed)
[facility]    HEMATOLOGY/ONCOLOGY CONSULTATION NOTE  Date of Service: 05/12/2017  Patient Care Team: Charolette Forward, MD as PCP - General (Internal Medicine)  CHIEF COMPLAINTS/PURPOSE OF CONSULTATION:   Newly diagnosed colon cancer  HISTORY OF PRESENTING ILLNESS:   Jesse Le is a wonderful 72 y.o. male who has been referred to [place] by Dr .Charolette Forward, MD  for evaluation and management of newly diagnosed colon cancer.  Patient has a history of coronary artery disease, hypertension, diabetes mellitus, thoracic aortic aneurysm status post stent graft repair, COPD, chronic kidney disease stage II, obesity, chronic anemia who presented to the hospital with generalized abdominal pain and anorexia with occasional rectal bleeding for the last several months.  He presented with significant anemia with a hemoglobin of 5.6 had a CT angiogram of the chest abdomen pelvis which showed a 5 x 9 cm soft tissue mass in the sigmoid colon concerning for malignancy.  He also had PRBC transfusions.  He also received IV Feraheme. He has never had a colonoscopy in the past.  Colonoscopy was done by Dr. Hilarie Fredrickson on 05/08/2017 and showed Malignant partially obstructing tumor in the recto-sigmoid colon and in the distal sigmoid colon. Colonoscopy biopsy showed focus of invasive colonic adenocarcinoma on background of high-grade dysplasia. CEA level was noted to be 24.6.  No overt metastatic disease was seen on the CT chest abdomen pelvis. Patient was seen by surgery team and on 05/11/2017 had a  Colon Resection Sigmoid Low Anterior Resection with Staple Colon Relast  HERNIA REPAIR UMBILICAL ADULT  Creation Vertical Loop ILEOSTOMY  With Dr Verita Lamb MD.  We are consulted to and from a medical oncology standpoint. Patient's hemoglobin levels have improved to 11.1 with transfusions and IV iron. He notes that he is doing okay after the surgery and has some expected abdominal discomfort.  Has been passing gas and has  been started on a liquid diet.  We discussed the initial pathology results and findings of his imaging studies.  Pathology results from his surgical resection specimen are not currently available. We discussed about the fact that his final cancer staging will determine options for adjuvant chemotherapy. There is no overt metastatic disease but is pretty surgery CEA levels were significantly elevated to nearly 25 and we shall recheck postsurgically levels as outpatient to establish a baseline.    MEDICAL HISTORY:  Past Medical History:  Diagnosis Date  . Aneurysm of aorta (HCC)   . Coronary artery disease   . Diabetes mellitus without complication ([place])    NEW ONSET 09/2012  . Hypertension   . Myocardial infarct [facility])     SURGICAL HISTORY: Past Surgical History:  Procedure Laterality Date  . ABDOMINAL AORTIC ANEURYSM REPAIR  10-14-11   TEVAR  . COLON RESECTION SIGMOID N/A 05/11/2017   Procedure: Colon Resection Sigmoid Low Anterior Resection with Staple Colon Relast;  Surgeon: Donnie Mesa, MD;  Location: [place];  Service: General;  Laterality: N/A;  . COLONOSCOPY WITH PROPOFOL N/A 05/08/2017   Procedure: COLONOSCOPY WITH PROPOFOL;  Surgeon: Jerene Bears, MD;  Location: [place];  Service: Gastroenterology;  Laterality: N/A;  . ILEOSTOMY N/A 05/11/2017   Procedure: Creation Vertical Loop ILEOSTOMY;  Surgeon: Donnie Mesa, MD;  Location: [place];  Service: General;  Laterality: N/A;  . INTRAVASCULAR ULTRASOUND  10/14/2011   Procedure: INTRAVASCULAR ULTRASOUND;  Surgeon: Serafina Mitchell, MD;  Location: [place];  Service: Vascular;  Laterality: N/A;  . UMBILICAL HERNIA REPAIR N/A 05/11/2017   Procedure: HERNIA REPAIR  UMBILICAL ADULT;  Surgeon: Donnie Mesa, MD;  Location: [place];  Service: General;  Laterality: N/A;  . VIDEO BRONCHOSCOPY  10/14/2011   Procedure: VIDEO BRONCHOSCOPY;  Surgeon: Serafina Mitchell, MD;  Location: [facility] OR;  Service: Vascular;  Laterality: N/A;    SOCIAL  HISTORY: Social History   Socioeconomic History  . Marital status: Married    Spouse name: Not on file  . Number of children: Not on file  . Years of education: Not on file  . Highest education level: Not on file  Occupational History  . Not on file  Social Needs  . Financial resource strain: Not on file  . Food insecurity:    Worry: Not on file    Inability: Not on file  . Transportation needs:    Medical: Not on file    Non-medical: Not on file  Tobacco Use  . Smoking status: Former Smoker    Packs/day: 0.50    Types: Cigarettes    Last attempt to quit: 07/20/2011    Years since quitting: 5.8  . Smokeless tobacco: Never Used  Substance and Sexual Activity  . Alcohol use: Yes    Alcohol/week: 0.6 oz    Types: 1 Shots of liquor per week    Comment: weekends  . Drug use: No  . Sexual activity: Not on file  Lifestyle  . Physical activity:    Days per week: Not on file    Minutes per session: Not on file  . Stress: Not on file  Relationships  . Social connections:    Talks on phone: Not on file    Gets together: Not on file    Attends religious service: Not on file    Active member of club or organization: Not on file    Attends meetings of clubs or organizations: Not on file    Relationship status: Not on file  . Intimate partner violence:    Fear of current or ex partner: Not on file    Emotionally abused: Not on file    Physically abused: Not on file    Forced sexual activity: Not on file  Other Topics Concern  . Not on file  Social History Narrative  . Not on file    FAMILY HISTORY: History reviewed. No pertinent family history.  ALLERGIES:  has No Known Allergies.  MEDICATIONS:  Current Facility-Administered Medications  Medication Dose Route Frequency Provider Last Rate Last Dose  . 0.9 %  sodium chloride infusion   Intravenous Continuous Donnie Mesa, MD 75 mL/hr at 05/12/17 0830    . acetaminophen (TYLENOL) tablet 650 mg  650 mg Oral Q6H PRN  Donnie Mesa, MD      . amLODipine (NORVASC) tablet 5 mg  5 mg Oral Daily Donnie Mesa, MD   5 mg at 05/12/17 0820  . diphenhydrAMINE (BENADRYL) 12.5 MG/5ML elixir 12.5 mg  12.5 mg Oral Q6H PRN Donnie Mesa, MD       Or  . diphenhydrAMINE (BENADRYL) injection 12.5 mg  12.5 mg Intravenous Q6H PRN Donnie Mesa, MD      . enoxaparin (LOVENOX) injection 40 mg  40 mg Subcutaneous Q24H Donnie Mesa, MD   40 mg at 05/12/17 9983  . feeding supplement (BOOST / RESOURCE BREEZE) liquid 1 Container  1 Container Oral TID BM Donnie Mesa, MD   1 Container at 05/12/17 1537  . ferumoxytol (FERAHEME) 510 mg in sodium chloride 0.9 % 100 mL IVPB  510 mg Intravenous Weekly Donnie Mesa, MD  Stopped at 05/07/17 1423  . HYDROmorphone (DILAUDID) injection 1 mg  1 mg Intravenous Q2H PRN Donnie Mesa, MD   1 mg at 05/12/17 0819  . insulin aspart (novoLOG) injection 0-9 Units  0-9 Units Subcutaneous TID WC Donnie Mesa, MD   1 Units at 05/12/17 1536  . losartan (COZAAR) tablet 50 mg  50 mg Oral Daily Charolette Forward, MD   50 mg at 05/12/17 8786  . methocarbamol (ROBAXIN) tablet 500 mg  500 mg Oral QID Rayburn, Kelly A, PA-C   500 mg at 05/12/17 1536  . metoprolol tartrate (LOPRESSOR) tablet 25 mg  25 mg Oral BID Donnie Mesa, MD   25 mg at 05/12/17 0820  . ondansetron (ZOFRAN-ODT) disintegrating tablet 4 mg  4 mg Oral Q6H PRN Donnie Mesa, MD       Or  . ondansetron [facility]) injection 4 mg  4 mg Intravenous Q6H PRN Donnie Mesa, MD      . oxyCODONE (Oxy IR/ROXICODONE) immediate release tablet 5-10 mg  5-10 mg Oral Q4H PRN Rayburn, Kelly A, PA-C   10 mg at 05/12/17 0820  . pantoprazole (PROTONIX) EC tablet 40 mg  40 mg Oral Q0600 Donnie Mesa, MD   40 mg at 05/12/17 0510    REVIEW OF SYSTEMS:    10 Point review of Systems was done is negative except as noted above.  PHYSICAL EXAMINATION: ECOG PERFORMANCE STATUS: 2 - Symptomatic, <50% confined to bed  . Vitals:   05/12/17 0800 05/12/17  1606  BP: 137/74 (!) 158/76  Pulse: 75 90  Resp: 18 16  Temp: 97.8 F (36.6 C) 98.9 F (37.2 C)  SpO2: 100% 97%   Filed Weights   05/06/17 1849 05/11/17 1235  Weight: 208 lb 15.9 oz (94.8 kg) 208 lb 5.4 oz (94.5 kg)   .Body mass index is 27.49 kg/m.  GENERAL:alert, in no acute distress and comfortable SKIN: no acute rashes, no significant lesions EYES: conjunctiva are pink and non-injected, sclera anicteric OROPHARYNX: MMM, no exudates, no oropharyngeal erythema or ulceration NECK: supple, no JVD LYMPH:  no palpable lymphadenopathy in the cervical, axillary or inguinal regions LUNGS: clear to auscultation b/l with normal respiratory effort HEART: regular rate & rhythm ABDOMEN:  normoactive bowel sounds , non tender, not distended. Extremity: no pedal edema PSYCH: alert & oriented x 3 with fluent speech NEURO: no focal motor/sensory deficits  LABORATORY DATA:  I have reviewed the data as listed  . CBC Latest Ref Rng & Units 05/13/2017 05/12/2017 05/11/2017  WBC 4.0 - 10.5 K/uL 12.1(H) 14.3(H) 8.5  Hemoglobin 13.0 - 17.0 g/dL 9.6(L) 11.1(L) 8.0(L)  Hematocrit 39.0 - 52.0 % 32.1(L) 37.2(L) 28.2(L)  Platelets 150 - 400 K/uL 217 287 296   . CBC    Component Value Date/Time   WBC 12.1 (H) 05/13/2017 0358   RBC 4.05 (L) 05/13/2017 0358   HGB 9.6 (L) 05/13/2017 0358   HCT 32.1 (L) 05/13/2017 0358   PLT 217 05/13/2017 0358   MCV 79.3 05/13/2017 0358   MCH 23.7 (L) 05/13/2017 0358   MCHC 29.9 (L) 05/13/2017 0358   RDW 24.4 (H) 05/13/2017 0358   LYMPHSABS 1.5 10/09/2011 1013   MONOABS 0.5 10/09/2011 1013   EOSABS 0.1 10/09/2011 1013   BASOSABS 0.0 10/09/2011 1013    CMP Latest Ref Rng & Units 05/13/2017 05/12/2017 05/11/2017  Glucose 65 - 99 mg/dL 118(H) 180(H) 102(H)  BUN 6 - 20 mg/dL <5(L) <5(L) <5(L)  Creatinine 0.61 - 1.24 mg/dL 1.20 1.05 1.02  Sodium 135 - 145 mmol/L 134(L) 135 136  Potassium 3.5 - 5.1 mmol/L 4.1 4.4 3.8  Chloride 101 - 111 mmol/L 106 105 106  CO2 22 -  32 mmol/L 23 21(L) 25  Calcium 8.9 - 10.3 mg/dL 7.4(L) 7.7(L) 7.7(L)  Total Protein 6.5 - 8.1 g/dL - - -  Total Bilirubin 0.3 - 1.2 mg/dL - - -  Alkaline Phos 38 - 126 U/L - - -  AST 15 - 41 U/L - - -  ALT 17 - 63 U/L - - -   Component     Latest Ref Rng & Units 05/06/2017 05/08/2017  Iron     45 - 182 ug/dL 20 (L)   TIBC     250 - 450 ug/dL 325   Saturation Ratios     17.9 - 39.5 % 6 (L)   UIBC     ug/dL 305   Vitamin B12     180 - 914 pg/mL 97 (L)   Folate     >5.9 ng/mL 7.1   Ferritin     24 - 336 ng/mL 7 (L)   CEA     0.0 - 4.7 ng/mL  24.6 (H)     Colonoscopy 05/08/2017 - Malignant partially obstructing tumor in the recto-sigmoid colon and in the distal sigmoid colon. Biopsied. Tattooed. - Diverticulosis in the sigmoid colon and in the descending colon. - Stool in the entire examined colon. - The distal rectum and anal verge are normal on retroflexion view.     RADIOGRAPHIC STUDIES: I have personally reviewed the radiological images as listed and agreed with the findings in the report. Ct Angio Chest/abd/pel For Dissection W And/or Wo Contrast  Result Date: 05/06/2017 CLINICAL DATA:  History of thoracic aortic aneurysm and dissection. Gastrointestinal bleeding. EXAM: CT ANGIOGRAPHY CHEST, ABDOMEN AND PELVIS TECHNIQUE: Multidetector CT imaging through the chest, abdomen and pelvis was performed using the standard protocol during bolus administration of intravenous contrast. Multiplanar reconstructed images and MIPs were obtained and reviewed to evaluate the vascular anatomy. CONTRAST:  126mL ISOVUE-370 IOPAMIDOL (ISOVUE-370) INJECTION 76% COMPARISON:  CT scans of November 22, 2011 and October 12, 2011. FINDINGS: CTA CHEST FINDINGS Cardiovascular: 4.1 cm ascending thoracic aortic aneurysm is noted. Great vessels are widely patent without significant stenosis. Status post stent graft placement beginning in transverse aortic arch distal to the origin of left subclavian artery,  that extends through 7.5 cm distal descending thoracic aortic aneurysm into abdominal aorta. Excluded aneurysmal sac measures 9 cm. Normal cardiac size. No pericardial effusion is noted. Mediastinum/Nodes: No enlarged mediastinal, hilar, or axillary lymph nodes. Thyroid gland, trachea, and esophagus demonstrate no significant findings. Lungs/Pleura: No pneumothorax or pleural effusion is noted. Mild emphysematous disease is noted in the upper lobes bilaterally. Minimal left lower lobe subsegmental atelectasis is noted. 4 mm nodule is noted in left lower lobe best seen on image number 80 of series 7. Musculoskeletal: No chest wall abnormality. No acute or significant osseous findings. Review of the MIP images confirms the above findings. CTA ABDOMEN AND PELVIS FINDINGS VASCULAR Aorta: Status post stent graft repair of abdominal aortic aneurysm. No endoleak is noted. Graft and limbs are widely patent. Excluded aneurysmal sac of infrarenal abdominal aorta has maximum measured transverse diameter of 4.5 cm. Celiac: Stent is noted in proximal portion which is widely patent. SMA: Stent is noted in proximal portion which is widely patent. Renals: Stent is noted in proximal portion of right renal artery which appears to be patent. 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 IMA: Occluded proximally, with reconstitution of its more distal portion through collaterals. Inflow: Stent graft extends into right common iliac artery which is widely patent. Stent graft extends into left external iliac artery, with separate stent extending into left internal iliac artery. These are patent. 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 Small focal dissection is noted in proximal portion of left external iliac artery. Veins: No obvious venous abnormality within the limitations of this arterial phase study. Review of the MIP images confirms the above findings. NON-VASCULAR  Hepatobiliary: Cholelithiasis is noted without inflammation. No definite liver abnormality is noted. No biliary dilatation is noted. Pancreas: Unremarkable. No pancreatic ductal dilatation or surrounding inflammatory changes. Spleen: Normal in size without focal abnormality. Adrenals/Urinary Tract: Adrenal glands are unremarkable. Stable exophytic cyst is seen arising from midpole of right kidney. 2.3 cm low density is noted in lower pole of right kidney which is increased in size compared to prior exam. It demonstrates average Hounsfield measurement of 23. Severe left renal atrophy is noted with associated left renal cyst. No hydronephrosis or renal obstruction is noted. Urinary bladder is unremarkable. Stomach/Bowel: The stomach appears normal. The appendix appears normal. Diverticulosis of descending colon is noted. Large eccentric soft tissue abnormality is seen involving the sigmoid colon which measures 9 x 5 cm. This is concerning for neoplasm or malignancy, although diverticulitis cannot be excluded. Lymphatic: No significant adenopathy is noted. Reproductive: Prostate is unremarkable. Other: Moderate size fat containing periumbilical hernia is noted. Musculoskeletal: No acute or significant osseous findings. Review of the MIP images confirms the above findings. IMPRESSION: 9 x 5 cm eccentric soft tissue abnormality seen involving the sigmoid colon concerning for neoplasm or malignancy, although diverticulitis cannot be excluded. Sigmoidoscopy is recommended for further evaluation. 2.3 cm low density is noted in lower pole of right kidney which is increased in size compared to prior exam. Further evaluation with ultrasound is recommended evaluate for cyst versus neoplasm. 4.1 cm ascending thoracic aortic aneurysm. Status post stent graft placement involving thoracic aortic arch and extending through descending thoracic aorta into abdominal aorta. The graft is widely patent without evidence of endoleak. Patent  stents are seen in both iliac arteries. 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 Small focal dissection is noted in proximal left external iliac artery. 7.5 cm excluded aneurysmal sac is noted in distal descending thoracic aorta without evidence of endoleak. 4 mm nodule seen in left lower lobe. No follow-up needed if patient is low-risk. Non-contrast chest CT can be considered in 12 months if patient is high-risk. This recommendation follows the consensus statement: Guidelines for Management of Incidental Pulmonary Nodules Detected on CT Images: From the Fleischner Society 2017; Radiology 2017; 284:228-243. Excluded aneurysmal sac infrarenal abdominal aorta has maximum measured diameter 4.5 cm. Patent stents are noted in the celiac, superior mesenteric and right renal arteries. 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 Moderate size fat containing periumbilical hernia. Aortic Atherosclerosis (ICD10-I70.0) and Emphysema (ICD10-J43.9). Electronically Signed   By: Marijo Conception, M.D.   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 No overt metastatic disease on CTA chest abdomen pelvis. Presurgery CEA levels of 24.5. Patient is status post surgical resection of his partially obstructing  rectosigmoid adenocarcinoma on 05/11/2017. Awaiting surgical staging pathology results.  Plan -I discussed the available lab results, and CEA levels and imaging studies with the patient. - he is currently receiving postoperative cares.  Noted that his passing gas and has been started on liquid diet.  Pain is been well controlled. -We discussed that we will see him in clinic to go over the surgical pathology results for local staging.  At the time we will also repeat  a CEA level to get a postsurgical baseline. -Local staging will determine need for adjuvant chemotherapy. -I will help set up his follow-up with me in the clinic in the next 2 to 3 weeks. -Depending on choice of adjuvant chemotherapy he might need a port placement later. -We discussed that it would be important for him to heal from his surgery and optimize his nutritional status as much as possible.  #2 iron deficiency anemia related to chronic blood loss from his newly diagnosed colon cancer. Anemia is improved after blood transfusions and IV iron. Plan -We will recheck iron levels and labs in clinic in a couple of weeks.  #3 . Patient Active Problem List   Diagnosis Date Noted  . Acute blood loss anemia   . Cancer of sigmoid ([place])   . Iron deficiency anemia due to chronic blood loss   . Abnormal CT scan, sigmoid colon   . Generalized abdominal pain 05/06/2017  . Thoracoabdominal aneurysm ([place]) 11/22/2011  . Aftercare following surgery of the circulatory system, [place] 11/22/2011  . Myocardial infarct [facility])    Plan -will need continued f/u with PCP on discharge for mx of other medical co-morbidities.  Thanks for this interesting consultation. Will f/u in clinic in 2 weeks with labs  All of the patients questions were answered with apparent satisfaction. The patient knows to call the clinic with any problems, questions or concerns.  I spent 55 minutes counseling the patient face to face. The total time spent in the appointment was 80 minutes and more than 50% was on counseling and direct patient cares.    Sullivan Lone MD [place] AAHIVMS [facility] Hematology/Oncology Physician [facility]  (Office):       [phone] (Work cell):  [phone] (Fax):           [phone]  05/12/2017 5:09 PM

## 2017-05-12 NOTE — Consult Note (Signed)
[place] Nurse ostomy follow up RUQ loop ileostomy.  I have ordered the following supplies for placement at the bedside:  Barrier rings Kellie Simmering # [phone]), 2 3/4 inches barriers Kellie Simmering # [phone]), and 2 3/4 pouches Kellie Simmering # [phone]).  I left with the patient a folder with brochures and information from [employer], particularly about ileostomy facts.  The patient states reading is a "challenge" for him, but his daughter and granddaughter can read and he will ask them to read the materials I have provided.  The bulk of what he is taught will need to be given verbally and by demonstration/return demonstration.  Hopefully his family members can participate also.  I plan to return to teach and do a pouch change when the supplies arrive. Val Riles, RN, MSN, CWOCN, CNS-BC, pager [phone]

## 2017-05-12 NOTE — Progress Notes (Signed)
Patient was medicated with Dilaudid x 2 this shift for severe pain and oxycodone x 1.  Jp drain had a total of 100 mls of bloody drainage out.  JP drain is charged and intact.  Ileostomy had 60 mls of straw colored drainage out this shift. Foley catheter remains patent and intact, reason for foley is s/p surgical procedure.  Patient is on 3 lpm oxygen via nasal canula. No signs of respiratory distress.  IV fluids continue to infuse at 75 mls/hr via left hand peripheral IV catheter.  Call bell remains within reach.

## 2017-05-12 NOTE — Consult Note (Signed)
Explained role of ostomy nurse and creation of stoma.  Patient told me he had some kind of "knot" and that was the reason he had his surgery. Explained what the stoma is. Demonstrated pouch change (cutting new skin barrier, measuring stoma, cleaning peristomal skin and stoma, use of barrier ring). Education on emptying when 1/3 to 1/2 full and how to empty. Marland KitchenAnswered patient questions; no family present.  Patient attempted to call his daughter, but was unsuccessful.  I explained I will check with him in the morning to find out if anyone in his family will be part of our education for his care. The patient was not able to close the sample pouch or attach the barrier to the sample pouch.  He states he has difficulty seeing and doubts he will be able to cut the barrier or place it on his abdomen.  The [place] team will continue to follow and provide education and support. Hi stoma measured 1 1/4 inches, is budded, edematous, and has all sutures intact.  There is a partial portion of a red robin catheter that is sutured to his skin functioning as a support rod.  A barrier ring was placed over the support rod.  A good seal was obtained.  The surgical dressing was not disturbed.  Val Riles, RN, MSN, CWOCN, CNS-BC, pager [phone]

## 2017-05-12 NOTE — Progress Notes (Signed)
Patient refused to get out of bed today. Several attempts made, encouragement given, education provided about the importance of moving after surgery, to no avail.

## 2017-05-12 NOTE — Progress Notes (Signed)
Subjective:  Appreciate all consultants help. 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 Patient denies any chest pain or shortness of breath. Denies abdominal pain  Objective:  Vital Signs in the last 24 hours: Temp:  [97.7 F (36.5 C)-98.2 F (36.8 C)] 97.8 F (36.6 C) (05/02 0800) Pulse Rate:  [56-84] 75 (05/02 0800) Resp:  [13-18] 18 (05/02 0800) BP: (137-178)/(74-97) 137/74 (05/02 0800) SpO2:  [94 %-100 %] 100 % (05/02 0800) Weight:  [94.5 kg (208 lb 5.4 oz)] 94.5 kg (208 lb 5.4 oz) (05/01 1235)  Intake/Output from previous day: 05/01 0701 - 05/02 0700 In: 4865 [P.O.:120; I.V.:3700; Blood:945; IV Piggyback:100] Out: 35 [Urine:900; Drains:130; Blood:500] Intake/Output from this shift: Total I/O In: -  Out: 25 [Drains:25]  Physical Exam: Neck: no adenopathy, no carotid bruit, no JVD and supple, symmetrical, trachea midline Lungs: Clear to auscultation anterolaterally Heart: regular rate and rhythm, S1, S2 normal and Soft systolic murmur noted Abdomen: soft, non-tender; bowel sounds normal; no masses,  no organomegaly Extremities: extremities normal, atraumatic, no cyanosis or edema  Lab Results: Recent Labs    05/11/17 0352 05/12/17 0220  WBC 8.5 14.3*  HGB 8.0* 11.1*  PLT 296 287   Recent Labs    05/11/17 0352 05/12/17 0220  NA 136 135  K 3.8 4.4  CL 106 105  CO2 25 21*  GLUCOSE 102* 180*  BUN <5* <5*  CREATININE 1.02 1.05   No results for input(s): TROPONINI in the last 72 hours.  Invalid input(s): CK, MB Hepatic Function Panel No results for input(s): PROT, ALBUMIN, AST, ALT, ALKPHOS, BILITOT, BILIDIR, IBILI in the last 72 hours. No results for input(s): CHOL in the last 72 hours. No results for input(s): PROTIME in the last 72 hours.  Imaging: Imaging results have been reviewed and No results  found.  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

## 2017-05-12 NOTE — Progress Notes (Signed)
Medical oncology consult received. Patient was in [place] yesterday. Will see today.  Sullivan Lone MD

## 2017-05-12 NOTE — Progress Notes (Signed)
Nutrition Follow-up  DOCUMENTATION CODES:   Not applicable  INTERVENTION:   Continue Boost Breeze po TID, each supplement provides 250 kcal and 9 grams of protein Adjust supplements as needed once diet advances  NUTRITION DIAGNOSIS:   Increased nutrient needs related to (cancer) as evidenced by estimated needs. Ongoing.   GOAL:   Patient will meet greater than or equal to 90% of their needs Progressing.   MONITOR:   PO intake, Supplement acceptance, Diet advancement, Weight trends, Labs, Skin, I & O's  ASSESSMENT:   72 year old male here with iron deficiency anemia with recent rectal bleeding found to have abnormal CT scan of the abdomen and pelvis suggestive of a sigmoid cancer.  He has a history of peripheral vascular disease, aortic aneurysm with endovascular repair, CAD, hypertension, hyperlipidemia, COPD, CKD.  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 Per pt he tolerated clear liquids at lunch but did not drink much. He likes the boost breeze and is willing to drink them.   Medications reviewed and include: SSI, boost breeze TID Labs reviewed CBG (last 3)  Recent Labs    05/12/17 0755 05/12/17 1132 05/12/17 1523  GLUCAP 149* 135* 124*     Diet Order:   Diet Order           Diet clear liquid Room service appropriate? Yes; Fluid consistency: Thin  Diet effective now          EDUCATION NEEDS:   Not appropriate for education at this time  Skin:  Skin Assessment: Reviewed RN Assessment  Last BM:  small amt via ileostomy today  Height:   Ht Readings from Last 1 Encounters:  05/11/17 6' 0.99" (1.854 m)    Weight:   Wt Readings from Last 1 Encounters:  05/11/17 208 lb 5.4 oz (94.5 kg)    Ideal Body Weight:  83.6 kg  BMI:  Body mass index is 27.49 kg/m.  Estimated Nutritional Needs:   Kcal:  2100-2400  Protein:   105-120 grams  Fluid:  2.1 - 2.4 L/day  Maylon Peppers RD, LDN, CNSC [phone] Pager [phone] After Hours Pager

## 2017-05-12 NOTE — Anesthesia Postprocedure Evaluation (Signed)
Anesthesia Post Note  Patient: Jesse Le  Procedure(s) Performed: Colon Resection Sigmoid Low Anterior Resection with Staple Colon Relast (N/A Abdomen) HERNIA REPAIR UMBILICAL ADULT (N/A Abdomen) Creation Vertical Loop ILEOSTOMY (N/A Abdomen)     Patient location during evaluation: PACU Anesthesia Type: General Level of consciousness: awake and alert Pain management: pain level controlled Vital Signs Assessment: post-procedure vital signs reviewed and stable Respiratory status: spontaneous breathing, nonlabored ventilation, respiratory function stable and patient connected to nasal cannula oxygen Cardiovascular status: blood pressure returned to baseline and stable Postop Assessment: no apparent nausea or vomiting Anesthetic complications: no    Last Vitals:  Vitals:   05/11/17 1900 05/12/17 0800  BP: (!) 171/88 137/74  Pulse: 79 75  Resp:  18  Temp:  36.6 C  SpO2: 100% 100%    Last Pain:  Vitals:   05/12/17 0330  TempSrc:   PainSc: Tyler Deis

## 2017-05-12 NOTE — Progress Notes (Signed)
[facility] Progress Note  1 Day Post-Op  Subjective: CC: abdominal pain Patient states his abdomen feels very sore all over. Denies nausea. Has not had anything to eat/drink yet.   Objective: Vital signs in last 24 hours: Temp:  [97.7 F (36.5 C)-98.2 F (36.8 C)] 97.7 F (36.5 C) (05/01 1815) Pulse Rate:  [56-84] 79 (05/01 1900) Resp:  [13-18] 18 (05/01 1815) BP: (144-178)/(77-97) 171/88 (05/01 1900) SpO2:  [94 %-100 %] 100 % (05/01 1900) Weight:  [94.5 kg (208 lb 5.4 oz)] 94.5 kg (208 lb 5.4 oz) (05/01 1235) Last BM Date: 05/11/17  Intake/Output from previous day: 05/01 0701 - 05/02 0700 In: 4865 [P.O.:120; I.V.:3700; Blood:945; IV Piggyback:100] Out: 7591 [Urine:900; Drains:130; Blood:500] Intake/Output this shift: No intake/output data recorded.  PE: Gen:  Alert, NAD Card:  Regular rate and rhythm, pedal pulses 2+ BL Pulm:  Normal effort, clear to auscultation bilaterally Abd: Soft, appropriately tender, non-distended, bowel sounds hypoactive, no HSM, incision with honeycomb dressing and small amount of blood on dressing; stoma pink, small amount serosanguinous drainage in bag; drain with serosanguinous drainage Skin: warm and dry, no rashes  Psych: A&Ox3   Lab Results:  Recent Labs    05/11/17 0352 05/12/17 0220  WBC 8.5 14.3*  HGB 8.0* 11.1*  HCT 28.2* 37.2*  PLT 296 287   BMET Recent Labs    05/11/17 0352 05/12/17 0220  NA 136 135  K 3.8 4.4  CL 106 105  CO2 25 21*  GLUCOSE 102* 180*  BUN <5* <5*  CREATININE 1.02 1.05  CALCIUM 7.7* 7.7*   PT/INR No results for input(s): LABPROT, INR in the last 72 hours. CMP     Component Value Date/Time   NA 135 05/12/2017 0220   K 4.4 05/12/2017 0220   CL 105 05/12/2017 0220   CO2 21 (L) 05/12/2017 0220   GLUCOSE 180 (H) 05/12/2017 0220   BUN <5 (L) 05/12/2017 0220   CREATININE 1.05 05/12/2017 0220   CALCIUM 7.7 (L) 05/12/2017 0220   PROT 6.7 05/07/2017 0332   ALBUMIN 2.2 (L) 05/07/2017  0332   AST 14 (L) 05/07/2017 0332   ALT 7 (L) 05/07/2017 0332   ALKPHOS 44 05/07/2017 0332   BILITOT 1.2 05/07/2017 0332   GFRNONAA >60 05/12/2017 0220   GFRAA >60 05/12/2017 0220   Lipase     Component Value Date/Time   LIPASE 22 05/06/2017 1036       Studies/Results: No results found.  Anti-infectives: Anti-infectives (From admission, onward)   Start     Dose/Rate Route Frequency Ordered Stop   05/11/17 2200  cefoTEtan (CEFOTAN) 2 g in sodium chloride 0.9 % 100 mL IVPB     2 g 200 mL/hr over 30 Minutes Intravenous Every 12 hours 05/11/17 1839 05/11/17 2034   05/11/17 0600  cefoTEtan in Dextrose 5% (CEFOTAN) IVPB 2 g     2 g Intravenous On call to O.R. 05/10/17 1142 05/11/17 1416       Assessment/Plan Coronary artery disease. History of myocardial infarction Hypertension Status post thoracic aortic dissection and stent placement  status post bilateral iliac stent placements COPD CKD II NIDDM Chronic anemia- hgb11.1, continue to monitor Obesity Hypokalemia -improved  Reducible umbilical hernia Rectosigmoid colon mass- S/p LAR with primary colorectal anastomosis and diverting loop ileostomy with umbilical hernia repair 06/14/82 Dr. Georgette Dover - CEAelevated at 84 - POD#1 - path from colonoscopy is adenocarcinoma, surgical path pending - consulted medical oncology yesterday - drain with 130 cc SS -  continue - keep on CLD for now and ADAT with bowel function    FEN: CLD VTE: SCDs ID: [email or web address] periop    LOS: 6 days    Brigid Re , [facility] 05/12/2017, 7:52 AM Pager: [phone] Consults: [phone] Mon-Fri 7:00 am-4:30 pm Sat-Sun 7:00 am-11:30 am

## 2017-05-13 LAB — BASIC METABOLIC PANEL
Anion gap: 5 (ref 5–15)
CALCIUM: 7.4 mg/dL — AB (ref 8.9–10.3)
CO2: 23 mmol/L (ref 22–32)
CREATININE: 1.2 mg/dL (ref 0.61–1.24)
Chloride: 106 mmol/L (ref 101–111)
GFR calc Af Amer: >60 mL/min (ref >60)
GFR, EST NON AFRICAN AMERICAN: 59 mL/min — AB (ref >60)
Glucose, Bld: 118 mg/dL — ABNORMAL HIGH (ref 65–99)
Potassium: 4.1 mmol/L (ref 3.5–5.1)
SODIUM: 134 mmol/L — AB (ref 135–145)

## 2017-05-13 LAB — GLUCOSE, CAPILLARY
GLUCOSE-CAPILLARY: 123 mg/dL — AB (ref 65–99)
GLUCOSE-CAPILLARY: 107 mg/dL — AB (ref 65–99)
Glucose-Capillary: 102 mg/dL — ABNORMAL HIGH (ref 65–99)
Glucose-Capillary: 118 mg/dL — ABNORMAL HIGH (ref 65–99)

## 2017-05-13 LAB — CBC
HCT: 32.1 % — ABNORMAL LOW (ref 39.0–52.0)
HEMOGLOBIN: 9.6 g/dL — AB (ref 13.0–17.0)
MCH: 23.7 pg — ABNORMAL LOW (ref 26.0–34.0)
MCHC: 29.9 g/dL — AB (ref 30.0–36.0)
MCV: 79.3 fL (ref 78.0–100.0)
PLATELETS: 217 10*3/uL (ref 150–400)
RBC: 4.05 MIL/uL — ABNORMAL LOW (ref 4.22–5.81)
RDW: 24.4 % — AB (ref 11.5–15.5)
WBC: 12.1 10*3/uL — ABNORMAL HIGH (ref 4.0–10.5)

## 2017-05-13 NOTE — NC FL2 (Signed)
[place] LEVEL OF CARE SCREENING TOOL     IDENTIFICATION  Patient Name: Jesse Le Birthdate: Oct 29, 1945 Sex: male Admission Date (Current Location): 05/06/2017  [facility] and [place] Number:  Herbalist and Address:  [facility] [employer], [place] [address] [place], [place] [address]      Provider Number: [id number]  Attending Physician Name and Address:  Charolette Forward, MD  Relative Name and Phone Number:       Current Level of Care: Hospital Recommended Level of Care: [place] Prior Approval Number:    Date Approved/Denied:   PASRR Number: [id number]  Discharge Plan: SNF    Current Diagnoses: Patient Active Problem List   Diagnosis Date Noted  . Acute blood loss anemia   . Cancer of sigmoid ([place])   . Iron deficiency anemia due to chronic blood loss   . Abnormal CT scan, sigmoid colon   . Generalized abdominal pain 05/06/2017  . 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 Aftercare following surgery of the circulatory system, [place] 11/22/2011  . Myocardial infarct ([facility])     Orientation RESPIRATION BLADDER Height & Weight     Self, Time, Situation, Place  Normal Incontinent, External catheter Weight: 208 lb 5.4 oz (94.5 kg) Height:  6' 0.99" (185.4 cm)  BEHAVIORAL SYMPTOMS/MOOD NEUROLOGICAL BOWEL NUTRITION STATUS      Incontinent, Ileostomy(placed 05/11/17) Diet(see DC summary)  AMBULATORY STATUS COMMUNICATION OF NEEDS Skin   Extensive Assist Verbally Surgical wounds                       Personal Care Assistance Level of Assistance  Bathing, Dressing Bathing Assistance: Maximum assistance   Dressing Assistance: Maximum assistance     Functional Limitations Info             SPECIAL CARE FACTORS FREQUENCY  PT (By licensed PT), OT (By licensed OT)     PT Frequency: 5/wk OT Frequency: 5/wk            Contractures      Additional Factors Info  Code Status, Allergies, Insulin Sliding  Scale Code Status Info: FULL Allergies Info: NKA   Insulin Sliding Scale Info: 3/day       Current Medications (05/13/2017):  This is the current hospital active medication list Current Facility-Administered Medications  Medication Dose Route Frequency Provider Last Rate Last Dose  . 0.9 %  sodium chloride infusion   Intravenous Continuous Rayburn, Kelly A, PA-C 100 mL/hr at 05/13/17 0921    . acetaminophen (TYLENOL) tablet 650 mg  650 mg Oral Q6H PRN Donnie Mesa, MD      . amLODipine (NORVASC) tablet 5 mg  5 mg Oral Daily Donnie Mesa, MD   5 mg at 05/13/17 0919  . diphenhydrAMINE (BENADRYL) 12.5 MG/5ML elixir 12.5 mg  12.5 mg Oral Q6H PRN Donnie Mesa, MD       Or  . diphenhydrAMINE (BENADRYL) injection 12.5 mg  12.5 mg Intravenous Q6H PRN Donnie Mesa, MD      . enoxaparin (LOVENOX) injection 40 mg  40 mg Subcutaneous Q24H Donnie Mesa, MD   40 mg at 05/13/17 0818  . feeding supplement (BOOST / RESOURCE BREEZE) liquid 1 Container  1 Container Oral TID BM Donnie Mesa, MD   1 Container at 05/13/17 0920  . ferumoxytol (FERAHEME) 510 mg in sodium chloride 0.9 % 100 mL IVPB  510 mg Intravenous Weekly Donnie Mesa, MD   Stopped at 05/07/17 1423  .  HYDROmorphone (DILAUDID) injection 1 mg  1 mg Intravenous Q2H PRN Donnie Mesa, MD   1 mg at 05/12/17 0819  . insulin aspart (novoLOG) injection 0-9 Units  0-9 Units Subcutaneous TID WC Donnie Mesa, MD   1 Units at 05/13/17 0800  . losartan (COZAAR) tablet 50 mg  50 mg Oral Daily Charolette Forward, MD   50 mg at 05/13/17 0919  . methocarbamol (ROBAXIN) tablet 500 mg  500 mg Oral QID Rayburn, Kelly A, PA-C   500 mg at 05/13/17 0919  . metoprolol tartrate (LOPRESSOR) tablet 25 mg  25 mg Oral BID Donnie Mesa, MD   25 mg at 05/13/17 0919  . ondansetron (ZOFRAN-ODT) disintegrating tablet 4 mg  4 mg Oral Q6H PRN Donnie Mesa, MD       Or  . ondansetron [facility]) injection 4 mg  4 mg Intravenous Q6H PRN Donnie Mesa, MD      .  oxyCODONE (Oxy IR/ROXICODONE) immediate release tablet 5-10 mg  5-10 mg Oral Q4H PRN Rayburn, Kelly A, PA-C   10 mg at 05/13/17 0300  . pantoprazole (PROTONIX) EC tablet 40 mg  40 mg Oral Q0600 Donnie Mesa, MD   40 mg at 05/13/17 0620     Discharge Medications: Please see discharge summary for a list of discharge medications.  Relevant Imaging Results:  Relevant Lab Results:   Additional Information SS#: [id number]  Jorge Ny, LCSW

## 2017-05-13 NOTE — Consult Note (Signed)
[place] Nurse ostomy follow up To patient's room to conduct teaching session.  Patient is lethargic, unable to focus on the conversation.  He was able to tell me he could not get his daughter or granddaughter on the phone to arrange a teaching session.  He does not want his girlfriend, who is his emergency contact, to be contacted for any type of teaching session.  He states she has a "nervous problem" and will not be able to learn what to do or to help him.  The patient had two numbers written on a paper towel.  This number belongs to his daughter:  [phone].  I was unable to reach her on this number.  This number belongs to his granddaughter, Jesse Le, [phone].  I was able to reach her on the phone and she verified her identity. We spoke at length about the surgery Jesse Le has had, the ileostomy he now has and some of what this means for his self-care.  Ms. Burt Knack told me her grandfather had to be made to perform his ADLs (bathing, dressing, etc.) and that he rarely got out or walked much in the house.  We talked about skilled nursing facility placement for on-going care of his needs and the ileostomy.  She has agreed to come to the hospital Monday, 05/16/17, at 10:30 for one of our team to work with her and teach her about the ileostomy care needs.  She will then communicate this to her mother and they discuss SNF placement. Val Riles, RN, MSN, CWOCN, CNS-BC, pager [phone]

## 2017-05-13 NOTE — Progress Notes (Signed)
Patient educated on the importance of getting out of bed and moving.  Patient did not appear to be interested in this information.  Foley catheter removed.  JP drain has put out 20 mls so far this shift of bloody drainage.  Honeycomb dressing intact on patient's abdomen.  Ileostomy is still resting at this time with no output.  Patient medicated twice with 10 mg of oxycodone  Patient was weaned of off oxygen via nasal canula.  Will continue to monitor.

## 2017-05-13 NOTE — Progress Notes (Signed)
Subjective:  Patient denies any chest pain or shortness of breath.  Complains of vague abdominal pain.  Tolerating clear liquids.  Objective:  Vital Signs in the last 24 hours: Temp:  [98.1 F (36.7 C)-99 F (37.2 C)] 99 F (37.2 C) (05/03 0841) Pulse Rate:  [88-96] 96 (05/03 0841) Resp:  [16-20] 20 (05/03 0841) BP: (131-160)/(61-76) 158/69 (05/03 0841) SpO2:  [97 %-98 %] 97 % (05/03 0841)  Intake/Output from previous day: 05/02 0701 - 05/03 0700 In: 1953.8 [P.O.:240; I.V.:1713.8] Out: 890 [Urine:800; Drains:90] Intake/Output from this shift: Total I/O In: 82.5 [I.V.:42.5; Other:40] Out: -   Physical Exam: Neck: no adenopathy, no carotid bruit, no JVD and supple, symmetrical, trachea midline Lungs: clear to auscultation anterolaterally Heart: regular rate and rhythm, S1, S2 normal and soft systolic murmur noted Abdomen: soft, mildly distended.  Bowel sounds present.  Ileostomy stoma site okay.  Small drainage noted in Jackson-Pratt bag.  Minimal soaking off lower half of surgical dressing. Extremities: extremities normal, atraumatic, no cyanosis or edema  Lab Results: Recent Labs    05/12/17 0220 05/13/17 0358  WBC 14.3* 12.1*  HGB 11.1* 9.6*  PLT 287 217   Recent Labs    05/12/17 0220 05/13/17 0358  NA 135 134*  K 4.4 4.1  CL 105 106  CO2 21* 23  GLUCOSE 180* 118*  BUN <5* <5*  CREATININE 1.05 1.20   No results for input(s): TROPONINI in the last 72 hours.  Invalid input(s): CK, MB Hepatic Function Panel No results for input(s): PROT, ALBUMIN, AST, ALT, ALKPHOS, BILITOT, BILIDIR, IBILI in the last 72 hours. No results for input(s): CHOL in the last 72 hours. No results for input(s): PROTIME in the last 72 hours.  Imaging: Imaging results have been reviewed and No results found.  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 Increase ambulation as tolerated. PT consult. Dr. Doylene Canard on call for weekend   LOS: 7 days    Charolette Forward 05/13/2017, 11:18 AM

## 2017-05-13 NOTE — Progress Notes (Addendum)
[facility] Progress Note  2 Days Post-Op  Subjective: CC: abdominal pain with coughing Patient tells me it hurts whenever he coughs. Pain medication does provide relief whenever he takes it. Tolerating CLD, no nausea. Patient has not gotten out of bed much.   Objective: Vital signs in last 24 hours: Temp:  [97.8 F (36.6 C)-98.9 F (37.2 C)] 98.1 F (36.7 C) (05/02 2333) Pulse Rate:  [75-90] 90 (05/02 2333) Resp:  [16-18] 16 (05/02 2333) BP: (131-160)/(61-76) 131/61 (05/02 2333) SpO2:  [97 %-100 %] 98 % (05/02 2333) Last BM Date: 05/10/17  Intake/Output from previous day: 05/02 0701 - 05/03 0700 In: 1953.8 [P.O.:240; I.V.:1713.8] Out: 21 [Urine:800; Drains:90] Intake/Output this shift: Total I/O In: 42.5 [I.V.:42.5] Out: -   PE: Gen:  Alert, NAD Card:  Regular rate and rhythm, pedal pulses 2+ BL Pulm:  Normal effort, clear to auscultation bilaterally Abd: Soft, appropriately tender, non-distended, bowel sounds hypoactive, no HSM, incision with honeycomb dressing and small amount of blood on dressing; stoma pink, small amount serous drainage in bag; drain with serosanguinous drainage Skin: warm and dry, no rashes  Psych: A&Ox3    Lab Results:  Recent Labs    05/12/17 0220 05/13/17 0358  WBC 14.3* 12.1*  HGB 11.1* 9.6*  HCT 37.2* 32.1*  PLT 287 217   BMET Recent Labs    05/12/17 0220 05/13/17 0358  NA 135 134*  K 4.4 4.1  CL 105 106  CO2 21* 23  GLUCOSE 180* 118*  BUN <5* <5*  CREATININE 1.05 1.20  CALCIUM 7.7* 7.4*   PT/INR No results for input(s): LABPROT, INR in the last 72 hours. CMP     Component Value Date/Time   NA 134 (L) 05/13/2017 0358   K 4.1 05/13/2017 0358   CL 106 05/13/2017 0358   CO2 23 05/13/2017 0358   GLUCOSE 118 (H) 05/13/2017 0358   BUN <5 (L) 05/13/2017 0358   CREATININE 1.20 05/13/2017 0358   CALCIUM 7.4 (L) 05/13/2017 0358   PROT 6.7 05/07/2017 0332   ALBUMIN 2.2 (L) 05/07/2017 0332   AST 14 (L)  05/07/2017 0332   ALT 7 (L) 05/07/2017 0332   ALKPHOS 44 05/07/2017 0332   BILITOT 1.2 05/07/2017 0332   GFRNONAA 59 (L) 05/13/2017 0358   GFRAA >60 05/13/2017 0358   Lipase     Component Value Date/Time   LIPASE 22 05/06/2017 1036       Studies/Results: No results found.  Anti-infectives: Anti-infectives (From admission, onward)   Start     Dose/Rate Route Frequency Ordered Stop   05/11/17 2200  cefoTEtan (CEFOTAN) 2 g in sodium chloride 0.9 % 100 mL IVPB     2 g 200 mL/hr over 30 Minutes Intravenous Every 12 hours 05/11/17 1839 05/11/17 2034   05/11/17 0600  cefoTEtan in Dextrose 5% (CEFOTAN) IVPB 2 g     2 g Intravenous On call to O.R. 05/10/17 1142 05/11/17 1416       Assessment/Plan Coronary artery disease. History of myocardial infarction Hypertension Status post thoracic aortic dissection and stent placement  status post bilateral iliac stent placements COPD CKD II NIDDM Chronic anemia- hgb9.6, continue to monitor Obesity Hypokalemia -improved  Reducible umbilical hernia Rectosigmoid colon mass- S/p LAR with primary colorectal anastomosis and diverting loop ileostomy with umbilical hernia repair 07/16/62 Dr. Georgette Dover - CEAelevated at 69 - POD#2 - change dressing to midline wound, cover stapled incision with dry dressing  - path from colonoscopy is adenocarcinoma, surgical path pending -  oncology consult pending - drain with 90 cc SS - continue - keep on CLD for now and ADAT with bowel function  - OOB and mobilize, PT consulted  FEN:CLD VTE: SCDs, lovenox ZN:BVAPOLIDC periop    LOS: 7 days    Brigid Re , [facility] 05/13/2017, 7:52 AM Pager: [phone] Consults: [phone] Mon-Fri 7:00 am-4:30 pm Sat-Sun 7:00 am-11:30 am

## 2017-05-13 NOTE — Evaluation (Signed)
Physical Therapy Evaluation Patient Details Name: Jesse Le MRN: [id number] DOB: [birth year] Today's Date: 05/13/2017   History of Present Illness  Pt is a 72 y.o. 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 PMH includes HTN, DM, COPD, aortic aneurysm, CKD II, obesity.     Clinical Impression  Pt presents with an overall decrease in functional mobility secondary to above. PTA, pt reports indep at home where he lives with girlfriend. Per granddaughter (spoken to by [facility] RN), pt requires max encouragement to perform ADLs and does not ambulate much outside of home. Today, pt required max encouragement from PT and MD for OOB mobility. Agreeable to transfer to recliner, requiring HHA and modA to prevent LOB. Pt with decreased awareness and poor insight into deficits. Also with decreased UE fine motor which could cause difficulty with ileostomy; pt reports he plans to have RN come to house to "deal with that." Discussed recommendation for SNF-level therapies, but pt not answering whether he is interested in this or not. Will follow acutely to address established goals.     Follow Up Recommendations SNF;Supervision/Assistance - 24 hour    Equipment Recommendations  (TBD)    Recommendations for Other Services       Precautions / Restrictions Precautions Precautions: Fall Precaution Comments: ileostomy bag, abdominal drain Restrictions Weight Bearing Restrictions: No      Mobility  Bed Mobility Overal bed mobility: Needs Assistance Bed Mobility: Rolling;Sidelying to Sit Rolling: Supervision Sidelying to sit: Supervision       General bed mobility comments: Encouraged to use log roll technique secondary to abdominal soreness; supervision for safety/assist with drain  Transfers Overall transfer level: Needs assistance Equipment used: 1 person hand held  assist Transfers: Sit to/from Stand Sit to Stand: Min assist         General transfer comment: Stood with HHA and minA to maintain balance. Poor eccentric control into sitting despite cues  Ambulation/Gait Ambulation/Gait assistance: Mod assist Ambulation Distance (Feet): 2 Feet Assistive device: 1 person hand held assist   Gait velocity: Decreased Gait velocity interpretation: <1.31 ft/sec, indicative of household ambulator General Gait Details: Pt only will to amb from bed to recliner; able to do so with HHA, 1x posterior LOB requiring modA and UE support to correct  Stairs            Wheelchair Mobility    Modified Rankin (Stroke Patients Only)       Balance Overall balance assessment: Needs assistance   Sitting balance-[name]: Good Sitting balance - Comments: Indep to don socks sitting EOB     Standing balance-[name]: Poor Standing balance comment: Reliant on UE support                             Pertinent Vitals/Pain Pain Assessment: Faces Faces Pain Scale: Hurts a little bit Pain Location: Abdomen Pain Descriptors / Indicators: Sore Pain Intervention(s): Monitored during session;Limited activity within patient's tolerance    Home Living Family/patient expects to be discharged to:: Private residence Living Arrangements: Spouse/significant other(Girlfriend) Available Help at Discharge: Family;Available PRN/intermittently Type of Home: Apartment Home Access: Level entry     Home Layout: One level Home Equipment: Cane - single point Additional Comments: Poor historian/not forthcoming with details; agitated with PT's questions regarding home set-up and PLOF    Prior Function Level of Independence: Needs assistance   Gait /  Transfers Assistance Needed: Pt reports indep and no longer uses SPC; does not drive, relies on transport     Comments: Had conversation with [facility] RN who spoke with granddaughter on phone; granddaughter  reports pt requires max encouragement to complete ADLs, requires assist; rarely leaves home     Hand Dominance        Extremity/Trunk Assessment   Upper Extremity Assessment Upper Extremity Assessment: RUE deficits/detail;LUE deficits/detail RUE Coordination: decreased fine motor LUE Coordination: decreased fine motor    Lower Extremity Assessment Lower Extremity Assessment: Generalized weakness       Communication      Cognition Arousal/Alertness: Awake/alert Behavior During Therapy: Flat affect;Agitated Overall Cognitive Status: No family/caregiver present to determine baseline cognitive functioning Area of Impairment: Attention;Following commands;Safety/judgement;Awareness;Problem solving                   Current Attention Level: Sustained   Following Commands: Follows one step commands with increased time Safety/Judgement: Decreased awareness of deficits;Decreased awareness of safety Awareness: Emergent Problem Solving: Slow processing;Requires verbal cues        General Comments General comments (skin integrity, edema, etc.): Required max encouragement from MD and PT to participate    Exercises     Assessment/Plan    PT Assessment Patient needs continued PT services  PT Problem List Decreased strength;Decreased activity tolerance;Decreased balance;Decreased mobility;Decreased cognition;Decreased knowledge of use of DME;Decreased knowledge of precautions       PT Treatment Interventions DME instruction;Gait training;Stair training;Functional mobility training;Therapeutic activities;Therapeutic exercise;Balance training;Patient/family education    PT Goals (Current goals can be found in the Care Plan section)  Acute Rehab PT Goals Patient Stated Goal: Be left alone PT Goal Formulation: With patient Time For Goal Achievement: 05/27/17 Potential to Achieve Goals: Good    Frequency Min 2X/week   Barriers to discharge Decreased caregiver support       Co-evaluation               AM-PAC PT "6 Clicks" Daily Activity  Outcome Measure Difficulty turning over in bed (including adjusting bedclothes, sheets and blankets)?: A Little Difficulty moving from lying on back to sitting on the side of the bed? : A Little Difficulty sitting down on and standing up from a chair with arms (e.g., wheelchair, bedside commode, etc,.)?: Unable Help needed moving to and from a bed to chair (including a wheelchair)?: A Little Help needed walking in hospital room?: A Lot Help needed climbing 3-5 steps with a railing? : A Lot 6 Click Score: 14    End of Session Equipment Utilized During Treatment: Gait belt Activity Tolerance: Patient limited by fatigue Patient left: in chair;with call bell/phone within reach;with chair alarm set Nurse Communication: Mobility status PT Visit Diagnosis: Other abnormalities of gait and mobility (R26.89)    Time: 8469-6295 PT Time Calculation (min) (ACUTE ONLY): 24 min   Charges:   PT Evaluation $PT Eval Moderate Complexity: 1 Mod PT Treatments $Therapeutic Activity: 8-22 mins   PT G Codes:       Mabeline Caras, PT, DPT Acute Rehab Services  Pager: [place] 05/13/2017, 12:06 PM

## 2017-05-13 NOTE — Clinical Social Work Note (Signed)
Clinical Social Work Assessment  Patient Details  Name: Jesse Le MRN: [id number] Date of Birth: 10/09/1945  Date of referral:  05/13/17               Reason for consult:  Facility Placement                Permission sought to share information with:  Chartered certified accountant granted to share information::  Yes, Verbal Permission Granted  Name::     Development worker, international aid::  SNF  Relationship::  Administrator Information:     Housing/Transportation Living arrangements for the past 2 months:  Apartment Source of Information:  Patient, Other (Comment Required)(granddaughter) Patient Interpreter Needed:  None Criminal Activity/Legal Involvement Pertinent to Current Situation/Hospitalization:  No - Comment as needed Significant Relationships:  Adult Children, Significant Other, Other Family Members Lives with:  Significant Other Do you feel safe going back to the place where you live?  No Need for family participation in patient care:  Yes (Comment)(help with decision making at this time)  Care giving concerns:  Pt lives at home with girlfriend.  Per family girlfriend is able to help with some physical needs but is forgetful- not sure if she would be a capable caregiver especially for pt illeostomy needs.   Social Worker assessment / plan:  CSW met with pt at bedside to discuss recommendation for SNF.  Patient listed at oriented x4 but showed clear confusion during conversation and could not follow questioning- was somewhat hostile and thought CSW was implying patient couldn't manage his finances not that there were concerns with mobility.  Patient was getting stressed speaking with CSW and requested I call his granddaughter to discuss.  CSW spoke with dtr and explained PT recommendation for SNF and SNF referral process.  Employment status:  Retired Nurse, adult PT Recommendations:  [place] / Referral  to community resources:  [place]  Patient/Family's Response to care:  Pt granddaughter agreeable to CSW faxing out for SNF but wants to discuss everything with her mom (pt dtr) prior to making an decisions about disposition.  Patient/Family's Understanding of and Emotional Response to Diagnosis, Current Treatment, and Prognosis:  Pt does not seem to have clear understanding of current needs- family is involved and willing to learn whatever is needed to properly care for patient.  Emotional Assessment Appearance:  Appears stated age Attitude/Demeanor/Rapport:  Reactive, Hostile Affect (typically observed):  Agitated Orientation:  Oriented to  Time, Oriented to Place, Oriented to Self, Oriented to Situation Alcohol / Substance use:  Not Applicable Psych involvement (Current and /or in the community):  No (Comment)  Discharge Needs  Concerns to be addressed:  Care Coordination Readmission within the last 30 days:  No Current discharge risk:  Physical Impairment Barriers to Discharge:  Continued Medical Work up   Jorge Ny, LCSW 05/13/2017, 3:36 PM

## 2017-05-14 DIAGNOSIS — R5381 Other malaise: Secondary | ICD-10-CM

## 2017-05-14 DIAGNOSIS — E119 Type 2 diabetes mellitus without complications: Secondary | ICD-10-CM

## 2017-05-14 DIAGNOSIS — K439 Ventral hernia without obstruction or gangrene: Secondary | ICD-10-CM

## 2017-05-14 DIAGNOSIS — I2510 Atherosclerotic heart disease of native coronary artery without angina pectoris: Secondary | ICD-10-CM

## 2017-05-14 LAB — GLUCOSE, CAPILLARY
GLUCOSE-CAPILLARY: 99 mg/dL (ref 65–99)
GLUCOSE-CAPILLARY: 99 mg/dL (ref 65–99)
GLUCOSE-CAPILLARY: 108 mg/dL — AB (ref 65–99)
Glucose-Capillary: 116 mg/dL — ABNORMAL HIGH (ref 65–99)

## 2017-05-14 LAB — BASIC METABOLIC PANEL
ANION GAP: 8 (ref 5–15)
BUN: 8 mg/dL (ref 6–20)
CHLORIDE: 106 mmol/L (ref 101–111)
CO2: 23 mmol/L (ref 22–32)
Calcium: 7.5 mg/dL — ABNORMAL LOW (ref 8.9–10.3)
Creatinine, Ser: 1.15 mg/dL (ref 0.61–1.24)
GFR calc Af Amer: >60 mL/min (ref >60)
GLUCOSE: 124 mg/dL — AB (ref 65–99)
POTASSIUM: 3.9 mmol/L (ref 3.5–5.1)
Sodium: 137 mmol/L (ref 135–145)

## 2017-05-14 LAB — CBC
HEMATOCRIT: 31.1 % — AB (ref 39.0–52.0)
HEMOGLOBIN: 9.3 g/dL — AB (ref 13.0–17.0)
MCH: 24 pg — AB (ref 26.0–34.0)
MCHC: 29.9 g/dL — ABNORMAL LOW (ref 30.0–36.0)
MCV: 80.4 fL (ref 78.0–100.0)
PLATELETS: 236 10*3/uL (ref 150–400)
RBC: 3.87 MIL/uL — ABNORMAL LOW (ref 4.22–5.81)
RDW: 24.9 % — ABNORMAL HIGH (ref 11.5–15.5)
WBC: 13.5 10*3/uL — AB (ref 4.0–10.5)

## 2017-05-14 MED ORDER — PSYLLIUM 95 % PO PACK
1.0000 | PACK | Freq: Two times a day (BID) | ORAL | Status: DC
  Administered 2017-05-14 – 2017-05-17 (×7): 1 via ORAL
  Filled 2017-05-14 (×10): qty 1

## 2017-05-14 MED ORDER — LOPERAMIDE HCL 2 MG PO CAPS: 2.0000 mg | ORAL_CAPSULE | Freq: Three times a day (TID) | ORAL | Status: DC | PRN

## 2017-05-14 MED ORDER — SODIUM CHLORIDE 0.9 % IV SOLN: 250.0000 mL | INTRAVENOUS | Status: DC | PRN

## 2017-05-14 MED ORDER — ALUM & MAG HYDROXIDE-SIMETH 200-200-20 MG/5ML PO SUSP: 30.0000 mL | Freq: Four times a day (QID) | ORAL | Status: DC | PRN

## 2017-05-14 MED ORDER — HYDROCORTISONE 2.5 % RE CREA
1.0000 "application " | TOPICAL_CREAM | Freq: Four times a day (QID) | RECTAL | Status: DC | PRN
  Filled 2017-05-14: qty 28.35

## 2017-05-14 MED ORDER — GABAPENTIN 300 MG PO CAPS
300.0000 mg | ORAL_CAPSULE | Freq: Two times a day (BID) | ORAL | Status: DC
  Administered 2017-05-14 (×2): 300 mg via ORAL
  Filled 2017-05-14 (×3): qty 1

## 2017-05-14 MED ORDER — MENTHOL 3 MG MT LOZG: 1.0000 | LOZENGE | OROMUCOSAL | Status: DC | PRN

## 2017-05-14 MED ORDER — LIP MEDEX EX OINT
1.0000 "application " | TOPICAL_OINTMENT | Freq: Two times a day (BID) | CUTANEOUS | Status: DC
  Administered 2017-05-14 – 2017-06-21 (×67): 1 via TOPICAL
  Filled 2017-05-14 (×3): qty 7

## 2017-05-14 MED ORDER — LOPERAMIDE HCL 2 MG PO CAPS
2.0000 mg | ORAL_CAPSULE | Freq: Every day | ORAL | Status: DC
  Administered 2017-05-14 – 2017-05-16 (×3): 2 mg via ORAL
  Filled 2017-05-14 (×3): qty 1

## 2017-05-14 MED ORDER — ACETAMINOPHEN 500 MG PO TABS
1000.0000 mg | ORAL_TABLET | Freq: Three times a day (TID) | ORAL | Status: DC
  Administered 2017-05-14 (×3): 1000 mg via ORAL
  Filled 2017-05-14 (×4): qty 2

## 2017-05-14 MED ORDER — PROCHLORPERAZINE EDISYLATE 10 MG/2ML IJ SOLN
5.0000 mg | INTRAMUSCULAR | Status: DC | PRN
  Filled 2017-05-14: qty 2

## 2017-05-14 MED ORDER — ASPIRIN EC 81 MG PO TBEC
81.0000 mg | DELAYED_RELEASE_TABLET | Freq: Every day | ORAL | Status: DC
  Administered 2017-05-14 – 2017-05-18 (×4): 81 mg via ORAL
  Filled 2017-05-14 (×5): qty 1

## 2017-05-14 MED ORDER — FERROUS SULFATE 325 (65 FE) MG PO TABS
325.0000 mg | ORAL_TABLET | Freq: Two times a day (BID) | ORAL | Status: DC
  Administered 2017-05-14 – 2017-05-18 (×8): 325 mg via ORAL
  Filled 2017-05-14 (×8): qty 1

## 2017-05-14 MED ORDER — GUAIFENESIN-DM 100-10 MG/5ML PO SYRP: 10.0000 mL | ORAL_SOLUTION | ORAL | Status: DC | PRN

## 2017-05-14 MED ORDER — SODIUM CHLORIDE 0.9% FLUSH: 3.0000 mL | INTRAVENOUS | Status: DC | PRN

## 2017-05-14 MED ORDER — GLIMEPIRIDE 4 MG PO TABS
2.0000 mg | ORAL_TABLET | Freq: Every day | ORAL | Status: DC
  Administered 2017-05-15 – 2017-05-17 (×3): 2 mg via ORAL
  Filled 2017-05-14 (×3): qty 1

## 2017-05-14 MED ORDER — SODIUM CHLORIDE 0.9% FLUSH
3.0000 mL | Freq: Two times a day (BID) | INTRAVENOUS | Status: DC
  Administered 2017-05-14 – 2017-05-17 (×6): 3 mL via INTRAVENOUS

## 2017-05-14 MED ORDER — ENSURE ENLIVE PO LIQD
237.0000 mL | Freq: Two times a day (BID) | ORAL | Status: DC
  Administered 2017-05-14 – 2017-05-17 (×3): 237 mL via ORAL

## 2017-05-14 MED ORDER — ENSURE SURGERY PO LIQD: 237.0000 mL | Freq: Two times a day (BID) | ORAL | Status: DC

## 2017-05-14 MED ORDER — LACTATED RINGERS IV BOLUS: 1000.0000 mL | Freq: Three times a day (TID) | INTRAVENOUS | Status: AC | PRN

## 2017-05-14 MED ORDER — HYDROCORTISONE 1 % EX CREA
1.0000 "application " | TOPICAL_CREAM | Freq: Three times a day (TID) | CUTANEOUS | Status: DC | PRN
  Filled 2017-05-14: qty 28

## 2017-05-14 MED ORDER — PHENOL 1.4 % MT LIQD: 1.0000 | OROMUCOSAL | Status: DC | PRN

## 2017-05-14 MED ORDER — METHOCARBAMOL 500 MG PO TABS
1000.0000 mg | ORAL_TABLET | Freq: Four times a day (QID) | ORAL | Status: DC | PRN
  Administered 2017-05-16 – 2017-05-17 (×2): 1000 mg via ORAL

## 2017-05-14 NOTE — Social Work (Signed)
Pt SNF offers sent to [email or web address], per request of floor CSW.   Alexander Mt, [place] Work [phone]

## 2017-05-14 NOTE — Progress Notes (Signed)
[facility]  [address] [name], [place] [address] Phone: [phone]  FAX: [phone]      Jesse Le [id number] Aug 12, 1945  CARE TEAM:  PCP: Charolette Forward, MD  Outpatient Care Team: Patient Care Team: Charolette Forward, MD as PCP - General (Internal Medicine)  Inpatient Treatment Team: Treatment Team: Attending Provider: Charolette Forward, MD; Consulting Physician: Charolette Forward, MD; Consulting Physician: Edison Pace, Md, MD; Social Worker: Rudean Curt; Registered Nurse: Jeb Levering, RN; Registered Nurse: Meryle Ready, RN; Social Worker: Andris Flurry   Problem List:   Principal Problem:   Cancer of sigmoid s/p LAR rectosigmoid resection 05/11/2017 Active Problems:   Physical deconditioning   CAD (coronary atherosclerotic disease)   Generalized abdominal pain   Iron deficiency anemia due to chronic blood loss   Abnormal CT scan, sigmoid colon   Acute blood loss anemia   Ventral hernia s/p primary repair 05/11/2017   Diabetes mellitus without complication ([place])   3 Days Post-Op  05/11/2017  Preop diagnosis: Rectosigmoid adenocarcinoma, large reducible umbilical hernia Postop diagnosis: Same Procedure performed: Low anterior resection with primary colorectal anastomosis and diverting loop ileostomy, rigid proctoscopy, umbilical hernia repair Surgeon:Matthew K Tsuei     Assessment  FAIR  Plan:  -CANCER  -path from colonoscopy is adenocarcinoma, surgical path pending - oncology consult pending FEN:Adv diet.  Follow off IVF w PRN boluses.  High risk dehydration w protecting ileostomy - may need PICC & IVF x 6 weeks Ileostomy care ILEOSTOMY diarrhea: fiber bowel regimen.  Iron.  Loperamide QHS & PRN VTE: SCDs, lovenox JK:[id number] periop HTN - lower BP meds since having ileostomy = 80 lasix BID -DM control - OOB and mobilize, PT consulted -deconditioned - will need SNF  35 minutes spent in  review, evaluation, examination, counseling, and coordination of care.  More than 50% of that time was spent in counseling.  Jesse Le, M.D., F.A.C.S. Gastrointestinal and Minimally Invasive Surgery [facility] [address] [place], [place] [address] [phone] Main / Paging   05/14/2017    Subjective: (Chief complaint)  Sleeping tol liquids  Objective:  Vital signs:  Vitals:   05/13/17 0841 05/13/17 1541 05/13/17 2213 05/14/17 0745  BP: (!) 158/69 111/66 136/66 122/65  Pulse: 96 92 90 75  Resp: 20 (!) 28    Temp: 99 F (37.2 C) 98.2 F (36.8 C) 98.3 F (36.8 C) (!) 97.3 F (36.3 C)  TempSrc: Oral Oral  Oral  SpO2: 97% 100% 98%   Weight:      Height:        Last BM Date: 05/14/17  Intake/Output   Yesterday:  05/03 0701 - 05/04 0700 In: 582.5 [P.O.:540; I.V.:42.5] Out: 2575 [Urine:1275; Drains:140; Stool:1160] This shift:  Total I/O In: 300 [P.O.:300] Out: -   Bowel function:  Flatus: YES  BM:  YES  Drain: Serosanguinous   Physical Exam:  General: Pt awake/alert/oriented x4 in no acute distress Eyes: PERRL, normal EOM.  Sclera clear.  No icterus Neuro: CN II-XII intact w/o focal sensory/motor deficits. Lymph: No head/neck/groin lymphadenopathy Psych:  No delerium/psychosis/paranoia HENT: Normocephalic, Mucus membranes moist.  No thrush Neck: Supple, No tracheal deviation Chest: No chest wall pain w good excursion CV:  Pulses intact.  Regular rhythm MS: Normal AROM mjr joints.  No obvious deformity  Abdomen: Soft.  Mildy distended.  Mildly tender at incisions only.  Ileostomy pink w succus/gasNo evidence of peritonitis.  No  incarcerated hernias.  Ext:  No deformity.  No mjr edema.  No cyanosis Skin: No petechiae / purpura  Results:   Labs: Results for orders placed or performed during the hospital encounter of 05/06/17 (from the past 48 hour(s))  Glucose, capillary     Status: Abnormal    Collection Time: 05/12/17 11:32 AM  Result Value Ref Range   Glucose-Capillary 135 (H) 65 - 99 mg/dL   Comment 1 Notify RN   Glucose, capillary     Status: Abnormal   Collection Time: 05/12/17  3:23 PM  Result Value Ref Range   Glucose-Capillary 124 (H) 65 - 99 mg/dL  Glucose, capillary     Status: Abnormal   Collection Time: 05/12/17  9:06 PM  Result Value Ref Range   Glucose-Capillary 115 (H) 65 - 99 mg/dL  CBC     Status: Abnormal   Collection Time: 05/13/17  3:58 AM  Result Value Ref Range   WBC 12.1 (H) 4.0 - 10.5 K/uL   RBC 4.05 (L) 4.22 - 5.81 MIL/uL   Hemoglobin 9.6 (L) 13.0 - 17.0 g/dL   HCT 32.1 (L) 39.0 - 52.0 %   MCV 79.3 78.0 - 100.0 fL   MCH 23.7 (L) 26.0 - 34.0 pg   MCHC 29.9 (L) 30.0 - 36.0 g/dL   RDW 24.4 (H) 11.5 - 15.5 %   Platelets 217 150 - 400 K/uL    Comment: Performed at [facility], [address] [place], [place] [address]  Basic metabolic panel     Status: Abnormal   Collection Time: 05/13/17  3:58 AM  Result Value Ref Range   Sodium 134 (L) 135 - 145 mmol/L   Potassium 4.1 3.5 - 5.1 mmol/L   Chloride 106 101 - 111 mmol/L   CO2 23 22 - 32 mmol/L   Glucose, Bld 118 (H) 65 - 99 mg/dL   BUN <5 (L) 6 - 20 mg/dL   Creatinine, Ser 1.20 0.61 - 1.24 mg/dL   Calcium 7.4 (L) 8.9 - 10.3 mg/dL   GFR calc non Af Amer 59 (L) >60 mL/min   GFR calc Af Amer >60 >60 mL/min    Comment: (NOTE) The eGFR has been calculated using the CKD EPI equation. This calculation has not been validated in all clinical situations. eGFR's persistently <60 mL/min signify possible Chronic Kidney Disease.    Anion gap 5 5 - 15    Comment: Performed at [facility] [address] [place], [place] [address]  Glucose, capillary     Status: Abnormal   Collection Time: 05/13/17  7:39 AM  Result Value Ref Range   Glucose-Capillary 123 (H) 65 - 99 mg/dL  Glucose, capillary     Status: Abnormal   Collection Time: 05/13/17 11:37 AM  Result Value Ref Range    Glucose-Capillary 107 (H) 65 - 99 mg/dL  Glucose, capillary     Status: Abnormal   Collection Time: 05/13/17  3:40 PM  Result Value Ref Range   Glucose-Capillary 102 (H) 65 - 99 mg/dL  Glucose, capillary     Status: Abnormal   Collection Time: 05/13/17  9:26 PM  Result Value Ref Range   Glucose-Capillary 118 (H) 65 - 99 mg/dL  CBC     Status: Abnormal   Collection Time: 05/14/17  4:02 AM  Result Value Ref Range   WBC 13.5 (H) 4.0 - 10.5 K/uL   RBC 3.87 (L) 4.22 - 5.81 MIL/uL   Hemoglobin 9.3 (L) 13.0 -  17.0 g/dL   HCT 31.1 (L) 39.0 - 52.0 %   MCV 80.4 78.0 - 100.0 fL   MCH 24.0 (L) 26.0 - 34.0 pg   MCHC 29.9 (L) 30.0 - 36.0 g/dL   RDW 24.9 (H) 11.5 - 15.5 %   Platelets 236 150 - 400 K/uL    Comment: Performed at [facility] [address], [place], [place] [address]  Basic metabolic panel     Status: Abnormal   Collection Time: 05/14/17  4:02 AM  Result Value Ref Range   Sodium 137 135 - 145 mmol/L   Potassium 3.9 3.5 - 5.1 mmol/L   Chloride 106 101 - 111 mmol/L   CO2 23 22 - 32 mmol/L   Glucose, Bld 124 (H) 65 - 99 mg/dL   BUN 8 6 - 20 mg/dL   Creatinine, Ser 1.15 0.61 - 1.24 mg/dL   Calcium 7.5 (L) 8.9 - 10.3 mg/dL   GFR calc non Af Amer >60 >60 mL/min   GFR calc Af Amer >60 >60 mL/min    Comment: (NOTE) The eGFR has been calculated using the CKD EPI equation. This calculation has not been validated in all clinical situations. eGFR's persistently <60 mL/min signify possible Chronic Kidney Disease.    Anion gap 8 5 - 15    Comment: Performed at [facility] [address] [place], [place] [address]    Imaging / Studies: No results found.  Medications / Allergies: per chart  Antibiotics: Anti-infectives (From admission, onward)   Start     Dose/Rate Route Frequency Ordered Stop   05/11/17 2200  cefoTEtan (CEFOTAN) 2 g in sodium chloride 0.9 % 100 mL IVPB     2 g 200 mL/hr over 30 Minutes Intravenous Every 12 hours 05/11/17 1839 05/11/17 2034    05/11/17 0600  cefoTEtan in Dextrose 5% (CEFOTAN) IVPB 2 g     2 g Intravenous On call to O.R. 05/10/17 1142 05/11/17 1416        Note: Portions of this report may have been transcribed using voice recognition software. Every effort was made to ensure accuracy; however, inadvertent computerized transcription errors may be present.   Any transcriptional errors that result from this process are unintentional.     Jesse Le, M.D., F.A.C.S. Gastrointestinal and Minimally Invasive Surgery [facility] [address] [place], [place] [address] [phone] Main / Paging   05/14/2017

## 2017-05-14 NOTE — Progress Notes (Signed)
Nutrition Follow-up  DOCUMENTATION CODES:   Not applicable  INTERVENTION:   -Initiate 48 hour calorie count -D/c Boost Breeze po TID, each supplement provides 250 kcal and 9 grams of protein -Ensure Enlive po BID, each supplement provides 350 kcal and 20 grams of protein -Provided "Ileostomy Nutrition Therapy" handout from AND's (Academy [employer] Dietetics) Nutrition Care Manual; RD contact information provided- will reattempt to educate when family is present  NUTRITION DIAGNOSIS:   Increased nutrient needs related to (cancer) as evidenced by estimated needs.  Ongoing  GOAL:   Patient will meet greater than or equal to 90% of their needs  Progressing  MONITOR:   PO intake, Supplement acceptance, Diet advancement, Weight trends, Labs, Skin, I & O's  REASON FOR ASSESSMENT:   Consult Diet education, Calorie Count  ASSESSMENT:   72 year old male here with iron deficiency anemia with recent rectal bleeding found to have abnormal CT scan of the abdomen and pelvis suggestive of a sigmoid cancer.  He has a history of peripheral vascular disease, aortic aneurysm with endovascular repair, CAD, hypertension, hyperlipidemia, COPD, CKD.  5/1  resection of rectosigmoid adenocarcinoma with primary colorectal anastomosis and diverting loop ileostomy and umbilical hernia repair  Pt sleeping soundly at time of visit; unable to arouse pt enough to fully engage in interview or education session. No family present at bedside. Provided educational handout at bedside (with RD contact information) for pt and family to review. RD will re-attempt education when family is present.   Boost Breeze noted on table unopened. Pt's intake has been variable; noted 5-100% meal completion over the past 24 hours. MD has ordered 48 hour calorie count. RD will order Ensure Enlive supplements due to diet advancement and to assist with promoting nutritional adequacy.   Per CSW notes, plan for home vs SNF  placement.   Labs reviewed: CBGS: 99-118 (inpatient orders for glycemic contorl are 0-9 units insulin aspart TID with meals).   Diet Order:   Diet Order           Diet Heart Room service appropriate? Yes; Fluid consistency: Thin  Diet effective now          EDUCATION NEEDS:   Not appropriate for education at this time  Skin:  Skin Assessment: Skin Integrity Issues: Skin Integrity Issues:: Incisions Incisions: closed abdomen  Last BM:  05/14/17 (60 ml via ileostomy)  Height:   Ht Readings from Last 1 Encounters:  05/11/17 6' 0.99" (1.854 m)    Weight:   Wt Readings from Last 1 Encounters:  05/11/17 208 lb 5.4 oz (94.5 kg)    Ideal Body Weight:  83.6 kg  BMI:  Body mass index is 27.49 kg/m.  Estimated Nutritional Needs:   Kcal:  2100-2400  Protein:  105-120 grams  Fluid:  2.1 - 2.4 L/day    Kionte Baumgardner A. Jimmye Norman, RD, LDN, CDE Pager: [phone] After hours Pager: [phone]

## 2017-05-14 NOTE — Progress Notes (Signed)
Ref: Jesse Forward, MD   Subjective:  Awake S/P Low Anterior Resection, rectosigmoid resection diverting loop ileostomy for cancer of sigmoid. Afebrile. Mild respiratory distress.  Objective:  Vital Signs in the last 24 hours: Temp:  [97.3 F (36.3 C)-98.3 F (36.8 C)] 97.3 F (36.3 C) (05/04 0745) Pulse Rate:  [75-92] 75 (05/04 0745) Cardiac Rhythm: Normal sinus rhythm (05/04 0809) Resp:  [28] 28 (05/03 1541) BP: (111-136)/(65-66) 122/65 (05/04 0745) SpO2:  [98 %-100 %] 98 % (05/03 2213)  Physical Exam: BP Readings from Last 1 Encounters:  05/14/17 122/65     Wt Readings from Last 1 Encounters:  05/11/17 94.5 kg (208 lb 5.4 oz)    Weight change:  Body mass index is 27.49 kg/m. HEENT: Jesse Le/AT, Eyes-Brown, Conjunctiva-Pale pink, Sclera-Non-icteric Neck: No JVD, No bruit, Trachea midline. Lungs:  Clear, Bilateral. Cardiac:  Regular rhythm, normal S1 and S2, no S3. II/VI systolic murmur. Abdomen:  Soft, non-tender. BS present. Dressing over lower abdomen with drain. Extremities:  No edema present. No cyanosis. No clubbing. CNS: AxOx3, Cranial nerves grossly intact, moves all 4 extremities.  Skin: Warm and dry.   Intake/Output from previous day: 05/03 0701 - 05/04 0700 In: 582.5 [P.O.:540; I.V.:42.5] Out: 2575 [Urine:1275; Drains:140; Stool:1160]    Lab Results: BMET    Component Value Date/Time   NA 137 05/14/2017 0402   NA 134 (L) 05/13/2017 0358   NA 135 05/12/2017 0220   K 3.9 05/14/2017 0402   K 4.1 05/13/2017 0358   K 4.4 05/12/2017 0220   CL 106 05/14/2017 0402   CL 106 05/13/2017 0358   CL 105 05/12/2017 0220   CO2 23 05/14/2017 0402   CO2 23 05/13/2017 0358   CO2 21 (L) 05/12/2017 0220   GLUCOSE 124 (H) 05/14/2017 0402   GLUCOSE 118 (H) 05/13/2017 0358   GLUCOSE 180 (H) 05/12/2017 0220   BUN 8 05/14/2017 0402   BUN <5 (L) 05/13/2017 0358   BUN <5 (L) 05/12/2017 0220   CREATININE 1.15 05/14/2017 0402   CREATININE 1.20 05/13/2017 0358   CREATININE  1.05 05/12/2017 0220   CALCIUM 7.5 (L) 05/14/2017 0402   CALCIUM 7.4 (L) 05/13/2017 0358   CALCIUM 7.7 (L) 05/12/2017 0220   GFRNONAA >60 05/14/2017 0402   GFRNONAA 59 (L) 05/13/2017 0358   GFRNONAA >60 05/12/2017 0220   GFRAA >60 05/14/2017 0402   GFRAA >60 05/13/2017 0358   GFRAA >60 05/12/2017 0220   CBC    Component Value Date/Time   WBC 13.5 (H) 05/14/2017 0402   RBC 3.87 (L) 05/14/2017 0402   HGB 9.3 (L) 05/14/2017 0402   HCT 31.1 (L) 05/14/2017 0402   PLT 236 05/14/2017 0402   MCV 80.4 05/14/2017 0402   MCH 24.0 (L) 05/14/2017 0402   MCHC 29.9 (L) 05/14/2017 0402   RDW 24.9 (H) 05/14/2017 0402   LYMPHSABS 1.5 10/09/2011 1013   MONOABS 0.5 10/09/2011 1013   EOSABS 0.1 10/09/2011 1013   BASOSABS 0.0 10/09/2011 1013   HEPATIC Function Panel Recent Labs    05/06/17 1036 05/07/17 0332  PROT 7.4 6.7   HEMOGLOBIN A1C No components found for: HGA1C,  MPG CARDIAC ENZYMES No results found for: CKTOTAL, CKMB, CKMBINDEX, TROPONINI BNP No results for input(s): PROBNP in the last 8760 hours. TSH No results for input(s): TSH in the last 8760 hours. CHOLESTEROL No results for input(s): CHOL in the last 8760 hours.  Scheduled Meds: . acetaminophen  1,000 mg Oral TID  . amLODipine  5 mg Oral  Daily  . aspirin EC  81 mg Oral Daily  . enoxaparin (LOVENOX) injection  40 mg Subcutaneous Q24H  . feeding supplement  1 Container Oral TID BM  . ferrous sulfate  325 mg Oral BID WC  . gabapentin  300 mg Oral BID  . [START ON 05/15/2017] glimepiride  2 mg Oral Q breakfast  . insulin aspart  0-9 Units Subcutaneous TID WC  . lip balm  1 application Topical BID  . loperamide  2 mg Oral QHS  . methocarbamol  500 mg Oral QID  . metoprolol tartrate  25 mg Oral BID  . pantoprazole  40 mg Oral Q0600  . psyllium  1 packet Oral BID  . sodium chloride flush  3 mL Intravenous Q12H   Continuous Infusions: . sodium chloride    . ferumoxytol Stopped (05/07/17 1423)  . 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 Awaiting surgical pathology.    LOS: 8 days    [name]  MD  05/14/2017, 12:04 PM

## 2017-05-15 LAB — GLUCOSE, CAPILLARY
GLUCOSE-CAPILLARY: 100 mg/dL — AB (ref 65–99)
Glucose-Capillary: 112 mg/dL — ABNORMAL HIGH (ref 65–99)
Glucose-Capillary: 103 mg/dL — ABNORMAL HIGH (ref 65–99)

## 2017-05-15 MED ORDER — KCL IN DEXTROSE-NACL 20-5-0.9 MEQ/L-%-% IV SOLN
INTRAVENOUS | Status: DC
  Administered 2017-05-15 – 2017-05-16 (×2): via INTRAVENOUS
  Filled 2017-05-15 (×5): qty 1000

## 2017-05-15 MED ORDER — OXYCODONE HCL 5 MG PO TABS
10.0000 mg | ORAL_TABLET | ORAL | Status: DC | PRN
  Administered 2017-05-16: 10 mg via ORAL
  Filled 2017-05-15: qty 2

## 2017-05-15 MED ORDER — ACETAMINOPHEN 500 MG PO TABS
1000.0000 mg | ORAL_TABLET | Freq: Three times a day (TID) | ORAL | Status: DC
  Administered 2017-05-15 – 2017-05-16 (×4): 1000 mg via ORAL
  Filled 2017-05-15 (×4): qty 2

## 2017-05-15 MED ORDER — METHOCARBAMOL 500 MG PO TABS
500.0000 mg | ORAL_TABLET | Freq: Four times a day (QID) | ORAL | Status: DC
  Administered 2017-05-15 – 2017-05-16 (×4): 500 mg via ORAL
  Administered 2017-05-17: 1000 mg via ORAL
  Administered 2017-05-17: 500 mg via ORAL
  Administered 2017-05-17: 1000 mg via ORAL
  Filled 2017-05-15 (×4): qty 1
  Filled 2017-05-15 (×2): qty 2
  Filled 2017-05-15: qty 1
  Filled 2017-05-15 (×2): qty 2

## 2017-05-15 MED ORDER — GABAPENTIN 300 MG PO CAPS
300.0000 mg | ORAL_CAPSULE | Freq: Three times a day (TID) | ORAL | Status: DC
  Administered 2017-05-15 – 2017-05-19 (×10): 300 mg via ORAL
  Filled 2017-05-15 (×11): qty 1

## 2017-05-15 NOTE — Progress Notes (Signed)
Ref: Charolette Forward, MD   Subjective:  Lower abdominal pain continues. Afebrile.   Objective:  Vital Signs in the last 24 hours: Temp:  [97.5 F (36.4 C)-98.4 F (36.9 C)] 97.6 F (36.4 C) (05/05 0757) Pulse Rate:  [75-82] 75 (05/05 0757) Cardiac Rhythm: Normal sinus rhythm (05/05 0757) Resp:  [16-18] 18 (05/05 0757) BP: (121-141)/(62-68) 141/62 (05/05 0757) SpO2:  [96 %-97 %] 96 % (05/05 0757)  Physical Exam: BP Readings from Last 1 Encounters:  05/15/17 (!) 141/62     Wt Readings from Last 1 Encounters:  05/11/17 94.5 kg (208 lb 5.4 oz)    Weight change:  Body mass index is 27.49 kg/m. HEENT: [place]/AT, Eyes-Brown, Conjunctiva-Pale pink, Sclera-Non-icteric Neck: No JVD, No bruit, Trachea midline. Lungs:  Clear, Bilateral. Cardiac:  Regular rhythm, normal S1 and S2, no S3. II/VI systolic murmur. Abdomen:  Soft, lower abdominal-tenderness. BS present. Dressing with drain. Extremities:  No edema present. No cyanosis. No clubbing. CNS: AxOx3, Cranial nerves grossly intact, moves all 4 extremities.  Skin: Warm and dry.   Intake/Output from previous day: 05/04 0701 - 05/05 0700 In: 66 [P.O.:890] Out: 1220 [Urine:450; Drains:290; Stool:480]    Lab Results: BMET    Component Value Date/Time   NA 137 05/14/2017 0402   NA 134 (L) 05/13/2017 0358   NA 135 05/12/2017 0220   K 3.9 05/14/2017 0402   K 4.1 05/13/2017 0358   K 4.4 05/12/2017 0220   CL 106 05/14/2017 0402   CL 106 05/13/2017 0358   CL 105 05/12/2017 0220   CO2 23 05/14/2017 0402   CO2 23 05/13/2017 0358   CO2 21 (L) 05/12/2017 0220   GLUCOSE 124 (H) 05/14/2017 0402   GLUCOSE 118 (H) 05/13/2017 0358   GLUCOSE 180 (H) 05/12/2017 0220   BUN 8 05/14/2017 0402   BUN <5 (L) 05/13/2017 0358   BUN <5 (L) 05/12/2017 0220   CREATININE 1.15 05/14/2017 0402   CREATININE 1.20 05/13/2017 0358   CREATININE 1.05 05/12/2017 0220   CALCIUM 7.5 (L) 05/14/2017 0402   CALCIUM 7.4 (L) 05/13/2017 0358   CALCIUM 7.7 (L)  05/12/2017 0220   GFRNONAA >60 05/14/2017 0402   GFRNONAA 59 (L) 05/13/2017 0358   GFRNONAA >60 05/12/2017 0220   GFRAA >60 05/14/2017 0402   GFRAA >60 05/13/2017 0358   GFRAA >60 05/12/2017 0220   CBC    Component Value Date/Time   WBC 13.5 (H) 05/14/2017 0402   RBC 3.87 (L) 05/14/2017 0402   HGB 9.3 (L) 05/14/2017 0402   HCT 31.1 (L) 05/14/2017 0402   PLT 236 05/14/2017 0402   MCV 80.4 05/14/2017 0402   MCH 24.0 (L) 05/14/2017 0402   MCHC 29.9 (L) 05/14/2017 0402   RDW 24.9 (H) 05/14/2017 0402   LYMPHSABS 1.5 10/09/2011 1013   MONOABS 0.5 10/09/2011 1013   EOSABS 0.1 10/09/2011 1013   BASOSABS 0.0 10/09/2011 1013   HEPATIC Function Panel Recent Labs    05/06/17 1036 05/07/17 0332  PROT 7.4 6.7   HEMOGLOBIN A1C No components found for: HGA1C,  MPG CARDIAC ENZYMES No results found for: CKTOTAL, CKMB, CKMBINDEX, TROPONINI BNP No results for input(s): PROBNP in the last 8760 hours. TSH No results for input(s): TSH in the last 8760 hours. CHOLESTEROL No results for input(s): CHOL in the last 8760 hours.  Scheduled Meds: . acetaminophen  1,000 mg Oral TID AC & HS  . amLODipine  5 mg Oral Daily  . aspirin EC  81 mg Oral Daily  .  enoxaparin (LOVENOX) injection  40 mg Subcutaneous Q24H  . feeding supplement (ENSURE ENLIVE)  237 mL Oral BID BM  . ferrous sulfate  325 mg Oral BID WC  . gabapentin  300 mg Oral TID  . glimepiride  2 mg Oral Q breakfast  . insulin aspart  0-9 Units Subcutaneous TID WC  . lip balm  1 application Topical BID  . loperamide  2 mg Oral QHS  . methocarbamol  500-1,000 mg Oral QID  . metoprolol tartrate  25 mg Oral BID  . pantoprazole  40 mg Oral Q0600  . psyllium  1 packet Oral BID  . sodium chloride flush  3 mL Intravenous Q12H   Continuous Infusions: . sodium chloride    . 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 Small dose IV fluids and potassium for expected increase loss   LOS: 9 days    [name]  MD  05/15/2017, 12:00 PM

## 2017-05-15 NOTE — Progress Notes (Signed)
PT Cancellation Note  Patient Details Name: Jesse Le MRN: [id number] DOB: [birth year]   Cancelled Treatment:    Reason Eval/Treat Not Completed: Patient declined, no reason specified Patient refuses to work with therapy today. Explained risks of complications due to non-compliance and being immobile following this operation. Still refuses, stating "I won't do it. Not today I won't."  Ellouise Newer 05/15/2017, 1:22 PM

## 2017-05-15 NOTE — Clinical Social Work Note (Signed)
CSW spoke to patient's granddaughter Aram Beecham [phone] to discuss SNF options that were emailed to her yesterday.  Patient's grandaughter stated she has not had a chance to review the options with patient and his wife.  CSW was informed by patient's granddaughter, that they will be discussing with physician tomorrow, if home would be an option with home health services.  CSW will update weekday social worker to follow up with patient's granddaughter.  Jones Broom. [place], MSW, [place]  05/15/2017 3:21 PM

## 2017-05-15 NOTE — Progress Notes (Signed)
[facility]  [address] [place], [place] [address] Phone: [phone]  FAX: [phone]      Jesse Le [id number] Jun 11, 1945  CARE TEAM:  PCP: Charolette Forward, MD  Outpatient Care Team: Patient Care Team: Charolette Forward, MD as PCP - General (Internal Medicine)  Inpatient Treatment Team: Treatment Team: Attending Provider: Charolette Forward, MD; Consulting Physician: Charolette Forward, MD; Consulting Physician: Edison Pace, Md, MD; Social Worker: Rudean Curt; Registered Nurse: Jeb Levering, RN; Registered Nurse: Meryle Ready, RN; Physical Therapist: Ellouise Newer, PT; Registered Nurse: Hollie Salk, RN   Problem List:   Principal Problem:   Cancer of sigmoid s/p LAR rectosigmoid resection 05/11/2017 Active Problems:   Physical deconditioning   CAD (coronary atherosclerotic disease)   Generalized abdominal pain   Iron deficiency anemia due to chronic blood loss   Abnormal CT scan, sigmoid colon   Acute blood loss anemia   Ventral hernia s/p primary repair 05/11/2017   Diabetes mellitus without complication ([place])   4 Days Post-Op  05/11/2017  Preop diagnosis: Rectosigmoid adenocarcinoma, large reducible umbilical hernia Postop diagnosis: Same Procedure performed: Low anterior resection with primary colorectal anastomosis and diverting loop ileostomy, rigid proctoscopy, umbilical hernia repair Surgeon:Matthew K Tsuei     Assessment  FAIR  Plan:  -RECTOSIGMOID COLON CANCER -path from colonoscopy is adenocarcinoma, surgical path pending - oncology consult pending  EML:JQGBE diet.  Follow off IVF w PRN boluses.  High risk dehydration w protecting ileostomy - may need PICC & IVF x 6 weeks  Ileostomy care  ILEOSTOMY diarrhea: fiber bowel regimen.  Iron.  Loperamide QHS & PRN  VTE: SCDs, lovenox  EF:[id number] periop  HTN - lower BP meds since having ileostomy = 80 lasix BID  -DM control  - OOB and  mobilize, PT consulted.  Get him up!!!!!!  -deconditioned - will need SNF  30 minutes spent in review, evaluation, examination, counseling, and coordination of care.  More than 50% of that time was spent in counseling.  Jesse Le, M.D., F.A.C.S. Gastrointestinal and Minimally Invasive Surgery [facility] [address] [place], [place] [address] [phone] Main / Paging   05/15/2017    Subjective: (Chief complaint)  Sore Tol solids - spit up x 1 yesterday - not now Crampy pain  Objective:  Vital signs:  Vitals:   05/14/17 1636 05/14/17 2130 05/14/17 2327 05/15/17 0757  BP: 131/68 121/63 124/64 (!) 141/62  Pulse:  82 76 75  Resp: '16  16 18  '$ Temp: (!) 97.5 F (36.4 C) 97.7 F (36.5 C) 98.4 F (36.9 C) 97.6 F (36.4 C)  TempSrc: Oral Oral Oral Oral  SpO2: 96%  97% 96%  Weight:      Height:        Last BM Date: 05/14/17  Intake/Output   Yesterday:  05/04 0701 - 05/05 0700 In: 25 [P.O.:890] Out: 1220 [Urine:450; Drains:290; Stool:480] This shift:  No intake/output data recorded.  Bowel function:  Flatus: YES  BM:  YES  Drain: Serosanguinous   Physical Exam:  General: Pt awake/alert/oriented x4 in no acute distress Eyes: PERRL, normal EOM.  Sclera clear.  No icterus Neuro: CN II-XII intact w/o focal sensory/motor deficits. Lymph: No head/neck/groin lymphadenopathy Psych:  No delerium/psychosis/paranoia HENT: Normocephalic, Mucus membranes moist.  No thrush Neck: Supple, No tracheal deviation Chest: No chest wall pain w good excursion CV:  Pulses intact.  Regular rhythm MS: Normal AROM mjr joints.  No obvious deformity  Abdomen: Soft.  Moderately distended.  Tenderness at lower incision - moderate.  Dressing w old drainage.  Ileostomy pink w succus/gas.  No evidence of peritonitis.  No incarcerated hernias.  Ext:  No deformity.  No mjr edema.  No cyanosis Skin: No petechiae / purpura  Results:    Labs: Results for orders placed or performed during the hospital encounter of 05/06/17 (from the past 48 hour(s))  Glucose, capillary     Status: Abnormal   Collection Time: 05/13/17 11:37 AM  Result Value Ref Range   Glucose-Capillary 107 (H) 65 - 99 mg/dL  Glucose, capillary     Status: Abnormal   Collection Time: 05/13/17  3:40 PM  Result Value Ref Range   Glucose-Capillary 102 (H) 65 - 99 mg/dL  Glucose, capillary     Status: Abnormal   Collection Time: 05/13/17  9:26 PM  Result Value Ref Range   Glucose-Capillary 118 (H) 65 - 99 mg/dL  CBC     Status: Abnormal   Collection Time: 05/14/17  4:02 AM  Result Value Ref Range   WBC 13.5 (H) 4.0 - 10.5 K/uL   RBC 3.87 (L) 4.22 - 5.81 MIL/uL   Hemoglobin 9.3 (L) 13.0 - 17.0 g/dL   HCT 31.1 (L) 39.0 - 52.0 %   MCV 80.4 78.0 - 100.0 fL   MCH 24.0 (L) 26.0 - 34.0 pg   MCHC 29.9 (L) 30.0 - 36.0 g/dL   RDW 24.9 (H) 11.5 - 15.5 %   Platelets 236 150 - 400 K/uL    Comment: Performed at [facility], [address], [place], [place] [address]  Basic metabolic panel     Status: Abnormal   Collection Time: 05/14/17  4:02 AM  Result Value Ref Range   Sodium 137 135 - 145 mmol/L   Potassium 3.9 3.5 - 5.1 mmol/L   Chloride 106 101 - 111 mmol/L   CO2 23 22 - 32 mmol/L   Glucose, Bld 124 (H) 65 - 99 mg/dL   BUN 8 6 - 20 mg/dL   Creatinine, Ser 1.15 0.61 - 1.24 mg/dL   Calcium 7.5 (L) 8.9 - 10.3 mg/dL   GFR calc non Af Amer >60 >60 mL/min   GFR calc Af Amer >60 >60 mL/min    Comment: (NOTE) The eGFR has been calculated using the CKD EPI equation. This calculation has not been validated in all clinical situations. eGFR's persistently <60 mL/min signify possible Chronic Kidney Disease.    Anion gap 8 5 - 15    Comment: Performed at [facility] [address] [place], [place] [address]  Glucose, capillary     Status: Abnormal   Collection Time: 05/14/17  7:39 AM  Result Value Ref Range   Glucose-Capillary 116 (H) 65 -  99 mg/dL  Glucose, capillary     Status: None   Collection Time: 05/14/17 12:06 PM  Result Value Ref Range   Glucose-Capillary 99 65 - 99 mg/dL  Glucose, capillary     Status: None   Collection Time: 05/14/17  4:42 PM  Result Value Ref Range   Glucose-Capillary 99 65 - 99 mg/dL  Glucose, capillary     Status: Abnormal   Collection Time: 05/14/17  9:17 PM  Result Value Ref Range   Glucose-Capillary 108 (H) 65 - 99 mg/dL    Imaging / Studies: No results found.  Medications / Allergies: per chart  Antibiotics: Anti-infectives (From admission, onward)   Start  Dose/Rate Route Frequency Ordered Stop   05/11/17 2200  cefoTEtan (CEFOTAN) 2 g in sodium chloride 0.9 % 100 mL IVPB     2 g 200 mL/hr over 30 Minutes Intravenous Every 12 hours 05/11/17 1839 05/11/17 2034   05/11/17 0600  cefoTEtan in Dextrose 5% (CEFOTAN) IVPB 2 g     2 g Intravenous On call to O.R. 05/10/17 1142 05/11/17 1416        Note: Portions of this report may have been transcribed using voice recognition software. Every effort was made to ensure accuracy; however, inadvertent computerized transcription errors may be present.   Any transcriptional errors that result from this process are unintentional.     Jesse Le, M.D., F.A.C.S. Gastrointestinal and Minimally Invasive Surgery [facility] [address], [place],  [address] [phone] Main / Paging   05/15/2017

## 2017-05-16 ENCOUNTER — Inpatient Hospital Stay (HOSPITAL_COMMUNITY): Payer: Medicare Other

## 2017-05-16 ENCOUNTER — Other Ambulatory Visit: Payer: Self-pay | Admitting: *Deleted

## 2017-05-16 ENCOUNTER — Encounter: Payer: Self-pay | Admitting: Hematology

## 2017-05-16 DIAGNOSIS — C187 Malignant neoplasm of sigmoid colon: Secondary | ICD-10-CM

## 2017-05-16 LAB — BASIC METABOLIC PANEL
ANION GAP: 6 (ref 5–15)
BUN: 11 mg/dL (ref 6–20)
CO2: 22 mmol/L (ref 22–32)
Calcium: 8.1 mg/dL — ABNORMAL LOW (ref 8.9–10.3)
Chloride: 108 mmol/L (ref 101–111)
Creatinine, Ser: 1.27 mg/dL — ABNORMAL HIGH (ref 0.61–1.24)
GFR, EST NON AFRICAN AMERICAN: 55 mL/min — AB (ref >60)
Glucose, Bld: 101 mg/dL — ABNORMAL HIGH (ref 65–99)
Potassium: 4.3 mmol/L (ref 3.5–5.1)
SODIUM: 136 mmol/L (ref 135–145)

## 2017-05-16 LAB — GLUCOSE, CAPILLARY
GLUCOSE-CAPILLARY: 93 mg/dL (ref 65–99)
GLUCOSE-CAPILLARY: 70 mg/dL (ref 65–99)
Glucose-Capillary: 88 mg/dL (ref 65–99)
Glucose-Capillary: 87 mg/dL (ref 65–99)
Glucose-Capillary: 66 mg/dL (ref 65–99)

## 2017-05-16 LAB — MAGNESIUM: MAGNESIUM: 1.8 mg/dL (ref 1.7–2.4)

## 2017-05-16 LAB — CBC
HCT: 33.7 % — ABNORMAL LOW (ref 39.0–52.0)
HEMOGLOBIN: 10.2 g/dL — AB (ref 13.0–17.0)
MCH: 24.2 pg — AB (ref 26.0–34.0)
MCHC: 30.3 g/dL (ref 30.0–36.0)
MCV: 79.9 fL (ref 78.0–100.0)
PLATELETS: 166 10*3/uL (ref 150–400)
RBC: 4.22 MIL/uL (ref 4.22–5.81)
RDW: 25.5 % — ABNORMAL HIGH (ref 11.5–15.5)
WBC: 11.6 10*3/uL — AB (ref 4.0–10.5)

## 2017-05-16 MED ORDER — ACETAMINOPHEN 500 MG PO TABS
1000.0000 mg | ORAL_TABLET | Freq: Three times a day (TID) | ORAL | Status: DC
  Administered 2017-05-16 – 2017-05-18 (×6): 1000 mg via ORAL
  Filled 2017-05-16 (×7): qty 2

## 2017-05-16 NOTE — Consult Note (Addendum)
[facility] Nurse ostomy follow up Surgical team following for assessment and plan of care for abd wound.  Stoma type/location:  Ileostomy surgery performed on 5/1 Stoma red and viable, 1 inch, above skin level.  There is a red rubber rod in place underneath the stoma which will make it difficult to maintain a seal until it is removed. Agar daughter and patient participated in the teaching session, and she states she will be helping perform pouch changes after discharge. Peristomal assessment: Intact skin surrounding Treatment options for stomal/peristomal skin:  Barrier ring applied to assist with maintaining a seal while the rod is in place Output: 50cc liquid brown stool  Ostomy pouching: 1pc.  Education provided:  Demonstrated sizing, cutting out the pouch, and application of barrier ring and one piece pouch.  Pt was able to open and close velcro to empty. Enrolled patient in [facility] Start Discharge program: No Reviewed pouching routines and ordering supplies with patient and family member.  They asked appropriate questions.  Recommend home health assistance upon discharge; please order if desired. [place] team will continue to follow while in the hospital for further teaching sessions. Julien Girt MSN, RN, [place], [place], [place]

## 2017-05-16 NOTE — Progress Notes (Signed)
Patient ID: Jesse Le, male   DOB: 1945-12-19, 72 y.o.   MRN: [id number]    5 Days Post-Op  Subjective: Patient doesn't feel well today.  C/o abdominal distention and can't eat breakfast.  Denies belching, nausea, or vomiting.    Objective: Vital signs in last 24 hours: Temp:  [97.4 F (36.3 C)-98.2 F (36.8 C)] 97.6 F (36.4 C) (05/06 0822) Pulse Rate:  [75-81] 75 (05/06 0822) Resp:  [22] 22 (05/05 1618) BP: (119-134)/(55-68) 119/55 (05/06 0822) SpO2:  [97 %-99 %] 99 % (05/06 0822) Last BM Date: 05/15/17(ostomy bag)  Intake/Output from previous day: 05/05 0701 - 05/06 0700 In: 1388.3 [P.O.:600; I.V.:788.3] Out: 1880 [Urine:500; Drains:30; BWGYK:5993] Intake/Output this shift: Total I/O In: -  Out: 150 [Stool:150]  PE: Abd: distended, but still relatively soft, active BS, diffusely tender, but as expected following surgery.  RLQ loop ileostomy with no output currently, but put our over 1300cc yesterday.  Stoma is pink and viable.  Midline wound with purulent drainage on bandage and some erythema around the inferior portion of his wound.    Lab Results:  Recent Labs    05/14/17 0402 05/16/17 0230  WBC 13.5* 11.6*  HGB 9.3* 10.2*  HCT 31.1* 33.7*  PLT 236 166   BMET Recent Labs    05/14/17 0402 05/16/17 0230  NA 137 136  K 3.9 4.3  CL 106 108  CO2 23 22  GLUCOSE 124* 101*  BUN 8 11  CREATININE 1.15 1.27*  CALCIUM 7.5* 8.1*   PT/INR No results for input(s): LABPROT, INR in the last 72 hours. CMP     Component Value Date/Time   NA 136 05/16/2017 0230   K 4.3 05/16/2017 0230   CL 108 05/16/2017 0230   CO2 22 05/16/2017 0230   GLUCOSE 101 (H) 05/16/2017 0230   BUN 11 05/16/2017 0230   CREATININE 1.27 (H) 05/16/2017 0230   CALCIUM 8.1 (L) 05/16/2017 0230   PROT 6.7 05/07/2017 0332   ALBUMIN 2.2 (L) 05/07/2017 0332   AST 14 (L) 05/07/2017 0332   ALT 7 (L) 05/07/2017 0332   ALKPHOS 44 05/07/2017 0332   BILITOT 1.2 05/07/2017 0332   GFRNONAA 55 (L)  05/16/2017 0230   GFRAA >60 05/16/2017 0230   Lipase     Component Value Date/Time   LIPASE 22 05/06/2017 1036       Studies/Results: No results found.  Anti-infectives: Anti-infectives (From admission, onward)   Start     Dose/Rate Route Frequency Ordered Stop   05/11/17 2200  cefoTEtan (CEFOTAN) 2 g in sodium chloride 0.9 % 100 mL IVPB     2 g 200 mL/hr over 30 Minutes Intravenous Every 12 hours 05/11/17 1839 05/11/17 2034   05/11/17 0600  cefoTEtan in Dextrose 5% (CEFOTAN) IVPB 2 g     2 g Intravenous On call to O.R. 05/10/17 1142 05/11/17 1416       Assessment/Plan Coronary artery disease. History of myocardial infarction Hypertension Status post thoracic aortic dissection and stent placement  status post bilateral iliac stent placements COPD CKD II - creatinine up to 1.27.  Will need to monitor closely given potential for high output from ileostomy. -cont fluids at 50cc/hr, but if cr continues to go up, may need to increase.  Check BMET in am NIDDM Chronic anemia- hgb10.2 Obesity Hypokalemia -resolved  Reducible umbilical hernia Rectosigmoid colon mass- S/p LAR with primary colorectal (EE) anastomosis and diverting loop ileostomy with umbilical hernia repair 05/18/99 Dr. Georgette Dover - CEAelevated at 16 -  POD#5  -once patient returned to bed, will need to DC at least the inferior staples to his wound secondary to wound infection and purulent drainage.  Will determine need for abx therapy after wound opened. -path from colonoscopy is adenocarcinoma, surgical path pending - oncology consult pending - drain with 30 cc SS - continue - on heart healthy diet, continue as he has bowel sounds, but may need to back off secondary to feelings of fullness and distention. - OOB and mobilize  GAC:GBKOR healthy diet VTE: SCDs, lovenox JG:[id number] periop     LOS: 10 days    [name] , [facility] 05/16/2017, 8:47 AM Pager: [phone]

## 2017-05-16 NOTE — Progress Notes (Signed)
Subjective:  Patient denies any chest pain or shortness of breath. Denies abdominal pain. Noted to have drainage from the lower surgical incision which was dressed up earlier this morning. Minimal soaking of the dressing noted  Objective:  Vital Signs in the last 24 hours: Temp:  [97.4 F (36.3 C)-98.2 F (36.8 C)] 97.6 F (36.4 C) (05/06 0822) Pulse Rate:  [75-81] 75 (05/06 0822) Resp:  [22] 22 (05/05 1618) BP: (119-134)/(55-68) 119/55 (05/06 0822) SpO2:  [97 %-99 %] 99 % (05/06 0822)  Intake/Output from previous day: 05/05 0701 - 05/06 0700 In: 1388.3 [P.O.:600; I.V.:788.3] Out: 1880 [Urine:500; Drains:30; LKJZP:9150] Intake/Output from this shift: Total I/O In: -  Out: 150 [Stool:150]  Physical Exam: Neck: no adenopathy, no carotid bruit, no JVD and supple, symmetrical, trachea midline Lungs: clear to auscultation bilaterally Heart: regular rate and rhythm, S1, S2 normal and Soft systolic murmur noted Abdomen: Soft mildly distended bowel sounds present illness ileostomy stoma site healing well.  Surgical dressing noted.  Extremities: extremities normal, atraumatic, no cyanosis or edema  Lab Results: Recent Labs    05/14/17 0402 05/16/17 0230  WBC 13.5* 11.6*  HGB 9.3* 10.2*  PLT 236 166   Recent Labs    05/14/17 0402 05/16/17 0230  NA 137 136  K 3.9 4.3  CL 106 108  CO2 23 22  GLUCOSE 124* 101*  BUN 8 11  CREATININE 1.15 1.27*   No results for input(s): TROPONINI in the last 72 hours.  Invalid input(s): CK, MB Hepatic Function Panel No results for input(s): PROT, ALBUMIN, AST, ALT, ALKPHOS, BILITOT, BILIDIR, IBILI in the last 72 hours. No results for input(s): CHOL in the last 72 hours. No results for input(s): PROTIME in the last 72 hours.  Imaging: Imaging results have been reviewed and No results found.  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

## 2017-05-16 NOTE — Progress Notes (Signed)
OT Cancellation Note  Patient Details Name: Jesse Le MRN: [id number] DOB: 1945/05/21   Cancelled Treatment:    Reason Eval/Treat Not Completed: Patient declined, no reason specified. Pt stating he just wanted to sleep. Educated in benefits of OOB activity. Will continue to follow.  [name] 05/16/2017, 2:49 PM  05/16/2017 [name], OTR/L Pager: [phone]

## 2017-05-16 NOTE — Progress Notes (Signed)
Nutrition Follow-up  DOCUMENTATION CODES:   Not applicable  INTERVENTION:    D/C 48 hr calorie count  Advance diet as medically appropriate >> rec Clear Liquids >> Full Liquids >> Soft diet  NUTRITION DIAGNOSIS:   Increased nutrient needs related to (cancer) as evidenced by estimated needs, ongoing  GOAL:   Patient will meet greater than or equal to 90% of their needs, currently unmet  MONITOR:   PO intake, Supplement acceptance, Diet advancement, Weight trends, Labs, Skin, I & O's  ASSESSMENT:   72 year old male here with iron deficiency anemia with recent rectal bleeding found to have abnormal CT scan of the abdomen and pelvis suggestive of a sigmoid cancer.  He has a history of peripheral vascular disease, aortic aneurysm with endovascular repair, CAD, hypertension, hyperlipidemia, COPD, CKD.  Pt s/p procedures 5/1: LOW ANTERIOR RESECTION  PRIMARY COLORECTAL ANASTOMOSIS DIVERTING LOOP ILEOSTOMY UMBILICAL HERNIA REPAIR   5/4 >> 48 hr calorie count ordered  Pt sleeping soundly at time of visit. Unable to arouse. Spoke with RN. Was on Clear Liquids then advanced to Heart Healthy. Pt developed nausea, vomiting and abdominal pain. Now NPO.  PO intake variable at 0-100% per flowsheet records. Labs and medications reviewed. CBG's [id number].  Diet Order:   Diet Order           Diet NPO time specified  Diet effective now         EDUCATION NEEDS:   Not appropriate for education at this time >> handouts provided 5/4  Skin:  Skin Assessment: Skin Integrity Issues: Skin Integrity Issues:: Incisions Incisions: closed abdomen  Last BM:  5/5 - ileostomy    Intake/Output Summary (Last 24 hours) at 05/16/2017 1416 Last data filed at 05/16/2017 1300 Gross per 24 hour  Intake 1514.17 ml  Output 1650 ml  Net -135.83 ml   Height:   Ht Readings from Last 1 Encounters:  05/11/17 6' 0.99" (1.854 m)   Weight:   Wt Readings from Last 1 Encounters:  05/11/17 208 lb 5.4  oz (94.5 kg)   Ideal Body Weight:  83.6 kg  BMI:  Body mass index is 27.49 kg/m.  Estimated Nutritional Needs:   Kcal:  2100-2400  Protein:  105-120 grams  Fluid:  2.1 - 2.4 L/day  Arthur Holms, RD, LDN Pager #: [phone] After-Hours Pager #: [phone]

## 2017-05-16 NOTE — Progress Notes (Signed)
Abdominal dressing removed, site with purulent serosangunous drainge. Site cleansed with warm soap and water followed by diluted hydrogen peroxide. ABD placed. JP drain cleansed in same fashion, and drain dressing placed.   Patient ambulated in room 10 feet and placed in chair. 1 hour later, patient placed back in bed. Abdominal dressing with new shadowing on lower part of dressing. MD to come to bedside around 10 to assess wound and removed staples.   English as a second language teacher

## 2017-05-16 NOTE — Progress Notes (Signed)
PT Cancellation Note  Patient Details Name: Jesse Le MRN: [id number] DOB: March 17, 1945   Cancelled Treatment:    Reason Eval/Treat Not Completed: Patient declined, no reason specified   Declining mobility/OOB at this time;   Noted he had been OOB with RN earlier today;   Will follow up later today as time allows;  Otherwise, will follow up for PT at a later date;   Thank you,  Roney Marion, PT  Acute Rehabilitation Services Pager [phone] Office [place] 05/16/2017, 12:36 PM

## 2017-05-17 LAB — BASIC METABOLIC PANEL
Anion gap: 7 (ref 5–15)
BUN: 11 mg/dL (ref 6–20)
CALCIUM: 8.4 mg/dL — AB (ref 8.9–10.3)
CO2: 20 mmol/L — ABNORMAL LOW (ref 22–32)
Chloride: 113 mmol/L — ABNORMAL HIGH (ref 101–111)
Creatinine, Ser: 1.31 mg/dL — ABNORMAL HIGH (ref 0.61–1.24)
GFR calc Af Amer: >60 mL/min (ref >60)
GFR, EST NON AFRICAN AMERICAN: 53 mL/min — AB (ref >60)
GLUCOSE: 53 mg/dL — AB (ref 65–99)
Potassium: 4.3 mmol/L (ref 3.5–5.1)
Sodium: 140 mmol/L (ref 135–145)

## 2017-05-17 LAB — GLUCOSE, CAPILLARY
GLUCOSE-CAPILLARY: 88 mg/dL (ref 65–99)
GLUCOSE-CAPILLARY: 36 mg/dL — AB (ref 65–99)
GLUCOSE-CAPILLARY: 58 mg/dL — AB (ref 65–99)
GLUCOSE-CAPILLARY: 65 mg/dL (ref 65–99)
GLUCOSE-CAPILLARY: 29 mg/dL — AB (ref 65–99)
GLUCOSE-CAPILLARY: 87 mg/dL (ref 65–99)
Glucose-Capillary: 43 mg/dL — CL (ref 65–99)
Glucose-Capillary: 41 mg/dL — CL (ref 65–99)
Glucose-Capillary: 46 mg/dL — ABNORMAL LOW (ref 65–99)
Glucose-Capillary: 96 mg/dL (ref 65–99)
Glucose-Capillary: 48 mg/dL — ABNORMAL LOW (ref 65–99)
Glucose-Capillary: 97 mg/dL (ref 65–99)
Glucose-Capillary: 56 mg/dL — ABNORMAL LOW (ref 65–99)
Glucose-Capillary: 51 mg/dL — ABNORMAL LOW (ref 65–99)
Glucose-Capillary: 61 mg/dL — ABNORMAL LOW (ref 65–99)

## 2017-05-17 LAB — CBC
HCT: 34.1 % — ABNORMAL LOW (ref 39.0–52.0)
Hemoglobin: 9.9 g/dL — ABNORMAL LOW (ref 13.0–17.0)
MCH: 23.9 pg — AB (ref 26.0–34.0)
MCHC: 29 g/dL — ABNORMAL LOW (ref 30.0–36.0)
MCV: 82.2 fL (ref 78.0–100.0)
Platelets: 244 10*3/uL (ref 150–400)
RBC: 4.15 MIL/uL — AB (ref 4.22–5.81)
RDW: 25.4 % — ABNORMAL HIGH (ref 11.5–15.5)
WBC: 12.3 10*3/uL — ABNORMAL HIGH (ref 4.0–10.5)

## 2017-05-17 MED ORDER — DEXTROSE 50 % IV SOLN
25.0000 mL | Freq: Once | INTRAVENOUS | Status: AC
  Administered 2017-05-18 (×2): 25 mL via INTRAVENOUS
  Filled 2017-05-17 (×2): qty 50

## 2017-05-17 MED ORDER — DEXTROSE 10 % IV SOLN
INTRAVENOUS | Status: AC
  Administered 2017-05-17 – 2017-05-19 (×4): via INTRAVENOUS

## 2017-05-17 MED ORDER — DEXTROSE 50 % IV SOLN
INTRAVENOUS | Status: AC
  Administered 2017-05-17 (×2): 25 mL
  Filled 2017-05-17: qty 50

## 2017-05-17 MED ORDER — LOPERAMIDE HCL 2 MG PO CAPS
2.0000 mg | ORAL_CAPSULE | Freq: Four times a day (QID) | ORAL | Status: DC
  Administered 2017-05-17 (×4): 2 mg via ORAL
  Filled 2017-05-17 (×4): qty 1

## 2017-05-17 MED ORDER — DEXTROSE 50 % IV SOLN
INTRAVENOUS | Status: AC
  Administered 2017-05-17: 25 mL via INTRAVENOUS
  Filled 2017-05-17: qty 50

## 2017-05-17 MED ORDER — DEXTROSE 50 % IV SOLN
INTRAVENOUS | Status: AC
  Administered 2017-05-17 – 2017-05-18 (×2)
  Filled 2017-05-17: qty 50

## 2017-05-17 MED ORDER — DEXTROSE 50 % IV SOLN
50.0000 mL | Freq: Once | INTRAVENOUS | Status: AC
  Administered 2017-05-17: 50 mL via INTRAVENOUS

## 2017-05-17 MED ORDER — PIPERACILLIN-TAZOBACTAM 3.375 G IVPB
3.3750 g | Freq: Three times a day (TID) | INTRAVENOUS | Status: DC
  Administered 2017-05-17 – 2017-05-18 (×5): 3.375 g via INTRAVENOUS
  Filled 2017-05-17 (×8): qty 50

## 2017-05-17 MED ORDER — DEXTROSE 50 % IV SOLN
25.0000 mL | Freq: Once | INTRAVENOUS | Status: AC
  Administered 2017-05-17 (×2): 25 mL via INTRAVENOUS

## 2017-05-17 MED ORDER — BOOST / RESOURCE BREEZE PO LIQD CUSTOM
1.0000 | Freq: Three times a day (TID) | ORAL | Status: DC
  Administered 2017-05-17 – 2017-05-18 (×4): 1 via ORAL

## 2017-05-17 MED ORDER — LOPERAMIDE HCL 2 MG PO CAPS: 2.0000 mg | ORAL_CAPSULE | Freq: Three times a day (TID) | ORAL | Status: DC

## 2017-05-17 MED ORDER — DEXTROSE 50 % IV SOLN
25.0000 mL | Freq: Once | INTRAVENOUS | Status: AC
  Administered 2017-05-17: 25 mL via INTRAVENOUS

## 2017-05-17 NOTE — Consult Note (Addendum)
[facility] Nurse ostomy follow up Surgical team following for assessment and plan of care for abd wound. Pt had pouch leak into his wound early this am when they assessed the patient related to greater than 3 liters of liquid stool yesterday.  Stoma type/location:  Ileostomy surgery performed on 5/1 Stoma red and viable, 1 inch, above skin level.  There is a red rubber rod in place underneath the stoma which will make it difficult to maintain a seal until it is removed; please write orders to remove as soon as possible.  Peristomal assessment: Intact skin surrounding Treatment options for stomal/peristomal skin:  Barrier ring applied to assist with maintaining a seal while the rod is in place Output: large amt liquid brown stool  Ostomy pouching: 2pc.  Education provided:  Demonstrated sizing, cutting out the pouch, and application of barrier ring and one piece pouch.  Placed in high output pouch attached to bedside drainage bag. Supplies at bedside for staff nurse use, educational materials left in the room.  No family present. Pt will not be able to change or empty pouch without assistance;   Recommend home health assistance upon discharge; please order if desired. Enrolled patient in [place] Start Discharge program: Yes [place] team will continue to follow while in the hospital for further teaching sessions. Julien Girt MSN, RN, [place], [place], [place]

## 2017-05-17 NOTE — Progress Notes (Signed)
Wife updated with his condition and changes to his care by myself.  All questions answered.  Patient needs an oncology consult.  [name] 2:53 PM 05/17/2017

## 2017-05-17 NOTE — Progress Notes (Signed)
Occupational Therapy Evaluation Patient Details Name: Jesse Le MRN: [id number] DOB: [birth year] Today's Date: 05/17/2017    History of Present Illness Pt is a 72 y.o. 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 PMH includes HTN, DM, COPD, aortic aneurysm, CKD II, obesity.    Clinical Impression   PTA, pt independent with ADL and mobility. Pt states he had frequent liquid stools over the last 3-6 months and he did not leave his house very often because he was afraid he would have an accident in public. Girlfriend states that for the past 3-6 months, pt gets easily frustratedand has more difficulty with his memory. Pt currently requires mod A with ADL tasks and min A with sit - stand transfers. Pt declined ambulation stating "NOT", "I'm not walking". Pt began to talk about how he would "do it when he was ready and people couldn't push him". Provided active listening. Encourage pt to mobilize with staff at [facility] level.  At this time, feel pt would benefit from SNF for rehab to facilitate safe DC home. Will follow acutely to maximize functional level of independence with ADL and functional mobility for ADL.     Follow Up Recommendations  SNF;Supervision/Assistance - 24 hour    Equipment Recommendations  3 in 1 bedside commode;Tub/shower bench    Recommendations for Other Services       Precautions / Restrictions Precautions Precautions: Fall Precaution Comments: ileostomy bag, abdominal wound drainage; JP drain Restrictions Weight Bearing Restrictions: No      Mobility Bed Mobility       General bed mobility comments: OOB in chair  Transfers Overall transfer level: Needs assistance Equipment used: Rolling walker (2 wheeled) Transfers: Sit to/from Stand Sit to Stand: Min assist             Balance Overall balance assessment: Needs assistance    Sitting balance-[name]: Fair       Standing balance-[name]: Poor Standing balance comment: Reliant on UE support; easily fatigued standing for pericare                            ADL either performed or assessed with clinical judgement   ADL Overall ADL's : Needs assistance/impaired     Grooming: Set up;Sitting   Upper Body Bathing: Minimal assistance;Sitting   Lower Body Bathing: Moderate assistance;Sit to/from stand   Upper Body Dressing : Minimal assistance;Sitting   Lower Body Dressing: Moderate assistance;Sit to/from stand   Toilet Transfer: Minimal Scientist, forensic Details (indicate cue type and reason): simulated Toileting- Clothing Manipulation and Hygiene: Moderate assistance       Functional mobility during ADLs: Minimal assistance;Rolling walker(lmited participation) General ADL Comments: unable to reach feet; ileostomy bag crossed body after getting up to chair. Educated pt on need to be aware of his tubes/drainage bag and set up; Began education on managemetn of ileostomy drainage pouch and need for him to be aware that spout if draining properly to heal prevent leakage around barrier.      Vision Patient Visual Report: Blurring of vision Vision Assessment?: Vision impaired- to be further tested in functional context Additional Comments: Pt reports his vision is not good and that he needs glasses; states he has not gone to the eye doctor     Perception     Praxis      Pertinent Vitals/Pain Pain Assessment: Faces Faces  Pain Scale: Hurts little more Pain Location: Abdomen Pain Descriptors / Indicators: Sore Pain Intervention(s): Limited activity within patient's tolerance     Hand Dominance Right   Extremity/Trunk Assessment Upper Extremity Assessment Upper Extremity Assessment: Generalized weakness(peripheral neuropathy) RUE Coordination: decreased fine motor LUE Coordination: decreased fine motor    Lower Extremity Assessment Lower Extremity Assessment: Defer to PT evaluation   Cervical / Trunk Assessment Cervical / Trunk Assessment: Normal   Communication Communication Communication: No difficulties   Cognition Arousal/Alertness: Awake/alert Behavior During Therapy: Flat affect;Agitated(agitaetd at times) Overall Cognitive Status: History of cognitive impairments - at baseline Area of Impairment: Attention;Following commands;Safety/judgement;Awareness;Problem solving                   Current Attention Level: Sustained   Following Commands: Follows one step commands with increased time;Follows multi-step commands inconsistently Safety/Judgement: Decreased awareness of deficits;Decreased awareness of safety Awareness: Emergent Problem Solving: Slow processing;Requires verbal cues General Comments: girlfriend present during the evaluation and states that pt is hard headed and does "what he wants to do when he wants to do it"; states he has demonstrated behavioral changes adn more difficulty with memory over the last 3-6 months   General Comments  At end of session pt stated "we will do this together"    Exercises  recommend pt work on ringing out a dry bath towel to work on B Retail banker. Pt agreed   Shoulder Instructions      Home Living Family/patient expects to be discharged to:: Private residence Living Arrangements: Spouse/significant other(Girlfriend) Available Help at Discharge: Family;Available PRN/intermittently Type of Home: Apartment Home Access: Level entry     Home Layout: One level     Bathroom Shower/Tub: Teacher, early years/pre: Standard Bathroom Accessibility: Yes How Accessible: Accessible via walker Home Equipment: Cane - single point          Prior Functioning/Environment Level of Independence: Needs assistance  Gait / Transfers Assistance Needed: inidependent without AD ADL's / Homemaking Assistance Needed:  independent   Comments: Pt staes he had frequent accidents wtih loose stool and he stopped going outside of the home due to the fear of having accidents in public        OT Problem List: Decreased strength;Decreased activity tolerance;Impaired balance (sitting and/or standing);Decreased safety awareness;Decreased knowledge of use of DME or AE;Obesity;Pain      OT Treatment/Interventions: Self-care/ADL training;Therapeutic exercise;Energy conservation;DME and/or AE instruction;Therapeutic activities;Patient/family education;Balance training    OT Goals(Current goals can be found in the care plan section) Acute Rehab OT Goals Patient Stated Goal: to do it at his own pace OT Goal Formulation: With patient Time For Goal Achievement: 05/31/17 Potential to Achieve Goals: Good  OT Frequency: Min 2X/week   Barriers to D/C:            Co-evaluation              AM-PAC PT "6 Clicks" Daily Activity     Outcome Measure Help from another person eating meals?: None Help from another person taking care of personal grooming?: A Little Help from another person toileting, which includes using toliet, bedpan, or urinal?: A Lot Help from another person bathing (including washing, rinsing, drying)?: A Lot Help from another person to put on and taking off regular upper body clothing?: A Little Help from another person to put on and taking off regular lower body clothing?: A Lot 6 Click Score: 16   End of Session Equipment Utilized During Treatment: Gait belt;Rolling  walker Nurse Communication: Mobility status;Other (comment)(leaking abdominal bandage)  Activity Tolerance: Other (comment)(limited by patinet participation) Patient left: in chair;with call bell/phone within reach;with family/visitor present  OT Visit Diagnosis: Other abnormalities of gait and mobility (R26.89);Muscle weakness (generalized) (M62.81);Pain Pain - part of body: (abdomen)                Time: 3716-9678 OT Time  Calculation (min): 30 min Charges:  OT General Charges $OT Visit: 1 Visit OT Evaluation $OT Eval Moderate Complexity: 1 Mod OT Treatments $Self Care/Home Management : 8-22 mins G-Codes:     [facility], OT/L  [phone] 05/17/2017  Vaeda Westall,HILLARY 05/17/2017, 3:08 PM

## 2017-05-17 NOTE — Progress Notes (Addendum)
Patient ID: Jesse Le, male   DOB: 1945/03/24, 72 y.o.   MRN: [id number]    6 Days Post-Op  Subjective: Pt doesn't speak much but is oriented times 3.  No further emesis since yesterday.  C/o abdominal soreness, but no major pain.  Objective: Vital signs in last 24 hours: Temp:  [97.5 F (36.4 C)-98 F (36.7 C)] 98 F (36.7 C) (05/06 2352) Pulse Rate:  [71-83] 83 (05/06 2352) Resp:  [20] 20 (05/06 1651) BP: (119-132)/(55-71) 127/64 (05/06 2352) SpO2:  [97 %-99 %] 98 % (05/06 2352) Last BM Date: 05/16/17  Intake/Output from previous day: 05/06 0701 - 05/07 0700 In: 1099.2 [P.O.:50; I.V.:1049.2] Out: 3970 [Urine:600; Drains:70; YNWGN:5621] Intake/Output this shift: No intake/output data recorded.  PE: Abd: RLQ ileostomy is leaking with contamination in his wound.  Bag is completely full of bilious enteric fluid.  3.3L of output yesterday.  Stoma is pink and viable.  Midline wound with some bile staining on wound secondary to leakage into the wound.  Otherwise improving from yesterday.  Remaining staples intact with no further infection noted.  LLQ JP drain with thin feculent appearing output.  Brown and foul smelling. 70cc documented yesterday.  Lab Results:  Recent Labs    05/16/17 0230  WBC 11.6*  HGB 10.2*  HCT 33.7*  PLT 166   BMET Recent Labs    05/16/17 0230 05/17/17 0359  NA 136 140  K 4.3 4.3  CL 108 113*  CO2 22 20*  GLUCOSE 101* 53*  BUN 11 11  CREATININE 1.27* 1.31*  CALCIUM 8.1* 8.4*   PT/INR No results for input(s): LABPROT, INR in the last 72 hours. CMP     Component Value Date/Time   NA 140 05/17/2017 0359   K 4.3 05/17/2017 0359   CL 113 (H) 05/17/2017 0359   CO2 20 (L) 05/17/2017 0359   GLUCOSE 53 (L) 05/17/2017 0359   BUN 11 05/17/2017 0359   CREATININE 1.31 (H) 05/17/2017 0359   CALCIUM 8.4 (L) 05/17/2017 0359   PROT 6.7 05/07/2017 0332   ALBUMIN 2.2 (L) 05/07/2017 0332   AST 14 (L) 05/07/2017 0332   ALT 7 (L) 05/07/2017 0332   ALKPHOS 44 05/07/2017 0332   BILITOT 1.2 05/07/2017 0332   GFRNONAA 53 (L) 05/17/2017 0359   GFRAA >60 05/17/2017 0359   Lipase     Component Value Date/Time   LIPASE 22 05/06/2017 1036       Studies/Results: Dg Abd 1 View  Result Date: 05/16/2017 CLINICAL DATA:  Ileus, nausea and vomiting, mid abdominal pain. EXAM: ABDOMEN - 1 VIEW COMPARISON:  Lumbar spine series of September 19, 2012 and CT scan of the abdomen and pelvis of November 22, 2011 FINDINGS: There is an aorto bi-iliac stent graft in place. There is an internal iliac graft on the left as well. The bowel gas pattern is normal. A surgical drainage tube is present on the left with the tip terminating over the mid to upper portion of the pelvis. The stool burden within the rectum is normal. IMPRESSION: There is exclusion of the flanks. The bowel gas pattern however does not suggest significant obstruction. A surgical drainage tube is present within the pelvis. There is a short segment of tubular structure overlying the lateral aspect of the right iliac bone that is of uncertain significance. Electronically Signed   By: David  [name] M.D.   On: 05/16/2017 13:24    Anti-infectives: Anti-infectives (From admission, onward)   Start     Dose/Rate  Route Frequency Ordered Stop   05/11/17 2200  cefoTEtan (CEFOTAN) 2 g in sodium chloride 0.9 % 100 mL IVPB     2 g 200 mL/hr over 30 Minutes Intravenous Every 12 hours 05/11/17 1839 05/11/17 2034   05/11/17 0600  cefoTEtan in Dextrose 5% (CEFOTAN) IVPB 2 g     2 g Intravenous On call to O.R. 05/10/17 1142 05/11/17 1416       Assessment/Plan Coronary artery disease. History of myocardial infarction Hypertension Status post thoracic aortic dissection and stent placement  status post bilateral iliac stent placements COPD CKD II - creatinine up to 1.31 from 1.27.  had 3.3L of ostomy output yesterday.  Increase fluids to 75cc/hr.  BMET in am. -UOP only 600cc yesterday.  May eventually  need further increase in IVF rate... NIDDM Chronic anemia- hgb10.2 Obesity Hypokalemia -resolved,   Reducible umbilical hernia Rectosigmoid colon mass- S/p LAR with primary colorectal (EE) anastomosis and diverting loop ileostomy with umbilical hernia repair 4/0/97 Dr. Georgette Dover - Pathology shows adenoCA, 0/15 LNs - POD#6 -Wound infection opened up on POD 5, now with NS WD dressing changes BID or prn saturation from ileostomy leakage.  Wound has cleaned up with dressing changes. -today with tan, foul smelling, feculent appearing output from JP drain near EEA, concern for anastomotic leak.  Will start on Zosyn and check a CBC today.  I will have pharmacy dose zosyn as he has a history of CKD and cr slightly elevated. -if WBC up significantly, may need need a CT scan; however he is AF and not having much pain currently and drain seems to be controlling drainage. -will allow clear liquids today, despite JP drain findings as he has bowel sounds and is diverted. -having high ileostomy output with over 3L yesterday.  Already on fiber.  On prn imodium.  Will increase this to 2mg  scheduled QID today.  If this makes little difference will increase to max dose of 16mg  daily.  If this still does not help then he may need sandostatin or something else added, but will hold off on that for now.   FEN:CLD/ IVFs increase to 75cc/hr to keep up with ostomy output VTE: SCDs, lovenox DZ:HGDJM 5/7 -->    LOS: 11 days    [name] , [facility] Surgery 05/17/2017, 8:21 AM Pager: [phone]

## 2017-05-17 NOTE — Progress Notes (Signed)
Physical Therapy Treatment Patient Details Name: Jesse Le MRN: [id number] DOB: 08/05/1945 Today's Date: 05/17/2017    History of Present Illness Pt is a 72 y.o. 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 PMH includes HTN, DM, COPD, aortic aneurysm, CKD II, obesity.    PT Comments    Pt again only agreeable to mobilize to chair this session. Can stand with RW and min guard for balance, requiring modA to prevent posterior LOB taking steps to chair; demonstrates flat affect with decreased awareness and insight into deficits. Pt with increased drainage from ileostomy site (RN notified). Pt with decreased activity tolerance, balance, and generalized weakness. Continue to recommend SNF-level therapies; pt and girlfriend in agreement with this. Will follow acutely.   Follow Up Recommendations  SNF;Supervision/Assistance - 24 hour     Equipment Recommendations  (TBD)    Recommendations for Other Services       Precautions / Restrictions Precautions Precautions: Fall Precaution Comments: ileostomy bag, abdominal drain Restrictions Weight Bearing Restrictions: No    Mobility  Bed Mobility Overal bed mobility: Needs Assistance Bed Mobility: Rolling;Sidelying to Sit Rolling: Supervision Sidelying to sit: Supervision       General bed mobility comments: Increased time and effort; supervision for safety/assist with lines  Transfers Overall transfer level: Needs assistance Equipment used: Rolling walker (2 wheeled) Transfers: Sit to/from Stand Sit to Stand: Min assist;Min guard         General transfer comment: Initial minA to stand from bed with RW; increased time and effort with repeated cues for correct hand placement. Min guard to stand from recliner; heavy reliance on BUE support. Poor eccentric control into  sitting  Ambulation/Gait Ambulation/Gait assistance: Mod assist Ambulation Distance (Feet): 4 Feet Assistive device: Rolling walker (2 wheeled) Gait Pattern/deviations: Step-to pattern;Leaning posteriorly;Trunk flexed Gait velocity: Decreased Gait velocity interpretation: <1.31 ft/sec, indicative of household ambulator General Gait Details: ModA to prevent posterior LOB while taking steps from bed to recliner with RW; decreased ability to correct despite max multimodal cues   Stairs             Wheelchair Mobility    Modified Rankin (Stroke Patients Only)       Balance Overall balance assessment: Needs assistance   Sitting balance-[name]: Good       Standing balance-[name]: Poor Standing balance comment: Reliant on UE support; easily fatigued standing for pericare                             Cognition Arousal/Alertness: Awake/alert Behavior During Therapy: Flat affect Overall Cognitive Status: Impaired/Different from baseline Area of Impairment: Attention;Following commands;Safety/judgement;Awareness;Problem solving                   Current Attention Level: Sustained   Following Commands: Follows one step commands with increased time;Follows multi-step commands inconsistently Safety/Judgement: Decreased awareness of deficits;Decreased awareness of safety Awareness: Emergent Problem Solving: Slow processing;Requires verbal cues        Exercises      General Comments General comments (skin integrity, edema, etc.): Girlfriend present throughout session. Increased drainage with saturate bed pads (RN notified)      Pertinent Vitals/Pain Pain Assessment: Faces Faces Pain Scale: Hurts a little bit Pain Location: Abdomen Pain Descriptors / Indicators: Sore Pain Intervention(s): Monitored during session;Limited activity within patient's tolerance    Home Living  Prior Function            PT  Goals (current goals can now be found in the care plan section) Acute Rehab PT Goals Patient Stated Goal: Rest PT Goal Formulation: With patient Time For Goal Achievement: 05/27/17 Potential to Achieve Goals: Fair Progress towards PT goals: Progressing toward goals    Frequency    Min 2X/week      PT Plan Current plan remains appropriate    Co-evaluation              AM-PAC PT "6 Clicks" Daily Activity  Outcome Measure  Difficulty turning over in bed (including adjusting bedclothes, sheets and blankets)?: A Little Difficulty moving from lying on back to sitting on the side of the bed? : A Little Difficulty sitting down on and standing up from a chair with arms (e.g., wheelchair, bedside commode, etc,.)?: Unable Help needed moving to and from a bed to chair (including a wheelchair)?: A Lot Help needed walking in hospital room?: A Lot Help needed climbing 3-5 steps with a railing? : A Lot 6 Click Score: 13    End of Session Equipment Utilized During Treatment: Gait belt Activity Tolerance: Patient limited by fatigue Patient left: in chair;with call bell/phone within reach;with chair alarm set;with family/visitor present Nurse Communication: Mobility status PT Visit Diagnosis: Other abnormalities of gait and mobility (R26.89)     Time: 5038-8828 PT Time Calculation (min) (ACUTE ONLY): 25 min  Charges:  $Therapeutic Activity: 23-37 mins                    G Codes:      Jesse Le, PT, DPT Acute Rehab Services  Pager: [place] 05/17/2017, 2:32 PM

## 2017-05-17 NOTE — Progress Notes (Signed)
Dr. Terrence Dupont paged about continued hypoglycemia.   ! Amp of D50 ordered. And will continue oral carb replacement.   English as a second language teacher

## 2017-05-17 NOTE — Progress Notes (Signed)
Hypoglycemic Event  CBG: 29 @ 2037  Treatment: 25 mL D50 given  Symptoms: Lethargy  Follow-up CBG: Time: 2058 CBG Result: 48  Additional 25 mL of D50 given @ 2100   Second recheck @ 2115, CBG 97  Possible Reasons for Event: Pt not eating well  Comments/MD notified: MD Carnicelli, D10 infusion ordered    Jesse Le  Jesse Le

## 2017-05-17 NOTE — Progress Notes (Addendum)
Nutrition Follow Up/Brief Note  RD briefly visited with pt today.  He is resting and watching TV.  Reports no nausea, vomiting or abdominal pain at this time.  Clear Liquid breakfast on tray table.  Pt consumed some jello, broth and grape juice.  Discussed addition of clear liquid nutrition supplement (ie Boost Breeze).  Pt amenable.  RD to order and continue to follow.  Arthur Holms, RD, LDN Pager #: [phone] After-Hours Pager #: [phone]

## 2017-05-17 NOTE — Progress Notes (Signed)
Inpatient Diabetes Program Recommendations  AACE/ADA: New Consensus Statement on Inpatient Glycemic Control (2015)  Target Ranges:  Prepandial:   less than 140 mg/dL      Peak postprandial:   less than 180 mg/dL (1-2 hours)      Critically ill patients:  140 - 180 mg/dL   Lab Results  Component Value Date   GLUCAP 88 05/17/2017   HGBA1C 4.9 05/06/2017    Review of Glycemic Control Results for Jesse Le, Jesse Le (MRN [id number]) as of 05/17/2017 10:51  Ref. Range 05/16/2017 11:38 05/16/2017 16:06 05/16/2017 21:59 05/17/2017 07:43 05/17/2017 08:20  Glucose-Capillary Latest Ref Range: 65 - 99 mg/dL 87 66 70 43 (LL) 88   Diabetes history: DM2 Outpatient Diabetes medications: Metformin 500 mg bid Current orders for Inpatient glycemic control: Amaryl 2 mg qd + Novolog sensitive tid  Inpatient Diabetes Program Recommendations:   Noted hypoglycemic this am of 43 and treated with dextrose. Amaryl 2 mg given. Patient has not been taking Amaryl @ home prior to admission. Text page to Dr. Terrence Dupont with recommendation to discontinue.  Thank you, Nani Gasser. Samaiya Awadallah, RN, MSN, CDE  Diabetes Coordinator Inpatient Glycemic Control Team Team Pager [phone] (8am-5pm) 05/17/2017 10:53 AM

## 2017-05-17 NOTE — Progress Notes (Signed)
Subjective:  Patient denies any chest pain or shortness of breath. Denies abdominal pain. Denies fever or chills. Blood pressure is soft this morning  Objective:  Vital Signs in the last 24 hours: Temp:  [97.5 F (36.4 C)-98 F (36.7 C)] 97.9 F (36.6 C) (05/07 0902) Pulse Rate:  [63-83] 64 (05/07 0922) Resp:  [20-22] 22 (05/07 0902) BP: (96-132)/(50-71) 96/56 (05/07 0922) SpO2:  [97 %-98 %] 98 % (05/06 2352)  Intake/Output from previous day: 05/06 0701 - 05/07 0700 In: 1099.2 [P.O.:50; I.V.:1049.2] Out: 3970 [Urine:600; Drains:70; Stool:3300] Intake/Output from this shift: No intake/output data recorded.  Physical Exam: Neck: no adenopathy, no carotid bruit, no JVD and supple, symmetrical, trachea midline Lungs: clear to auscultation bilaterally Heart: regular rate and rhythm, S1, S2 normal and Soft systolic murmur noted Abdomen: Mildly distended and soft, nontender, sounds present ileostomy site pink surgical dressing and JP catheter with serosanguineous fluid noted Extremities: extremities normal, atraumatic, no cyanosis or edema  Lab Results: Recent Labs    05/16/17 0230 05/17/17 0855  WBC 11.6* 12.3*  HGB 10.2* 9.9*  PLT 166 244   Recent Labs    05/16/17 0230 05/17/17 0359  NA 136 140  K 4.3 4.3  CL 108 113*  CO2 22 20*  GLUCOSE 101* 53*  BUN 11 11  CREATININE 1.27* 1.31*   No results for input(s): TROPONINI in the last 72 hours.  Invalid input(s): CK, MB Hepatic Function Panel No results for input(s): PROT, ALBUMIN, AST, ALT, ALKPHOS, BILITOT, BILIDIR, IBILI in the last 72 hours. No results for input(s): CHOL in the last 72 hours. No results for input(s): PROTIME in the last 72 hours.  Imaging: Imaging results have been reviewed and Dg Abd 1 View  Result Date: 05/16/2017 CLINICAL DATA:  Ileus, nausea and vomiting, mid abdominal pain. EXAM: ABDOMEN - 1 VIEW COMPARISON:  Lumbar spine series of September 19, 2012 and CT scan of the abdomen and pelvis of  November 22, 2011 FINDINGS: There is an aorto bi-iliac stent graft in place. There is an internal iliac graft on the left as well. The bowel gas pattern is normal. A surgical drainage tube is present on the left with the tip terminating over the mid to upper portion of the pelvis. The stool burden within the rectum is normal. IMPRESSION: There is exclusion of the flanks. The bowel gas pattern however does not suggest significant obstruction. A surgical drainage tube is present within the pelvis. There is a short segment of tubular structure overlying the lateral aspect of the right iliac bone that is of uncertain significance. Electronically Signed   By: David  [name] M.D.   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

## 2017-05-17 NOTE — Progress Notes (Signed)
Hypoglycemic Event  CBG: 56  Treatment: 8 oz of orange juice  Symptoms: Lethargy  Follow-up CBG: Time: 2255 CBG Result: 51  2300- 25 mL of D50 given  2324- CBG recheck: 87  Possible Reasons for Event: Oral hypoglycemics given last at 0930  Comments/MD notified: MD Carnicelli    [name]  Rudine Rieger

## 2017-05-18 ENCOUNTER — Inpatient Hospital Stay (HOSPITAL_COMMUNITY): Payer: Medicare Other

## 2017-05-18 DIAGNOSIS — K912 Postsurgical malabsorption, not elsewhere classified: Secondary | ICD-10-CM

## 2017-05-18 DIAGNOSIS — R6521 Severe sepsis with septic shock: Secondary | ICD-10-CM

## 2017-05-18 DIAGNOSIS — Z933 Colostomy status: Secondary | ICD-10-CM

## 2017-05-18 DIAGNOSIS — A419 Sepsis, unspecified organism: Secondary | ICD-10-CM

## 2017-05-18 LAB — BASIC METABOLIC PANEL
ANION GAP: 7 (ref 5–15)
ANION GAP: 8 (ref 5–15)
Anion gap: 3 — ABNORMAL LOW (ref 5–15)
BUN: 14 mg/dL (ref 6–20)
BUN: 13 mg/dL (ref 6–20)
BUN: 13 mg/dL (ref 6–20)
CALCIUM: 8.4 mg/dL — AB (ref 8.9–10.3)
CHLORIDE: 117 mmol/L — AB (ref 101–111)
CHLORIDE: 113 mmol/L — AB (ref 101–111)
CO2: 17 mmol/L — ABNORMAL LOW (ref 22–32)
CO2: 15 mmol/L — AB (ref 22–32)
CO2: 14 mmol/L — ABNORMAL LOW (ref 22–32)
CREATININE: 1.83 mg/dL — AB (ref 0.61–1.24)
Calcium: 8.3 mg/dL — ABNORMAL LOW (ref 8.9–10.3)
Calcium: 7.7 mg/dL — ABNORMAL LOW (ref 8.9–10.3)
Chloride: 108 mmol/L (ref 101–111)
Creatinine, Ser: 1.8 mg/dL — ABNORMAL HIGH (ref 0.61–1.24)
Creatinine, Ser: 1.79 mg/dL — ABNORMAL HIGH (ref 0.61–1.24)
GFR calc Af Amer: 41 mL/min — ABNORMAL LOW (ref >60)
GFR calc Af Amer: 42 mL/min — ABNORMAL LOW (ref >60)
GFR calc non Af Amer: 35 mL/min — ABNORMAL LOW (ref >60)
GFR calc non Af Amer: 36 mL/min — ABNORMAL LOW (ref >60)
GFR, EST AFRICAN AMERICAN: 42 mL/min — AB (ref >60)
GFR, EST NON AFRICAN AMERICAN: 36 mL/min — AB (ref >60)
GLUCOSE: 75 mg/dL (ref 65–99)
Glucose, Bld: 90 mg/dL (ref 65–99)
Glucose, Bld: 54 mg/dL — ABNORMAL LOW (ref 65–99)
POTASSIUM: 4.2 mmol/L (ref 3.5–5.1)
POTASSIUM: 4.1 mmol/L (ref 3.5–5.1)
Potassium: 4.2 mmol/L (ref 3.5–5.1)
SODIUM: 137 mmol/L (ref 135–145)
SODIUM: 130 mmol/L — AB (ref 135–145)
Sodium: 135 mmol/L (ref 135–145)

## 2017-05-18 LAB — CBC
HCT: 34.5 % — ABNORMAL LOW (ref 39.0–52.0)
HEMATOCRIT: 34.9 % — AB (ref 39.0–52.0)
HEMATOCRIT: 32.7 % — AB (ref 39.0–52.0)
HEMOGLOBIN: 9.7 g/dL — AB (ref 13.0–17.0)
Hemoglobin: 9.9 g/dL — ABNORMAL LOW (ref 13.0–17.0)
Hemoglobin: 10 g/dL — ABNORMAL LOW (ref 13.0–17.0)
MCH: 23.6 pg — ABNORMAL LOW (ref 26.0–34.0)
MCH: 23.6 pg — AB (ref 26.0–34.0)
MCH: 24 pg — ABNORMAL LOW (ref 26.0–34.0)
MCHC: 28.7 g/dL — ABNORMAL LOW (ref 30.0–36.0)
MCHC: 28.7 g/dL — ABNORMAL LOW (ref 30.0–36.0)
MCHC: 29.7 g/dL — ABNORMAL LOW (ref 30.0–36.0)
MCV: 82.3 fL (ref 78.0–100.0)
MCV: 82.3 fL (ref 78.0–100.0)
MCV: 80.9 fL (ref 78.0–100.0)
PLATELETS: 245 10*3/uL (ref 150–400)
Platelets: 236 10*3/uL (ref 150–400)
Platelets: 199 10*3/uL (ref 150–400)
RBC: 4.19 MIL/uL — ABNORMAL LOW (ref 4.22–5.81)
RBC: 4.24 MIL/uL (ref 4.22–5.81)
RBC: 4.04 MIL/uL — AB (ref 4.22–5.81)
RDW: 25.4 % — ABNORMAL HIGH (ref 11.5–15.5)
RDW: 25.5 % — ABNORMAL HIGH (ref 11.5–15.5)
RDW: 25.2 % — AB (ref 11.5–15.5)
WBC: 14.9 10*3/uL — AB (ref 4.0–10.5)
WBC: 15.7 10*3/uL — AB (ref 4.0–10.5)
WBC: 14.1 10*3/uL — AB (ref 4.0–10.5)

## 2017-05-18 LAB — BLOOD GAS, ARTERIAL
Acid-base deficit: 14.2 mmol/L — ABNORMAL HIGH (ref 0.0–2.0)
Bicarbonate: 11.1 mmol/L — ABNORMAL LOW (ref 20.0–28.0)
DRAWN BY: 518061
FIO2: 21
O2 Saturation: 96 %
PCO2 ART: 24.1 mmHg — AB (ref 32.0–48.0)
PH ART: 7.287 — AB (ref 7.350–7.450)
Patient temperature: 98.6
pO2, Arterial: 80.7 mmHg — ABNORMAL LOW (ref 83.0–108.0)

## 2017-05-18 LAB — GLUCOSE, CAPILLARY
GLUCOSE-CAPILLARY: 63 mg/dL — AB (ref 65–99)
GLUCOSE-CAPILLARY: 57 mg/dL — AB (ref 65–99)
GLUCOSE-CAPILLARY: 34 mg/dL — AB (ref 65–99)
GLUCOSE-CAPILLARY: 128 mg/dL — AB (ref 65–99)
GLUCOSE-CAPILLARY: 112 mg/dL — AB (ref 65–99)
GLUCOSE-CAPILLARY: 55 mg/dL — AB (ref 65–99)
Glucose-Capillary: 43 mg/dL — CL (ref 65–99)
Glucose-Capillary: 49 mg/dL — ABNORMAL LOW (ref 65–99)
Glucose-Capillary: 50 mg/dL — ABNORMAL LOW (ref 65–99)
Glucose-Capillary: 52 mg/dL — ABNORMAL LOW (ref 65–99)
Glucose-Capillary: 53 mg/dL — ABNORMAL LOW (ref 65–99)
Glucose-Capillary: 60 mg/dL — ABNORMAL LOW (ref 65–99)
Glucose-Capillary: 63 mg/dL — ABNORMAL LOW (ref 65–99)
Glucose-Capillary: 64 mg/dL — ABNORMAL LOW (ref 65–99)
Glucose-Capillary: 74 mg/dL (ref 65–99)
Glucose-Capillary: 76 mg/dL (ref 65–99)
Glucose-Capillary: 77 mg/dL (ref 65–99)
Glucose-Capillary: 79 mg/dL (ref 65–99)
Glucose-Capillary: 83 mg/dL (ref 65–99)
Glucose-Capillary: 85 mg/dL (ref 65–99)
Glucose-Capillary: 87 mg/dL (ref 65–99)
Glucose-Capillary: 97 mg/dL (ref 65–99)
Glucose-Capillary: 99 mg/dL (ref 65–99)

## 2017-05-18 LAB — LACTIC ACID, PLASMA: Lactic Acid, Venous: 3.4 mmol/L (ref 0.5–1.9)

## 2017-05-18 MED ORDER — SODIUM CHLORIDE 0.9 % IV BOLUS
500.0000 mL | Freq: Once | INTRAVENOUS | Status: AC
  Administered 2017-05-18: 500 mL via INTRAVENOUS

## 2017-05-18 MED ORDER — SODIUM CHLORIDE 0.9 % IV SOLN: 250.0000 mL | INTRAVENOUS | Status: DC | PRN

## 2017-05-18 MED ORDER — VANCOMYCIN HCL 10 G IV SOLR
1500.0000 mg | INTRAVENOUS | Status: DC
  Administered 2017-05-19: 1500 mg via INTRAVENOUS
  Filled 2017-05-18 (×2): qty 1500

## 2017-05-18 MED ORDER — VANCOMYCIN HCL 10 G IV SOLR
1750.0000 mg | Freq: Once | INTRAVENOUS | Status: AC
  Administered 2017-05-19: 1750 mg via INTRAVENOUS
  Filled 2017-05-18: qty 1750

## 2017-05-18 MED ORDER — HYDROCORTISONE NA SUCCINATE PF 100 MG IJ SOLR
50.0000 mg | Freq: Four times a day (QID) | INTRAMUSCULAR | Status: DC
  Administered 2017-05-19 (×2): 50 mg via INTRAVENOUS
  Filled 2017-05-18 (×2): qty 2

## 2017-05-18 MED ORDER — DEXTROSE 50 % IV SOLN
2.0000 | Freq: Once | INTRAVENOUS | Status: AC
  Administered 2017-05-18: 100 mL via INTRAVENOUS
  Filled 2017-05-18: qty 100

## 2017-05-18 MED ORDER — DEXTROSE 50 % IV SOLN
INTRAVENOUS | Status: AC
  Administered 2017-05-18: 50 mL
  Filled 2017-05-18: qty 50

## 2017-05-18 MED ORDER — DIPHENOXYLATE-ATROPINE 2.5-0.025 MG PO TABS
2.0000 | ORAL_TABLET | Freq: Four times a day (QID) | ORAL | Status: DC
  Filled 2017-05-18: qty 2

## 2017-05-18 MED ORDER — DEXTROSE 50 % IV SOLN
INTRAVENOUS | Status: AC
  Filled 2017-05-18: qty 50

## 2017-05-18 MED ORDER — SODIUM BICARBONATE 8.4 % IV SOLN
50.0000 meq | Freq: Once | INTRAVENOUS | Status: AC
  Administered 2017-05-18: 50 meq via INTRAVENOUS
  Filled 2017-05-18: qty 50

## 2017-05-18 MED ORDER — DEXTROSE 50 % IV SOLN
INTRAVENOUS | Status: AC
  Administered 2017-05-18: 13:00:00
  Filled 2017-05-18: qty 50

## 2017-05-18 MED ORDER — LOPERAMIDE HCL 2 MG PO CAPS
4.0000 mg | ORAL_CAPSULE | Freq: Four times a day (QID) | ORAL | Status: DC
  Administered 2017-05-18 – 2017-05-19 (×4): 4 mg via ORAL
  Filled 2017-05-18 (×4): qty 2

## 2017-05-18 MED ORDER — DEXTROSE 50 % IV SOLN
25.0000 mL | INTRAVENOUS | Status: DC | PRN
  Administered 2017-05-18: 50 mL via INTRAVENOUS
  Administered 2017-05-18 – 2017-05-19 (×2): 25 mL via INTRAVENOUS
  Administered 2017-05-20 (×2): 50 mL via INTRAVENOUS
  Administered 2017-05-20: 25 mL via INTRAVENOUS
  Administered 2017-05-20: 50 mL via INTRAVENOUS
  Administered 2017-05-21 – 2017-05-22 (×5): 25 mL via INTRAVENOUS
  Filled 2017-05-18 (×9): qty 50

## 2017-05-18 MED ORDER — SODIUM CHLORIDE 0.9 % IV BOLUS
2000.0000 mL | Freq: Once | INTRAVENOUS | Status: AC
  Administered 2017-05-18: 2000 mL via INTRAVENOUS

## 2017-05-18 MED ORDER — SODIUM CHLORIDE 0.9 % IV BOLUS: 2000.0000 mL | Freq: Once | INTRAVENOUS | Status: DC

## 2017-05-18 MED ORDER — DEXTROSE 50 % IV SOLN
1.0000 | Freq: Once | INTRAVENOUS | Status: AC
  Administered 2017-05-22: 25 mL via INTRAVENOUS
  Filled 2017-05-18: qty 50

## 2017-05-18 MED ORDER — DEXTROSE 50 % IV SOLN
INTRAVENOUS | Status: AC
  Administered 2017-05-18 (×2): 25 mL
  Filled 2017-05-18: qty 50

## 2017-05-18 MED ORDER — DEXTROSE 50 % IV SOLN
1.0000 | Freq: Once | INTRAVENOUS | Status: AC
  Administered 2017-05-18: 50 mL via INTRAVENOUS

## 2017-05-18 MED ORDER — IOPAMIDOL (ISOVUE-300) INJECTION 61%
INTRAVENOUS | Status: AC
  Administered 2017-05-19: 01:00:00
  Filled 2017-05-18: qty 30

## 2017-05-18 MED ORDER — SODIUM BICARBONATE 8.4 % IV SOLN
INTRAVENOUS | Status: DC
  Administered 2017-05-18 – 2017-05-20 (×4): via INTRAVENOUS
  Filled 2017-05-18 (×7): qty 850

## 2017-05-18 MED ORDER — NOREPINEPHRINE BITARTRATE 1 MG/ML IV SOLN
2.0000 ug/min | INTRAVENOUS | Status: DC
  Filled 2017-05-18: qty 4

## 2017-05-18 NOTE — Progress Notes (Signed)
CBG: 43  Treatment: 4oz orange juice  CBG: 50  Treatment: MD notified. Received new orders for 2 ampules D50. 8 oz orange juice  CBG: 128

## 2017-05-18 NOTE — Progress Notes (Signed)
Hypoglycemic Event  CBG: 64  Treatment: 25 mL D50 @ 0045  Symptoms: Lethargy  Follow-up CBG: Time: 0100 CBG Result:74    [name]  Darsh Vandevoort

## 2017-05-18 NOTE — Progress Notes (Addendum)
RN called regarding ongoing episodes of hypoglycemia associated with some lethargy. RN states pt was more interactive over the weekend than he is now. Pt is sitting upright in bed with eyes closed, easily to awaken, alert and oriented x4. pt states he is "trying to rest but they just keep coming in here and sticking me every 5 minutes." Pt denies being hungry or having a healthy appetite at home. Skin warm and dry, fingers cool to touch, unable to obtain a good pleath from o2 sensor placed O2 probe on ear, sats 98-100% RA.  Dr. Terrence Dupont paged, new orders for CBC, CMET and ABG   1650 follow up with RN Aleksey, CBG 43. She is following protocol orders at this time. Pt remains alert and oriented x4. Advised RN to page Dr. Terrence Dupont and CCS to possibly include hospitalist or IM for control of blood sugars.

## 2017-05-18 NOTE — Progress Notes (Signed)
Patient's QTC was 562 on the monitor. MD notified. Received no new orders.

## 2017-05-18 NOTE — Progress Notes (Signed)
Looked up medication lomotil on Micromedex. Text said contraindication to this medication is dehydration. Patient lost much fluid within past 24 hours.

## 2017-05-18 NOTE — Progress Notes (Signed)
Subjective:  Patient denies any chest pain or shortness of breath.  Denies abdominal pain.  Patient had an episode of hypotension and large volume of drainage through his ileostomy and noted to have acute renal failure.  This a.m. Also had multiple episodes of hypoglycemia requiring D50  Objective:  Vital Signs in the last 24 hours: Temp:  [97.5 F (36.4 C)-97.9 F (36.6 C)] 97.5 F (36.4 C) (05/08 0127) Pulse Rate:  [64-83] 83 (05/08 0127) Resp:  [18] 18 (05/07 1607) BP: (89-96)/(52-57) 89/52 (05/08 0127) SpO2:  [97 %-100 %] 100 % (05/08 0127)  Intake/Output from previous day: 05/07 0701 - 05/08 0700 In: 2457.5 [P.O.:1000; I.V.:1307.5; IV Piggyback:150] Out: 2694 [Urine:900; Drains:105; WNIOE:7035] Intake/Output from this shift: No intake/output data recorded.  Physical Exam: Neck: no adenopathy, no carotid bruit, no JVD and supple, symmetrical, trachea midline Lungs: clear to auscultation bilaterally Heart: regular rate and rhythm, S1, S2 normal and soft systolic murmur noted Abdomen: exam unchanged Extremities: extremities normal, atraumatic, no cyanosis or edema  Lab Results: Recent Labs    05/17/17 0855 05/18/17 0223  WBC 12.3* 14.9*  HGB 9.9* 9.9*  PLT 244 245   Recent Labs    05/17/17 0359 05/18/17 0223  NA 140 137  K 4.3 4.2  CL 113* 117*  CO2 20* 17*  GLUCOSE 53* 90  BUN 11 14  CREATININE 1.31* 1.83*   No results for input(s): TROPONINI in the last 72 hours.  Invalid input(s): CK, MB Hepatic Function Panel No results for input(s): PROT, ALBUMIN, AST, ALT, ALKPHOS, BILITOT, BILIDIR, IBILI in the last 72 hours. No results for input(s): CHOL in the last 72 hours. No results for input(s): PROTIME in the last 72 hours.  Imaging: Imaging results have been reviewed and Dg Abd 1 View  Result Date: 05/16/2017 CLINICAL DATA:  Ileus, nausea and vomiting, mid abdominal pain. EXAM: ABDOMEN - 1 VIEW COMPARISON:  Lumbar spine series of September 19, 2012 and CT  scan of the abdomen and pelvis of November 22, 2011 FINDINGS: There is an aorto bi-iliac stent graft in place. There is an internal iliac graft on the left as well. The bowel gas pattern is normal. A surgical drainage tube is present on the left with the tip terminating over the mid to upper portion of the pelvis. The stool burden within the rectum is normal. IMPRESSION: There is exclusion of the flanks. The bowel gas pattern however does not suggest significant obstruction. A surgical drainage tube is present within the pelvis. There is a short segment of tubular structure overlying the lateral aspect of the right iliac bone that is of uncertain significance. Electronically Signed   By: David  [name] M.D.   On: 05/16/2017 13:24    Cardiac Studies:  Assessment/Plan:  Rectosigmoid adenocarcinoma status post resection with primary colorectal anastomosis and dilating loop ileostomy and umbilical hernia repair Inferior surgical wound infection Status post hypertensive shock. Acute on chronic renal injury secondary to hypotension. 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 Strict I and O's Hold   Blood pressure medicines Patient is off glimepiride Monitor blood sugar closely   LOS: 12 days    Charolette Forward 05/18/2017, 9:09 AM

## 2017-05-18 NOTE — Progress Notes (Signed)
Pharmacy Antibiotic Note  Jesse [name] is a 72 y.o. male admitted on 05/06/2017 with abdominal pain associated with poor appetite and weight loss.  Pharmacy has been consulted for vancomycin dosing for wound infection; also on Zosyn. He is s/p LAR with primary colorectalanastomosis and diverting loop ileostomy with umbilical hernia repair on 05/11/17. AKI noted, SCr trending up and currently 1.8.   Plan: Vancomycin 1750 mg IV once then 1500 mg IV q24h, goal trough 10-15 mcg/ml Monitor renal function and clinical progress, adjust dose as appropriate  Trough as clinically indicated  Height: 6' 0.99" (185.4 cm) Weight: 208 lb 5.4 oz (94.5 kg) IBW/kg (Calculated) : 79.88  Temp (24hrs), Avg:97.6 F (36.4 C), Min:97.5 F (36.4 C), Max:97.8 F (36.6 C)  Recent Labs  Lab 05/14/17 0402 05/16/17 0230 05/17/17 0359 05/17/17 0855 05/18/17 0223 05/18/17 1348  WBC 13.5* 11.6*  --  12.3* 14.9* 15.7*  CREATININE 1.15 1.27* 1.31*  --  1.83* 1.80*    Estimated Creatinine Clearance: 41.9 mL/min (A) (by C-G formula based on SCr of 1.8 mg/dL (H)).    No Known Allergies  Antimicrobials this admission: Zosyn 5/7 >>  Vanc 5/8 >>   Dose adjustments this admission:   Microbiology results:   Thank you for allowing pharmacy to be a part of this patient's care.  Renold Genta, PharmD, BCPS Clinical Pharmacist Clinical phone for 05/18/2017 until 10p is [phone] After 10p, please call Main Rx at [phone] for assistance 05/18/2017 9:16 PM

## 2017-05-18 NOTE — Procedures (Signed)
Central Venous Catheter Insertion Procedure Note Jesse [name] [id number] [birth year]  Procedure: Insertion of Central Venous Catheter Indications: Assessment of intravascular volume, Drug and/or fluid administration and Frequent blood sampling  Procedure Details Consent: Risks of procedure as well as the alternatives and risks of each were explained to the (patient/caregiver).  Consent for procedure obtained. Time Out: Verified patient identification, verified procedure, site/side was marked, verified correct patient position, special equipment/implants available, medications/allergies/relevent history reviewed, required imaging and test results available.  Performed  Maximum sterile technique was used including antiseptics, cap, gloves, gown, hand hygiene, mask and sheet. Skin prep: Chlorhexidine; local anesthetic administered A antimicrobial bonded/coated triple lumen catheter was placed in the left internal jugular vein using the Seldinger technique. Ultrasound guidance used.Yes.   Catheter placed to 20 cm. Blood aspirated via all 3 ports and then flushed x 3. Line sutured x 2 and dressing applied.  Evaluation Blood flow good Complications: No apparent complications Patient did tolerate procedure well. Chest X-ray ordered to verify placement.  CXR: pending.  Georgann Housekeeper, AGACNP-BC [facility] Pulmonology/Critical Care Pager [phone] or [phone]  05/18/2017 11:22 PM

## 2017-05-18 NOTE — Progress Notes (Signed)
Patient ID: Jesse [name], male   DOB: [birth year], 72 y.o.   MRN: [id number]    7 Days Post-Op  Subjective: Pt more lethargic today, but still oriented x3.  His BP is low this morning and he is still having issues with hypoglycemia.  She has been give multiple amps of D50 as well as being placed on a D10 IVF.  He tolerated his clear liquids well yesterday he says.  Objective: Vital signs in last 24 hours: Temp:  [97.5 F (36.4 C)-97.9 F (36.6 C)] 97.5 F (36.4 C) (05/08 0127) Pulse Rate:  [63-83] 83 (05/08 0127) Resp:  [18-22] 18 (05/07 1607) BP: (89-99)/(50-57) 89/52 (05/08 0127) SpO2:  [97 %-100 %] 100 % (05/08 0127) Last BM Date: 05/18/17  Intake/Output from previous day: 05/07 0701 - 05/08 0700 In: 2457.5 [P.O.:1000; I.V.:1307.5; IV Piggyback:150] Out: 4098 [Urine:900; Drains:105; JXBJY:7829] Intake/Output this shift: No intake/output data recorded.  PE: Gen: lethargic, but arouses and answers questions, just slowly Heart: regular Lungs: CTAB Abd: soft, appropriately tender, ostomy with 4.5L of output yesterday.  Midline wound with necrotic tissue at the base.  Fascial sutures remain in place.  No evidence of dehiscence.  LLQ drain with frankly feculent output today. 105cc of output recorded  Lab Results:  Recent Labs    05/17/17 0855 05/18/17 0223  WBC 12.3* 14.9*  HGB 9.9* 9.9*  HCT 34.1* 34.5*  PLT 244 245   BMET Recent Labs    05/17/17 0359 05/18/17 0223  NA 140 137  K 4.3 4.2  CL 113* 117*  CO2 20* 17*  GLUCOSE 53* 90  BUN 11 14  CREATININE 1.31* 1.83*  CALCIUM 8.4* 8.4*   PT/INR No results for input(s): LABPROT, INR in the last 72 hours. CMP     Component Value Date/Time   NA 137 05/18/2017 0223   K 4.2 05/18/2017 0223   CL 117 (H) 05/18/2017 0223   CO2 17 (L) 05/18/2017 0223   GLUCOSE 90 05/18/2017 0223   BUN 14 05/18/2017 0223   CREATININE 1.83 (H) 05/18/2017 0223   CALCIUM 8.4 (L) 05/18/2017 0223   PROT 6.7 05/07/2017 0332   ALBUMIN 2.2  (L) 05/07/2017 0332   AST 14 (L) 05/07/2017 0332   ALT 7 (L) 05/07/2017 0332   ALKPHOS 44 05/07/2017 0332   BILITOT 1.2 05/07/2017 0332   GFRNONAA 35 (L) 05/18/2017 0223   GFRAA 41 (L) 05/18/2017 0223   Lipase     Component Value Date/Time   LIPASE 22 05/06/2017 1036       Studies/Results: Dg Abd 1 View  Result Date: 05/16/2017 CLINICAL DATA:  Ileus, nausea and vomiting, mid abdominal pain. EXAM: ABDOMEN - 1 VIEW COMPARISON:  Lumbar spine series of September 19, 2012 and CT scan of the abdomen and pelvis of November 22, 2011 FINDINGS: There is an aorto bi-iliac stent graft in place. There is an internal iliac graft on the left as well. The bowel gas pattern is normal. A surgical drainage tube is present on the left with the tip terminating over the mid to upper portion of the pelvis. The stool burden within the rectum is normal. IMPRESSION: There is exclusion of the flanks. The bowel gas pattern however does not suggest significant obstruction. A surgical drainage tube is present within the pelvis. There is a short segment of tubular structure overlying the lateral aspect of the right iliac bone that is of uncertain significance. Electronically Signed   By: David  [name] M.D.   On: 05/16/2017 13:24  Anti-infectives: Anti-infectives (From admission, onward)   Start     Dose/Rate Route Frequency Ordered Stop   05/17/17 0930  piperacillin-tazobactam (ZOSYN) IVPB 3.375 g     3.375 g 12.5 mL/hr over 240 Minutes Intravenous Every 8 hours 05/17/17 0836     05/11/17 2200  cefoTEtan (CEFOTAN) 2 g in sodium chloride 0.9 % 100 mL IVPB     2 g 200 mL/hr over 30 Minutes Intravenous Every 12 hours 05/11/17 1839 05/11/17 2034   05/11/17 0600  cefoTEtan in Dextrose 5% (CEFOTAN) IVPB 2 g     2 g Intravenous On call to O.R. 05/10/17 1142 05/11/17 1416       Assessment/Plan Coronary artery disease. History of myocardial infarction Hypertension Status post thoracic aortic dissection and stent  placement  status post bilateral iliac stent placements COPD AKI/CKD II- creatinine up to 1.83 from 1.31. patient is under resuscitated due to high ostomy output.  Increase fluids to 125cc/hr.  He is getting a 500cc bolus now and will likely need more fluid after this. -UOP up to 900cc yesterday NIDDM - having issues with hypoglycemia currently.  On D10 fluid and getting amps of D50 per medicine. Chronic anemia- hgb9.9, stable Obesity Hypokalemia -resolved Hypotension - patient is hypotensive this am.  This is likely secondary to fluid dehydration from high ostomy output.  Replace fluids, but given concern for leak will have to monitor closely for needs to a CT scan.  He is also lethargic secondary to his hypoglycemia.  I have written for him to be transferred to a SDU type status so he can be more closely monitored.  He may need a hospitalist consult to assist in managing his ongoing issues.    Reducible umbilical hernia Rectosigmoid colon mass- S/p LAR with primary colorectal (EE)anastomosis and diverting loop ileostomy with umbilical hernia repair 05/19/83 Dr. 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  Base of wound is necrotic, but fascia is still intact.  -with suspected anastomotic leak secondary to feculent output in JP drain.  on zosyn.  I will have pharmacy dose zosyn as he has a history of CKD and cr slightly elevated.  -if WBC up significantly, may need need a CT scan; however he is AF and not having much pain currently and drain seems to be controlling drainage. -advance to fulls.  -having high ileostomy output with over 4.5L yesterday.  Already on fiber.  Increase scheduled imodium to max dose as well as add lomotil at max dose.  Getting control of his high output is key to his fluid status.  We need to try and replace fluids cc per cc to keep his  euvolemic and to help his creatinine.  -needs an oncology consult at some point   OYD:XAJO liquids/IVFs (125cc/hr)/ NS bolus, will likely need repeating VTE: SCDs, lovenox IN:OMVEH 5/7 -->  Dispo - tx to SDU type status and follow closely secondary to hypotension and persistent hypoglycemia   LOS: 12 days    [name] , [facility] Surgery 05/18/2017, 8:11 AM Pager: [phone]

## 2017-05-18 NOTE — Plan of Care (Signed)
  Problem: Clinical Measurements: Goal: Ability to maintain clinical measurements within normal limits will improve Outcome: Not Progressing   Pt's CBGs have been low at every check since 0800, continuous fluids with 10% dextrose and multiple administrations of D50 have not stabilized the pt's blood sugar.

## 2017-05-18 NOTE — Progress Notes (Signed)
Hypoglycemic Event  CBG: 57 @ 0330  Treatment: 25 mL D50 @ 0337  Symptoms: Lethargy  Follow-up CBG: Time: 0400 CBG Result:76  Comments/MD notified:MD Carnicelli    Jesse  [name] [name]

## 2017-05-18 NOTE — Progress Notes (Signed)
Hypoglycemic Event  CBG: 63 @ 0200  Treatment: 25 mL D50 @ 0215  Symptoms: Lethargy  Follow-up CBG: Time: 0230 CBG Result: 97  Jesse  [name]

## 2017-05-18 NOTE — Progress Notes (Signed)
POD7 Sigmiod colectomy and diverting ileostomy and hernia repair, aki secondary to high ostomy output, hypotension, hypoglycemia, more lethargic, was given multiple amps of d50 and d10 ivf's, he has suspected leak secondary to feculent output in jp drain, conts on ivf's, ivf bolus, iv abx, plan for snf.

## 2017-05-18 NOTE — Progress Notes (Signed)
CSW continuing to follow for possible SNF placement when stable  Jesse [name], [place] Social Worker [phone]

## 2017-05-18 NOTE — Progress Notes (Signed)
Attempted to page surgery regarding patient condition twice since 1630. Still have not received call back.

## 2017-05-18 NOTE — Progress Notes (Signed)
Patient had BP of 99/51. Received orders to give IV fluid boluses. BP raised to 127/50. Patient had not urine output this morning. Bladder scan showed 15 mL in bladder. MD notified. Will continue to monitor.

## 2017-05-18 NOTE — Progress Notes (Signed)
CBG: 34  Treatment: D50, 4oz cranberry juice with 1 packet of sugar, MD notified  Follow-up CBG: 77

## 2017-05-18 NOTE — Progress Notes (Signed)
2030: Pt BP low, 74/51, 78/61 via the monitor and 88/42 manually. Pt CBGs still trending down during the day. Rapid response was consulted and Dr. Terrence Dupont was called. Dr.Harwani is placing orders for the pt to be transferred to ICU for levophed administration.   General surgery was notified of pt transfer orders.   2130: PCCM/Dr. Gilford Raid was consulted and came up to the unit to assess the pt. A 2,000 mL bolus and an amp of D50 were both ordered via verbal order with feed back. The pt's orientation remained variable.  2220: The pt's BP came up to 149'P systolic after the bolus administration.   2230: Pt was transferred to [place].

## 2017-05-18 NOTE — Progress Notes (Signed)
Asked to evaluate patient by Dr. Terrence Dupont.  Clinical decline c/w sepsis.    Pt became more hypotensive on stepdown with drop in UOP and feculent drain output.    Pt more stable after transfer, but lost IV access.  PCCM team present to place central line.   Upon evaluation, pt reports feeling "horrible."  He couldn't really say how specifically, just horrible.  He denied nausea.  He complained of increase in abdominal pain.    Pt afebrile, hypotensive, HR WNL Alert, conversant, but sleepy. Abd quite distended.  Soft, diffuse mild tenderness.  Dressing taken down.  No evidence of dehiscence.  JP in LLQ frankly feculent and foul smelling.   Plan  Sepsis On broad spectrum antibiotics. Consider adding antifungal.  Pt with feculent output, but stool stream diverted with ileostomy.  Still likely some residual stool burden in colon.  Risk for anastamotic leak.    Will CT abdomen to evaluate for uncontrolled leak/fistula.    Appreciate Dr. Cecille Aver management of patient.    Pt still with high output ileostomy output that will play a role in resuscitation.  Thankfully it is less than yesterday on 4 mg imodium QID.

## 2017-05-18 NOTE — Consult Note (Signed)
PULMONARY / CRITICAL CARE MEDICINE   Name: Jesse [name] MRN: [id number] DOB: [birth year]    ADMISSION DATE:  05/06/2017 CONSULTATION DATE:  05/18/2017  REFERRING MD:  Dr. Terrence Dupont  CHIEF COMPLAINT:  Hypotension  HISTORY OF PRESENT ILLNESS:   72 year old male with PMH as below, which is significant for CAD, MD, and thoracic aortic aneurysm. He presented to [facility] ED 4/26 with complaints of generalized abdominal pain with poor appetite and weight loss. Intermittent hematochezia as well. He had a CT done demonstrating 9x5cm soft tissue opacification concerning for colonic carcinoma. He was admitted to Dr. Zenia Resides service. He underwent colonoscopy on 4/28, which discovered a fungating partially obstructing tumor in the recto-sigmoid colon. Biopsy done and came back with adenocarcinoma on 5/1 and he was taken later that day for anterior resection with primary colorectal anastomosis and diverting loop ileostomy, rigid proctoscopy, umbilical hernia repair. Oncology saw him 5/2, and got ball rolling for outpatient follow up. Initially post-op he was doing well. Diet was advancing, mobility improving. 5/7 he developed some borderline hypotension and ongoing hypoglycemia. These were treated with supportive measures. 5/8 condition worsened to include altered mental status and refractory hypotension. He had been having some feculent drainage in his JP as well and was started on ABX. Course also complicated by high ileostomy output.  PCCM asked to see.    PAST MEDICAL HISTORY :  He  has a past medical history of Aneurysm of aorta ([place]), Coronary artery disease, Diabetes mellitus without complication ([place]), Hypertension, and Myocardial infarct ([place]).  PAST SURGICAL HISTORY: He  has a past surgical history that includes Video bronchoscopy (10/14/2011); intravascular ultrasound (10/14/2011); Abdominal aortic aneurysm repair (10-14-11); Colonoscopy with propofol (N/A, 05/08/2017); Colon resection sigmoid (N/A,  05/11/2017); Umbilical hernia repair (N/A, 05/11/2017); and Ileostomy (N/A, 05/11/2017).  No Known Allergies  No current facility-administered medications on file prior to encounter.    Current Outpatient Medications on File Prior to Encounter  Medication Sig  . aspirin EC 81 MG tablet Take 81 mg by mouth daily.  . metFORMIN (GLUCOPHAGE) 500 MG tablet Take 1 tablet (500 mg total) by mouth 2 (two) times daily with a meal.  . metoprolol tartrate (LOPRESSOR) 50 MG tablet Take 50 mg by mouth 2 (two) times daily.  Marland Kitchen glimepiride (AMARYL) 2 MG tablet Take 1 tablet (2 mg total) by mouth daily with breakfast. (Patient not taking: Reported on 05/06/2017)    FAMILY HISTORY:  His has no family status information on file.    SOCIAL HISTORY: He  reports that he quit smoking about 5 years ago. His smoking use included cigarettes. He smoked 0.50 packs per day. He has never used smokeless tobacco. He reports that he drinks about 0.6 oz of alcohol per week. He reports that he does not use drugs.  REVIEW OF SYSTEMS:   Unable as patient is encephalopathic  SUBJECTIVE:    VITAL SIGNS: BP (!) 78/61 (BP Location: Right Arm)   Pulse 93   Temp 97.8 F (36.6 C) (Oral)   Resp 18   Ht 6' 0.99" (1.854 m)   Wt 94.5 kg (208 lb 5.4 oz)   SpO2 100%   BMI 27.49 kg/m   HEMODYNAMICS:    VENTILATOR SETTINGS:    INTAKE / OUTPUT: I/O last 3 completed shifts: In: 5446.7 [P.O.:1000; I.V.:3069.6; IV Piggyback:1377.2] Out: 9983 [Urine:1100; Drains:165; JASNK:5397]  PHYSICAL EXAMINATION: General:  Elderly adult male of normal body habitus in NAD Neuro:  Alert, oriented, non-focal HEENT:  [place]/AT, PERRL, no JVD  Cardiovascular:  RRR, No MRG. No peripheral edema.  Lungs:  Clear bilateral breath sounds Abdomen:  Longitudinal abdominal incision. Inferior portion is open and packed. Non-tender, non-distended Musculoskeletal:  No acute deforimty Skin:  Grossly intact  LABS:  BMET Recent Labs  Lab 05/17/17 0359  05/18/17 0223 05/18/17 1348  NA 140 137 135  K 4.3 4.2 4.2  CL 113* 117* 113*  CO2 20* 17* 15*  BUN 11 14 13   CREATININE 1.31* 1.83* 1.80*  GLUCOSE 53* 90 75    Electrolytes Recent Labs  Lab 05/16/17 0230 05/17/17 0359 05/18/17 0223 05/18/17 1348  CALCIUM 8.1* 8.4* 8.4* 8.3*  MG 1.8  --   --   --     CBC Recent Labs  Lab 05/17/17 0855 05/18/17 0223 05/18/17 1348  WBC 12.3* 14.9* 15.7*  HGB 9.9* 9.9* 10.0*  HCT 34.1* 34.5* 34.9*  PLT 244 245 236    Coag's No results for input(s): APTT, INR in the last 168 hours.  Sepsis Markers No results for input(s): LATICACIDVEN, PROCALCITON, O2SATVEN in the last 168 hours.  ABG Recent Labs  Lab 05/18/17 1258  PHART 7.287*  PCO2ART 24.1*  PO2ART 80.7*    Liver Enzymes No results for input(s): AST, ALT, ALKPHOS, BILITOT, ALBUMIN in the last 168 hours.  Cardiac Enzymes No results for input(s): TROPONINI, PROBNP in the last 168 hours.  Glucose Recent Labs  Lab 05/18/17 1629 05/18/17 1659 05/18/17 1808 05/18/17 1922 05/18/17 2010 05/18/17 2129  GLUCAP 43* 50* 128* 112* 85 55*    Imaging No results found.   STUDIES:  CT abdomen 4/26 >  9 x 5 cm eccentric soft tissue abnormality seen involving the sigmoid colon concerning for neoplasm or malignancy, although diverticulitis cannot be excluded.  CULTURES: Blood culture 5/8 >  ANTIBIOTICS: Zosyn 5/7 > 5/8 Unasyn 5/8 > Vancomycin 5/7 > Eraxis 5/8 >  SIGNIFICANT EVENTS: 4/26 admit for abdominal pain. Mass identified on CT 4/28 colonoscopy. Biopsy sent.  5/1 bx grows adeno. To OR for resection ileostomy 5/8 hypotension hypoglycemia AMS, transfer to ICU  LINES/TUBES: CVL  5/8 >  DISCUSSION: 72 year old male admitted with abdominal pain found to have colorectal mass. Biopsy grew adenocarcinoma. Resected and ileostomy placed 5/1. Course since complicated by high output ostomy, wound infection, feculent JP drainage, and now hypotension despite IVF  resuscitation.   ASSESSMENT / PLAN:  PULMONARY A: No acute issues  P:   Supportive Care  CARDIOVASCULAR A:  Hypotension  P:  Telemetry monitoring. MAP goal 65 mmHg. Prioritize volume resuscitation given very high ostomy output. Levophed if needed Cardiology following (Dr. Terrence Dupont)  RENAL A:   AKI on CKD Hyponatremia  P:   Fluids BMP  GASTROINTESTINAL A:   New ileostomy Rectosigmoid adenocarcinoma status post resection  P:   NPO Pepcid Surgery following  HEMATOLOGIC A:   Anemia Rectosigmoid adenocarcinoma status post resection some abnormalities on CT raising concern for mets  P:  Follow CBC Lovenox Oncology following  INFECTIOUS A:   Sepsis potential sources include wound, peritonitis. ? Anastomotic breakdown  P:   Repeat cultures Broad abx as above Eraxis  ENDOCRINE A:   DM P:   CBG monitoring and SSI  NEUROLOGIC A:   Acute metabolic encephalopathy  P:   Monitor closely   FAMILY  - Updates: Family updated  - Inter-disciplinary family meet or Palliative Care meeting due by:  [place], AGACNP-BC [facility] Pulmonology/Critical Care Pager [phone] or [phone]  05/18/2017 10:33 PM

## 2017-05-18 NOTE — Progress Notes (Signed)
Hypoglycemic Event  CBG: 60 @ 0500  Treatment: 25 mL D50 @ 0516  Symptoms: Lethargy  Follow-up CBG: Time: 0600 CBG Result: 63  Treatment: 25 mL D50 @ 0602  Follow-up CBG: Time: 0626   CBG Result: 83   Jesse [name]  Jesse [name]

## 2017-05-18 NOTE — Progress Notes (Signed)
CBG: 53  Treatment: 4oz cranberry juice with 1 pack sugar  CBG 49  Treatment: rapid response called, D50 ampule given  CBG: 99

## 2017-05-18 NOTE — Progress Notes (Signed)
Hypoglycemic Event  CBG: 55  Treatment: 50 mL D50  Symptoms: Lethargy  Comments/MD notified: Dr. Gilford Raid, PCCM    Jesse  [name]

## 2017-05-18 NOTE — Consult Note (Addendum)
[place] Nurse ostomy follow up Surgical team following for assessment and plan of care for abd wound.   Stoma type/location:Ileostomy surgery performed on 5/1 Stoma red and viable, 1 inch, above skin level. There is a red rubber rod in place underneath the stoma which will make it difficult to maintain a seal until it is removed; please write orders to remove as soon as possible.  Current high output pouch intact with good seal, large amt liquid green stool in bedside drainage bag. Education provided:No family present. Pt will not be able to change or empty pouch without assistance.  Recommend home health assistance upon discharge; please order if desired. Supplies at bedside for staff use PRN if leaking occurs. Enrolled patient in [place] Discharge program:Yes [place] team will continue to follow while in the hospital for further teaching sessions. Julien Girt MSN, RN, [place], [place], [place]

## 2017-05-19 ENCOUNTER — Inpatient Hospital Stay: Payer: Self-pay

## 2017-05-19 ENCOUNTER — Inpatient Hospital Stay (HOSPITAL_COMMUNITY): Payer: Medicare Other

## 2017-05-19 DIAGNOSIS — K659 Peritonitis, unspecified: Secondary | ICD-10-CM

## 2017-05-19 LAB — BLOOD GAS, ARTERIAL
Acid-base deficit: 7.6 mmol/L — ABNORMAL HIGH (ref 0.0–2.0)
Bicarbonate: 16.8 mmol/L — ABNORMAL LOW (ref 20.0–28.0)
Drawn by: 398991
FIO2: 21
O2 Saturation: 96.6 %
PATIENT TEMPERATURE: 97
PH ART: 7.371 (ref 7.350–7.450)
pCO2 arterial: 29.4 mmHg — ABNORMAL LOW (ref 32.0–48.0)
pO2, Arterial: 79.1 mmHg — ABNORMAL LOW (ref 83.0–108.0)

## 2017-05-19 LAB — GLUCOSE, CAPILLARY
GLUCOSE-CAPILLARY: 165 mg/dL — AB (ref 65–99)
GLUCOSE-CAPILLARY: 217 mg/dL — AB (ref 65–99)
GLUCOSE-CAPILLARY: 242 mg/dL — AB (ref 65–99)
GLUCOSE-CAPILLARY: 93 mg/dL (ref 65–99)
Glucose-Capillary: 228 mg/dL — ABNORMAL HIGH (ref 65–99)
Glucose-Capillary: 71 mg/dL (ref 65–99)
Glucose-Capillary: 85 mg/dL (ref 65–99)

## 2017-05-19 LAB — PROTIME-INR
INR: 1.6
PROTHROMBIN TIME: 18.9 s — AB (ref 11.4–15.2)

## 2017-05-19 LAB — CBC
HEMATOCRIT: 30.2 % — AB (ref 39.0–52.0)
HEMOGLOBIN: 9 g/dL — AB (ref 13.0–17.0)
MCH: 23.7 pg — AB (ref 26.0–34.0)
MCHC: 29.8 g/dL — AB (ref 30.0–36.0)
MCV: 79.7 fL (ref 78.0–100.0)
Platelets: 224 10*3/uL (ref 150–400)
RBC: 3.79 MIL/uL — ABNORMAL LOW (ref 4.22–5.81)
RDW: 25.1 % — ABNORMAL HIGH (ref 11.5–15.5)
WBC: 14.6 10*3/uL — ABNORMAL HIGH (ref 4.0–10.5)

## 2017-05-19 LAB — BASIC METABOLIC PANEL
ANION GAP: 5 (ref 5–15)
Anion gap: 8 (ref 5–15)
BUN: 11 mg/dL (ref 6–20)
BUN: 10 mg/dL (ref 6–20)
CALCIUM: 7.4 mg/dL — AB (ref 8.9–10.3)
CHLORIDE: 104 mmol/L (ref 101–111)
CO2: 17 mmol/L — AB (ref 22–32)
CO2: 19 mmol/L — ABNORMAL LOW (ref 22–32)
Calcium: 7.4 mg/dL — ABNORMAL LOW (ref 8.9–10.3)
Chloride: 110 mmol/L (ref 101–111)
Creatinine, Ser: 1.71 mg/dL — ABNORMAL HIGH (ref 0.61–1.24)
Creatinine, Ser: 1.52 mg/dL — ABNORMAL HIGH (ref 0.61–1.24)
GFR calc Af Amer: 44 mL/min — ABNORMAL LOW (ref >60)
GFR, EST AFRICAN AMERICAN: 51 mL/min — AB (ref >60)
GFR, EST NON AFRICAN AMERICAN: 38 mL/min — AB (ref >60)
GFR, EST NON AFRICAN AMERICAN: 44 mL/min — AB (ref >60)
GLUCOSE: 93 mg/dL (ref 65–99)
Glucose, Bld: 278 mg/dL — ABNORMAL HIGH (ref 65–99)
Potassium: 3.8 mmol/L (ref 3.5–5.1)
Potassium: 3.8 mmol/L (ref 3.5–5.1)
Sodium: 132 mmol/L — ABNORMAL LOW (ref 135–145)
Sodium: 131 mmol/L — ABNORMAL LOW (ref 135–145)

## 2017-05-19 LAB — LACTIC ACID, PLASMA: LACTIC ACID, VENOUS: 1.7 mmol/L (ref 0.5–1.9)

## 2017-05-19 LAB — MRSA PCR SCREENING: MRSA BY PCR: NEGATIVE

## 2017-05-19 MED ORDER — HYDROCORTISONE NA SUCCINATE PF 100 MG IJ SOLR
50.0000 mg | Freq: Two times a day (BID) | INTRAMUSCULAR | Status: DC
  Administered 2017-05-19 – 2017-05-21 (×4): 50 mg via INTRAVENOUS
  Filled 2017-05-19 (×4): qty 2

## 2017-05-19 MED ORDER — SODIUM CHLORIDE 0.9 % IV SOLN
200.0000 mg | Freq: Once | INTRAVENOUS | Status: AC
  Administered 2017-05-19: 200 mg via INTRAVENOUS
  Filled 2017-05-19: qty 200

## 2017-05-19 MED ORDER — ANIDULAFUNGIN 100 MG IV SOLR
100.0000 mg | INTRAVENOUS | Status: DC
Start: 2017-05-20 — End: 2017-05-29
  Administered 2017-05-20 – 2017-05-29 (×10): 100 mg via INTRAVENOUS
  Filled 2017-05-19 (×10): qty 100

## 2017-05-19 MED ORDER — INSULIN ASPART 100 UNIT/ML ~~LOC~~ SOLN
0.0000 [IU] | SUBCUTANEOUS | Status: DC
  Administered 2017-05-20: 3 [IU] via SUBCUTANEOUS
  Administered 2017-05-20 – 2017-05-22 (×3): 2 [IU] via SUBCUTANEOUS
  Administered 2017-05-23: 5 [IU] via SUBCUTANEOUS
  Administered 2017-05-23: 2 [IU] via SUBCUTANEOUS
  Administered 2017-05-23 (×2): 3 [IU] via SUBCUTANEOUS
  Administered 2017-05-23: 7 [IU] via SUBCUTANEOUS
  Administered 2017-05-23 – 2017-05-24 (×2): 5 [IU] via SUBCUTANEOUS

## 2017-05-19 MED ORDER — FAMOTIDINE IN NACL 20-0.9 MG/50ML-% IV SOLN
20.0000 mg | INTRAVENOUS | Status: DC
  Administered 2017-05-19 – 2017-05-20 (×2): 20 mg via INTRAVENOUS
  Filled 2017-05-19 (×2): qty 50

## 2017-05-19 MED ORDER — SODIUM CHLORIDE 0.9 % IV SOLN
3.0000 g | Freq: Three times a day (TID) | INTRAVENOUS | Status: DC
  Administered 2017-05-19 – 2017-05-20 (×5): 3 g via INTRAVENOUS
  Filled 2017-05-19 (×5): qty 3

## 2017-05-19 NOTE — Progress Notes (Signed)
Nutrition Follow-up  DOCUMENTATION CODES:   Not applicable  INTERVENTION:  TPN initiation Pt needs: Kcal:  2100-2400 Protein:  105-120 grams Fluid:  2.1 - 2.4 L/day  NUTRITION DIAGNOSIS:   Increased nutrient needs related to (cancer) as evidenced by estimated needs.  Ongoing   GOAL:   Patient will meet greater than or equal to 90% of their needs  Ongoing - not met  MONITOR:   PO intake, Supplement acceptance, Diet advancement, Weight trends, Labs, Skin, I & O's  REASON FOR ASSESSMENT:   Consult Diet education, Calorie Count  ASSESSMENT:   72 year old male here with iron deficiency anemia with recent rectal bleeding found to have abnormal CT scan of the abdomen and pelvis suggestive of a sigmoid cancer.  He has a history of peripheral vascular disease, aortic aneurysm with endovascular repair, CAD, hypertension, hyperlipidemia, COPD, CKD.  Surgery performed 5/1 fo ileostomy. Has not yet seen oncology. Per MD note pt now with bowel leak. Pt eating variable amounts of food 0-100%; typically < 25%. Pt has been on and off NPO only progressing past clear liquids 3 out of 13 days; pt without adequate nutrition for > 10 days. Pt at high risk for malnutrition with cancer diagnosis and limited PO intake.  Upon dietetic intern visit pt was lethargic and unable to rouse.   Spoke with nurse at bedside and MD over the phone earlier in the day; hopes to advance diet and prevent TPN, however, now pt has increased abdominal pain and distention - will initiate TPN.   Medications reviewed: solu-cortef, novolog 0-9 units, unasyn, eraxis, pepcid, sodium bicarbonate 125 ml/hr, vancocin, dextrose 50% solution 25 ml (low CBG).    Labs reviewed: sodium 132 (L), CO2 17 (L), creatinine 1.71 (H), GFR 38 (L), WBC 14.6 (H), RBC 3.79 (L), hemoglobin 9 (L), HCT 30.2 (L), CBG 165 (H).    Intake/Output Summary (Last 24 hours) at 05/19/2017 1003 Last data filed at 05/19/2017 0900 Gross per 24 hour   Intake 5623.75 ml  Output 5810 ml  Net -186.25 ml   Lab Results  Component Value Date   HGBA1C 4.9 05/06/2017    Diet Order:   Diet Order           Diet clear liquid Room service appropriate? Yes; Fluid consistency: Thin  Diet effective now          EDUCATION NEEDS:   Not appropriate for education at this time  Skin:  Skin Assessment: Skin Integrity Issues: Skin Integrity Issues:: Incisions Incisions: closed abdomen  Last BM:  5/5 - ileostomy   Height:   Ht Readings from Last 1 Encounters:  05/11/17 6' 0.99" (1.854 m)    Weight:   Wt Readings from Last 1 Encounters:  05/11/17 208 lb 5.4 oz (94.5 kg)    Ideal Body Weight:  83.6 kg  BMI:  Body mass index is 27.49 kg/m.  Estimated Nutritional Needs:   Kcal:  2100-2400  Protein:  105-120 grams  Fluid:  2.1 - 2.4 L/day    Hope Budds, Dietetic Intern

## 2017-05-19 NOTE — Progress Notes (Signed)
Pharmacy Antibiotic Note  Jesse Le is a 72 y.o. male admitted on 05/06/2017 with sepsis.  Pharmacy has been consulted for Unasyn/Eraxis dosing. Already on vancomycin. WBC elevated. Noted renal dysfunction.   Plan: Already on vancomycin Add Unasyn 3g IV q8h Add Eraxis 200 mg IV x 1, then 100 mg IV q24h Trend WBC, temp, renal function  F/U infectious work-up Drug levels as indicated   Height: 6' 0.99" (185.4 cm) Weight: 208 lb 5.4 oz (94.5 kg) IBW/kg (Calculated) : 79.88  Temp (24hrs), Avg:97.6 F (36.4 C), Min:97.5 F (36.4 C), Max:97.8 F (36.6 C)  Recent Labs  Lab 05/16/17 0230 05/17/17 0359 05/17/17 0855 05/18/17 0223 05/18/17 1348 05/18/17 2119 05/18/17 2133 05/19/17 0050  WBC 11.6*  --  12.3* 14.9* 15.7* 14.1*  --   --   CREATININE 1.27* 1.31*  --  1.83* 1.80* 1.79*  --   --   LATICACIDVEN  --   --   --   --   --   --  3.4* 1.7    Estimated Creatinine Clearance: 42.2 mL/min (A) (by C-G formula based on SCr of 1.79 mg/dL (H)).    No Known Allergies   Narda Bonds 05/19/2017 2:14 AM

## 2017-05-19 NOTE — Progress Notes (Signed)
PULMONARY / CRITICAL CARE MEDICINE   Name: Loic Hobin MRN: [id number] DOB: April 24, 1945    ADMISSION DATE:  05/06/2017 CONSULTATION DATE:  05/18/2017  REFERRING MD:  Dr. Terrence Dupont  CHIEF COMPLAINT:  Hypotension  HISTORY OF PRESENT ILLNESS:   72 yo male presented with abdominal pain, weight loss and intermittent hematochezia.  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  Had low anterior resection with primary anastomosis with diverting loop ileostomy.  Developed hypotension, hypoglycemia 5/07.  Developed hypotension and altered mental status 5/08 and transferred to ICU. PMHx of CAD, DM, TAA.  SUBJECTIVE:  C/o abdominal discomfort when moving.  Breathing okay.  Denies chest pain.  VITAL SIGNS: BP 118/62 (BP Location: Right Arm)   Pulse 88   Temp 97.8 F (36.6 C) (Oral)   Resp 16   Ht 6' 0.99" (1.854 m)   Wt 208 lb 5.4 oz (94.5 kg)   SpO2 100%   BMI 27.49 kg/m   INTAKE / OUTPUT: I/O last 3 completed shifts: In: 6990.5 [P.O.:300; I.V.:4503.3; IV Piggyback:2187.2] Out: 7005 [KWIOX:7353; Drains:155; Stool:5200]  PHYSICAL EXAMINATION:  General - somnolent Eyes - pupils reactive ENT - poor dentition Cardiac - regular, no murmur Chest - no wheeze, rales Abd - ileostomy and JP drain with feculent material Ext - no edema Skin - no rashes Neuro - follows simple commands   LABS:  BMET Recent Labs  Lab 05/18/17 1348 05/18/17 2119 05/19/17 0310  NA 135 130* 132*  K 4.2 4.1 3.8  CL 113* 108 110  CO2 15* 14* 17*  BUN 13 13 11   CREATININE 1.80* 1.79* 1.71*  GLUCOSE 75 54* 93    Electrolytes Recent Labs  Lab 05/16/17 0230  05/18/17 1348 05/18/17 2119 05/19/17 0310  CALCIUM 8.1*   < > 8.3* 7.7* 7.4*  MG 1.8  --   --   --   --    < > = values in this interval not displayed.    CBC Recent Labs  Lab 05/18/17 1348 05/18/17 2119 05/19/17 0310  WBC 15.7* 14.1* 14.6*  HGB 10.0* 9.7* 9.0*  HCT 34.9* 32.7* 30.2*  PLT 236 199 224     Coag's Recent Labs  Lab 05/19/17 0050  INR 1.60    Sepsis Markers Recent Labs  Lab 05/18/17 2133 05/19/17 0050  LATICACIDVEN 3.4* 1.7    ABG Recent Labs  Lab 05/18/17 1258  PHART 7.287*  PCO2ART 24.1*  PO2ART 80.7*    Liver Enzymes No results for input(s): AST, ALT, ALKPHOS, BILITOT, ALBUMIN in the last 168 hours.  Cardiac Enzymes No results for input(s): TROPONINI, PROBNP in the last 168 hours.  Glucose Recent Labs  Lab 05/18/17 2010 05/18/17 2129 05/18/17 2240 05/18/17 2351 05/19/17 0049 05/19/17 0318  GLUCAP 85 55* 79 71 93 85    Imaging Ct Abdomen Pelvis Wo Contrast  Result Date: 05/19/2017 CLINICAL DATA:  Uncontrolled fistula. EXAM: CT ABDOMEN AND PELVIS WITHOUT CONTRAST TECHNIQUE: Multidetector CT imaging of the abdomen and pelvis was performed following the standard protocol without IV contrast. COMPARISON:  12/26/2012 FINDINGS: Lower chest: Stented thoracic aortic aneurysm. Lower lobe atelectasis. Hepatobiliary: No focal liver abnormality.Cholelithiasis. No signs of cholecystitis. Pancreas: Unremarkable. Spleen: Unremarkable. Adrenals/Urinary Tract: Negative adrenals. Severe left renal atrophy, likely from old vascular injury. Left renal cystic density. Right renal isodense but stable from 2014 lesion compatible with cyst. The bladder is decompressed by Foley catheter. Stomach/Bowel: Status post low anterior resection. There is a visible discontinuity in the posterior wall  just above the anastomosis with feculent appearing material and gas in the pelvis, measuring up to 5x 3 cm anterior and superior to the sigmoid colon. Oral contrast has reached the proximal small bowel only. Mild generalized fluid filling of small bowel. No obstructive process is seen. Negative appendix. Stranding around the ascending colon is likely postoperative. Fluid collection right lower quadrant measuring up to 6.4 x 3.5 cm. Moderate pneumoperitoneum. Vascular/Lymphatic: Aortoiliac  stent grafting with multiple visceral stents. No mass or adenopathy. Reproductive:Negative Other: Low abdominal wall incision is open and packed. No undermining or collection. Musculoskeletal: Extensive degenerative changes. Multilevel bridging osteophyte. No acute osseous finding. A call is being placed to the ordering provider. IMPRESSION: 1. Visible 6 mm defect in the posterior colon wall just above the colocolonic anastomosis with feculent material tracking superiorly and anteriorly. The largest gas and feculent collection measures 5 x 3 cm superior to the sigmoid colon. The surgical drain is in continuity with the collection. 2. Likely contiguous 6 x 4 cm fluid collection in the right lower quadrant with mesenteric fat edema. 3. Moderate pneumoperitoneum. 4. Oral contrast only reached the proximal jejunum. Probable mild ileus. 5. Open low abdominal wall with packing. No undermining or subcutaneous collection. 6. Cholelithiasis Electronically Signed   By: Monte Fantasia M.D.   On: 05/19/2017 07:12   Dg Chest Port 1 View  Result Date: 05/19/2017 CLINICAL DATA:  72 year old male with left-sided line placement. EXAM: PORTABLE CHEST 1 VIEW COMPARISON:  Chest CT dated 05/06/2017 FINDINGS: Left IJ central line with tip over mid SVC. No pneumothorax. Left lung base atelectatic changes versus infiltrate. The right lung is clear. No pleural effusion or pneumothorax. An endovascular stent graft repair of the aorta noted. IMPRESSION: 1. Left IJ central line with tip over mid SVC.  No pneumothorax. 2. Left lung base atelectasis/infiltrate. Electronically Signed   By: Anner Crete M.D.   On: 05/19/2017 00:13     STUDIES:  CT abd 4/26 >> 9 cm mass CT abd 5/09 >> discontinuity in the posterior wall just above the anastomosis with feculent appearing material and gas in the pelvis, measuring up to 5x 3 cm anterior and superior to the sigmoid colon.  Fluid collection right lower quadrant measuring up to 6.4 x 3.5  cm. Moderate pneumoperitoneum.  CULTURES: Blood culture 5/8 >  ANTIBIOTICS: Zosyn 5/7 > 5/8 Unasyn 5/8 > Vancomycin 5/7 > Eraxis 5/8 >  SIGNIFICANT EVENTS: 4/26 admit for abdominal pain. Mass identified on CT 4/28 colonoscopy. Biopsy sent.  5/1 bx grows adeno. To OR for resection ileostomy 5/8 hypotension hypoglycemia AMS, transfer to ICU  LINES/TUBES: Lt IJ CVL  5/8 >  DISCUSSION: 72 yo male with sepsis from peritonitis after surgery for colon cancer and CT abd concerning for anastomotic leak.  ASSESSMENT / PLAN:  Sepsis from peritonitis. Colon cancer s/p low anterior resection with ileostomy. - continue ABx - surgery following - continue IV fluids  Acute renal failure from ATN. Non anion gap metabolic acidosis. - continue HCO3 in IV fluid - f/u BMET, ABG  Anemia of critical illness. - f/u CBC  Hx of CAD. - hold ASA while NPO  DM type II. Hypoglycemia in setting of sepsis. - Dextrose in IV fluids  Acute metabolic encephalopathy 2nd to sepsis. - monitor neuro status  Relative adrenal insufficiency. - wean off solucortef  DVT prophylaxis - lovenox SUP - pepcic Nutrition - NPO Goals of care - full code  CC time 31 minutes  Chesley Mires, MD [facility] Pulmonary/Critical  Care 05/19/2017, 7:55 AM

## 2017-05-19 NOTE — Progress Notes (Signed)
[facility] Progress Note Patient Name: Jesse Le DOB: 11-Sep-1945 MRN: [id number]   Date of Service  05/19/2017  HPI/Events of Note  sharepoint for hyperglycemia  eICU Interventions  Decreased d10 to 23ml/hr from 157ml/hr     Intervention Category Intermediate Interventions: Hyperglycemia - evaluation and treatment  Sharia Reeve 05/19/2017, 9:05 PM

## 2017-05-19 NOTE — Progress Notes (Addendum)
OT Cancellation Note  Patient Details Name: Cap Massi MRN: [id number] DOB: 1945-11-28   Cancelled Treatment:    Reason Eval/Treat Not Completed: Medical issues which prohibited therapy. Spoke to RN and she reports pt not appropriate for therapy today due to medical issues and note OT orders have been cancelled, we will sign off. Please re-order as appropriate.  Almon Register [phone] 05/19/2017, 8:48 AM

## 2017-05-19 NOTE — Progress Notes (Signed)
OT Cancellation Note  Patient Details Name: Jesse Le MRN: [id number] DOB: 1945/02/10   Cancelled Treatment:    Reason Eval/Treat Not Completed: Other (comment). Order had been discontinued, now we have new order. Spoke with RN who says pt's abdomen is more swollen than this AM and wants [place] to hold.  Almon Register [phone] 05/19/2017, 12:34 PM

## 2017-05-19 NOTE — Progress Notes (Signed)
Called by RN as patient started having worsening abdominal distention and 10/10 abdominal pain.  He was given 1mg  of dilaudid before I arrived.  He was sleeping upon my arrival.  He would arouse, but was not terribly interactive.  He did state that his pain was worse.  Abd: soft, but is more distended this afternoon.  He is more tender in LLQ than he was this morning.  At this point, we will need to closely monitor him.  It is difficult to tell his true change secondary to his pain medication that was just given prior to my arrival.  His vitals are stable.  BP is 129/66 and his heart rate is 90.  He will be made NPO and given his limited oral intake over his stay, we will insert a PICC line and go ahead and start TNA.  Jesse Le 1:35 PM 05/19/2017

## 2017-05-19 NOTE — Progress Notes (Signed)
[facility] CONSULT NOTE   Pharmacy Consult for TPN Indication: Prolonged Ileus  Patient Measurements: Height: 6' 0.99" (185.4 cm) Weight: 208 lb 5.4 oz (94.5 kg) IBW/kg (Calculated) : 79.88 TPN AdjBW (KG): 94.5 Body mass index is 27.49 kg/m.  Assessment:  72 year old male with rectosigmoid colon adenocarcinoma mass s/p lower anterior resection with primary colorectal anastomosis and diverting loop ileostomy with umbilical hernia repair on 05/11/17. Patient's wound was opened POD#5 due to ileostomy leakage. Suspected anastomotic leak secondary to feculent output in JP drain. Patient currently with high ileostomy output. Patient's abdomen has become more distended and patient was made NPO. Patient was on clear liquids but only tolerating minimal intake over last 6 days. Pharmacy consulted for TPN.   Current Nutrition:  NPO  Plan:  TPN ordered after cut-off time. Will order labs and start TPN in AM.  Sloan Leiter, PharmD, BCPS, BCCCP Clinical Pharmacist Clinical phone 05/19/2017 until 3:30PM - #[phone] After hours, please call [phone] 05/19/2017,2:42 PM

## 2017-05-19 NOTE — Progress Notes (Signed)
Subjective:  Patient is drowsy but responds to verbal commands appropriately. Blood pressure and urine output has improved renal function slowly improving  Objective:  Vital Signs in the last 24 hours: Temp:  [97.3 F (36.3 C)-97.8 F (36.6 C)] 97.3 F (36.3 C) (05/09 0700) Pulse Rate:  [74-95] 88 (05/09 0700) Resp:  [14-21] 16 (05/09 0700) BP: (74-129)/(42-94) 118/62 (05/09 0700) SpO2:  [97 %-100 %] 100 % (05/09 0700)  Intake/Output from previous day: 05/08 0701 - 05/09 0700 In: 6024.3 [I.V.:3937.1; IV Piggyback:2087.2] Out: 3382 [NKNLZ:7673; Drains:130; Stool:2950] Intake/Output from this shift: No intake/output data recorded.  Physical Exam: Neck: no adenopathy, no carotid bruit, no JVD and supple, symmetrical, trachea midline Lungs: clear to auscultation bilaterally Heart: regular rate and rhythm, S1, S2 normal and soft systolic murmur noted Abdomen: soft bowel sounds present in the ostomy site pink JP catheter with fluctuant material Extremities: extremities normal, atraumatic, no cyanosis or edema  Lab Results: Recent Labs    05/18/17 2119 05/19/17 0310  WBC 14.1* 14.6*  HGB 9.7* 9.0*  PLT 199 224   Recent Labs    05/18/17 2119 05/19/17 0310  NA 130* 132*  K 4.1 3.8  CL 108 110  CO2 14* 17*  GLUCOSE 54* 93  BUN 13 11  CREATININE 1.79* 1.71*   No results for input(s): TROPONINI in the last 72 hours.  Invalid input(s): CK, MB Hepatic Function Panel No results for input(s): PROT, ALBUMIN, AST, ALT, ALKPHOS, BILITOT, BILIDIR, IBILI in the last 72 hours. No results for input(s): CHOL in the last 72 hours. No results for input(s): PROTIME in the last 72 hours.  Imaging: Imaging results have been reviewed and Ct Abdomen Pelvis Wo Contrast  Result Date: 05/19/2017 CLINICAL DATA:  Uncontrolled fistula. EXAM: CT ABDOMEN AND PELVIS WITHOUT CONTRAST TECHNIQUE: Multidetector CT imaging of the abdomen and pelvis was performed following the standard protocol without  IV contrast. COMPARISON:  12/26/2012 FINDINGS: Lower chest: Stented thoracic aortic aneurysm. Lower lobe atelectasis. Hepatobiliary: No focal liver abnormality.Cholelithiasis. No signs of cholecystitis. Pancreas: Unremarkable. Spleen: Unremarkable. Adrenals/Urinary Tract: Negative adrenals. Severe left renal atrophy, likely from old vascular injury. Left renal cystic density. Right renal isodense but stable from 2014 lesion compatible with cyst. The bladder is decompressed by Foley catheter. Stomach/Bowel: Status post low anterior resection. There is a visible discontinuity in the posterior wall just above the anastomosis with feculent appearing material and gas in the pelvis, measuring up to 5x 3 cm anterior and superior to the sigmoid colon. Oral contrast has reached the proximal small bowel only. Mild generalized fluid filling of small bowel. No obstructive process is seen. Negative appendix. Stranding around the ascending colon is likely postoperative. Fluid collection right lower quadrant measuring up to 6.4 x 3.5 cm. Moderate pneumoperitoneum. Vascular/Lymphatic: Aortoiliac stent grafting with multiple visceral stents. No mass or adenopathy. Reproductive:Negative Other: Low abdominal wall incision is open and packed. No undermining or collection. Musculoskeletal: Extensive degenerative changes. Multilevel bridging osteophyte. No acute osseous finding. A call is being placed to the ordering provider. IMPRESSION: 1. Visible 6 mm defect in the posterior colon wall just above the colocolonic anastomosis with feculent material tracking superiorly and anteriorly. The largest gas and feculent collection measures 5 x 3 cm superior to the sigmoid colon. The surgical drain is in continuity with the collection. 2. Likely contiguous 6 x 4 cm fluid collection in the right lower quadrant with mesenteric fat edema. 3. Moderate pneumoperitoneum. 4. Oral contrast only reached the proximal jejunum. Probable mild ileus.  5. Open  low abdominal wall with packing. No undermining or subcutaneous collection. 6. Cholelithiasis Electronically Signed   By: Monte Fantasia M.D.   On: 05/19/2017 07:12   Dg Chest Port 1 View  Result Date: 05/19/2017 CLINICAL DATA:  72 year old male with left-sided line placement. EXAM: PORTABLE CHEST 1 VIEW COMPARISON:  Chest CT dated 05/06/2017 FINDINGS: Left IJ central line with tip over mid SVC. No pneumothorax. Left lung base atelectatic changes versus infiltrate. The right lung is clear. No pleural effusion or pneumothorax. An endovascular stent graft repair of the aorta noted. IMPRESSION: 1. Left IJ central line with tip over mid SVC.  No pneumothorax. 2. Left lung base atelectasis/infiltrate. Electronically Signed   By: Anner Crete M.D.   On: 05/19/2017 00:13    Cardiac Studies:  Assessment/Plan:  Rectosigmoid adenocarcinoma status post resection with primary colorectal anastomosis and dilating loop ileostomy and umbilical hernia repair Inferior surgical wound infection Status post hypotensive shock secondary to intra-abdominal sepsis. Acute on chronic renal injury secondary to hypotension/sepsis. 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 Monitor blood sugar and urine output closely .  LOS: 13 days    Charolette Forward 05/19/2017, 8:35 AM

## 2017-05-19 NOTE — Progress Notes (Signed)
CT abd order clarified with Dr. Barry Dienes. She does want pt to take oral contrast for the CT, just NPO otherwise. Said okay to place NG if pt cannot tolerate contrast orally.

## 2017-05-19 NOTE — Progress Notes (Signed)
PT Cancellation Note  Patient Details Name: Ilija Maxim MRN: [id number] DOB: 30-Oct-1945   Cancelled Treatment:    Reason Eval/Treat Not Completed: Medical issues which prohibited therapy(pt with transfer from 2w to [place] with sepsis, order discontinued by MD)   Sandy Salaam Berdia Lachman 05/19/2017, 9:33 AM Elwyn Reach, [place]

## 2017-05-19 NOTE — Progress Notes (Signed)
PT Cancellation Note  Patient Details Name: Jesse Le MRN: [id number] DOB: February 20, 1945   Cancelled Treatment:    Reason Eval/Treat Not Completed: Medical issues which prohibited therapy(2nd order placed after last order D/Cd this morning. Pt with abdominal distension and not appropriate)   Talvin Christianson B Graci Hulce 05/19/2017, 12:34 PM  Elwyn Reach, [place]

## 2017-05-19 NOTE — Progress Notes (Signed)
Summary note: While rounding, [name] approached me regarding Jesse Le' intermittent hypotension and hypoglycemia.  [name] notified Dr. Terrence Dupont of further trend in dropping glucose and hypotension.  Dr. Terrence Dupont gave orders for D50 and norepinephrine infusion.  Dr. Terrence Dupont consulted PCCM and Dr. Zacarias Pontes came to the bedside.  Labs were obtained and additional peripheral IV access.  NS bolus 2L initiated.  Pt transferred to [place].

## 2017-05-19 NOTE — Addendum Note (Signed)
Addendum  created 05/19/17 0881 by Suzette Battiest, MD   Intraprocedure Staff edited

## 2017-05-19 NOTE — Consult Note (Addendum)
[facility] Nurse ostomy follow up Surgical team following for assessment and plan of care for abd wound.  Stoma type/location:Ileostomy surgery performed on 5/1 Stoma red and viable, 1 inch, above skin level. Red rubber rod removed per surgical team order.  There is a full thickness wound to outer stoma where rod was previously located; .2X.2X.2cm, applied barrier ring to assist with protecting site and to promote healing. Output: large amt liquid brown stool Ostomy pouching: 2pc.  Education provided:Pt is in ICU and was very groggy. No family present for teaching session.Placed in high output pouch attached to bedside drainage bag. Supplies at bedside for staff nurse use, educational materials left in the room.  Pt will not be able to change or empty pouch without assistance; recommend home health assistance upon discharge; please order if desired. Enrolled patient in [facility] Discharge program:Yes [facility] team will continue to follow while in the hospital for further teaching sessions when stable and out of ICU. Julien Girt MSN, RN, [place], [place], [place]

## 2017-05-19 NOTE — Progress Notes (Signed)
Patient ID: Jesse Le, male   DOB: [birth year], 72 y.o.   MRN: [id number]    8 Days Post-Op  Subjective: Patient feels about the same today.  He is actually more alert and joking a little bit.  CBG 165.  States his abdominal pain is about the same, no worse.  No nausea.  Ate some of his fulls yesterday.  Ostomy output is down.  Objective: Vital signs in last 24 hours: Temp:  [97.3 F (36.3 C)-97.8 F (36.6 C)] 97.3 F (36.3 C) (05/09 0700) Pulse Rate:  [74-95] 88 (05/09 0700) Resp:  [14-21] 16 (05/09 0700) BP: (74-129)/(42-94) 118/62 (05/09 0700) SpO2:  [97 %-100 %] 100 % (05/09 0700) Last BM Date: 05/19/17  Intake/Output from previous day: 05/08 0701 - 05/09 0700 In: 6024.3 [I.V.:3937.1; IV Piggyback:2087.2] Out: 4742 [VZDGL:8756; Drains:130; EPPIR:5188] Intake/Output this shift: No intake/output data recorded.  PE: Gen: NAD,. More awake and alert Heart: regular Lungs: CTAB Abd: soft, slightly more distended, but +BS, ileostomy with more thick succus today than frank fluid. 2900cc total yesterday.  LLQ with feculent drainage, drain site is clean.  Midline wound is stable, still with some necrotic tissue at the base, but the sides of the wound with granulation tissue.    Lab Results:  Recent Labs    05/18/17 2119 05/19/17 0310  WBC 14.1* 14.6*  HGB 9.7* 9.0*  HCT 32.7* 30.2*  PLT 199 224   BMET Recent Labs    05/18/17 2119 05/19/17 0310  NA 130* 132*  K 4.1 3.8  CL 108 110  CO2 14* 17*  GLUCOSE 54* 93  BUN 13 11  CREATININE 1.79* 1.71*  CALCIUM 7.7* 7.4*   PT/INR Recent Labs    05/19/17 0050  LABPROT 18.9*  INR 1.60   CMP     Component Value Date/Time   NA 132 (L) 05/19/2017 0310   K 3.8 05/19/2017 0310   CL 110 05/19/2017 0310   CO2 17 (L) 05/19/2017 0310   GLUCOSE 93 05/19/2017 0310   BUN 11 05/19/2017 0310   CREATININE 1.71 (H) 05/19/2017 0310   CALCIUM 7.4 (L) 05/19/2017 0310   PROT 6.7 05/07/2017 0332   ALBUMIN 2.2 (L) 05/07/2017 0332   AST 14 (L) 05/07/2017 0332   ALT 7 (L) 05/07/2017 0332   ALKPHOS 44 05/07/2017 0332   BILITOT 1.2 05/07/2017 0332   GFRNONAA 38 (L) 05/19/2017 0310   GFRAA 44 (L) 05/19/2017 0310   Lipase     Component Value Date/Time   LIPASE 22 05/06/2017 1036       Studies/Results: Ct Abdomen Pelvis Wo Contrast  Result Date: 05/19/2017 CLINICAL DATA:  Uncontrolled fistula. EXAM: CT ABDOMEN AND PELVIS WITHOUT CONTRAST TECHNIQUE: Multidetector CT imaging of the abdomen and pelvis was performed following the standard protocol without IV contrast. COMPARISON:  12/26/2012 FINDINGS: Lower chest: Stented thoracic aortic aneurysm. Lower lobe atelectasis. Hepatobiliary: No focal liver abnormality.Cholelithiasis. No signs of cholecystitis. Pancreas: Unremarkable. Spleen: Unremarkable. Adrenals/Urinary Tract: Negative adrenals. Severe left renal atrophy, likely from old vascular injury. Left renal cystic density. Right renal isodense but stable from 2014 lesion compatible with cyst. The bladder is decompressed by Foley catheter. Stomach/Bowel: Status post low anterior resection. There is a visible discontinuity in the posterior wall just above the anastomosis with feculent appearing material and gas in the pelvis, measuring up to 5x 3 cm anterior and superior to the sigmoid colon. Oral contrast has reached the proximal small bowel only. Mild generalized fluid filling of small bowel. No obstructive  process is seen. Negative appendix. Stranding around the ascending colon is likely postoperative. Fluid collection right lower quadrant measuring up to 6.4 x 3.5 cm. Moderate pneumoperitoneum. Vascular/Lymphatic: Aortoiliac stent grafting with multiple visceral stents. No mass or adenopathy. Reproductive:Negative Other: Low abdominal wall incision is open and packed. No undermining or collection. Musculoskeletal: Extensive degenerative changes. Multilevel bridging osteophyte. No acute osseous finding. A call is being placed to  the ordering provider. IMPRESSION: 1. Visible 6 mm defect in the posterior colon wall just above the colocolonic anastomosis with feculent material tracking superiorly and anteriorly. The largest gas and feculent collection measures 5 x 3 cm superior to the sigmoid colon. The surgical drain is in continuity with the collection. 2. Likely contiguous 6 x 4 cm fluid collection in the right lower quadrant with mesenteric fat edema. 3. Moderate pneumoperitoneum. 4. Oral contrast only reached the proximal jejunum. Probable mild ileus. 5. Open low abdominal wall with packing. No undermining or subcutaneous collection. 6. Cholelithiasis Electronically Signed   By: Monte Fantasia M.D.   On: 05/19/2017 07:12   Dg Chest Port 1 View  Result Date: 05/19/2017 CLINICAL DATA:  72 year old male with left-sided line placement. EXAM: PORTABLE CHEST 1 VIEW COMPARISON:  Chest CT dated 05/06/2017 FINDINGS: Left IJ central line with tip over mid SVC. No pneumothorax. Left lung base atelectatic changes versus infiltrate. The right lung is clear. No pleural effusion or pneumothorax. An endovascular stent graft repair of the aorta noted. IMPRESSION: 1. Left IJ central line with tip over mid SVC.  No pneumothorax. 2. Left lung base atelectasis/infiltrate. Electronically Signed   By: Anner Crete M.D.   On: 05/19/2017 00:13    Anti-infectives: Anti-infectives (From admission, onward)   Start     Dose/Rate Route Frequency Ordered Stop   05/20/17 0500  anidulafungin (ERAXIS) 100 mg in sodium chloride 0.9 % 100 mL IVPB     100 mg 78 mL/hr over 100 Minutes Intravenous Every 24 hours 05/19/17 0116     05/19/17 2200  vancomycin (VANCOCIN) 1,500 mg in sodium chloride 0.9 % 500 mL IVPB     1,500 mg 250 mL/hr over 120 Minutes Intravenous Every 24 hours 05/18/17 2126     05/19/17 0130  anidulafungin (ERAXIS) 200 mg in sodium chloride 0.9 % 200 mL IVPB     200 mg 78 mL/hr over 200 Minutes Intravenous  Once 05/19/17 0116 05/19/17  0520   05/19/17 0130  Ampicillin-Sulbactam (UNASYN) 3 g in sodium chloride 0.9 % 100 mL IVPB     3 g 200 mL/hr over 30 Minutes Intravenous Every 8 hours 05/19/17 0118     05/18/17 2200  vancomycin (VANCOCIN) 1,750 mg in sodium chloride 0.9 % 500 mL IVPB     1,750 mg 250 mL/hr over 120 Minutes Intravenous  Once 05/18/17 2126 05/19/17 0400   05/17/17 0930  piperacillin-tazobactam (ZOSYN) IVPB 3.375 g  Status:  Discontinued     3.375 g 12.5 mL/hr over 240 Minutes Intravenous Every 8 hours 05/17/17 0836 05/19/17 0118   05/11/17 2200  cefoTEtan (CEFOTAN) 2 g in sodium chloride 0.9 % 100 mL IVPB     2 g 200 mL/hr over 30 Minutes Intravenous Every 12 hours 05/11/17 1839 05/11/17 2034   05/11/17 0600  cefoTEtan in Dextrose 5% (CEFOTAN) IVPB 2 g     2 g Intravenous On call to O.R. 05/10/17 1142 05/11/17 1416       Assessment/Plan Coronary artery disease. History of myocardial infarction Hypertension Status post thoracic aortic dissection  and stent placement  status post bilateral iliac stent placements COPD AKI/CKD II- creatinine down to 1.7 this morning after aggressive hydration yesterday.cont fluids at 125cc -UOP up to 1650cc yesterday and urine much clearer NIDDM - having issues with hypoglycemia currently.  On D10 fluid and getting amps of D50 per medicine. Chronic anemia- hgb9.0, stable Obesity Hypokalemia -resolved Hypotension - resolved with fluids.    Reducible umbilical hernia Rectosigmoid colon mass- S/p LAR with primary colorectal (EE)anastomosis and diverting loop ileostomy with umbilical hernia repair 0/0/34 Dr. 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 Base of wound is necrotic, but fascia is still intact.  -anastomotic leak secondary to feculent output in JP drain and seen on CT scan overnight. -we had him on Zosyn, but this was switched  overnight to unasyn, Vanc, and antifungal cover.  Unasyn's antibiogram does not have as good of gram - coverage as zosyn and question this change.  -patient actually looks much better today than previous days.  His labs are improving and his vitals are improved.  CT scan findings noted.  As of now, his leak is controlled with his drain.  At some point, if this changes, then he may require a new drain to be placed, but does not need that currently.  No needs for surgical re-exploration at this time either.  -having high ileostomy output improved yesterday maxed out on imodium, lomotil, and fiber.  Will follow.  Do not want to slow his gut down, but need to keep this output controlled in order minimized dehydration as well electrolyte balance  -needs an oncology consult at some point   JZP:HXTAV liquids/IVFs (125cc/hr) VTE: SCDs, lovenox WP:VXYIA 5/7 -->5/9  Unasyn 5/9-->  Vanc 5/9-->     LOS: 13 days    [name] , [facility] Surgery 05/19/2017, 8:47 AM Pager: [phone]

## 2017-05-20 LAB — DIFFERENTIAL
BASOS ABS: 0 10*3/uL (ref 0.0–0.1)
Basophils Relative: 0 %
EOS PCT: 1 %
Eosinophils Absolute: 0.1 10*3/uL (ref 0.0–0.7)
Lymphocytes Relative: 13 %
Lymphs Abs: 1 10*3/uL (ref 0.7–4.0)
MONO ABS: 0.1 10*3/uL (ref 0.1–1.0)
MONOS PCT: 1 %
NEUTROS PCT: 85 %
Neutro Abs: 6.5 10*3/uL (ref 1.7–7.7)

## 2017-05-20 LAB — GLUCOSE, CAPILLARY
GLUCOSE-CAPILLARY: 128 mg/dL — AB (ref 65–99)
GLUCOSE-CAPILLARY: 129 mg/dL — AB (ref 65–99)
GLUCOSE-CAPILLARY: 153 mg/dL — AB (ref 65–99)
GLUCOSE-CAPILLARY: 242 mg/dL — AB (ref 65–99)
GLUCOSE-CAPILLARY: 315 mg/dL — AB (ref 65–99)
GLUCOSE-CAPILLARY: 77 mg/dL (ref 65–99)
GLUCOSE-CAPILLARY: 94 mg/dL (ref 65–99)
Glucose-Capillary: 171 mg/dL — ABNORMAL HIGH (ref 65–99)
Glucose-Capillary: 26 mg/dL — CL (ref 65–99)
Glucose-Capillary: 112 mg/dL — ABNORMAL HIGH (ref 65–99)
Glucose-Capillary: 85 mg/dL (ref 65–99)
Glucose-Capillary: 72 mg/dL (ref 65–99)
Glucose-Capillary: 143 mg/dL — ABNORMAL HIGH (ref 65–99)
Glucose-Capillary: 70 mg/dL (ref 65–99)
Glucose-Capillary: 89 mg/dL (ref 65–99)
Glucose-Capillary: 88 mg/dL (ref 65–99)
Glucose-Capillary: 81 mg/dL (ref 65–99)

## 2017-05-20 LAB — BLOOD GAS, ARTERIAL
Acid-base deficit: 5 mmol/L — ABNORMAL HIGH (ref 0.0–2.0)
BICARBONATE: 18.5 mmol/L — AB (ref 20.0–28.0)
DRAWN BY: 313941
O2 Saturation: 96.8 %
PATIENT TEMPERATURE: 97.6
PH ART: 7.44 (ref 7.350–7.450)
pCO2 arterial: 27.5 mmHg — ABNORMAL LOW (ref 32.0–48.0)
pO2, Arterial: 76.1 mmHg — ABNORMAL LOW (ref 83.0–108.0)

## 2017-05-20 LAB — COMPREHENSIVE METABOLIC PANEL
ALBUMIN: 1.2 g/dL — AB (ref 3.5–5.0)
ALT: 50 U/L (ref 17–63)
ANION GAP: 12 (ref 5–15)
AST: 85 U/L — AB (ref 15–41)
Alkaline Phosphatase: 76 U/L (ref 38–126)
BUN: 11 mg/dL (ref 6–20)
CHLORIDE: 101 mmol/L (ref 101–111)
CO2: 19 mmol/L — AB (ref 22–32)
Calcium: 7.4 mg/dL — ABNORMAL LOW (ref 8.9–10.3)
Creatinine, Ser: 1.77 mg/dL — ABNORMAL HIGH (ref 0.61–1.24)
GFR calc Af Amer: 42 mL/min — ABNORMAL LOW (ref >60)
GFR calc non Af Amer: 37 mL/min — ABNORMAL LOW (ref >60)
GLUCOSE: 115 mg/dL — AB (ref 65–99)
POTASSIUM: 3.6 mmol/L (ref 3.5–5.1)
SODIUM: 132 mmol/L — AB (ref 135–145)
TOTAL PROTEIN: 4.8 g/dL — AB (ref 6.5–8.1)
Total Bilirubin: 1.1 mg/dL (ref 0.3–1.2)

## 2017-05-20 LAB — CBC
HEMATOCRIT: 34.8 % — AB (ref 39.0–52.0)
HEMOGLOBIN: 10.8 g/dL — AB (ref 13.0–17.0)
MCH: 24.1 pg — ABNORMAL LOW (ref 26.0–34.0)
MCHC: 31 g/dL (ref 30.0–36.0)
MCV: 77.7 fL — ABNORMAL LOW (ref 78.0–100.0)
Platelets: 276 10*3/uL (ref 150–400)
RBC: 4.48 MIL/uL (ref 4.22–5.81)
RDW: 25.5 % — ABNORMAL HIGH (ref 11.5–15.5)
WBC: 7.7 10*3/uL (ref 4.0–10.5)

## 2017-05-20 LAB — PREALBUMIN: Prealbumin: <5 mg/dL — ABNORMAL LOW (ref 18–38)

## 2017-05-20 LAB — PHOSPHORUS
PHOSPHORUS: 2.8 mg/dL (ref 2.5–4.6)
Phosphorus: 7.4 mg/dL — ABNORMAL HIGH (ref 2.5–4.6)
Phosphorus: 3.4 mg/dL (ref 2.5–4.6)

## 2017-05-20 LAB — MAGNESIUM
MAGNESIUM: 1.8 mg/dL (ref 1.7–2.4)
Magnesium: 1.2 mg/dL — ABNORMAL LOW (ref 1.7–2.4)
Magnesium: 1.6 mg/dL — ABNORMAL LOW (ref 1.7–2.4)

## 2017-05-20 LAB — VANCOMYCIN, TROUGH: Vancomycin Tr: 15 ug/mL (ref 15–20)

## 2017-05-20 LAB — TRIGLYCERIDES: Triglycerides: 56 mg/dL (ref <150)

## 2017-05-20 MED ORDER — SODIUM CHLORIDE 0.9 % IV BOLUS
500.0000 mL | Freq: Once | INTRAVENOUS | Status: AC
  Administered 2017-05-20: 500 mL via INTRAVENOUS

## 2017-05-20 MED ORDER — HALOPERIDOL LACTATE 5 MG/ML IJ SOLN
4.0000 mg | Freq: Once | INTRAMUSCULAR | Status: AC
  Administered 2017-05-20: 4 mg via INTRAVENOUS

## 2017-05-20 MED ORDER — MAGNESIUM SULFATE 4 GM/100ML IV SOLN
4.0000 g | Freq: Once | INTRAVENOUS | Status: AC
  Administered 2017-05-20: 4 g via INTRAVENOUS
  Filled 2017-05-20: qty 100

## 2017-05-20 MED ORDER — METOPROLOL TARTRATE 5 MG/5ML IV SOLN
2.5000 mg | Freq: Once | INTRAVENOUS | Status: AC
  Administered 2017-05-20: 2.5 mg via INTRAVENOUS

## 2017-05-20 MED ORDER — SODIUM CHLORIDE 0.9 % IV BOLUS
250.0000 mL | Freq: Once | INTRAVENOUS | Status: AC
  Administered 2017-05-20: 250 mL via INTRAVENOUS

## 2017-05-20 MED ORDER — SODIUM CHLORIDE 0.9 % IV SOLN
INTRAVENOUS | Status: AC
  Administered 2017-05-20 – 2017-05-21 (×3): via INTRAVENOUS

## 2017-05-20 MED ORDER — POTASSIUM CHLORIDE 10 MEQ/50ML IV SOLN
10.0000 meq | INTRAVENOUS | Status: AC
  Administered 2017-05-20 (×2): 10 meq via INTRAVENOUS
  Filled 2017-05-20 (×2): qty 50

## 2017-05-20 MED ORDER — SODIUM CHLORIDE 0.9 % IV SOLN
INTRAVENOUS | Status: AC
  Administered 2017-05-20: 10:00:00 via INTRAVENOUS

## 2017-05-20 MED ORDER — CHLORHEXIDINE GLUCONATE CLOTH 2 % EX PADS
6.0000 | MEDICATED_PAD | Freq: Every day | CUTANEOUS | Status: DC
  Administered 2017-05-20 – 2017-06-09 (×20): 6 via TOPICAL

## 2017-05-20 MED ORDER — ORAL CARE MOUTH RINSE
15.0000 mL | Freq: Two times a day (BID) | OROMUCOSAL | Status: DC
  Administered 2017-05-21: 15 mL via OROMUCOSAL

## 2017-05-20 MED ORDER — SODIUM CHLORIDE 0.9% FLUSH
10.0000 mL | INTRAVENOUS | Status: DC | PRN
Start: 2017-05-20 — End: 2017-06-15
  Administered 2017-05-24 – 2017-06-03 (×4): 10 mL
  Filled 2017-05-20 (×4): qty 40

## 2017-05-20 MED ORDER — ACETAMINOPHEN 325 MG PO TABS: 650.0000 mg | ORAL_TABLET | Freq: Four times a day (QID) | ORAL | Status: DC

## 2017-05-20 MED ORDER — TRAVASOL 10 % IV SOLN
INTRAVENOUS | Status: AC
  Administered 2017-05-20: 17:00:00 via INTRAVENOUS
  Filled 2017-05-20: qty 439.2

## 2017-05-20 MED ORDER — MAGNESIUM SULFATE 2 GM/50ML IV SOLN
2.0000 g | Freq: Once | INTRAVENOUS | Status: AC
  Administered 2017-05-20: 2 g via INTRAVENOUS
  Filled 2017-05-20: qty 50

## 2017-05-20 MED ORDER — HALOPERIDOL LACTATE 5 MG/ML IJ SOLN
INTRAMUSCULAR | Status: AC
  Filled 2017-05-20: qty 1

## 2017-05-20 MED ORDER — DEXTROSE 10 % IV SOLN
INTRAVENOUS | Status: AC
  Administered 2017-05-20 – 2017-05-21 (×2): via INTRAVENOUS

## 2017-05-20 MED ORDER — HYDROMORPHONE HCL 1 MG/ML IJ SOLN
0.5000 mg | INTRAMUSCULAR | Status: DC | PRN
  Administered 2017-05-21 – 2017-05-22 (×4): 0.5 mg via INTRAVENOUS
  Administered 2017-05-22: 1 mg via INTRAVENOUS
  Administered 2017-05-22 (×2): 0.5 mg via INTRAVENOUS
  Administered 2017-05-22: 1 mg via INTRAVENOUS
  Administered 2017-05-22 (×2): 0.5 mg via INTRAVENOUS
  Administered 2017-05-23 – 2017-05-25 (×16): 1 mg via INTRAVENOUS
  Administered 2017-05-25: 0.5 mg via INTRAVENOUS
  Administered 2017-05-25 – 2017-05-28 (×5): 1 mg via INTRAVENOUS
  Administered 2017-05-30: 0.5 mg via INTRAVENOUS
  Filled 2017-05-20 (×4): qty 1
  Filled 2017-05-20: qty 0.5
  Filled 2017-05-20: qty 1
  Filled 2017-05-20 (×2): qty 0.5
  Filled 2017-05-20 (×6): qty 1
  Filled 2017-05-20: qty 0.5
  Filled 2017-05-20 (×3): qty 1
  Filled 2017-05-20: qty 0.5
  Filled 2017-05-20 (×2): qty 1
  Filled 2017-05-20: qty 0.5
  Filled 2017-05-20 (×3): qty 1
  Filled 2017-05-20: qty 0.5
  Filled 2017-05-20 (×6): qty 1
  Filled 2017-05-20: qty 0.5

## 2017-05-20 MED ORDER — METOPROLOL TARTRATE 5 MG/5ML IV SOLN
INTRAVENOUS | Status: AC
  Administered 2017-05-20: 2.5 mg via INTRAVENOUS
  Filled 2017-05-20: qty 5

## 2017-05-20 MED ORDER — VANCOMYCIN HCL 10 G IV SOLR
1250.0000 mg | INTRAVENOUS | Status: DC
  Administered 2017-05-20 – 2017-05-26 (×7): 1250 mg via INTRAVENOUS
  Filled 2017-05-20 (×7): qty 1250

## 2017-05-20 MED ORDER — PIPERACILLIN-TAZOBACTAM 3.375 G IVPB
3.3750 g | Freq: Three times a day (TID) | INTRAVENOUS | Status: DC
  Administered 2017-05-20 – 2017-06-21 (×96): 3.375 g via INTRAVENOUS
  Filled 2017-05-20 (×101): qty 50

## 2017-05-20 MED ORDER — POTASSIUM PHOSPHATES 15 MMOLE/5ML IV SOLN
15.0000 mmol | Freq: Once | INTRAVENOUS | Status: AC
  Administered 2017-05-20: 15 mmol via INTRAVENOUS
  Filled 2017-05-20: qty 5

## 2017-05-20 NOTE — Progress Notes (Signed)
Pharmacy Antibiotic Note  Jesse Le is a 72 y.o. male admitted on 05/06/2017 with sepsis. Now being treated for intra-abdominal infection.  Pharmacy has been consulted for Vancomycin, Zosyn, Eraxis dosing. Previously patient was on Vanc and Zosyn that was switched to Unasyn. Due to infected drain, the medical team wants better gram negative coverage so change Unasyn back to Zosyn.  Afebrile, WBC normalized, renal function variable, and weight has increased overnight.   Plan:  Vancomycin 1500 mg q24h (goal 15-20) Vanc trough prior to next dose (around 2130) Restart Zosyn 3.375 g q8h. D/C unasyn Continue eraxis 100 mg q24h F/u ID recommendations.  Monitor clinic progress, WBC, TMax, renal function, electrolytes. F/u future de-escalation and length of therapy.   Height: 6' 0.99" (185.4 cm) Weight: 252 lb 10.4 oz (114.6 kg) IBW/kg (Calculated) : 79.88  Temp (24hrs), Avg:97.9 F (36.6 C), Min:97 F (36.1 C), Max:98.7 F (37.1 C)  Recent Labs  Lab 05/18/17 0223 05/18/17 1348 05/18/17 2119 05/18/17 2133 05/19/17 0050 05/19/17 0310 05/19/17 1731 05/20/17 0348  WBC 14.9* 15.7* 14.1*  --   --  14.6*  --  7.7  CREATININE 1.83* 1.80* 1.79*  --   --  1.71* 1.52* 1.77*  LATICACIDVEN  --   --   --  3.4* 1.7  --   --   --     Estimated Creatinine Clearance: 50.1 mL/min (A) (by C-G formula based on SCr of 1.77 mg/dL (H)).    No Known Allergies  Antimicrobials this admission: Zosyn 5/7  >> 5/8; 5/10 >> Vanc 5/9 >>  Unasyn 5/9 >> 5/10 Eraxis 5/9 >>  Dose adjustments this admission: None  Microbiology results: 5/8 BCx: Ngtd 5/8 MRSA PCR: Neg  Thank you for allowing pharmacy to be a part of this patient's care.  Almira Bar 05/20/2017 10:22 AM

## 2017-05-20 NOTE — Progress Notes (Addendum)
PULMONARY / CRITICAL CARE MEDICINE   Name: Jesse Le MRN: [id number] DOB: 13-Jan-1945    ADMISSION DATE:  05/06/2017 CONSULTATION DATE:  05/18/2017  REFERRING MD:  Dr. Terrence Dupont  CHIEF COMPLAINT:  Hypotension  HISTORY OF PRESENT ILLNESS:   72 yo male presented with abdominal pain, weight loss and intermittent hematochezia.  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  Had low anterior resection with primary anastomosis with diverting loop ileostomy.  Developed hypotension, hypoglycemia 5/07.  Developed hypotension and altered mental status 5/08 and transferred to ICU. Pt has an anastomotic leak, drain placed by surgery PMHx of CAD, DM, TAA.  SUBJECTIVE:  C/o abdominal discomfort when moving.  Breathing okay on RA.  Denies chest pain. Only complaint is abdominal tightness. Blood pressure is soft and on HCO3 gtt.No pressors yet. Hypoglycemia overnight  VITAL SIGNS: BP (!) 132/51 (BP Location: Right Arm)   Pulse (!) 113   Temp 97.6 F (36.4 C) (Axillary)   Resp 14   Ht 6' 0.99" (1.854 m)   Wt 252 lb 10.4 oz (114.6 kg)   SpO2 99%   BMI 33.34 kg/m   INTAKE / OUTPUT: I/O last 3 completed shifts: In: 8894.2 [P.O.:180; I.V.:6624.2; IV Piggyback:2090] Out: 7209 [OBSJG:2836; Drains:430; Stool:3150]  PHYSICAL EXAMINATION:  General - Awake and following commands Eyes - PERRLA ENT - Few teeth, poor dentition Cardiac - S1, S2, RRR, No RMG Chest - Bilateral chest excursion, Clear with few rhonchi, diminished per bases Abd - ileostomy with scant yellow stool,  and JP drain with thin  feculent material Ext - 1-2 + edema, no obvious deformities Skin - no rashes Neuro - Follows simple commands, MAE x 4, Alert to self and place   LABS:  BMET Recent Labs  Lab 05/19/17 0310 05/19/17 1731 05/20/17 0348  NA 132* 131* 132*  K 3.8 3.8 3.6  CL 110 104 101  CO2 17* 19* 19*  BUN 11 10 11   CREATININE 1.71* 1.52* 1.77*  GLUCOSE 93 278* 115*     Electrolytes Recent Labs  Lab 05/16/17 0230  05/19/17 0310 05/19/17 1731 05/20/17 0348  CALCIUM 8.1*   < > 7.4* 7.4* 7.4*  MG 1.8  --   --   --  1.2*  PHOS  --   --   --   --  2.8   < > = values in this interval not displayed.    CBC Recent Labs  Lab 05/18/17 2119 05/19/17 0310 05/20/17 0348  WBC 14.1* 14.6* 7.7  HGB 9.7* 9.0* 10.8*  HCT 32.7* 30.2* 34.8*  PLT 199 224 276    Coag's Recent Labs  Lab 05/19/17 0050  INR 1.60    Sepsis Markers Recent Labs  Lab 05/18/17 2133 05/19/17 0050  LATICACIDVEN 3.4* 1.7    ABG Recent Labs  Lab 05/18/17 1258 05/19/17 1615 05/20/17 0432  PHART 7.287* 7.371 7.440  PCO2ART 24.1* 29.4* 27.5*  PO2ART 80.7* 79.1* 76.1*    Liver Enzymes Recent Labs  Lab 05/20/17 0348  AST 85*  ALT 50  ALKPHOS 76  BILITOT 1.1  ALBUMIN 1.2*    Cardiac Enzymes No results for input(s): TROPONINI, PROBNP in the last 168 hours.  Glucose Recent Labs  Lab 05/19/17 0813 05/19/17 1201 05/19/17 1556 05/19/17 2039 05/20/17 0436 05/20/17 0731  GLUCAP 165* 217* 228* 242* 153* 77    Imaging [place] Ekg Site Rite  Result Date: 05/19/2017 If Site Rite image not attached, placement could not be confirmed due to  current cardiac rhythm.    STUDIES:  CT abd 4/26 >> 9 cm mass CT abd 5/09 >> discontinuity in the posterior wall just above the anastomosis with feculent appearing material and gas in the pelvis, measuring up to 5x 3 cm anterior and superior to the sigmoid colon.  Fluid collection right lower quadrant measuring up to 6.4 x 3.5 cm. Moderate pneumoperitoneum.  CULTURES: Blood culture 5/8 >  ANTIBIOTICS: Zosyn 5/7 > 5/8 Zosyn 5/10>> Unasyn 5/8 > 5/10 Vancomycin 5/7 > Eraxis 5/8 >  SIGNIFICANT EVENTS: 4/26 admit for abdominal pain. Mass identified on CT 4/28 colonoscopy. Biopsy sent.  5/1 bx grows adeno. To OR for resection ileostomy 5/8 hypotension hypoglycemia AMS, transfer to ICU  LINES/TUBES: Lt IJ CVL  5/8  > JP Drain 5/1>  DISCUSSION: 72 yo male with sepsis from peritonitis after surgery for colon cancer and CT abd +  for anastomotic leak.>> Drain placed 5/1  ASSESSMENT / PLAN:  Sepsis from peritonitis. BP soft over night CVP is 5>> Intravascularly dry Decreased urine output Colon cancer s/p low anterior resection with ileostomy. - continue ABX>> Will resume Zosyn for better gram - coverage in setting of abdominal sepsis - surgery following>> May need to be taken back to OR - continue IV fluids>> requiring bolus x 2 5/10 am - Maintenance IVF @ 125 - Maintain MAP > 65  Acute renal failure from ATN. Non anion gap metabolic acidosis. Hypomag Hypo phos Slight increase in creat. - Discontinue HCO3 gtt - f/u BMET, ABG - Replete Mag, Phos and K on 5/10  Tachycardia/ Hx. CAD - Contributing to soft BP - Lopressor per cards - Fluid bolus as above - Hold ASA while NPO  Anemia of critical illness. - f/u CBC - Monitor for active bleeding  DM type II. Hypoglycemia in setting of sepsis. - CBG's Q 4 - Start TNA  asap  Acute metabolic encephalopathy 2nd to sepsis. - monitor neuro status - Frequent re-orientation  Relative adrenal insufficiency. - Taper  off solucortef  DVT prophylaxis - lovenox SUP - pepcic Nutrition - NPO Goals of care - full code  CC time 33 minutes  Magdalen Spatz, AGACNP-BC [place] 05/20/2017, 9:12 AM

## 2017-05-20 NOTE — Progress Notes (Signed)
Dr. Terrence Dupont ordered to keep CBGs on the higher side of normal until TNP is started.  1200 CBG check was 85.  82ml dextrose given to prevent critical low like previously experienced this morning.  Will recheck soon, and continue to monitor closely and update as needed.

## 2017-05-20 NOTE — Progress Notes (Signed)
Subjective:  Patient lethargic tachycardic noted to have blood sugar and 20s received 2 ampules of of D50 with increase in blood sugar and 170s and improvement in mentation. Given 1 more bolus of 500 normal saline and 2.5 mg of IV Lopressor with improvement in heart rate in 80s states feeling better now denies any chest pain or abdominal pain.remains afebrile white count trending down  Objective:  Vital Signs in the last 24 hours: Temp:  [97 F (36.1 C)-98.7 F (37.1 C)] 97.6 F (36.4 C) (05/10 0452) Pulse Rate:  [31-113] 113 (05/10 0830) Resp:  [14-30] 14 (05/10 0830) BP: (85-148)/(48-131) 132/51 (05/10 0830) SpO2:  [86 %-100 %] 99 % (05/10 0830) Weight:  [106.9 kg (235 lb 10.8 oz)-114.6 kg (252 lb 10.4 oz)] 114.6 kg (252 lb 10.4 oz) (05/10 0700)  Intake/Output from previous day: 05/09 0701 - 05/10 0700 In: 5859.2 [P.O.:180; I.V.:4449.2; IV Piggyback:1230] Out: 3419 [Urine:1845; Drains:360; Stool:950] Intake/Output from this shift: No intake/output data recorded.  Physical Exam: Neck: no adenopathy, no carotid bruit, no JVD and supple, symmetrical, trachea midline Lungs: clear to auscultation bilaterally Heart: regular rate and rhythm, S1, S2 normal and Soft systolic murmur noted Abdomen: Mildly distended bowel sounds present ileostomy site looks okay purulent material in JP catheter Extremities: extremities normal, atraumatic, no cyanosis or edema and No evidence of DVT  Lab Results: Recent Labs    05/19/17 0310 05/20/17 0348  WBC 14.6* 7.7  HGB 9.0* 10.8*  PLT 224 276   Recent Labs    05/19/17 1731 05/20/17 0348  NA 131* 132*  K 3.8 3.6  CL 104 101  CO2 19* 19*  GLUCOSE 278* 115*  BUN 10 11  CREATININE 1.52* 1.77*   No results for input(s): TROPONINI in the last 72 hours.  Invalid input(s): CK, MB Hepatic Function Panel Recent Labs    05/20/17 0348  PROT 4.8*  ALBUMIN 1.2*  AST 85*  ALT 50  ALKPHOS 76  BILITOT 1.1   No results for input(s): CHOL in  the last 72 hours. No results for input(s): PROTIME in the last 72 hours.  Imaging: Imaging results have been reviewed and Ct Abdomen Pelvis Wo Contrast  Result Date: 05/19/2017 CLINICAL DATA:  Uncontrolled fistula. EXAM: CT ABDOMEN AND PELVIS WITHOUT CONTRAST TECHNIQUE: Multidetector CT imaging of the abdomen and pelvis was performed following the standard protocol without IV contrast. COMPARISON:  12/26/2012 FINDINGS: Lower chest: Stented thoracic aortic aneurysm. Lower lobe atelectasis. Hepatobiliary: No focal liver abnormality.Cholelithiasis. No signs of cholecystitis. Pancreas: Unremarkable. Spleen: Unremarkable. Adrenals/Urinary Tract: Negative adrenals. Severe left renal atrophy, likely from old vascular injury. Left renal cystic density. Right renal isodense but stable from 2014 lesion compatible with cyst. The bladder is decompressed by Foley catheter. Stomach/Bowel: Status post low anterior resection. There is a visible discontinuity in the posterior wall just above the anastomosis with feculent appearing material and gas in the pelvis, measuring up to 5x 3 cm anterior and superior to the sigmoid colon. Oral contrast has reached the proximal small bowel only. Mild generalized fluid filling of small bowel. No obstructive process is seen. Negative appendix. Stranding around the ascending colon is likely postoperative. Fluid collection right lower quadrant measuring up to 6.4 x 3.5 cm. Moderate pneumoperitoneum. Vascular/Lymphatic: Aortoiliac stent grafting with multiple visceral stents. No mass or adenopathy. Reproductive:Negative Other: Low abdominal wall incision is open and packed. No undermining or collection. Musculoskeletal: Extensive degenerative changes. Multilevel bridging osteophyte. No acute osseous finding. A call is being placed to the ordering  provider. IMPRESSION: 1. Visible 6 mm defect in the posterior colon wall just above the colocolonic anastomosis with feculent material tracking  superiorly and anteriorly. The largest gas and feculent collection measures 5 x 3 cm superior to the sigmoid colon. The surgical drain is in continuity with the collection. 2. Likely contiguous 6 x 4 cm fluid collection in the right lower quadrant with mesenteric fat edema. 3. Moderate pneumoperitoneum. 4. Oral contrast only reached the proximal jejunum. Probable mild ileus. 5. Open low abdominal wall with packing. No undermining or subcutaneous collection. 6. Cholelithiasis Electronically Signed   By: Monte Fantasia M.D.   On: 05/19/2017 07:12   Dg Chest Port 1 View  Result Date: 05/19/2017 CLINICAL DATA:  72 year old male with left-sided line placement. EXAM: PORTABLE CHEST 1 VIEW COMPARISON:  Chest CT dated 05/06/2017 FINDINGS: Left IJ central line with tip over mid SVC. No pneumothorax. Left lung base atelectatic changes versus infiltrate. The right lung is clear. No pleural effusion or pneumothorax. An endovascular stent graft repair of the aorta noted. IMPRESSION: 1. Left IJ central line with tip over mid SVC.  No pneumothorax. 2. Left lung base atelectasis/infiltrate. Electronically Signed   By: Anner Crete M.D.   On: 05/19/2017 00:13   [place] Ekg Site Rite  Result Date: 05/19/2017 If Site Rite image not attached, placement could not be confirmed due to current cardiac rhythm.   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 Acute on chronic renal injury secondary to hypotension/sepsis. Recurrent hypoglycemia Status postAcute on chronic hypochromic microcytic anemia History of thoracic aneurysm dissection status post endograftandinfrarenal dissection status post fenestrated endovascular graft Hypertension Diabetes mellitus COPD Obesity Plan May repeat  one more bolus of normal saline if no urine output. IV fluid normal saline 150 per hour until TPN is started Monitor blood sugar every hour keep blood sugar above 100 Check labs in a.m.   LOS: 14 days    Charolette Forward 05/20/2017, 9:12 AM

## 2017-05-20 NOTE — Progress Notes (Addendum)
PHARMACY - ADULT TOTAL PARENTERAL NUTRITION CONSULT NOTE   Pharmacy Consult:  TPN electrolytes follow-up  Phos 7.4 Mag 1.6  Labs were obtained while KPhos was infusing; therefore, elevated phos level this afternoon is likely inaccurate.    Plan: RN will redraw labs around 1800   Mykenna Viele D. Mina Marble, PharmD, BCPS, [place] Pager:  [phone] 05/20/2017, 5:13 PM   ================================   Addendum: Phos 3.4 Mag 1.8   Plan: Mag sulfate 2gm IV x 1 F/U AM labs    Jenicka Coxe D. Mina Marble, PharmD, BCPS, [place] Pager:  [phone] 05/20/2017, 7:53 PM

## 2017-05-20 NOTE — Progress Notes (Signed)
OT Cancellation Note  Patient Details Name: Jesse Le MRN: [id number] DOB: 12-Sep-1945   Cancelled Treatment:    Reason Eval/Treat Not Completed: Medical issues which prohibited therapy. Received text from PT this AM that RN asked to hold tx for now as patient has had low BP, sugars, and has been tachycardic. Will cont to follow and re-attempt as appropriate.     Almon Register [phone] 05/20/2017, 11:28 AM

## 2017-05-20 NOTE — Progress Notes (Signed)
PT Cancellation Note  Patient Details Name: Jesse Le MRN: [id number] DOB: Apr 17, 1945   Cancelled Treatment:    Reason Eval/Treat Not Completed: Medical issues which prohibited therapy Attempted to work with patient at 0900, PT asked to hold per RN as patient has had low BP, sugars, and has been tachycardic. Will cont to follow and re-attempt as appropriate.   Reinaldo Berber, PT, DPT Acute Rehab Services Pager: [phone]     Reinaldo Berber 05/20/2017, 9:00 AM

## 2017-05-20 NOTE — Progress Notes (Signed)
Nutrition Follow-up  DOCUMENTATION CODES:   Not applicable  INTERVENTION:  Initiate TPN Pt needs: Kcal:  2100-2400 Protein:  105-120 grams Fluid:  2.1 - 2.4 L/day  Pt high refeeding risk. (pt NPO/clear liquids >10 days) Monitor phosphorus, potassium, magnesium daily.   NUTRITION DIAGNOSIS:   Increased nutrient needs related to (cancer) as evidenced by estimated needs.  Ongoing  GOAL:   Patient will meet greater than or equal to 90% of their needs  Ongoing - progressing  MONITOR:   PO intake, Supplement acceptance, Diet advancement, Weight trends, Labs, Skin, I & O's  REASON FOR ASSESSMENT:   Consult Diet education, Calorie Count  ASSESSMENT:   72 year old male here with iron deficiency anemia with recent rectal bleeding found to have abnormal CT scan of the abdomen and pelvis suggestive of a sigmoid cancer.  He has a history of peripheral vascular disease, aortic aneurysm with endovascular repair, CAD, hypertension, hyperlipidemia, COPD, CKD.  TPN initiated 05/20/17 am. TPN lipids currently held per ASPEN guidelines for critically-ill patients. Spoke with TPN pharmacist via phone recommending adding lipids to TPN as pt at high risk for essential fatty acid deficiency (pt NPO/clear liquids >10 days); pharmacist to add lipids 5/11.   TPN ordered without lipids currently at 30 ml/hr providing 43.55 g of protein, 162 g of dextrose, and 0 g of lipids which provides 726 kCals per day, meeting 35% of patient needs - not at goal rate yet due to pt refeeding risk. MVI and trace elements added to TPN.  Pt unresponsive during dietetic intern visit, pt opened eyes, but would not respond to questions and seemed drowsy. Asked RN if this was normal behavior, RN reports he is typically responsive and will ask for things, but has been in and out of sleep. Rn reports PICC line placed today and TPN will be initiated tonight.    Medications reviewed: solu-cortef, novolog 0-9 units, eraxis,  D50 25 ml, zosyn, potassium phosphate, vancocin.  Labs reviewed: Na 132 (L), CO2 19 (L), BG 115 (H), creatinine 1.77 (H), magnesium 1.2 (L), AST 85 (H), Adjusted Ca 9.64 (WNL), albumin 1.2 (L), GFR 37 (L), triglycerides 56 (WNL), hemoglobin 10.8 (L), HCT 34.8 (L).   Diet Order:   Diet Order           Diet NPO time specified  Diet effective now          EDUCATION NEEDS:   Not appropriate for education at this time  Skin:  Skin Assessment: Skin Integrity Issues: Skin Integrity Issues:: Incisions Incisions: closed abdomen  Last BM:  5/5 - ileostomy   Height:   Ht Readings from Last 1 Encounters:  05/11/17 6' 0.99" (1.854 m)    Weight:   Wt Readings from Last 1 Encounters:  05/20/17 252 lb 10.4 oz (114.6 kg)    Ideal Body Weight:  83.6 kg  BMI:  Body mass index is 33.34 kg/m.  Estimated Nutritional Needs:   Kcal:  2100-2400  Protein:  105-120 grams  Fluid:  2.1 - 2.4 L/day    Hope Budds, Dietetic Intern

## 2017-05-20 NOTE — Progress Notes (Signed)
[facility] CONSULT NOTE   Pharmacy Consult for TPN Indication: Prolonged ileus  Patient Measurements: Height: 6' 0.99" (185.4 cm) Weight: 252 lb 10.4 oz (114.6 kg) IBW/kg (Calculated) : 79.88 TPN AdjBW (KG): 94.5 Body mass index is 33.34 kg/m.  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  Developed anastomotic leak with feculent output in JP drain. Patient currently with high ileostomy output. Patient's abdomen has become more distended and patient was made NPO. Pharmacy consulted for TPN.   Patient was on clear liquids (on and off NPO as well) but only tolerating minimal intake (<25%) for >10 days. Patient is at risk for refeeding.   GI: Ileostomy 950 cc out/24hrs. Output becoming more thin and clear. Pre-albumin <5. Pepcid IV. Awaiting oncology input. Possible return to OR for washout of abdomen and further drain placement.  Endo: CBGs 77 to 115. Stress steroids tapering. D50 given this AM x1. Insulin requirements in the past 24 hours: 13 units SSI Lytes: Na low at 132. K 3.6. Phos 2.8. Mag low at 1.2 (4g given), CoCa 9.6 Renal: SCr 1.77 (stable), however UOP significantly decreased last 24 hrs. BUN 11. Sodium bicarbonate at 125 mL/hr. Giving fluid boluses to help with UOP today. Pulm: RA Cards: BP elevated this AM. Tachy. CVP 5 prior to boluses.   Hepatobil: AST up at 85, ALT/AlkPhos/Tbili/TG wnl.  Neuro: Prn Hydromorphone, Oxycodone. RASS -1.  ID: Unasyn, Vancomycin, and Eraxis empiric with abdominal wound infection. Necrosis decreased per surgery notes. WBC down to wnl. Afebrile.   TPN Access: CVC Triple lumen (placed 5/8) TPN start date: 05/20/17 Nutritional Goals (per RD  recommendation on 5/9): KCal: 2100- 2400  Protein: 105-120g Fluid: 2.1- 2.4 L/day  Goal TPN rate is 80 ml/hr (provides 100% of needs)  Current Nutrition:  NPO TPN  Plan:  Initiate TPN at 30 mL/hr - high risk for refeeding. Hold 20% lipid emulsion for first 7 days for ICU patients per ASPEN guidelines (Start date 05/26/17) This TPN provides 43.55 g of protein, 162 g of dextrose, and 0 g of lipids which provides 726 kCals per day, meeting 35% of patient needs (going slow due to refeeding risk) Electrolytes in TPN: keeping standard and supplementing outside of TPN today Reduce NS to 120 mL/hr when TPN starts at 1800 Add MVI, trace elements to TPN Continue sensitive SSI and adjust as needed Change Pepcid to 40 mg and add in TPN KPhos 15 mmol x1 KCl 10 mEq IV x2 runs F/U repeat Mg and Phos this PM   Sloan Leiter, PharmD, BCPS, BCCCP Clinical Pharmacist Clinical phone 05/20/2017 until 3:30PM - #[phone] After hours, please call #[phone] 05/20/2017,8:31 AM

## 2017-05-20 NOTE — Progress Notes (Addendum)
Patient ID: Jesse Le, male   DOB: Jul 03, 1945, 72 y.o.   MRN: [id number]    9 Days Post-Op  Subjective: Pt is more restless this morning.  Complaining of abdominal pain some.  RN states UOP dropped overnight significantly.  Only had 95cc in bladder on bladder scan.  Gave 250cc bolus overnight.    Objective: Vital signs in last 24 hours: Temp:  [97 F (36.1 C)-98.7 F (37.1 C)] 97.6 F (36.4 C) (05/10 0452) Pulse Rate:  [31-102] 62 (05/10 0700) Resp:  [14-30] 19 (05/10 0700) BP: (85-148)/(48-131) 148/131 (05/10 0700) SpO2:  [86 %-100 %] 86 % (05/10 0700) Weight:  [106.9 kg (235 lb 10.8 oz)-114.6 kg (252 lb 10.4 oz)] 114.6 kg (252 lb 10.4 oz) (05/10 0700) Last BM Date: 05/19/17(continual drainage)  Intake/Output from previous day: 05/09 0701 - 05/10 0700 In: 5859.2 [P.O.:180; I.V.:4449.2; IV Piggyback:1230] Out: 3810 [Urine:1845; Drains:360; Stool:950] Intake/Output this shift: No intake/output data recorded.  PE: Gen: appears more restless at times this morning Heart: tachy this morning.  Somewhat irregular, but has a p-wave on monitor Lungs: CTAB Abd: soft, but about as distended as yesterday afternoon.  Few BS, still tender diffusely, guards a little but no definite peritoneal signs at this time.  LLQ JP drain with more thing serous brown fluid today.  Ileostomy with 950cc documented in last 24hrs.  Midline wound is stable.  Base is beginning to look better, but still with some necrosis  Lab Results:  Recent Labs    05/19/17 0310 05/20/17 0348  WBC 14.6* 7.7  HGB 9.0* 10.8*  HCT 30.2* 34.8*  PLT 224 276   BMET Recent Labs    05/19/17 1731 05/20/17 0348  NA 131* 132*  K 3.8 3.6  CL 104 101  CO2 19* 19*  GLUCOSE 278* 115*  BUN 10 11  CREATININE 1.52* 1.77*  CALCIUM 7.4* 7.4*   PT/INR Recent Labs    05/19/17 0050  LABPROT 18.9*  INR 1.60   CMP     Component Value Date/Time   NA 132 (L) 05/20/2017 0348   K 3.6 05/20/2017 0348   CL 101 05/20/2017 0348    CO2 19 (L) 05/20/2017 0348   GLUCOSE 115 (H) 05/20/2017 0348   BUN 11 05/20/2017 0348   CREATININE 1.77 (H) 05/20/2017 0348   CALCIUM 7.4 (L) 05/20/2017 0348   PROT 4.8 (L) 05/20/2017 0348   ALBUMIN 1.2 (L) 05/20/2017 0348   AST 85 (H) 05/20/2017 0348   ALT 50 05/20/2017 0348   ALKPHOS 76 05/20/2017 0348   BILITOT 1.1 05/20/2017 0348   GFRNONAA 37 (L) 05/20/2017 0348   GFRAA 42 (L) 05/20/2017 0348   Lipase     Component Value Date/Time   LIPASE 22 05/06/2017 1036       Studies/Results: Ct Abdomen Pelvis Wo Contrast  Result Date: 05/19/2017 CLINICAL DATA:  Uncontrolled fistula. EXAM: CT ABDOMEN AND PELVIS WITHOUT CONTRAST TECHNIQUE: Multidetector CT imaging of the abdomen and pelvis was performed following the standard protocol without IV contrast. COMPARISON:  12/26/2012 FINDINGS: Lower chest: Stented thoracic aortic aneurysm. Lower lobe atelectasis. Hepatobiliary: No focal liver abnormality.Cholelithiasis. No signs of cholecystitis. Pancreas: Unremarkable. Spleen: Unremarkable. Adrenals/Urinary Tract: Negative adrenals. Severe left renal atrophy, likely from old vascular injury. Left renal cystic density. Right renal isodense but stable from 2014 lesion compatible with cyst. The bladder is decompressed by Foley catheter. Stomach/Bowel: Status post low anterior resection. There is a visible discontinuity in the posterior wall just above the anastomosis with feculent appearing  material and gas in the pelvis, measuring up to 5x 3 cm anterior and superior to the sigmoid colon. Oral contrast has reached the proximal small bowel only. Mild generalized fluid filling of small bowel. No obstructive process is seen. Negative appendix. Stranding around the ascending colon is likely postoperative. Fluid collection right lower quadrant measuring up to 6.4 x 3.5 cm. Moderate pneumoperitoneum. Vascular/Lymphatic: Aortoiliac stent grafting with multiple visceral stents. No mass or adenopathy.  Reproductive:Negative Other: Low abdominal wall incision is open and packed. No undermining or collection. Musculoskeletal: Extensive degenerative changes. Multilevel bridging osteophyte. No acute osseous finding. A call is being placed to the ordering provider. IMPRESSION: 1. Visible 6 mm defect in the posterior colon wall just above the colocolonic anastomosis with feculent material tracking superiorly and anteriorly. The largest gas and feculent collection measures 5 x 3 cm superior to the sigmoid colon. The surgical drain is in continuity with the collection. 2. Likely contiguous 6 x 4 cm fluid collection in the right lower quadrant with mesenteric fat edema. 3. Moderate pneumoperitoneum. 4. Oral contrast only reached the proximal jejunum. Probable mild ileus. 5. Open low abdominal wall with packing. No undermining or subcutaneous collection. 6. Cholelithiasis Electronically Signed   By: Monte Fantasia M.D.   On: 05/19/2017 07:12   Dg Chest Port 1 View  Result Date: 05/19/2017 CLINICAL DATA:  72 year old male with left-sided line placement. EXAM: PORTABLE CHEST 1 VIEW COMPARISON:  Chest CT dated 05/06/2017 FINDINGS: Left IJ central line with tip over mid SVC. No pneumothorax. Left lung base atelectatic changes versus infiltrate. The right lung is clear. No pleural effusion or pneumothorax. An endovascular stent graft repair of the aorta noted. IMPRESSION: 1. Left IJ central line with tip over mid SVC.  No pneumothorax. 2. Left lung base atelectasis/infiltrate. Electronically Signed   By: Anner Crete M.D.   On: 05/19/2017 00:13   [place] Ekg Site Rite  Result Date: 05/19/2017 If Site Rite image not attached, placement could not be confirmed due to current cardiac rhythm.   Anti-infectives: Anti-infectives (From admission, onward)   Start     Dose/Rate Route Frequency Ordered Stop   05/20/17 0500  anidulafungin (ERAXIS) 100 mg in sodium chloride 0.9 % 100 mL IVPB     100 mg 78 mL/hr over 100 Minutes  Intravenous Every 24 hours 05/19/17 0116     05/19/17 2200  vancomycin (VANCOCIN) 1,500 mg in sodium chloride 0.9 % 500 mL IVPB     1,500 mg 250 mL/hr over 120 Minutes Intravenous Every 24 hours 05/18/17 2126     05/19/17 0130  anidulafungin (ERAXIS) 200 mg in sodium chloride 0.9 % 200 mL IVPB     200 mg 78 mL/hr over 200 Minutes Intravenous  Once 05/19/17 0116 05/19/17 0520   05/19/17 0130  Ampicillin-Sulbactam (UNASYN) 3 g in sodium chloride 0.9 % 100 mL IVPB     3 g 200 mL/hr over 30 Minutes Intravenous Every 8 hours 05/19/17 0118     05/18/17 2200  vancomycin (VANCOCIN) 1,750 mg in sodium chloride 0.9 % 500 mL IVPB     1,750 mg 250 mL/hr over 120 Minutes Intravenous  Once 05/18/17 2126 05/19/17 0400   05/17/17 0930  piperacillin-tazobactam (ZOSYN) IVPB 3.375 g  Status:  Discontinued     3.375 g 12.5 mL/hr over 240 Minutes Intravenous Every 8 hours 05/17/17 0836 05/19/17 0118   05/11/17 2200  cefoTEtan (CEFOTAN) 2 g in sodium chloride 0.9 % 100 mL IVPB     2  g 200 mL/hr over 30 Minutes Intravenous Every 12 hours 05/11/17 1839 05/11/17 2034   05/11/17 0600  cefoTEtan in Dextrose 5% (CEFOTAN) IVPB 2 g     2 g Intravenous On call to O.R. 05/10/17 1142 05/11/17 1416       Assessment/Plan Coronary artery disease. History of myocardial infarction Hypertension Status post thoracic aortic dissection and stent placement  status post bilateral iliac stent placements COPD AKI/CKD II- creatinine up from 1.5 to 1.7.  UOP dropped overnight from 200cc an hr yesterday afternoon to 5-10cc/hr overnight.   NIDDM- hypoglycemia resolved.  Chronic anemia- hgb10.8 Obesity Hypokalemia -resolved Hypotension - patient's CVP is 5.  Still dehydrated some, but more fluid balanced than a couple of days ago.  With new hypotension again this morning along with tachycardia and decrease UOP.  Maintenance fluids DC overnight so will give a 500cc bolus.  Still has bicarb drip at 125cc/hr.  Awaiting TNA to  be started. Hypomagnesemia - 1.2 today, will replace  Reducible umbilical hernia Rectosigmoid colon mass- S/p LAR with primary colorectal (EE)anastomosis and diverting loop ileostomy with umbilical hernia repair 08/20/14 Dr. Georgette Dover -Pathology shows adenoCA, 0/15 LNs - POD#9 -Wound infection opened up on POD 5, now with NS WD dressing changes BID or prn saturation from ileostomy leakage.Base of wound is necrotic, but fascia is still intact, improving.  -anastomotic leak.  Output less feculent and colon has likely finished draining remaining stool.  Becoming more thin and clear. -we had him on Zosyn, but this was switched overnight to unasyn, Vanc, and antifungal cover.  Unasyn's antibiogram does not have as good of gram - coverage as zosyn and question this change.  -having high ileostomy output improving maxed out on imodium, lomotil, and fiber.  Will follow.  Do not want to slow his gut down, but need to keep this output controlled in order minimized dehydration as well electrolyte balance  -needs an oncology consult at some point  -given changes clinically today, despite normalized WBC and AF, we will follow the patient very closely today as he may have to return to the OR for a washout of his abdomen and more drain placements.  Will evaluate later after bolus has run to see how he is doing.  Severe protein calorie malnutrition -TNA -prealbumin <5 and albumin less than 1.2.   FEN:NPO/TNA/IVFs VTE: SCDs, lovenox XI:HWTUU 5/7 -->5/9  Unasyn 5/9-->  Vanc 5/9-->  eraxis 5/9-->    LOS: 14 days    [name] , [facility] 05/20/2017, 7:59 AM Pager: [phone]

## 2017-05-20 NOTE — Progress Notes (Signed)
Pharmacy Antibiotic Note  Jesse Le is a 72 y.o. male admitted on 05/06/2017 with abdominal pain associated with poor appetite and weight loss.  Pharmacy has been consulted for vancomycin dosing for wound infection; also on Unasyn. He is s/p LAR with primary colorectalanastomosis and diverting loop ileostomy with umbilical hernia repair on 05/11/17.  Patient has AKI and vancomycin level (not steady-state) is 15 mcg/mL.  He is at risk for accumulation; therefore, will reduce vancomycin dose.   Plan: Change vanc to 1250mg  IV Q24H Monitor renal fxn and repeat vanc level as indicated   Height: 6' 0.99" (185.4 cm) Weight: 252 lb 10.4 oz (114.6 kg) IBW/kg (Calculated) : 79.88  Temp (24hrs), Avg:98 F (36.7 C), Min:97.6 F (36.4 C), Max:98.7 F (37.1 C)  Recent Labs  Lab 05/18/17 0223 05/18/17 1348 05/18/17 2119 05/18/17 2133 05/19/17 0050 05/19/17 0310 05/19/17 1731 05/20/17 0348 05/20/17 2044  WBC 14.9* 15.7* 14.1*  --   --  14.6*  --  7.7  --   CREATININE 1.83* 1.80* 1.79*  --   --  1.71* 1.52* 1.77*  --   LATICACIDVEN  --   --   --  3.4* 1.7  --   --   --   --   VANCOTROUGH  --   --   --   --   --   --   --   --  15    Estimated Creatinine Clearance: 50.1 mL/min (A) (by C-G formula based on SCr of 1.77 mg/dL (H)).    No Known Allergies   Zosyn 5/7>>5/9 Unasyn 5/9>> Vanc 5/7>>  5/10 VL = 15 mcg/mL (after 1750mg  load and 1500mg  x 1 - SCr 1.77)  Bcx: NGTD<24 hours   Takayla Baillie D. Mina Marble, PharmD, BCPS, [place] Pager:  [phone] 05/20/2017, 9:49 PM

## 2017-05-20 NOTE — Progress Notes (Signed)
Pt became very aggressive physically and verbally.  Attempting to hit and kick me, pulling at foley, ostomy & JP drains.  Attempted to calm pt down and redirect without any success.  Dr. Halford Chessman called and orders received for [facility] & restraints.  Will continue to monitor closely and update as needed.

## 2017-05-20 NOTE — Progress Notes (Signed)
Peripherally Inserted Central Catheter/Midline Placement  The IV Nurse has discussed with the patient and/or persons authorized to consent for the patient, the purpose of this procedure and the potential benefits and risks involved with this procedure.  The benefits include less needle sticks, lab draws from the catheter, and the patient may be discharged home with the catheter. Risks include, but not limited to, infection, bleeding, blood clot (thrombus formation), and puncture of an artery; nerve damage and irregular heartbeat and possibility to perform a PICC exchange if needed/ordered by physician.  Alternatives to this procedure were also discussed.  Bard Power PICC patient education guide, fact sheet on infection prevention and patient information card has been provided to patient /or left at bedside.    PICC/Midline Placement Documentation  PICC Triple Lumen 05/20/17 PICC Right Brachial 46 cm (Active)  Indication for Insertion or Continuance of Line Prolonged intravenous therapies 05/20/2017 12:20 PM  Exposed Catheter (cm) 0 cm 05/20/2017 12:20 PM  Site Assessment Clean;Dry;Intact 05/20/2017 12:20 PM  Lumen #1 Status Flushed;Saline locked 05/20/2017 12:20 PM  Lumen #2 Status Flushed;Blood return noted 05/20/2017 12:20 PM  Lumen #3 Status Flushed;Blood return noted 05/20/2017 12:20 PM  Dressing Type Occlusive 05/20/2017 12:20 PM  Dressing Status Clean;Dry;Intact;Antimicrobial disc in place 05/20/2017 12:20 PM  Dressing Intervention New dressing 05/20/2017 12:20 PM  Dressing Change Due 05/22/17 05/20/2017 12:20 PM       Scotty Court 05/20/2017, 12:57 PM

## 2017-05-20 NOTE — Progress Notes (Signed)
Oncology short note  Chart Reviewed, pathology reviewed. Patient with multiple post-surgical concerns/complications. No additional medical oncology recommendations at this time.  He has an outpatient f/u scheduled for 06/07/2017 with labs in the medical oncology clinic. Will discuss further at that time.    Sullivan Lone MD MS

## 2017-05-20 NOTE — Plan of Care (Signed)
Pain managed with iv dilaudid. Urine output decreased x 2 hours. 94 ml per bladder scan.

## 2017-05-20 NOTE — Progress Notes (Signed)
Hypoglycemic Event  CBG: 26  Treatment: D50 IV 50 mL x 2 per Dr. Terrence Dupont at bedside  Symptoms: Shaky and Nervous/irritable  Follow-up CBG: Time:0906 CBG Result:171  Possible Reasons for Event: Inadequate meal intake  Comments/MD notified:Harwani  1003 - CBG 129 1035 - CBG 112 1200 - CBG 85  Tonye Pearson [place]

## 2017-05-21 ENCOUNTER — Inpatient Hospital Stay (HOSPITAL_COMMUNITY): Payer: Medicare Other

## 2017-05-21 DIAGNOSIS — G9340 Encephalopathy, unspecified: Secondary | ICD-10-CM

## 2017-05-21 LAB — CBC
HCT: 26.4 % — ABNORMAL LOW (ref 39.0–52.0)
Hemoglobin: 8.3 g/dL — ABNORMAL LOW (ref 13.0–17.0)
MCH: 24.9 pg — AB (ref 26.0–34.0)
MCHC: 31.4 g/dL (ref 30.0–36.0)
MCV: 79.3 fL (ref 78.0–100.0)
PLATELETS: 170 10*3/uL (ref 150–400)
RBC: 3.33 MIL/uL — ABNORMAL LOW (ref 4.22–5.81)
RDW: 27.7 % — ABNORMAL HIGH (ref 11.5–15.5)
WBC: 12.4 10*3/uL — ABNORMAL HIGH (ref 4.0–10.5)

## 2017-05-21 LAB — BASIC METABOLIC PANEL
ANION GAP: 7 (ref 5–15)
BUN: 14 mg/dL (ref 6–20)
CHLORIDE: 105 mmol/L (ref 101–111)
CO2: 22 mmol/L (ref 22–32)
Calcium: 6.9 mg/dL — ABNORMAL LOW (ref 8.9–10.3)
Creatinine, Ser: 1.6 mg/dL — ABNORMAL HIGH (ref 0.61–1.24)
GFR calc non Af Amer: 41 mL/min — ABNORMAL LOW (ref >60)
GFR, EST AFRICAN AMERICAN: 48 mL/min — AB (ref >60)
Glucose, Bld: 91 mg/dL (ref 65–99)
POTASSIUM: 3.9 mmol/L (ref 3.5–5.1)
SODIUM: 134 mmol/L — AB (ref 135–145)

## 2017-05-21 LAB — GLUCOSE, CAPILLARY
GLUCOSE-CAPILLARY: 100 mg/dL — AB (ref 65–99)
GLUCOSE-CAPILLARY: 71 mg/dL (ref 65–99)
GLUCOSE-CAPILLARY: 73 mg/dL (ref 65–99)
GLUCOSE-CAPILLARY: 79 mg/dL (ref 65–99)
Glucose-Capillary: 76 mg/dL (ref 65–99)
Glucose-Capillary: 81 mg/dL (ref 65–99)
Glucose-Capillary: 56 mg/dL — ABNORMAL LOW (ref 65–99)
Glucose-Capillary: 83 mg/dL (ref 65–99)
Glucose-Capillary: 56 mg/dL — ABNORMAL LOW (ref 65–99)

## 2017-05-21 LAB — MAGNESIUM: MAGNESIUM: 2.2 mg/dL (ref 1.7–2.4)

## 2017-05-21 LAB — PHOSPHORUS: PHOSPHORUS: 3.5 mg/dL (ref 2.5–4.6)

## 2017-05-21 MED ORDER — TRAVASOL 10 % IV SOLN
INTRAVENOUS | Status: AC
  Administered 2017-05-21: 18:00:00 via INTRAVENOUS
  Filled 2017-05-21: qty 778.8

## 2017-05-21 MED ORDER — HALOPERIDOL LACTATE 5 MG/ML IJ SOLN
5.0000 mg | Freq: Once | INTRAMUSCULAR | Status: AC
  Administered 2017-05-21: 5 mg via INTRAVENOUS
  Filled 2017-05-21: qty 1

## 2017-05-21 MED ORDER — METOPROLOL TARTRATE 5 MG/5ML IV SOLN
2.5000 mg | Freq: Four times a day (QID) | INTRAVENOUS | Status: DC
  Administered 2017-05-21 – 2017-05-26 (×7): 2.5 mg via INTRAVENOUS
  Filled 2017-05-21 (×8): qty 5

## 2017-05-21 MED ORDER — HYDROCORTISONE NA SUCCINATE PF 100 MG IJ SOLR
50.0000 mg | Freq: Every day | INTRAMUSCULAR | Status: DC
  Administered 2017-05-22: 50 mg via INTRAVENOUS
  Filled 2017-05-21: qty 2

## 2017-05-21 MED ORDER — SODIUM CHLORIDE 0.9 % IV SOLN: INTRAVENOUS | Status: DC

## 2017-05-21 MED ORDER — DEXMEDETOMIDINE HCL IN NACL 200 MCG/50ML IV SOLN
0.4000 ug/kg/h | INTRAVENOUS | Status: DC
  Administered 2017-05-21: 0.4 ug/kg/h via INTRAVENOUS
  Administered 2017-05-21: 0.8 ug/kg/h via INTRAVENOUS
  Administered 2017-05-21: 0.2 ug/kg/h via INTRAVENOUS
  Filled 2017-05-21: qty 50
  Filled 2017-05-21: qty 100
  Filled 2017-05-21: qty 50

## 2017-05-21 NOTE — Progress Notes (Signed)
Subjective:  Patient denies any chest pain or shortness of breath  Objective:  Vital Signs in the last 24 hours: Temp:  [97.6 F (36.4 C)-98.2 F (36.8 C)] 97.6 F (36.4 C) (05/11 0757) Pulse Rate:  [35-121] 91 (05/11 1000) Resp:  [13-27] 13 (05/11 1000) BP: (70-154)/(43-136) 96/56 (05/11 1000) SpO2:  [86 %-100 %] 98 % (05/11 1000)  Intake/Output from previous day: 05/10 0701 - 05/11 0700 In: 6060.7 [I.V.:4134; IV Piggyback:1926.7] Out: 975 [Urine:560; Drains:400; Stool:15] Intake/Output from this shift: Total I/O In: 600 [I.V.:600] Out: 150 [Urine:150]  Physical Exam: Neck: no adenopathy, no carotid bruit, no JVD and supple, symmetrical, trachea midline Lungs: Clear to auscultation anterolaterally Heart: Tachycardic S1 and S2 soft there is soft systolic murmur Abdomen: More distended today bowel sounds are very faint JP catheter  draining purulent material ileostomy site okay l Extremities: No clubbing cyanosis trace edema noted  Lab Results: Recent Labs    05/20/17 0348 05/21/17 0428  WBC 7.7 12.4*  HGB 10.8* 8.3*  PLT 276 170   Recent Labs    05/20/17 0348 05/21/17 0605  NA 132* 134*  K 3.6 3.9  CL 101 105  CO2 19* 22  GLUCOSE 115* 91  BUN 11 14  CREATININE 1.77* 1.60*   No results for input(s): TROPONINI in the last 72 hours.  Invalid input(s): CK, MB Hepatic Function Panel Recent Labs    05/20/17 0348  PROT 4.8*  ALBUMIN 1.2*  AST 85*  ALT 50  ALKPHOS 76  BILITOT 1.1   No results for input(s): CHOL in the last 72 hours. No results for input(s): PROTIME in the last 72 hours.  Imaging: Imaging results have been reviewed and Dg Chest Port 1 View  Result Date: 05/21/2017 CLINICAL DATA:  Respiratory failure EXAM: PORTABLE CHEST 1 VIEW COMPARISON:  May 18, 2017 FINDINGS: There is a graft within the aorta. The cardiomediastinal silhouette is stable. The right lung is clear. A new right PICC line is difficult to see centrally but appears to terminate  in the central SVC. Opacity remains in the left base, similar in the interval. No other changes. IMPRESSION: 1. New right PICC line. The distal tip is difficult to visualize with certainty but I believe it terminates in the central SVC. Recommend attention on follow-up. 2. Persistent opacity in the left base, similar in the interval. 3. No other changes. Electronically Signed   By: Dorise Bullion III M.D   On: 05/21/2017 07:58   [place] Ekg Site Rite  Result Date: 05/19/2017 If Site Rite image not attached, placement could not be confirmed due to current cardiac rhythm.   Cardiac Studies:  Assessment/Plan:   Rectosigmoid adenocarcinoma status post resection with primary colorectal anastomosis and dilating loop ileostomy and umbilical hernia repair Inferior surgical wound infection Status post hypotensive shock secondary to intra-abdominal sepsis. Acute on chronic renal injury secondary to hypotension/sepsis. Recurrent hypoglycemia Status postAcute on chronic hypochromic microcytic anemia History of thoracic aneurysm dissection status post endograftandinfrarenal dissection status post fenestrated endovascular graft Hypertension Diabetes mellitus COPD Obesity Plan Continue present management per surgery Restart metoprolol 2.5 mg IV every 6 hours when necessary for heart rate above 100 Out of bed as tolerated     LOS: 15 days    Monchel Pollitt, Prudencio Burly 05/21/2017, 11:00 AM

## 2017-05-21 NOTE — Progress Notes (Signed)
PULMONARY / CRITICAL CARE MEDICINE   Name: Jesse Le MRN: [id number] DOB: Jul 07, 1945    ADMISSION DATE:  05/06/2017 CONSULTATION DATE:  05/18/2017  REFERRING MD:  Dr. Terrence Dupont  CHIEF COMPLAINT:  Hypotension  HISTORY OF PRESENT ILLNESS:   72 yo male presented with abdominal pain, weight loss and intermittent hematochezia.  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  Had low anterior resection with primary anastomosis with diverting loop ileostomy.  Developed hypotension, hypoglycemia 5/07.  Developed hypotension and altered mental status 5/08 and transferred to ICU. Pt has an anastomotic leak, drain placed by surgery PMHx of CAD, DM, TAA.  SUBJECTIVE:  A little bit of discomfort and associated tachypnea with turning but otherwise stable.  He is on room air.  Complains of minimal abdominal pain.  Pressors are off.  VITAL SIGNS: BP (!) 96/56   Pulse 91   Temp 97.6 F (36.4 C) (Oral)   Resp 13   Ht 6' 0.99" (1.854 m)   Wt 114.6 kg (252 lb 10.4 oz)   SpO2 98%   BMI 33.34 kg/m    INTAKE / OUTPUT: I/O last 3 completed shifts: In: 8989.8 [I.V.:6083.2; IV Piggyback:2906.7] Out: 8786 [Urine:930; Drains:720; Stool:15]  PHYSICAL EXAMINATION:  General -obese man, mild tachypnea but no true distress Eyes -equal, reactive ENT -oropharynx clear, poor dentition Cardiac -regular, borderline tachycardic in the 100s, no murmur, mild pretibial edema ileostomy well-appearing, Chest -bilateral inspiratory crackles, Abd -JP drain in place, ileostomy well-appearing Ext -1+ lower extremity edema Skin -no rash Neuro -awake, alert, oriented to self, place.  Moves all extremities with good strength.  No evidence of current agitation   LABS:  BMET Recent Labs  Lab 05/19/17 1731 05/20/17 0348 05/21/17 0605  NA 131* 132* 134*  K 3.8 3.6 3.9  CL 104 101 105  CO2 19* 19* 22  BUN 10 11 14   CREATININE 1.52* 1.77* 1.60*  GLUCOSE 278* 115* 91     Electrolytes Recent Labs  Lab 05/19/17 1731  05/20/17 0348 05/20/17 1511 05/20/17 1823 05/21/17 0605  CALCIUM 7.4*  --  7.4*  --   --  6.9*  MG  --   --  1.2* 1.6* 1.8 2.2  PHOS  --    < > 2.8 7.4* 3.4 3.5   < > = values in this interval not displayed.    CBC Recent Labs  Lab 05/19/17 0310 05/20/17 0348 05/21/17 0428  WBC 14.6* 7.7 12.4*  HGB 9.0* 10.8* 8.3*  HCT 30.2* 34.8* 26.4*  PLT 224 276 170    Coag's Recent Labs  Lab 05/19/17 0050  INR 1.60    Sepsis Markers Recent Labs  Lab 05/18/17 2133 05/19/17 0050  LATICACIDVEN 3.4* 1.7    ABG Recent Labs  Lab 05/18/17 1258 05/19/17 1615 05/20/17 0432  PHART 7.287* 7.371 7.440  PCO2ART 24.1* 29.4* 27.5*  PO2ART 80.7* 79.1* 76.1*    Liver Enzymes Recent Labs  Lab 05/20/17 0348  AST 85*  ALT 50  ALKPHOS 76  BILITOT 1.1  ALBUMIN 1.2*    Cardiac Enzymes No results for input(s): TROPONINI, PROBNP in the last 168 hours.  Glucose Recent Labs  Lab 05/20/17 2329 05/21/17 0022 05/21/17 0222 05/21/17 0603 05/21/17 0616 05/21/17 0753  GLUCAP 81 73 76 100* 81 71    Imaging Dg Chest Port 1 View  Result Date: 05/21/2017 CLINICAL DATA:  Respiratory failure EXAM: PORTABLE CHEST 1 VIEW COMPARISON:  May 18, 2017 FINDINGS: There is a graft within  the aorta. The cardiomediastinal silhouette is stable. The right lung is clear. A new right PICC line is difficult to see centrally but appears to terminate in the central SVC. Opacity remains in the left base, similar in the interval. No other changes. IMPRESSION: 1. New right PICC line. The distal tip is difficult to visualize with certainty but I believe it terminates in the central SVC. Recommend attention on follow-up. 2. Persistent opacity in the left base, similar in the interval. 3. No other changes. Electronically Signed   By: Dorise Bullion III M.D   On: 05/21/2017 07:58     STUDIES:  CT abd 4/26 >> 9 cm mass CT abd 5/09 >> discontinuity in the  posterior wall just above the anastomosis with feculent appearing material and gas in the pelvis, measuring up to 5x 3 cm anterior and superior to the sigmoid colon.  Fluid collection right lower quadrant measuring up to 6.4 x 3.5 cm. Moderate pneumoperitoneum.  CULTURES: Blood culture 5/8 >  ANTIBIOTICS: Zosyn 5/7 > 5/8 Zosyn 5/10>> Unasyn 5/8 > 5/10 Vancomycin 5/7 > Eraxis 5/8 >  SIGNIFICANT EVENTS: 4/26 admit for abdominal pain. Mass identified on CT 4/28 colonoscopy. Biopsy sent.  5/1 bx grows adeno. To OR for resection ileostomy 5/8 hypotension hypoglycemia AMS, transfer to ICU  LINES/TUBES: Lt IJ CVL  5/8 > JP Drain 5/1>  DISCUSSION: 72 yo male with sepsis from peritonitis after surgery for colon cancer and CT abd +  for anastomotic leak.>> Drain placed 5/1  ASSESSMENT / PLAN:  Sepsis from peritonitis. Hemodynamically improved, off pressors Colon cancer s/p low anterior resection with ileostomy. -Continue current Zosyn, vanco, Eraxis -Surgery following with JP drain in place -Receiving aggressive IV fluids in addition to TPN  Acute renal failure from ATN.  Oliguria Non anion gap metabolic acidosis.  Hypomag, Improved Hypo phos, improved -Follow urine output, BMP -Replace electrolytes as indicated  Tachycardia improved/ Hx. CAD - metoprolol stopped - ASA on hold  Anemia of critical illness. -follow CBC -follow for any evidence acute blood loss   DM type II. Hypoglycemia in setting of sepsis. - CBG's Q 4 -TPN initiated  Acute metabolic encephalopathy 2nd to sepsis. Improving - follow MS - minimized sedating meds - re-orientation  Relative adrenal insufficiency. - wean hydrocortisone today to 50mg  qd  DVT prophylaxis - lovenox SUP - pepcic Nutrition - NPO + TPN Goals of care - full code  We will check on him on Monday. Call if needed sooner.   Baltazar Apo, MD, PhD 05/21/2017, 10:53 AM [facility] [phone] or if no  answer [phone]

## 2017-05-21 NOTE — Progress Notes (Signed)
PCCM Interval Note  Called and notified that the patient has had some progressive agitation through the morning.  I will order Precedex so we can have it available in case he does not clear.  Baltazar Apo, MD, PhD 05/21/2017, 11:03 AM [facility] [phone] or if no answer [phone]

## 2017-05-21 NOTE — Progress Notes (Signed)
[facility] CONSULT NOTE   Pharmacy Consult for TPN Indication: Prolonged ileus  Patient Measurements: Height: 6' 0.99" (185.4 cm) Weight: 252 lb 10.4 oz (114.6 kg) IBW/kg (Calculated) : 79.88 TPN AdjBW (KG): 94.5 Body mass index is 33.34 kg/m.  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  Developed anastomotic leak with feculent output in JP drain. Patient currently with high ileostomy output. Patient's abdomen has become more distended and patient was made NPO. Pharmacy consulted for TPN.   Patient was on clear liquids (on and off NPO as well) but only tolerating minimal intake (<25%) for >10 days. Patient is at risk for refeeding.   GI: Ileostomy 950 cc out/24hrs. Output becoming more thin and clear. Pre-albumin <5. Pepcid IV. Awaiting oncology input. Possible return to OR for washout of abdomen and further drain placement.  Endo: CBGs 76-91. Stress steroids tapering.  Insulin requirements in the past 24 hours: 0 units SSI Lytes: Na 134 (up). K 3.9. Phos 3.5. Mag 2.2, CoCa 9.1 Renal: SCr 1.6 (stable for admit), however UOP significantly decreased and remaining low. BUN 14. Pulm: RA Cards: BP elevated this AM. Tachy. CVP 5.   Hepatobil: AST up at 85, ALT/AlkPhos/Tbili/TG wnl.  Neuro: Prn Hydromorphone, Oxycodone. RASS -1.  ID: Zosyn, Vancomycin, and Eraxis empiric with abdominal wound infection. Necrosis decreased per surgery notes. WBC up 12.4. Afebrile.   TPN Access: CVC Triple lumen (placed 5/8) TPN start date: 05/20/17 Nutritional Goals (per RD recommendation on 5/9): KCal: 2100- 2400  Protein: 105-120g Fluid: 2.1- 2.4 L/day  Goal TPN rate is 85 ml/hr (provides 100% of  needs)  Current Nutrition:  NPO TPN  Plan:  Increase TPN to 55 mL/hr - risk for refeeding. High risk for fatty acid deficiency - will add back lipids 2x week (no >100g per week) - Give lipids today.  This TPN provides  77.9g of protein,  218g of dextrose, and  31.5g of lipids which provides 1367 kCals per day, meeting 65% of patient needs Electrolytes in TPN: keeping standard for now  Reduce NS to 95 mL/hr when TPN starts at 1800 - Discussed with Dr. Gershon Crane Discontinue D10 at 1800 with new TPN - Discussed with Dr. Gershon Crane Add MVI, trace elements to TPN Continue sensitive SSI and adjust as needed Add Pepcid 40 mg in TPN F/U repeat BMET, Mg and Phos in AM   Sloan Leiter, PharmD, BCPS, BCCCP Clinical Pharmacist Clinical phone 05/21/2017 until 3:30PM - #[phone] After hours, please call #[phone] 05/21/2017,6:59 AM

## 2017-05-21 NOTE — Progress Notes (Signed)
10 Days Post-Op   Subjective/Chief Complaint: Patient arousable, able to answer questions Complains of a little bit of abdominal pain "not too bad"    Objective: Vital signs in last 24 hours: Temp:  [97.7 F (36.5 C)-98.2 F (36.8 C)] 98 F (36.7 C) (05/11 0448) Pulse Rate:  [35-121] 99 (05/11 0700) Resp:  [14-27] 27 (05/11 0700) BP: (70-154)/(43-136) 154/136 (05/11 0700) SpO2:  [83 %-100 %] 96 % (05/11 0700) Last BM Date: 05/20/17  Intake/Output from previous day: 05/10 0701 - 05/11 0700 In: 6060.7 [I.V.:4134; IV Piggyback:1926.7] Out: 975 [Urine:560; Drains:400; Stool:15] Intake/Output this shift: No intake/output data recorded.  General appearance: moderately obese, slowed mentation and no distress Resp: clear to auscultation bilaterally Cardio: regular rate and rhythm, S1, S2 normal, no murmur, click, rub or gallop GI: soft, minimally distended; no guarding; tender only in midline around incision; drain with thin feculent drainage; ileostomy - minimal stool in bag Wound - some necrotic tissue at base; sides granulating  Lab Results:  Recent Labs    05/20/17 0348 05/21/17 0428  WBC 7.7 12.4*  HGB 10.8* 8.3*  HCT 34.8* 26.4*  PLT 276 170   BMET Recent Labs    05/19/17 1731 05/20/17 0348  NA 131* 132*  K 3.8 3.6  CL 104 101  CO2 19* 19*  GLUCOSE 278* 115*  BUN 10 11  CREATININE 1.52* 1.77*  CALCIUM 7.4* 7.4*   PT/INR Recent Labs    05/19/17 0050  LABPROT 18.9*  INR 1.60   ABG Recent Labs    05/19/17 1615 05/20/17 0432  PHART 7.371 7.440  HCO3 16.8* 18.5*    Studies/Results: [place] Ekg Site Rite  Result Date: 05/19/2017 If Site Rite image not attached, placement could not be confirmed due to current cardiac rhythm.   Anti-infectives: Anti-infectives (From admission, onward)   Start     Dose/Rate Route Frequency Ordered Stop   05/20/17 2230  vancomycin (VANCOCIN) 1,250 mg in sodium chloride 0.9 % 250 mL IVPB     1,250 mg 166.7 mL/hr over  90 Minutes Intravenous Every 24 hours 05/20/17 2151     05/20/17 1400  piperacillin-tazobactam (ZOSYN) IVPB 3.375 g     3.375 g 12.5 mL/hr over 240 Minutes Intravenous Every 8 hours 05/20/17 1044     05/20/17 0500  anidulafungin (ERAXIS) 100 mg in sodium chloride 0.9 % 100 mL IVPB     100 mg 78 mL/hr over 100 Minutes Intravenous Every 24 hours 05/19/17 0116     05/19/17 2200  vancomycin (VANCOCIN) 1,500 mg in sodium chloride 0.9 % 500 mL IVPB  Status:  Discontinued     1,500 mg 250 mL/hr over 120 Minutes Intravenous Every 24 hours 05/18/17 2126 05/20/17 2150   05/19/17 0130  anidulafungin (ERAXIS) 200 mg in sodium chloride 0.9 % 200 mL IVPB     200 mg 78 mL/hr over 200 Minutes Intravenous  Once 05/19/17 0116 05/19/17 0520   05/19/17 0130  Ampicillin-Sulbactam (UNASYN) 3 g in sodium chloride 0.9 % 100 mL IVPB  Status:  Discontinued     3 g 200 mL/hr over 30 Minutes Intravenous Every 8 hours 05/19/17 0118 05/20/17 1044   05/18/17 2200  vancomycin (VANCOCIN) 1,750 mg in sodium chloride 0.9 % 500 mL IVPB     1,750 mg 250 mL/hr over 120 Minutes Intravenous  Once 05/18/17 2126 05/19/17 0400   05/17/17 0930  piperacillin-tazobactam (ZOSYN) IVPB 3.375 g  Status:  Discontinued     3.375 g 12.5 mL/hr over 240  Minutes Intravenous Every 8 hours 05/17/17 0836 05/19/17 0118   05/11/17 2200  cefoTEtan (CEFOTAN) 2 g in sodium chloride 0.9 % 100 mL IVPB     2 g 200 mL/hr over 30 Minutes Intravenous Every 12 hours 05/11/17 1839 05/11/17 2034   05/11/17 0600  cefoTEtan in Dextrose 5% (CEFOTAN) IVPB 2 g     2 g Intravenous On call to O.R. 05/10/17 1142 05/11/17 1416      Assessment/Plan: Coronary artery disease. History of myocardial infarction Hypertension Status post thoracic aortic dissection and stent placement  status post bilateral iliac stent placements COPD AKI/CKD II- creatinineup from 1.5 to 1.7.  UOP dropped overnight from 200cc an hr yesterday afternoon to 5-10cc/hr overnight.    NIDDM- hypoglycemia resolved.  Chronic anemia- hgb8.3 - monitor Obesity Hypokalemia -resolved; continue to monitor   Reducible umbilical hernia Rectosigmoid colon mass- S/p LAR with primary colorectal (EE)anastomosis and diverting loop ileostomy with umbilical hernia repair 07/18/65 Dr. Georgette Dover -Pathology shows adenoCA, 0/15 LNs - POD#10 -Wound infection opened up on POD 5, now with NS WD dressing changes BID or prn.Base of wound is necrotic, but fascia is still intact, improving.  -Low colorectal anastomotic leak.  Diverted.  Output less feculent and colon has likely finished draining remaining stool.  Becoming more thin and clear. -we had him on Zosyn, but this was switched overnight to unasyn, Vanc, and antifungal cover. Unasyn's antibiogram does not have as good of gram - coverage as zosyn and question this change.  -having high ileostomy output; now slower output; hold antidiarrheals  -oncology outpatient follow-up arranged    Severe protein calorie malnutrition -TNA -prealbumin <5 and albumin less than 1.2.   FEN:NPO/TNA/IVFs VTE: SCDs, lovenox JQ:GBEEF 5/7 -->5/9 Unasyn 5/9-->Vanc 5/9-->eraxis 5/9-->     LOS: 15 days    Maia Petties 05/21/2017

## 2017-05-21 NOTE — Progress Notes (Addendum)
Hypoglycemic Event  CBG: 57  Treatment: PRN Orders, 25 ML of D50  Symptoms: None Noted   Follow-up CBG: Time:0220 CBG Result:76  Possible Reasons for Event: None Noted  Comments/MD notified: No    Jesse Le
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
## 2017-05-22 LAB — GLUCOSE, CAPILLARY
GLUCOSE-CAPILLARY: 199 mg/dL — AB (ref 65–99)
GLUCOSE-CAPILLARY: 29 mg/dL — AB (ref 65–99)
GLUCOSE-CAPILLARY: 77 mg/dL (ref 65–99)
Glucose-Capillary: 71 mg/dL (ref 65–99)
Glucose-Capillary: 76 mg/dL (ref 65–99)
Glucose-Capillary: 77 mg/dL (ref 65–99)
Glucose-Capillary: 55 mg/dL — ABNORMAL LOW (ref 65–99)
Glucose-Capillary: 83 mg/dL (ref 65–99)
Glucose-Capillary: 78 mg/dL (ref 65–99)

## 2017-05-22 LAB — PHOSPHORUS: Phosphorus: 4 mg/dL (ref 2.5–4.6)

## 2017-05-22 LAB — LACTIC ACID, PLASMA: Lactic Acid, Venous: 1.7 mmol/L (ref 0.5–1.9)

## 2017-05-22 LAB — BASIC METABOLIC PANEL
Anion gap: 7 (ref 5–15)
BUN: 21 mg/dL — AB (ref 6–20)
CO2: 19 mmol/L — ABNORMAL LOW (ref 22–32)
CREATININE: 1.67 mg/dL — AB (ref 0.61–1.24)
Calcium: 6.8 mg/dL — ABNORMAL LOW (ref 8.9–10.3)
Chloride: 108 mmol/L (ref 101–111)
GFR calc Af Amer: 46 mL/min — ABNORMAL LOW (ref >60)
GFR, EST NON AFRICAN AMERICAN: 39 mL/min — AB (ref >60)
GLUCOSE: 80 mg/dL (ref 65–99)
POTASSIUM: 4.3 mmol/L (ref 3.5–5.1)
Sodium: 134 mmol/L — ABNORMAL LOW (ref 135–145)

## 2017-05-22 LAB — MAGNESIUM: Magnesium: 2.1 mg/dL (ref 1.7–2.4)

## 2017-05-22 MED ORDER — CHLORHEXIDINE GLUCONATE 0.12 % MT SOLN
15.0000 mL | Freq: Two times a day (BID) | OROMUCOSAL | Status: DC
  Administered 2017-05-22 – 2017-05-29 (×15): 15 mL via OROMUCOSAL
  Filled 2017-05-22 (×8): qty 15

## 2017-05-22 MED ORDER — NOREPINEPHRINE BITARTRATE 1 MG/ML IV SOLN
0.0000 ug/min | INTRAVENOUS | Status: DC
  Administered 2017-05-22: 5 ug/min via INTRAVENOUS
  Filled 2017-05-22 (×2): qty 4

## 2017-05-22 MED ORDER — DEXTROSE-NACL 5-0.9 % IV SOLN
INTRAVENOUS | Status: DC
  Administered 2017-05-22 – 2017-05-23 (×4): via INTRAVENOUS

## 2017-05-22 MED ORDER — ORAL CARE MOUTH RINSE
15.0000 mL | Freq: Two times a day (BID) | OROMUCOSAL | Status: DC
  Administered 2017-05-22 – 2017-05-27 (×10): 15 mL via OROMUCOSAL

## 2017-05-22 MED ORDER — DEXMEDETOMIDINE HCL IN NACL 200 MCG/50ML IV SOLN
0.1000 ug/kg/h | INTRAVENOUS | Status: DC
  Administered 2017-05-23 (×2): 0.5 ug/kg/h via INTRAVENOUS
  Administered 2017-05-23: 0.3 ug/kg/h via INTRAVENOUS
  Administered 2017-05-23: 0.1 ug/kg/h via INTRAVENOUS
  Administered 2017-05-23: 0.3 ug/kg/h via INTRAVENOUS
  Administered 2017-05-24 – 2017-05-25 (×11): 0.5 ug/kg/h via INTRAVENOUS
  Administered 2017-05-25: 0.3 ug/kg/h via INTRAVENOUS
  Administered 2017-05-25 – 2017-05-26 (×4): 0.5 ug/kg/h via INTRAVENOUS
  Administered 2017-05-26: 0.3 ug/kg/h via INTRAVENOUS
  Administered 2017-05-26 – 2017-05-28 (×9): 0.5 ug/kg/h via INTRAVENOUS
  Administered 2017-05-28: 0.4 ug/kg/h via INTRAVENOUS
  Administered 2017-05-28 (×4): 0.5 ug/kg/h via INTRAVENOUS
  Administered 2017-05-29 (×2): 0.4 ug/kg/h via INTRAVENOUS
  Filled 2017-05-22 (×5): qty 50
  Filled 2017-05-22: qty 100
  Filled 2017-05-22 (×6): qty 50
  Filled 2017-05-22: qty 150
  Filled 2017-05-22 (×2): qty 50
  Filled 2017-05-22: qty 100
  Filled 2017-05-22 (×15): qty 50
  Filled 2017-05-22: qty 100
  Filled 2017-05-22 (×3): qty 50

## 2017-05-22 MED ORDER — TRAVASOL 10 % IV SOLN
INTRAVENOUS | Status: AC
  Administered 2017-05-22: 18:00:00 via INTRAVENOUS
  Filled 2017-05-22: qty 1274.4

## 2017-05-22 MED ORDER — HYDROCORTISONE NA SUCCINATE PF 100 MG IJ SOLR
50.0000 mg | Freq: Four times a day (QID) | INTRAMUSCULAR | Status: DC
  Administered 2017-05-22 – 2017-05-24 (×7): 50 mg via INTRAVENOUS
  Filled 2017-05-22 (×7): qty 2

## 2017-05-22 NOTE — Progress Notes (Signed)
[facility] Progress Note Patient Name: Jesse Le DOB: 1945-09-28 MRN: [id number]   Date of Service  05/22/2017  HPI/Events of Note  Persistent hypotension despite holding of precedex along with fluid boluses.  CVP 13.  Aline placed verifies hypotension with BP of 67/24  eICU Interventions  Start NE gtt for BP support Recheck abg     Intervention Category Intermediate Interventions: Hypotension - evaluation and management  DETERDING,ELIZABETH 05/22/2017, 3:05 AM

## 2017-05-22 NOTE — Progress Notes (Signed)
CBG @ 8:00-56, 25 ml of IV dextrose given Repeat CBG @ 7583-07. Pharmacy aware and plan to change TPN rate.  CBG @ 12:00-55, 25 ml of IV dextrose given Repeat CBG @ 12:45-83.  Dr. Lamonte Sakai aware and new orders received to change maintenance fluids to D5NS.

## 2017-05-22 NOTE — Progress Notes (Signed)
[facility] Progress Note Patient Name: Jesse Le DOB: Feb 16, 1945 MRN: [id number]   Date of Service  05/22/2017  HPI/Events of Note  Agitation - Patient trying to get out of bed. Request to renew restraint orders and Precedex IV infusion.   eICU Interventions  Will order: 1. Renew restraint orders.  2. Precedex 0.1 - 0.5 mcg/kg/hour. Titrate to RASS = 0.         Jesse Le,Jesse Le 05/22/2017, 11:57 PM

## 2017-05-22 NOTE — Progress Notes (Signed)
Progress Note: General Surgery Service   Assessment/Plan: Patient Active Problem List   Diagnosis Date Noted  . Septic shock ([place])   . Hypoglycemia after GI (gastrointestinal) surgery   . Colostomy present (Jesse Le)   . Ventral hernia s/p primary repair 05/11/2017 05/14/2017  . Physical deconditioning 05/14/2017  . CAD (coronary atherosclerotic disease) 05/14/2017  . Diabetes mellitus without complication (Jesse Le)   . Acute blood loss anemia   . 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 Iron deficiency anemia due to chronic blood loss   . Abnormal CT scan, sigmoid colon   . Generalized abdominal pain 05/06/2017  . 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 Aftercare following surgery of the circulatory system, Jesse Le 11/22/2011   s/p Procedure(s): Colon Resection Sigmoid Low Anterior Resection with Staple Colon Relast HERNIA REPAIR UMBILICAL ADULT Creation Vertical Loop ILEOSTOMY 05/11/2017 Leak is diverted, continued feculent drainage, levophed started overnight current at 4 -continue drainage, continue antibiotics, TPN, bowel rest    LOS: 16 days  Chief Complaint/Subjective: Hypotensive overnight, levophed added  Objective: Vital signs in last 24 hours: Temp:  [97.5 F (36.4 C)-98.3 F (36.8 C)] 97.6 F (36.4 C) (05/12 0300) Pulse Rate:  [72-120] 82 (05/12 0700) Resp:  [13-31] 21 (05/12 0700) BP: (67-160)/(43-114) 77/43 (05/12 0200) SpO2:  [92 %-100 %] 99 % (05/12 0700) Arterial Line BP: (75-165)/(22-55) 124/43 (05/12 0700) Last BM Date: 05/21/17  Intake/Output from previous day: 05/11 0701 - 05/12 0700 In: 2827.9 [I.V.:2827.9] Out: 915 [Urine:700; Drains:215] Intake/Output this shift: No intake/output data recorded.   Abd: inferior aspect of wound open with fibrinous tissue along fascia  Left abdominal drain with thin brown liquid in bulb and some brown drainage at skin  Extremities: no edema  Neuro: moves all extremities, answers questions with  mumbling  Lab Results: CBC  Recent Labs    05/20/17 0348 05/21/17 0428  WBC 7.7 12.4*  HGB 10.8* 8.3*  HCT 34.8* 26.4*  PLT 276 170   BMET Recent Labs    05/21/17 0605 05/22/17 0300  NA 134* 134*  K 3.9 4.3  CL 105 108  CO2 22 19*  GLUCOSE 91 80  BUN 14 21*  CREATININE 1.60* 1.67*  CALCIUM 6.9* 6.8*   PT/INR No results for input(s): LABPROT, INR in the last 72 hours. ABG Recent Labs    05/19/17 1615 05/20/17 0432  PHART 7.371 7.440  HCO3 16.8* 18.5*    Studies/Results:  Anti-infectives: Anti-infectives (From admission, onward)   Start     Dose/Rate Route Frequency Ordered Stop   05/20/17 2230  vancomycin (VANCOCIN) 1,250 mg in sodium chloride 0.9 % 250 mL IVPB     1,250 mg 166.7 mL/hr over 90 Minutes Intravenous Every 24 hours 05/20/17 2151     05/20/17 1400  piperacillin-tazobactam (ZOSYN) IVPB 3.375 g     3.375 g 12.5 mL/hr over 240 Minutes Intravenous Every 8 hours 05/20/17 1044     05/20/17 0500  anidulafungin (ERAXIS) 100 mg in sodium chloride 0.9 % 100 mL IVPB     100 mg 78 mL/hr over 100 Minutes Intravenous Every 24 hours 05/19/17 0116     05/19/17 2200  vancomycin (VANCOCIN) 1,500 mg in sodium chloride 0.9 % 500 mL IVPB  Status:  Discontinued     1,500 mg 250 mL/hr over 120 Minutes Intravenous Every 24 hours 05/18/17 2126 05/20/17 2150   05/19/17 0130  anidulafungin (ERAXIS) 200 mg in sodium chloride 0.9 % 200 mL IVPB     200  mg 78 mL/hr over 200 Minutes Intravenous  Once 05/19/17 0116 05/19/17 0520   05/19/17 0130  Ampicillin-Sulbactam (UNASYN) 3 g in sodium chloride 0.9 % 100 mL IVPB  Status:  Discontinued     3 g 200 mL/hr over 30 Minutes Intravenous Every 8 hours 05/19/17 0118 05/20/17 1044   05/18/17 2200  vancomycin (VANCOCIN) 1,750 mg in sodium chloride 0.9 % 500 mL IVPB     1,750 mg 250 mL/hr over 120 Minutes Intravenous  Once 05/18/17 2126 05/19/17 0400   05/17/17 0930  piperacillin-tazobactam (ZOSYN) IVPB 3.375 g  Status:  Discontinued      3.375 g 12.5 mL/hr over 240 Minutes Intravenous Every 8 hours 05/17/17 0836 05/19/17 0118   05/11/17 2200  cefoTEtan (CEFOTAN) 2 g in sodium chloride 0.9 % 100 mL IVPB     2 g 200 mL/hr over 30 Minutes Intravenous Every 12 hours 05/11/17 1839 05/11/17 2034   05/11/17 0600  cefoTEtan in Dextrose 5% (CEFOTAN) IVPB 2 g     2 g Intravenous On call to O.R. 05/10/17 1142 05/11/17 1416      Medications: Scheduled Meds: . acetaminophen  650 mg Oral Q6H  . Chlorhexidine Gluconate Cloth  6 each Topical Daily  . dextrose  1 ampule Intravenous Once  . enoxaparin (LOVENOX) injection  40 mg Subcutaneous Q24H  . hydrocortisone sod succinate (SOLU-CORTEF) inj  50 mg Intravenous Daily  . insulin aspart  0-9 Units Subcutaneous Q4H  . lip balm  1 application Topical BID  . mouth rinse  15 mL Mouth Rinse BID  . metoprolol tartrate  2.5 mg Intravenous Q6H   Continuous Infusions: . sodium chloride    . anidulafungin 100 mg (05/22/17 0516)  . dexmedetomidine (PRECEDEX) IV infusion Stopped (05/21/17 2232)  . norepinephrine (LEVOPHED) Adult infusion 5.013 mcg/min (05/22/17 0600)  . piperacillin-tazobactam (ZOSYN)  IV 3.375 g (05/22/17 0625)  . TPN ADULT (ION) 55 mL/hr at 05/22/17 0600  . vancomycin Stopped (05/21/17 2358)   PRN Meds:.dextrose, [DISCONTINUED] diphenhydrAMINE **OR** diphenhydrAMINE, hydrocortisone, hydrocortisone cream, HYDROmorphone (DILAUDID) injection, [DISCONTINUED] ondansetron **OR** ondansetron (ZOFRAN) IV, prochlorperazine, sodium chloride flush  Mickeal Skinner, MD Pg# [phone] [facility]

## 2017-05-22 NOTE — Progress Notes (Signed)
[facility] CONSULT NOTE   Pharmacy Consult for TPN Indication: Prolonged ileus  Patient Measurements: Height: 6' 0.99" (185.4 cm) Weight: 252 lb 10.4 oz (114.6 kg) IBW/kg (Calculated) : 79.88 TPN AdjBW (KG): 94.5 Body mass index is 33.34 kg/m.  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  Developed anastomotic leak with feculent output in JP drain. Patient currently with high ileostomy output. Patient's abdomen has become more distended and patient was made NPO. Pharmacy consulted for TPN.   Patient was on clear liquids (on and off NPO as well) but only tolerating minimal intake (<25%) for >10 days. Patient is at risk for refeeding.   GI: Ileostomy 110 mL out/24hrs. Large BM via rectum this AM per RN. L-abdominal drain output 215 mL/24 hrs. Output becoming more thin and clear. Pre-albumin <5. Pepcid IV. Awaiting oncology input. Possible return to OR for washout of abdomen and further drain placement.  Endo: CBGs 56-80s (16 inaccurate per RN)-. Stress steroids tapering.  Insulin requirements in the past 24 hours: 0 units SSI Lytes: Na 134 (up). K 4.3. Phos 4. Mag 2.1, CoCa 9; CO2 low Renal: SCr 1.67 (stable for admit), BUN up 21, UOP low 0.3 mL/kg/hr. Pulm: RA Cards: Hypotensive overnight and Levophed restarted at 5 mcg/min. NSR (80s). CVP 3. Hepatobil: AST up at 85, ALT/AlkPhos/Tbili/TG wnl.  Neuro: Prn Hydromorphone, Oxycodone. RASS -1.  ID: Zosyn, Vancomycin, and Eraxis empiric with abdominal wound infection. Necrosis decreased per surgery notes. WBC up 12.4. Afebrile.   TPN Access: CVC Triple lumen (placed 5/8) TPN start date: 05/20/17 Nutritional Goals (per RD recommendation  on 5/9): KCal: 2100- 2400  Protein: 105-120g Fluid: 2.1- 2.4 L/day  Goal TPN rate is 90 ml/hr (provides 100% of needs) -In order to help glucose, non-lipid days: 55.5g/L AA + 23.4% dextrose versus lipid days: 55.5g/L AA, 16.4% dextrose, and 23.8g/L fat (only doing lipids 2 x week to keep <100g/week while in ICU for now) for total kcal ~2200 per daily  Current Nutrition:  NPO TPN  Plan:  Increase TPN to 90 mL/hr (goal rate) High risk for fatty acid deficiency - will add back lipids 2x week (no >100g per week while in ICU) - No lipids today. Last given 5/11 (Saturday).  This TPN provides  120g of protein,  505.4g of dextrose, and  0g of lipids which provides 2202 kCals per day, meeting 100% of kcal and protein needs Electrolytes in TPN: keeping standard for now; Cl:Ac 1:2 Reduce NS to 60 mL/hr when TPN starts at 1800 -Plan to decrease with increased TPN daily discussed and confirmed with surgery and CCM.  Add MVI, trace elements to TPN Continue sensitive SSI and adjust as needed Add Pepcid 40 mg in TPN F/U repeat BMET, Mg and Phos in AM  Sloan Leiter, PharmD, BCPS, BCCCP Clinical Pharmacist Clinical phone 05/22/2017 until 3:30PM - #[phone] After hours, please call #[phone] 05/22/2017,7:48 AM

## 2017-05-22 NOTE — Progress Notes (Signed)
Subjective:  Groggy sleepy responds to verbal commands by opening his eyes. Was started on levofed last night. Had occasional episode of nonsustained SVT on the monitor  Objective:  Vital Signs in the last 24 hours: Temp:  [97.5 F (36.4 C)-98.3 F (36.8 C)] 98 F (36.7 C) (05/12 0756) Pulse Rate:  [72-120] 90 (05/12 0800) Resp:  [13-31] 19 (05/12 0800) BP: (67-160)/(43-114) 77/43 (05/12 0200) SpO2:  [92 %-100 %] 97 % (05/12 0800) Arterial Line BP: (75-165)/(22-55) 145/47 (05/12 0800)  Intake/Output from previous day: 05/11 0701 - 05/12 0700 In: 2951.7 [I.V.:2901.7; IV Piggyback:50] Out: 829 [Urine:700; Drains:215] Intake/Output from this shift: Total I/O In: 73.8 [I.V.:73.8] Out: 110 [Drains:110]  Physical Exam: Neck: no adenopathy, no carotid bruit, no JVD and supple, symmetrical, trachea midline Lungs: Clear anteriorly Heart: regular rate and rhythm, S1, S2 normal and 2/6 systolic murmur noted in Abdomen: Mildly distended bowel sounds hypoactive mild generalized tenderness surgical dressing noted JP catheter with purulent drainage Extremities: extremities normal, atraumatic, no cyanosis or edema  Lab Results: Recent Labs    05/20/17 0348 05/21/17 0428  WBC 7.7 12.4*  HGB 10.8* 8.3*  PLT 276 170   Recent Labs    05/21/17 0605 05/22/17 0300  NA 134* 134*  K 3.9 4.3  CL 105 108  CO2 22 19*  GLUCOSE 91 80  BUN 14 21*  CREATININE 1.60* 1.67*   No results for input(s): TROPONINI in the last 72 hours.  Invalid input(s): CK, MB Hepatic Function Panel Recent Labs    05/20/17 0348  PROT 4.8*  ALBUMIN 1.2*  AST 85*  ALT 50  ALKPHOS 76  BILITOT 1.1   No results for input(s): CHOL in the last 72 hours. No results for input(s): PROTIME in the last 72 hours.  Imaging: Imaging results have been reviewed and Dg Chest Port 1 View  Result Date: 05/21/2017 CLINICAL DATA:  Respiratory failure EXAM: PORTABLE CHEST 1 VIEW COMPARISON:  May 18, 2017 FINDINGS: There is  a graft within the aorta. The cardiomediastinal silhouette is stable. The right lung is clear. A new right PICC line is difficult to see centrally but appears to terminate in the central SVC. Opacity remains in the left base, similar in the interval. No other changes. IMPRESSION: 1. New right PICC line. The distal tip is difficult to visualize with certainty but I believe it terminates in the central SVC. Recommend attention on follow-up. 2. Persistent opacity in the left base, similar in the interval. 3. No other changes. Electronically Signed   By: Dorise Bullion III M.D   On: 05/21/2017 07:58    Cardiac Studies:  Assessment/Plan:  Rectosigmoid adenocarcinoma status post resection with primary colorectal anastomosis and dilating loop ileostomy and umbilical hernia repair Inferior surgical wound infection Septichypotensive shock secondary to intra-abdominal sepsis. Acute on chronic renal injury secondary to hypotension/sepsis. 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

## 2017-05-22 NOTE — Progress Notes (Signed)
PULMONARY / CRITICAL CARE MEDICINE   Name: Jesse Le MRN: [id number] DOB: 07-Dec-1945    ADMISSION DATE:  05/06/2017 CONSULTATION DATE:  05/18/2017  REFERRING MD:  Dr. Terrence Dupont  CHIEF COMPLAINT:  Hypotension  HISTORY OF PRESENT ILLNESS:   72 yo male presented with abdominal pain, weight loss and intermittent hematochezia.  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  Had low anterior resection with primary anastomosis with diverting loop ileostomy.  Developed hypotension, hypoglycemia 5/07.  Developed hypotension and altered mental status 5/08 and transferred to ICU. Pt has an anastomotic leak, drain placed by surgery PMHx of CAD, DM, TAA.  SUBJECTIVE:  He required Precedex for part of the day yesterday due to some agitated delirium.  This cleared and the Precedex was weaned to off overnight last night. He also head some episodes of nonsustained SVT, associated hypotension.  He was started on norepinephrine and is currently on 4 He has had progressive hypoglycemia  VITAL SIGNS: BP (!) 77/43   Pulse 90   Temp 98 F (36.7 C) (Oral)   Resp 19   Ht 6' 0.99" (1.854 m)   Wt 114.6 kg (252 lb 10.4 oz)   SpO2 97%   BMI 33.34 kg/m   INTAKE / OUTPUT: I/O last 3 completed shifts: In: 5481.7 [I.V.:4901.7; IV Piggyback:580] Out: 2751 [Urine:1080; Drains:390]  PHYSICAL EXAMINATION:  General -obese man, no distress Eyes -equal, reactive ENT -port intention, oropharynx clear Cardiac -regular, no murmur, mild pretibial edema Chest -few scattered bilateral inspiratory crackles Abd -ileostomy and JP drain Ext - 1+ lower extremity edema Skin -no rash Neuro -awake, somewhat sleepy, oriented to self, answers questions and follows commands.  No current agitation   LABS:  BMET Recent Labs  Lab 05/20/17 0348 05/21/17 0605 05/22/17 0300  NA 132* 134* 134*  K 3.6 3.9 4.3  CL 101 105 108  CO2 19* 22 19*  BUN 11 14 21*  CREATININE 1.77* 1.60* 1.67*   GLUCOSE 115* 91 80    Electrolytes Recent Labs  Lab 05/20/17 0348  05/20/17 1823 05/21/17 0605 05/22/17 0300  CALCIUM 7.4*  --   --  6.9* 6.8*  MG 1.2*   < > 1.8 2.2 2.1  PHOS 2.8   < > 3.4 3.5 4.0   < > = values in this interval not displayed.    CBC Recent Labs  Lab 05/19/17 0310 05/20/17 0348 05/21/17 0428  WBC 14.6* 7.7 12.4*  HGB 9.0* 10.8* 8.3*  HCT 30.2* 34.8* 26.4*  PLT 224 276 170    Coag's Recent Labs  Lab 05/19/17 0050  INR 1.60    Sepsis Markers Recent Labs  Lab 05/18/17 2133 05/19/17 0050 05/22/17 0300  LATICACIDVEN 3.4* 1.7 1.7    ABG Recent Labs  Lab 05/18/17 1258 05/19/17 1615 05/20/17 0432  PHART 7.287* 7.371 7.440  PCO2ART 24.1* 29.4* 27.5*  PO2ART 80.7* 79.1* 76.1*    Liver Enzymes Recent Labs  Lab 05/20/17 0348  AST 85*  ALT 50  ALKPHOS 76  BILITOT 1.1  ALBUMIN 1.2*    Cardiac Enzymes No results for input(s): TROPONINI, PROBNP in the last 168 hours.  Glucose Recent Labs  Lab 05/21/17 2317 05/22/17 0137 05/22/17 0243 05/22/17 0331 05/22/17 0740 05/22/17 0839  GLUCAP 56* 71 76 77 16* 77    Imaging No results found.   STUDIES:  CT abd 4/26 >> 9 cm mass CT abd 5/09 >> discontinuity in the posterior wall just above the anastomosis with feculent  appearing material and gas in the pelvis, measuring up to 5x 3 cm anterior and superior to the sigmoid colon.  Fluid collection right lower quadrant measuring up to 6.4 x 3.5 cm. Moderate pneumoperitoneum.  CULTURES: Blood culture 5/8 >  ANTIBIOTICS: Zosyn 5/7 > 5/8 Zosyn 5/10>> Unasyn 5/8 > 5/10 Vancomycin 5/7 > Eraxis 5/8 >  SIGNIFICANT EVENTS: 4/26 admit for abdominal pain. Mass identified on CT 4/28 colonoscopy. Biopsy sent.  5/1 bx grows adeno. To OR for resection ileostomy 5/8 hypotension hypoglycemia AMS, transfer to ICU  LINES/TUBES: Lt IJ CVL  5/8 > JP Drain 5/1>  DISCUSSION: 72 yo male with sepsis from peritonitis after surgery for colon  cancer and CT abd +  for anastomotic leak.>> Drain placed 5/1  ASSESSMENT / PLAN:  Sepsis with septic shock from peritonitis.  Restarted on norepinephrine, overall volume resuscitation is been 6-1/2 L over the last 3 days Colon cancer s/p low anterior resection with ileostomy. Continue current Zosyn, vancomycin, Eraxis JP drain in place appears to be draining purulent fluid IV fluid resuscitation, attempt to wean norepinephrine as able Restart stress dose steroids (had been weaning)  Acute renal failure from ATN.  Oliguria improved slightly over last 24 hours Non anion gap metabolic acidosis.  Hypomag, Improved Hypo phos, improved Follow urine output, BMP Replace electrolytes as indicated Volume resuscitation for now, avoiding Lasix  Tachycardia improved/ Hx. CAD Scheduled metoprolol Aspirin on hold  Anemia of critical illness. Follow CBC, for any evidence of possible acute blood loss   DM type II. Hypoglycemia in setting of sepsis, continues to be a problem. Scheduled CBG Restart stress dose steroids, question some degree of adrenal insufficiency contributing to his hypoglycemia Replacing dextrose aggressively in his TPN, discussed with pharmacy on 1/66  Acute metabolic encephalopathy 2nd to sepsis. Improving Follow mental status off of Precedex Minimize sedating medications Continue to reorient  Relative adrenal insufficiency. Hydrocortisone back to 50 mg every 6 hours  DVT prophylaxis - lovenox SUP - pepcid Nutrition - NPO + TPN Goals of care - full code  Independent CC time 31 minutes  Baltazar Apo, MD, PhD 05/22/2017, 10:35 AM [facility] [phone] or if no answer [phone]

## 2017-05-22 NOTE — Procedures (Signed)
Arterial Catheter Insertion Procedure Note Denilson Salminen [id number] [birth year]  Procedure: Insertion of Arterial Catheter  Indications: Blood pressure monitoring and Frequent blood sampling  Procedure Details Consent: Unable to obtain consent because of emergent medical necessity. Time Out: Verified patient identification, verified procedure, site/side was marked, verified correct patient position, special equipment/implants available, medications/allergies/relevent history reviewed, required imaging and test results available.  Performed  Maximum sterile technique was used including antiseptics, cap, gloves, gown, hand hygiene, mask and sheet. Skin prep: Chlorhexidine; local anesthetic administered 22 gauge catheter was inserted into left radial artery using the Seldinger technique. ULTRASOUND GUIDANCE USED: YES Evaluation Blood flow good; BP tracing good. Complications: No apparent complications.   Kelle Darting 05/22/2017 230AM

## 2017-05-23 LAB — GLUCOSE, CAPILLARY
GLUCOSE-CAPILLARY: 158 mg/dL — AB (ref 65–99)
GLUCOSE-CAPILLARY: 219 mg/dL — AB (ref 65–99)
GLUCOSE-CAPILLARY: 243 mg/dL — AB (ref 65–99)
GLUCOSE-CAPILLARY: 290 mg/dL — AB (ref 65–99)
GLUCOSE-CAPILLARY: 60 mg/dL — AB (ref 65–99)
GLUCOSE-CAPILLARY: 75 mg/dL (ref 65–99)
GLUCOSE-CAPILLARY: 79 mg/dL (ref 65–99)
GLUCOSE-CAPILLARY: 90 mg/dL (ref 65–99)
Glucose-Capillary: 326 mg/dL — ABNORMAL HIGH (ref 65–99)
Glucose-Capillary: 50 mg/dL — ABNORMAL LOW (ref 65–99)
Glucose-Capillary: 56 mg/dL — ABNORMAL LOW (ref 65–99)
Glucose-Capillary: 57 mg/dL — ABNORMAL LOW (ref 65–99)
Glucose-Capillary: 85 mg/dL (ref 65–99)
Glucose-Capillary: 98 mg/dL (ref 65–99)
Glucose-Capillary: 272 mg/dL — ABNORMAL HIGH (ref 65–99)
Glucose-Capillary: 16 mg/dL — CL (ref 65–99)
Glucose-Capillary: 263 mg/dL — ABNORMAL HIGH (ref 65–99)

## 2017-05-23 LAB — PHOSPHORUS: PHOSPHORUS: 4.6 mg/dL (ref 2.5–4.6)

## 2017-05-23 LAB — CULTURE, BLOOD (ROUTINE X 2)
Culture: NO GROWTH
Culture: NO GROWTH

## 2017-05-23 LAB — COMPREHENSIVE METABOLIC PANEL
ALT: 20 U/L (ref 17–63)
ANION GAP: 8 (ref 5–15)
AST: 34 U/L (ref 15–41)
Albumin: <1 g/dL — ABNORMAL LOW (ref 3.5–5.0)
Alkaline Phosphatase: 56 U/L (ref 38–126)
BILIRUBIN TOTAL: 0.6 mg/dL (ref 0.3–1.2)
BUN: 22 mg/dL — AB (ref 6–20)
CHLORIDE: 109 mmol/L (ref 101–111)
CO2: 20 mmol/L — ABNORMAL LOW (ref 22–32)
Calcium: 7.3 mg/dL — ABNORMAL LOW (ref 8.9–10.3)
Creatinine, Ser: 1.46 mg/dL — ABNORMAL HIGH (ref 0.61–1.24)
GFR calc Af Amer: 54 mL/min — ABNORMAL LOW (ref >60)
GFR, EST NON AFRICAN AMERICAN: 46 mL/min — AB (ref >60)
Glucose, Bld: 256 mg/dL — ABNORMAL HIGH (ref 65–99)
POTASSIUM: 4.6 mmol/L (ref 3.5–5.1)
Sodium: 137 mmol/L (ref 135–145)
TOTAL PROTEIN: 4.2 g/dL — AB (ref 6.5–8.1)

## 2017-05-23 LAB — DIFFERENTIAL
BASOS ABS: 0 10*3/uL (ref 0.0–0.1)
Basophils Relative: 0 %
EOS ABS: 0 10*3/uL (ref 0.0–0.7)
Eosinophils Relative: 0 %
LYMPHS PCT: 6 %
Lymphs Abs: 0.7 10*3/uL (ref 0.7–4.0)
MONOS PCT: 5 %
Monocytes Absolute: 0.6 10*3/uL (ref 0.1–1.0)
NEUTROS ABS: 9.7 10*3/uL — AB (ref 1.7–7.7)
NEUTROS PCT: 89 %

## 2017-05-23 LAB — CBC
HCT: 26.9 % — ABNORMAL LOW (ref 39.0–52.0)
HEMOGLOBIN: 8.6 g/dL — AB (ref 13.0–17.0)
MCH: 24.2 pg — ABNORMAL LOW (ref 26.0–34.0)
MCHC: 32 g/dL (ref 30.0–36.0)
MCV: 75.8 fL — ABNORMAL LOW (ref 78.0–100.0)
PLATELETS: 174 10*3/uL (ref 150–400)
RBC: 3.55 MIL/uL — AB (ref 4.22–5.81)
RDW: 27 % — ABNORMAL HIGH (ref 11.5–15.5)
WBC: 11 10*3/uL — ABNORMAL HIGH (ref 4.0–10.5)

## 2017-05-23 LAB — TRIGLYCERIDES: Triglycerides: 45 mg/dL (ref <150)

## 2017-05-23 LAB — MAGNESIUM: MAGNESIUM: 2.2 mg/dL (ref 1.7–2.4)

## 2017-05-23 LAB — PREALBUMIN

## 2017-05-23 MED ORDER — HYDRALAZINE HCL 20 MG/ML IJ SOLN
10.0000 mg | INTRAMUSCULAR | Status: DC | PRN
  Administered 2017-05-23 – 2017-05-24 (×2): 10 mg via INTRAVENOUS
  Filled 2017-05-23: qty 1

## 2017-05-23 MED ORDER — NICARDIPINE HCL IN NACL 20-0.86 MG/200ML-% IV SOLN
3.0000 mg/h | INTRAVENOUS | Status: AC
  Administered 2017-05-23: 2.5 mg/h via INTRAVENOUS
  Administered 2017-05-23 (×2): 5 mg/h via INTRAVENOUS
  Administered 2017-05-24 (×3): 7.5 mg/h via INTRAVENOUS
  Filled 2017-05-23 (×6): qty 200

## 2017-05-23 MED ORDER — LABETALOL HCL 5 MG/ML IV SOLN
10.0000 mg | INTRAVENOUS | Status: DC | PRN
  Administered 2017-05-23: 10 mg via INTRAVENOUS
  Filled 2017-05-23: qty 4

## 2017-05-23 MED ORDER — HYDRALAZINE HCL 20 MG/ML IJ SOLN
INTRAMUSCULAR | Status: AC
  Administered 2017-05-23: 10 mg via INTRAVENOUS
  Filled 2017-05-23: qty 1

## 2017-05-23 MED ORDER — TRAVASOL 10 % IV SOLN
INTRAVENOUS | Status: AC
  Administered 2017-05-23: 17:00:00 via INTRAVENOUS
  Filled 2017-05-23: qty 1274.4

## 2017-05-23 MED ORDER — DEXTROSE-NACL 5-0.9 % IV SOLN: INTRAVENOUS | Status: DC

## 2017-05-23 NOTE — Progress Notes (Signed)
PT Cancellation Note  Patient Details Name: Oaklan Persons MRN: [id number] DOB: [birth year]   Cancelled Treatment:    Reason Eval/Treat Not Completed: Medical issues which prohibited therapy(pt on precedex and RN states to hold)   Marleta Lapierre B Jakeria Caissie 05/23/2017, 9:01 AM [facility] Reach, [place]

## 2017-05-23 NOTE — Progress Notes (Signed)
PULMONARY / CRITICAL CARE MEDICINE   Name: Jesse Le MRN: [id number] DOB: May 21, 1945    ADMISSION DATE:  05/06/2017 CONSULTATION DATE:  05/18/2017  REFERRING MD:  Dr. Terrence Dupont  CHIEF COMPLAINT:  Hypotension  HISTORY OF PRESENT ILLNESS:   72 yo male presented with abdominal pain, weight loss and intermittent hematochezia.  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  Had low anterior resection with primary anastomosis with diverting loop ileostomy.  Developed hypotension, hypoglycemia 5/07.  Developed hypotension and altered mental status 5/08 and transferred to ICU. Pt has an anastomotic leak, drain placed by surgery PMHx of CAD, DM, TAA.  5/13 The patient is awake and tracking but not really verbal. BP is running a bit high. 170-180. Patient remains ion a small dose of precedex at 0.21mcg/kg. respiratotory status appears comfortable. Does not appear to be onpressors at this time.   SUBJECTIVE:  He required Precedex for part of the day yesterday due to some agitated delirium.  This cleared and the Precedex was weaned to off overnight last night. He also head some episodes of nonsustained SVT, associated hypotension.  He was started on norepinephrine and is currently on 4 He has had progressive hypoglycemia  VITAL SIGNS: BP (!) 77/43   Pulse 67   Temp (!) 96.2 F (35.7 C) (Axillary)   Resp 16   Ht 6' 0.99" (1.854 m)   Wt 252 lb 10.4 oz (114.6 kg)   SpO2 98%   BMI 33.34 kg/m   INTAKE / OUTPUT: I/O last 3 completed shifts: In: 5051.2 [I.V.:4451.2; Other:500; IV Piggyback:100] Out: 2075 [Urine:1095; Drains:255; Stool:725]  PHYSICAL EXAMINATION:  General -obese man, no distress Eyes -equal, reactive ENT -port intention, oropharynx clear Cardiac -regular, no murmur, mild pretibial edema Chest -few scattered bilateral inspiratory crackles Abd -ileostomy and JP drain Ext - 1+ lower extremity edema Skin -no rash Neuro -awake, somewhat sleepy,  Not really seaking at this time current agitation   LABS:  BMET Recent Labs  Lab 05/21/17 0605 05/22/17 0300 05/23/17 0356  NA 134* 134* 137  K 3.9 4.3 4.6  CL 105 108 109  CO2 22 19* 20*  BUN 14 21* 22*  CREATININE 1.60* 1.67* 1.46*  GLUCOSE 91 80 256*    Electrolytes Recent Labs  Lab 05/21/17 0605 05/22/17 0300 05/23/17 0356  CALCIUM 6.9* 6.8* 7.3*  MG 2.2 2.1 2.2  PHOS 3.5 4.0 4.6    CBC Recent Labs  Lab 05/20/17 0348 05/21/17 0428 05/23/17 0356  WBC 7.7 12.4* 11.0*  HGB 10.8* 8.3* 8.6*  HCT 34.8* 26.4* 26.9*  PLT 276 170 174    Coag's Recent Labs  Lab 05/19/17 0050  INR 1.60    Sepsis Markers Recent Labs  Lab 05/18/17 2133 05/19/17 0050 05/22/17 0300  LATICACIDVEN 3.4* 1.7 1.7    ABG Recent Labs  Lab 05/18/17 1258 05/19/17 1615 05/20/17 0432  PHART 7.287* 7.371 7.440  PCO2ART 24.1* 29.4* 27.5*  PO2ART 80.7* 79.1* 76.1*    Liver Enzymes Recent Labs  Lab 05/20/17 0348 05/23/17 0356  AST 85* 34  ALT 50 20  ALKPHOS 76 56  BILITOT 1.1 0.6  ALBUMIN 1.2* <1.0*    Cardiac Enzymes No results for input(s): TROPONINI, PROBNP in the last 168 hours.  Glucose Recent Labs  Lab 05/22/17 1210 05/22/17 1246 05/22/17 1546 05/22/17 2048 05/22/17 2339 05/23/17 0347  GLUCAP 55* 83 78 158* 199* 243*    Imaging No results found.   STUDIES:  CT abd 4/26 >>  9 cm mass CT abd 5/09 >> discontinuity in the posterior wall just above the anastomosis with feculent appearing material and gas in the pelvis, measuring up to 5x 3 cm anterior and superior to the sigmoid colon.  Fluid collection right lower quadrant measuring up to 6.4 x 3.5 cm. Moderate pneumoperitoneum.  CULTURES: Blood culture 5/8 >  ANTIBIOTICS: Zosyn 5/7 > 5/8 Zosyn 5/10>> Unasyn 5/8 > 5/10 Vancomycin 5/7 > Eraxis 5/8 >  SIGNIFICANT EVENTS: 4/26 admit for abdominal pain. Mass identified on CT 4/28 colonoscopy. Biopsy sent.  5/1 bx grows adeno. To OR for resection  ileostomy 5/8 hypotension hypoglycemia AMS, transfer to ICU  LINES/TUBES: Lt IJ CVL  5/8 > JP Drain 5/1>  DISCUSSION: 72 yo male with sepsis from peritonitis after surgery for colon cancer and CT abd +  for anastomotic leak.>> Drain placed 5/1  ASSESSMENT / PLAN:  Sepsis with septic shock from peritonitis.  Restarted on norepinephrine, overall volume resuscitation is been 6-1/2 L over the last 3 days Colon cancer s/p low anterior resection with ileostomy. Continue current Zosyn, vancomycin, Eraxis JP drain in place appears to be draining purulent fluid IV fluid resuscitation, attempt to wean norepinephrine as able Restart stress dose steroids (had been weaning). It appears that the patient is doing better hemodynamically at this time. Will momnitor BP to see if the patient requires additional medication.  Acute renal failure from ATN.  Oliguria improved slightly over last 24 hours Non anion gap metabolic acidosis.  AG is 8 at this time   Tachycardia improved/ Hx. CAD Scheduled metoprolol Aspirin on hold  Anemia of critical illness. Follow CBC, for any evidence of possible acute blood loss. Hb is up to 9.7.   DM type II. Hypoglycemia in setting of sepsis, continues to be a problem. Scheduled CBG Restart stress dose steroids, question some degree of adrenal insufficiency contributing to his hypoglycemia Replacing dextrose aggressively in his TPN, discussed with pharmacy on 5/12. Blood sugars are vacillatinmg quite a bit lately. May be up lately due to stress dose steroids.  Acute metabolic encephalopathy 2nd to sepsis. Improving Follow mental status off of Precedex Minimize sedating medications Continue to reorient  Relative adrenal insufficiency. Hydrocortisone back to 50 mg every 6 hours  CXR persisistent small opacity at the left base. No mjaor cough 02 sats on nasal cannula in the high 90s. DVT prophylaxis - lovenox SUP - pepcid Nutrition - NPO + TPN Goals of care  - full code  Independent CC time 31 minutes  Baltazar Apo, MD, PhD 05/23/2017, 8:46 AM [facility] [phone] or if no answer [phone]

## 2017-05-23 NOTE — Progress Notes (Signed)
Subjective:   events of yesterday noted.  Patient started on Precedex pressure and drowsy and sleepy. Pulled out JP drain yesterday Surgical wound now widely open and packed.  Started on stress doses of steroids was initiated.  Blood sugars improved. Objective:  Vital Signs in the last 24 hours: Temp:  [96.2 F (35.7 C)-99.1 F (37.3 C)] 96.2 F (35.7 C) (05/13 0355) Pulse Rate:  [59-126] 67 (05/13 0700) Resp:  [11-28] 16 (05/13 0700) SpO2:  [94 %-100 %] 98 % (05/13 0700) Arterial Line BP: (97-251)/(37-107) 147/53 (05/13 0700)  Intake/Output from previous day: 05/12 0701 - 05/13 0700 In: 4144 [I.V.:3594; IV Piggyback:50] Out: 8115 [Urine:745; Drains:215; Stool:725] Intake/Output from this shift: No intake/output data recorded.  Physical Exam: Neck: no adenopathy, no carotid bruit, no JVD and supple, symmetrical, trachea midline Lungs: clear to auscultation anterolaterally Heart: regular rate and rhythm, S1, S2 normal and 2/6 systolic murmur noted Abdomen: distended.  Bowel sounds faint.  Surgical dressing noted Extremities: extremities normal, atraumatic, no cyanosis or edema  Lab Results: Recent Labs    05/21/17 0428 05/23/17 0356  WBC 12.4* 11.0*  HGB 8.3* 8.6*  PLT 170 174   Recent Labs    05/22/17 0300 05/23/17 0356  NA 134* 137  K 4.3 4.6  CL 108 109  CO2 19* 20*  GLUCOSE 80 256*  BUN 21* 22*  CREATININE 1.67* 1.46*   No results for input(s): TROPONINI in the last 72 hours.  Invalid input(s): CK, MB Hepatic Function Panel Recent Labs    05/23/17 0356  PROT 4.2*  ALBUMIN <1.0*  AST 34  ALT 20  ALKPHOS 56  BILITOT 0.6   No results for input(s): CHOL in the last 72 hours. No results for input(s): PROTIME in the last 72 hours.  Imaging: Imaging results have been reviewed and No results found.  Cardiac Studies:  Assessment/Plan:  Rectosigmoid adenocarcinoma status post resection with primary colorectal anastomosis and dilating loop ileostomy and  umbilical hernia repair Abdominal surgical wound infection Septichypotensive shock secondary to intra-abdominal sepsis. Acute on chronic renal injury secondary to hypotension/sepsis. 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

## 2017-05-23 NOTE — Consult Note (Signed)
[place] Nurse ostomy follow up Surgical team following for assessment and plan of care for abd wound.  Stoma type/location:Ileostomy surgery performed on 5/1 Stoma red and viable, 1 inch, above skin level. There is a full thickness wound to outer stoma where rod was previously located; .2X.2X.2cm at 3:00 o'clock, applied barrier ring to assist with protecting site and to promote healing. Output:large amtliquid brown stool Ostomy pouching:2pc.  Education provided:Pt is in ICU and was very groggy, but had pulled off previous ostomy pouch when agitated. No family present for teaching session.Placed in high output pouch attached to bedside drainage bag. Supplies at bedside for staff nurse use, educational materials left in the room.  Ptwill not be able to change or empty pouch without assistance; recommend home health assistance upon discharge; please order if desired. Enrolled patient in [facility] Discharge program:Yes [place] team will continue to follow while in the hospital for further teaching sessions when stable and out of ICU. Julien Girt MSN, RN, [place], [place], [place]

## 2017-05-23 NOTE — Progress Notes (Signed)
[facility] CONSULT NOTE   Pharmacy Consult for TPN Indication: Prolonged ileus  Patient Measurements: Height: 6' 0.99" (185.4 cm) Weight: 252 lb 10.4 oz (114.6 kg) IBW/kg (Calculated) : 79.88 TPN AdjBW (KG): 94.5 Body mass index is 33.34 kg/m.  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  Developed anastomotic leak with feculent output in JP drain. Patient currently with high ileostomy output. Patient's abdomen has become more distended and patient was made NPO. Pharmacy consulted for TPN.   Patient was on clear liquids (on and off NPO as well) but only tolerating minimal intake (<25%) for >10 days. Patient is at risk for refeeding.   GI: Ileostomy 125 mL out/24hrs. Flexi-seal placed for stool from rectum.  JP drain pulled out by pt yest. Pre-albumin <5. IV Pepcid. Awaiting oncology input. Possible return to OR for washout of abdomen and drain replacement.  Endo: CBGs 158-256 s/p resuming steroids and likely to need SSI now. Stress steroids resumed for ?adrenal insufficiency   Insulin requirements in the past 24 hours: 0 units SSI Lytes: Na 137 (up). K 4.6. Phos 4.6. Mag 2.2, CoCa 9; CO2 low Renal: SCr 1.46 (stable for admit), BUN up 21, UOP low 0.3 mL/kg/hr. D5NS at 95 ml/hr Pulm: 2L [facility] Negron Torres Cards: BP nml-elevated, Levophed off. NSR. CVP 12. Hepatobil: LFTs/AlkPhos/Tbili/TG wnl.  Neuro: Prn Hydromorphone, Oxycodone. Precedex started, RASS 1.  ID: Zosyn, Vancomycin, and Eraxis empiric with abdominal wound infection. Wound re-opened and packed on 5/12. WBC down 11. Afebrile.   TPN Access: CVC Triple lumen (placed 5/8) TPN start date: 05/20/17 Nutritional Goals (per RD recommendation  on 5/9): KCal: 2100- 2400  Protein: 105-120g Fluid: 2.1- 2.4 L/day  Goal TPN rate is 90 ml/hr (provides 100% of needs) -In order to help glucose, non-lipid days: 55.5g/L AA + 23.4% dextrose versus lipid days: 55.5g/L AA, 16.4% dextrose, and 23.8g/L fat (only doing lipids 2 x week to keep <100g/week while in ICU for now) for total kcal ~2200 per daily  Current Nutrition:  NPO TPN  Plan:  Continue TPN at 90 mL/hr (goal rate) High risk for fatty acid deficiency - will add back lipids 2x week (no >100g per week while in ICU) - No lipids today. Last given 5/11 (Saturday).  This TPN provides  120g of protein,  505.4g of dextrose, and  0g of lipids which provides 2202 kCals per day, meeting 100% of kcal and protein needs Electrolytes in TPN: keeping standard for now; Cl:Ac 1:2 Add MVI, trace elements to TPN Continue sensitive SSI and adjust as needed Add Pepcid 40 mg in TPN F/U repeat BMET, Mg and Phos in AM   Renold Genta, PharmD, BCPS Clinical Pharmacist Clinical phone for 05/23/2017 until 3p is [phone] After 3p, please call Main Rx at [phone] for assistance 05/23/2017 9:01 AM

## 2017-05-23 NOTE — Progress Notes (Signed)
Spoke with Dr. Terrence Dupont on the phone regarding no change in his BP readings. Received an order for Hydralazine 10mg  Q 4 hours as needed for SBP >160.

## 2017-05-23 NOTE — Progress Notes (Signed)
Spoke with Dr. Terrence Dupont on the phone regarding patients BP. Patients A-line BP's are 180-210/ 68-77. Received an order to stop D5 NS and give Labetalol 10 mg Q 4 hours as needed for a SBP > 160.

## 2017-05-23 NOTE — Progress Notes (Signed)
[facility] Progress Note Patient Name: Jesse Le DOB: 1945-04-21 MRN: [id number]   Date of Service  05/23/2017  HPI/Events of Note  Agitation - Request to renew restraint orders.   eICU Interventions  Will renew restraint orders.      Intervention Category Minor Interventions: Agitation / anxiety - evaluation and management  [name] 05/23/2017, 10:04 PM

## 2017-05-23 NOTE — Progress Notes (Signed)
Spoke with Dr. Debbora Dus on the phone regarding Precedex and patients high BP's. Pharmacist stated 28% of patients getting Precedex have high BP's. Dr. Debbora Dus added a Cardene drip to help improve BP readings. Will continue to monitor.

## 2017-05-23 NOTE — Progress Notes (Signed)
Patient ID: Jesse Le, male   DOB: July 20, 1945, 72 y.o.   MRN: [id number]    12 Days Post-Op  Subjective: Pt off pressor support just a few minutes ago.  Now on precedex.  He is restrained at his wrist, but prior to be restrained he pulled his JP drain out.  flexi-seal was placed overnight for stool from his rectum.  Patient is sedated currently from precedex.  Objective: Vital signs in last 24 hours: Temp:  [96.2 F (35.7 C)-99.1 F (37.3 C)] 96.2 F (35.7 C) (05/13 0355) Pulse Rate:  [59-126] 67 (05/13 0700) Resp:  [11-28] 16 (05/13 0700) SpO2:  [94 %-100 %] 98 % (05/13 0700) Arterial Line BP: (97-251)/(37-107) 147/53 (05/13 0700) Last BM Date: 05/22/17  Intake/Output from previous day: 05/12 0701 - 05/13 0700 In: 4144 [I.V.:3594; IV Piggyback:50] Out: 9470 [Urine:745; Drains:215; Stool:725] Intake/Output this shift: No intake/output data recorded.  PE: Gen: sedated Heart: regular Lungs: CTAB Abd: soft, but still somewhat bloated, JP drain site with some tan cloudy drainage from site.  Midline wound is more necrotic with some loosening of his fascial sutures at the base. RLQ ileostomy with no output currently. 125cc documented output yesterday.  600cc documented from rectal tube.  The wound is more necrotic today than previously and now extends up under his remaining staples.  The remaining staples were removed and the entirety of his wound is now being packed.       Lab Results:  Recent Labs    05/21/17 0428 05/23/17 0356  WBC 12.4* 11.0*  HGB 8.3* 8.6*  HCT 26.4* 26.9*  PLT 170 174   BMET Recent Labs    05/22/17 0300 05/23/17 0356  NA 134* 137  K 4.3 4.6  CL 108 109  CO2 19* 20*  GLUCOSE 80 256*  BUN 21* 22*  CREATININE 1.67* 1.46*  CALCIUM 6.8* 7.3*   PT/INR No results for input(s): LABPROT, INR in the last 72 hours. CMP     Component Value Date/Time   NA 137 05/23/2017 0356   K 4.6 05/23/2017 0356   CL 109 05/23/2017 0356   CO2 20 (L)  05/23/2017 0356   GLUCOSE 256 (H) 05/23/2017 0356   BUN 22 (H) 05/23/2017 0356   CREATININE 1.46 (H) 05/23/2017 0356   CALCIUM 7.3 (L) 05/23/2017 0356   PROT 4.2 (L) 05/23/2017 0356   ALBUMIN <1.0 (L) 05/23/2017 0356   AST 34 05/23/2017 0356   ALT 20 05/23/2017 0356   ALKPHOS 56 05/23/2017 0356   BILITOT 0.6 05/23/2017 0356   GFRNONAA 46 (L) 05/23/2017 0356   GFRAA 54 (L) 05/23/2017 0356   Lipase     Component Value Date/Time   LIPASE 22 05/06/2017 1036       Studies/Results: No results found.  Anti-infectives: Anti-infectives (From admission, onward)   Start     Dose/Rate Route Frequency Ordered Stop   05/20/17 2230  vancomycin (VANCOCIN) 1,250 mg in sodium chloride 0.9 % 250 mL IVPB     1,250 mg 166.7 mL/hr over 90 Minutes Intravenous Every 24 hours 05/20/17 2151     05/20/17 1400  piperacillin-tazobactam (ZOSYN) IVPB 3.375 g     3.375 g 12.5 mL/hr over 240 Minutes Intravenous Every 8 hours 05/20/17 1044     05/20/17 0500  anidulafungin (ERAXIS) 100 mg in sodium chloride 0.9 % 100 mL IVPB     100 mg 78 mL/hr over 100 Minutes Intravenous Every 24 hours 05/19/17 0116     05/19/17 2200  vancomycin (  VANCOCIN) 1,500 mg in sodium chloride 0.9 % 500 mL IVPB  Status:  Discontinued     1,500 mg 250 mL/hr over 120 Minutes Intravenous Every 24 hours 05/18/17 2126 05/20/17 2150   05/19/17 0130  anidulafungin (ERAXIS) 200 mg in sodium chloride 0.9 % 200 mL IVPB     200 mg 78 mL/hr over 200 Minutes Intravenous  Once 05/19/17 0116 05/19/17 0520   05/19/17 0130  Ampicillin-Sulbactam (UNASYN) 3 g in sodium chloride 0.9 % 100 mL IVPB  Status:  Discontinued     3 g 200 mL/hr over 30 Minutes Intravenous Every 8 hours 05/19/17 0118 05/20/17 1044   05/18/17 2200  vancomycin (VANCOCIN) 1,750 mg in sodium chloride 0.9 % 500 mL IVPB     1,750 mg 250 mL/hr over 120 Minutes Intravenous  Once 05/18/17 2126 05/19/17 0400   05/17/17 0930  piperacillin-tazobactam (ZOSYN) IVPB 3.375 g  Status:   Discontinued     3.375 g 12.5 mL/hr over 240 Minutes Intravenous Every 8 hours 05/17/17 0836 05/19/17 0118   05/11/17 2200  cefoTEtan (CEFOTAN) 2 g in sodium chloride 0.9 % 100 mL IVPB     2 g 200 mL/hr over 30 Minutes Intravenous Every 12 hours 05/11/17 1839 05/11/17 2034   05/11/17 0600  cefoTEtan in Dextrose 5% (CEFOTAN) IVPB 2 g     2 g Intravenous On call to O.R. 05/10/17 1142 05/11/17 1416       Assessment/Plan Coronary artery disease. History of myocardial infarction Hypertension Status post thoracic aortic dissection and stent placement  status post bilateral iliac stent placements COPD AKI/CKD II- creatinine1.46 today. 745cc/24hrs NIDDM-hypoglycemia resolved. Chronic anemia- hgb8.6 - monitor Obesity Hypokalemia -resolved; continue to monitor Agitation -  Now restrained and on precedex   Reducible umbilical hernia Rectosigmoid colon mass- S/p LAR with primary colorectal (EE)anastomosis and diverting loop ileostomy with umbilical hernia repair 07/14/51 Dr. Georgette Dover -Pathology shows adenoCA, 0/15 LNs - POD#12 -Wound infection opened up on POD 5, now with NS WD dressing changes BID or prn.Base of wound is necrotic, but fascia is still intact, but sutures are widening at the base.  The remaining staples in the wound were removed POD 12.  Watch base of wound closely.  Now on 76 of solu-cortef.  -Low colorectal anastomotic leak. Diverted.  JP drain pulled out overnight.  May require CT scan at some point to evaluate for possible new drain placement by IR given he no longer has a drain managing this leak. -high ileostomy output resolved.  All anti-diarrheals have been stopped.  Output yesterday was only 125cc total.  -oncology outpatient follow-up arranged  Severe protein calorie malnutrition -TNA -prealbumin <5 and albumin less than 1.0   FEN:NPO/TNA/IVFs VTE: SCDs, lovenox GD:JMEQA 5/7 -->5/9 Unasyn 5/9-->5/10Vanc 5/9-->eraxis 5/9-->  Zosyn  5/10-->    LOS: 17 days    [name] , [facility] 05/23/2017, 7:52 AM Pager: [phone]

## 2017-05-24 LAB — BASIC METABOLIC PANEL
ANION GAP: 5 (ref 5–15)
BUN: 23 mg/dL — AB (ref 6–20)
CO2: 20 mmol/L — ABNORMAL LOW (ref 22–32)
Calcium: 7.4 mg/dL — ABNORMAL LOW (ref 8.9–10.3)
Chloride: 109 mmol/L (ref 101–111)
Creatinine, Ser: 1.03 mg/dL (ref 0.61–1.24)
Glucose, Bld: 264 mg/dL — ABNORMAL HIGH (ref 65–99)
Potassium: 5.1 mmol/L (ref 3.5–5.1)
SODIUM: 134 mmol/L — AB (ref 135–145)

## 2017-05-24 LAB — GLUCOSE, CAPILLARY
GLUCOSE-CAPILLARY: 253 mg/dL — AB (ref 65–99)
GLUCOSE-CAPILLARY: 248 mg/dL — AB (ref 65–99)
Glucose-Capillary: 266 mg/dL — ABNORMAL HIGH (ref 65–99)
Glucose-Capillary: 219 mg/dL — ABNORMAL HIGH (ref 65–99)
Glucose-Capillary: 136 mg/dL — ABNORMAL HIGH (ref 65–99)
Glucose-Capillary: 106 mg/dL — ABNORMAL HIGH (ref 65–99)

## 2017-05-24 LAB — PHOSPHORUS: PHOSPHORUS: 3.3 mg/dL (ref 2.5–4.6)

## 2017-05-24 LAB — MAGNESIUM: MAGNESIUM: 2.1 mg/dL (ref 1.7–2.4)

## 2017-05-24 LAB — VANCOMYCIN, TROUGH: VANCOMYCIN TR: 16 ug/mL (ref 15–20)

## 2017-05-24 MED ORDER — FUROSEMIDE 10 MG/ML IJ SOLN
40.0000 mg | Freq: Once | INTRAMUSCULAR | Status: AC
  Administered 2017-05-24: 40 mg via INTRAVENOUS
  Filled 2017-05-24: qty 4

## 2017-05-24 MED ORDER — NICARDIPINE HCL IN NACL 40-0.83 MG/200ML-% IV SOLN
30.0000 mg/h | INTRAVENOUS | Status: DC
  Filled 2017-05-24: qty 200

## 2017-05-24 MED ORDER — NICARDIPINE HCL IN NACL 40-0.83 MG/200ML-% IV SOLN
3.0000 mg/h | INTRAVENOUS | Status: DC
  Administered 2017-05-24 (×4): 10 mg/h via INTRAVENOUS
  Administered 2017-05-25: 12.5 mg/h via INTRAVENOUS
  Administered 2017-05-25 (×4): 10 mg/h via INTRAVENOUS
  Administered 2017-05-26: 11.5 mg/h via INTRAVENOUS
  Administered 2017-05-26: 6 mg/h via INTRAVENOUS
  Administered 2017-05-26: 11.5 mg/h via INTRAVENOUS
  Administered 2017-05-26: 15 mg/h via INTRAVENOUS
  Administered 2017-05-26: 11.5 mg/h via INTRAVENOUS
  Administered 2017-05-27 (×6): 12 mg/h via INTRAVENOUS
  Administered 2017-05-28: 10 mg/h via INTRAVENOUS
  Administered 2017-05-28: 12 mg/h via INTRAVENOUS
  Administered 2017-05-28: 10 mg/h via INTRAVENOUS
  Administered 2017-05-28 (×2): 12 mg/h via INTRAVENOUS
  Administered 2017-05-28: 5 mg/h via INTRAVENOUS
  Filled 2017-05-24 (×30): qty 200

## 2017-05-24 MED ORDER — HYDROCORTISONE NA SUCCINATE PF 100 MG IJ SOLR
50.0000 mg | Freq: Two times a day (BID) | INTRAMUSCULAR | Status: DC
  Administered 2017-05-24 – 2017-05-27 (×7): 50 mg via INTRAVENOUS
  Filled 2017-05-24 (×7): qty 2

## 2017-05-24 MED ORDER — LABETALOL HCL 5 MG/ML IV SOLN: 20.0000 mg | INTRAVENOUS | Status: DC | PRN

## 2017-05-24 MED ORDER — TRAVASOL 10 % IV SOLN
INTRAVENOUS | Status: AC
  Administered 2017-05-24: 17:00:00 via INTRAVENOUS
  Filled 2017-05-24: qty 1188

## 2017-05-24 MED ORDER — HYDRALAZINE HCL 20 MG/ML IJ SOLN
20.0000 mg | INTRAMUSCULAR | Status: DC | PRN
  Administered 2017-05-25 – 2017-06-13 (×2): 20 mg via INTRAVENOUS
  Filled 2017-05-24 (×3): qty 1

## 2017-05-24 MED ORDER — INSULIN ASPART 100 UNIT/ML ~~LOC~~ SOLN
0.0000 [IU] | SUBCUTANEOUS | Status: DC
  Administered 2017-05-24: 3 [IU] via SUBCUTANEOUS
  Administered 2017-05-24: 4 [IU] via SUBCUTANEOUS
  Administered 2017-05-24: 11 [IU] via SUBCUTANEOUS
  Administered 2017-05-24: 7 [IU] via SUBCUTANEOUS
  Administered 2017-05-25: 3 [IU] via SUBCUTANEOUS
  Administered 2017-05-25: 4 [IU] via SUBCUTANEOUS
  Administered 2017-05-25: 3 [IU] via SUBCUTANEOUS
  Administered 2017-05-25: 4 [IU] via SUBCUTANEOUS
  Administered 2017-05-25: 3 [IU] via SUBCUTANEOUS
  Administered 2017-05-26: 7 [IU] via SUBCUTANEOUS
  Administered 2017-05-26 (×3): 4 [IU] via SUBCUTANEOUS
  Administered 2017-05-26 – 2017-05-27 (×2): 7 [IU] via SUBCUTANEOUS
  Administered 2017-05-27: 4 [IU] via SUBCUTANEOUS
  Administered 2017-05-27: 7 [IU] via SUBCUTANEOUS
  Administered 2017-05-27 (×2): 4 [IU] via SUBCUTANEOUS
  Administered 2017-05-27: 3 [IU] via SUBCUTANEOUS
  Administered 2017-05-28 (×2): 4 [IU] via SUBCUTANEOUS
  Administered 2017-05-28: 3 [IU] via SUBCUTANEOUS

## 2017-05-24 NOTE — Progress Notes (Signed)
Pharmacy Antibiotic Note  Jesse Le is a 72 y.o. male admitted on 05/06/2017 with sepsis. Now being treated for intra-abdominal infection.  Pharmacy has been consulted for Vancomycin, Zosyn, Eraxis dosing. Previously patient was on Vanc and Zosyn that was switched to Unasyn. Due to infected drain, the medical team wants better gram negative coverage so change Unasyn back to Zosyn.  Afebrile, WBC normalized, Scr improving significantly today.   Plan:  Vancomycin 1500 mg q24h (goal 15-20) Vanc trough prior to next dose tonight. Continue Zosyn 3.375g IV q 8 hrs Continue Eraxis 100 mg q24h F/u ID recommendations.  Monitor clinic progress, WBC, TMax, renal function, electrolytes. F/u future de-escalation and length of therapy.   Height: 6' 0.99" (185.4 cm) Weight: 252 lb 10.4 oz (114.6 kg) IBW/kg (Calculated) : 79.88  Temp (24hrs), Avg:96.5 F (35.8 C), Min:96 F (35.6 C), Max:97.6 F (36.4 C)  Recent Labs  Lab 05/18/17 2119 05/18/17 2133 05/19/17 0050 05/19/17 0310  05/20/17 0348 05/20/17 2044 05/21/17 0428 05/21/17 0605 05/22/17 0300 05/23/17 0356 05/24/17 0339  WBC 14.1*  --   --  14.6*  --  7.7  --  12.4*  --   --  11.0*  --   CREATININE 1.79*  --   --  1.71*   < > 1.77*  --   --  1.60* 1.67* 1.46* 1.03  LATICACIDVEN  --  3.4* 1.7  --   --   --   --   --   --  1.7  --   --   VANCOTROUGH  --   --   --   --   --   --  15  --   --   --   --   --    < > = values in this interval not displayed.    Estimated Creatinine Clearance: 86 mL/min (by C-G formula based on SCr of 1.03 mg/dL).    No Known Allergies  Antimicrobials this admission: Zosyn 5/7>>5/9, 5/10> Unasyn 5/9>>5/10 Vanc 5/7>> Eraxis 5/8>  Dose adjustments this admission: None  Microbiology results: 5/8 BCx: Ngtd 5/8 MRSA PCR: Neg  Thank you for allowing pharmacy to be a part of this patient's care.  Marguerite Olea, [facility] Clinical Pharmacist Pager [phone]  05/24/2017 11:24  AM

## 2017-05-24 NOTE — Progress Notes (Signed)
PULMONARY / CRITICAL CARE MEDICINE   Name: Jesse Le MRN: [id number] DOB: [birth year]    ADMISSION DATE:  05/06/2017 CONSULTATION DATE:  05/18/2017  REFERRING MD:  Dr. Terrence Dupont  CHIEF COMPLAINT:  Hypotension  HISTORY OF PRESENT ILLNESS:   72 yo male presented with abdominal pain, weight loss and intermittent hematochezia.  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  Had low anterior resection with primary anastomosis with diverting loop ileostomy.  Developed hypotension, hypoglycemia 5/07.  Developed hypotension and altered mental status 5/08 and transferred to ICU. Pt has an anastomotic leak, drain placed by surgery PMHx of CAD, DM, TAA.  5/13 The patient is awake and tracking but not really verbal. BP is running a bit high. 170-180. Patient remains ion a small dose of precedex at 0.47mcg/kg. respiratotory status appears comfortable. Does not appear to be onpressors at this time.  514 Little overall change in status The patient rermains lethargic and non-communicative. BP has been high requiring nicardipine drip in addition to prn labetalol and hydralazine Discussed the situation with surgery. The patient had pulled out his RLQ drain. A new CT scan ihas been ordered and he may need drain replaced      VITAL SIGNS: BP (!) 183/72   Pulse 68   Temp (!) 96 F (35.6 C) (Axillary)   Resp 18   Ht 6' 0.99" (1.854 m)   Wt 252 lb 10.4 oz (114.6 kg)   SpO2 97%   BMI 33.34 kg/m   INTAKE / OUTPUT: I/O last 3 completed shifts: In: 6238.7 [I.V.:5613.7; Other:45; IV Piggyback:580] Out: 2841 [Urine:2400; Drains:75; Stool:850]  PHYSICAL EXAMINATION:  General -obese man, no distress Eyes -equal, reactive ENT -port intention, oropharynx clear Cardiac -regular, no murmur, mild pretibial edema Chest -few scattered bilateral inspiratory crackles Abd -ileostomy and JP drain Ext - 1+ lower extremity edema Skin -no rash Neuro -awake, somewhat sleepy, Not  really seaking at this time current agitation   LABS:  BMET Recent Labs  Lab 05/22/17 0300 05/23/17 0356 05/24/17 0339  NA 134* 137 134*  K 4.3 4.6 5.1  CL 108 109 109  CO2 19* 20* 20*  BUN 21* 22* 23*  CREATININE 1.67* 1.46* 1.03  GLUCOSE 80 256* 264*    Electrolytes Recent Labs  Lab 05/22/17 0300 05/23/17 0356 05/24/17 0339  CALCIUM 6.8* 7.3* 7.4*  MG 2.1 2.2 2.1  PHOS 4.0 4.6 3.3    CBC Recent Labs  Lab 05/20/17 0348 05/21/17 0428 05/23/17 0356  WBC 7.7 12.4* 11.0*  HGB 10.8* 8.3* 8.6*  HCT 34.8* 26.4* 26.9*  PLT 276 170 174    Coag's Recent Labs  Lab 05/19/17 0050  INR 1.60    Sepsis Markers Recent Labs  Lab 05/18/17 2133 05/19/17 0050 05/22/17 0300  LATICACIDVEN 3.4* 1.7 1.7    ABG Recent Labs  Lab 05/18/17 1258 05/19/17 1615 05/20/17 0432  PHART 7.287* 7.371 7.440  PCO2ART 24.1* 29.4* 27.5*  PO2ART 80.7* 79.1* 76.1*    Liver Enzymes Recent Labs  Lab 05/20/17 0348 05/23/17 0356  AST 85* 34  ALT 50 20  ALKPHOS 76 56  BILITOT 1.1 0.6  ALBUMIN 1.2* <1.0*    Cardiac Enzymes No results for input(s): TROPONINI, PROBNP in the last 168 hours.  Glucose Recent Labs  Lab 05/23/17 0933 05/23/17 1355 05/23/17 1600 05/23/17 2030 05/23/17 2317 05/24/17 0337  GLUCAP 326* 272* 219* 263* 290* 266*    Imaging No results found.   STUDIES:  CT abd 4/26 >>  9 cm mass CT abd 5/09 >> discontinuity in the posterior wall just above the anastomosis with feculent appearing material and gas in the pelvis, measuring up to 5x 3 cm anterior and superior to the sigmoid colon.  Fluid collection right lower quadrant measuring up to 6.4 x 3.5 cm. Moderate pneumoperitoneum.  CULTURES: Blood culture 5/8 >  ANTIBIOTICS: Zosyn 5/7 > 5/8 Zosyn 5/10>> Unasyn 5/8 > 5/10 Vancomycin 5/7 > Eraxis 5/8 >  SIGNIFICANT EVENTS: 4/26 admit for abdominal pain. Mass identified on CT 4/28 colonoscopy. Biopsy sent.  5/1 bx grows adeno. To OR for  resection ileostomy 5/8 hypotension hypoglycemia AMS, transfer to ICU  LINES/TUBES: Lt IJ CVL  5/8 > JP Drain 5/1>  DISCUSSION: 72 yo male with sepsis from peritonitis after surgery for colon cancer and CT abd +  for anastomotic leak.>> Drain placed 5/1  ASSESSMENT / PLAN:  Sepsis with septic shock from peritonitis.  Restarted on norepinephrine, overall volume resuscitation is been 6-1/2 L over the last 3 days Colon cancer s/p low anterior resection with ileostomy. Continue current Zosyn, vancomycin, Eraxis JP drain in place appears to be draining purulent fluid IV fluid resuscitation, attempt to wean norepinephrine as able Restart stress dose steroids (had been weaning). It appears that the patient is doing better hemodynamically at this time.   Discussed the plan with surgery re new CT scan and possible new drain placement.  Systolic HTN BP has been hard to control. The patient is on a cardene drip at 10mg /hr. In addition there are prn orders for hydralazine and labetalol   Acute renal failure from ATN.   Oliguria improved slightly over last 24 hours  Renal fn. Better Patient had better than 2200 liters out yesterday. Creatiine has trended down to 1.03     DM type II. Hypoglycemia in setting of sepsis, continues to be a problem. Scheduled CBG Restart stress dose steroids, question some degree of adrenal insufficiency contributing to his hypoglycemia Replacing dextrose aggressively in his TPN, discussed with pharmacy on 5/12. Blood sugars are vacillatinmg quite a bit lately. May be up lately due to stress dose steroids.  Acute metabolic encephalopathy 2nd to sepsis. Improving Follow mental status off of Precedex Minimize sedating medications Continue to reorient  Relative adrenal insufficiency. Hydrocortisone back to 50 mg every 6 hours. I have decreased hydrocort to q 12h.  CXR persisistent small opacity at the left base. No mjaor cough 02 sats on nasal cannula in the  high 90s. DVT prophylaxis - lovenox SUP - pepcid Nutrition - NPO + TPN Goals of care - full code   Micheal Likens MD [facility]

## 2017-05-24 NOTE — Progress Notes (Signed)
Orthopedic Tech Progress Note Patient Details:  Jesse Le August 06, 1945 [id number]  Ortho Devices Type of Ortho Device: Abdominal binder       Jesse Le 05/24/2017, 12:25 PM

## 2017-05-24 NOTE — Progress Notes (Signed)
Pharmacy Antibiotic Note  Jesse Le is a 72 y.o. male admitted on 05/06/2017 with sepsis. Now being treated for intra-abdominal infection.  Pharmacy has been consulted for Vancomycin, Zosyn, Eraxis dosing.   Scr improving significantly today. A vancomycin trough drawn tonight is in range at 72mcg/mL.  Plan:  Vancomycin 1250mg  IV q24h (goal 15-20). If renal function continues to improve, a dose increase may be warranted. Zosyn 3.375g IV q 8 hrs Eraxis 100 mg q24h F/u ID recommendations, renal function, LOT, clinical progression  Height: 6' 0.99" (185.4 cm) Weight: 252 lb 10.4 oz (114.6 kg) IBW/kg (Calculated) : 79.88  Temp (24hrs), Avg:96.2 F (35.7 C), Min:96 F (35.6 C), Max:96.5 F (35.8 C)  Recent Labs  Lab 05/18/17 2119 05/18/17 2133 05/19/17 0050 05/19/17 0310  05/20/17 0348 05/20/17 2044 05/21/17 0428 05/21/17 0605 05/22/17 0300 05/23/17 0356 05/24/17 0339 05/24/17 2014  WBC 14.1*  --   --  14.6*  --  7.7  --  12.4*  --   --  11.0*  --   --   CREATININE 1.79*  --   --  1.71*   < > 1.77*  --   --  1.60* 1.67* 1.46* 1.03  --   LATICACIDVEN  --  3.4* 1.7  --   --   --   --   --   --  1.7  --   --   --   VANCOTROUGH  --   --   --   --   --   --  15  --   --   --   --   --  16   < > = values in this interval not displayed.    Estimated Creatinine Clearance: 86 mL/min (by C-G formula based on SCr of 1.03 mg/dL).    No Known Allergies  Antimicrobials this admission: Zosyn 5/7>>5/9; 5/10>> Unasyn 5/9>>5/10 Vancomycin 5/7>> Eraxis 5/8>>  Dose adjustments this admission: 5/14 VT =16 > cont 1250/24h  Microbiology results: 5/8 BCx: Neg 5/8 MRSA PCR: Neg  Thank you for allowing pharmacy to be a part of this patient's care.  Jesse Le D. Jesse Le, PharmD, [place] Clinical Pharmacist [phone] 05/24/2017 9:09 PM

## 2017-05-24 NOTE — Progress Notes (Signed)
CSW continuing to follow and assist with disposition when pt is stable  Jorge Ny, [place] Social Worker [phone]

## 2017-05-24 NOTE — Progress Notes (Signed)
Subjective:   patient sedated on low-dose Precedex. Response to verbal commands by opening his mouth. Renal function markedly improved. Blood pressure remains elevated started on Cardene drip.  Objective:  Vital Signs in the last 24 hours: Temp:  [96 F (35.6 C)-97.6 F (36.4 C)] 96 F (35.6 C) (05/14 0841) Pulse Rate:  [45-92] 68 (05/14 0900) Resp:  [12-27] 18 (05/14 0900) BP: (183)/(72) 183/72 (05/13 1224) SpO2:  [91 %-100 %] 97 % (05/14 0900) Arterial Line BP: (124-235)/(45-81) 188/59 (05/14 0900)  Intake/Output from previous day: 05/13 0701 - 05/14 0700 In: 3957.3 [I.V.:3432.3; IV Piggyback:480] Out: 2275 [Urine:2050; Stool:225] Intake/Output from this shift: Total I/O In: 376.1 [I.V.:376.1] Out: 225 [Urine:225]  Physical Exam: Neck: no adenopathy, no carotid bruit, no JVD and supple, symmetrical, trachea midline Lungs: Clear to auscultation anterolaterally Heart: regular rate and rhythm, S1, S2 normal and Soft systolic murmur noted Abdomen: Distended surgical dressing noted bowel sounds absent. Ileostomy bag noted Extremities: No clubbing cyanosis 1+ edema noted  Lab Results: Recent Labs    05/23/17 0356  WBC 11.0*  HGB 8.6*  PLT 174   Recent Labs    05/23/17 0356 05/24/17 0339  NA 137 134*  K 4.6 5.1  CL 109 109  CO2 20* 20*  GLUCOSE 256* 264*  BUN 22* 23*  CREATININE 1.46* 1.03   No results for input(s): TROPONINI in the last 72 hours.  Invalid input(s): CK, MB Hepatic Function Panel Recent Labs    05/23/17 0356  PROT 4.2*  ALBUMIN <1.0*  AST 34  ALT 20  ALKPHOS 56  BILITOT 0.6   No results for input(s): CHOL in the last 72 hours. No results for input(s): PROTIME in the last 72 hours.  Imaging: Imaging results have been reviewed and No results found.  Cardiac Studies:  Assessment/Plan:  Rectosigmoid adenocarcinoma status post resection with primary colorectal anastomosis and dilating loop ileostomy and umbilical hernia repair Abdominal  surgical wound infection Status postSeptichypotensive shock secondary to intra-abdominal sepsis. Status postAcute on chronic renal injury secondary to hypotension/sepsis. 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

## 2017-05-24 NOTE — Progress Notes (Signed)
[place] [facility] CONSULT NOTE   Pharmacy Consult for TPN Indication: Prolonged ileus  Patient Measurements: Height: 6' 0.99" (185.4 cm) Weight: 252 lb 10.4 oz (114.6 kg) IBW/kg (Calculated) : 79.88 TPN AdjBW (KG): 94.5 Body mass index is 33.34 kg/m.  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  Developed anastomotic leak with feculent output in JP drain. Patient currently with high ileostomy output. Patient's abdomen has become more distended and patient was made NPO. Pharmacy consulted for TPN.   Patient was on clear liquids (on and off NPO as well) but only tolerating minimal intake (<25%) for >10 days. Patient is at risk for refeeding.   GI: Ileostomy 25 mL out/24hrs. Flexi-seal placed for stool from rectum.  JP drain pulled out by pt 5/12. Pre-albumin <5. IV Pepcid. Awaiting oncology input. Possible return to OR for washout of abdomen and drain replacement.  Endo: Was hypoglycemic, CBGs >200 s/p resuming steroids. Stress steroids resumed for possible adrenal insufficiency   Insulin requirements in the past 24 hours: 27 units SSI Lytes: Na 134 (down). K 5.1. Phos 3.3. Mag 2.1, CoCa ~9; CO2 low Renal: SCr down to 1.03, BUN up 23, UOP 0.7 mL/kg/hr. Pulm: RA Cards: BP elevated - nicardipine added. NSR/SB. CVP 7. Hepatobil: LFTs/AlkPhos/Tbili/TG wnl.  Neuro: Prn Hydromorphone, Oxycodone. Precedex for agitation, RASS 1.  ID: Zosyn, Vancomycin, and Eraxis empiric with abdominal wound infection. Wound re-opened and packed on 5/12. WBC down 11. Afebrile.   TPN Access: CVC Triple lumen (placed 5/8) TPN start date: 05/20/17 Nutritional Goals (per RD recommendation on 5/9): KCal: 2100- 2400   Protein: 105-120g Fluid: 2.1- 2.4 L/day  Goal TPN rate is 90 ml/hr (provides 100% of needs) -In order to help glucose, non-lipid days: 55.5g/L AA + 23.4% dextrose versus lipid days: 55.5g/L AA, 16.4% dextrose, and 23.8g/L fat (only doing lipids 2 x week to keep <100g/week while in ICU for now) for total kcal ~2200 per daily  Current Nutrition:  NPO TPN  Plan:  Continue TPN at 90 mL/hr (goal rate) High risk for fatty acid deficiency - will add back lipids 2x week (no >100g per week while in ICU) -  Add lipids today. Last given 5/11 (Saturday).  This TPN provides  120g of protein,  354g of dextrose, and  51g of lipids which provides 2227 kCals per day, meeting 100% of kcal and protein needs Electrolytes in TPN: increase Na, decrease K, maximize Cl Add MVI, trace elements to TPN Add 15 units regular insulin to TPN Change to resistant SSI and adjust as needed Add Pepcid 40 mg in TPN F/U repeat BMET in am   Jesse Le, PharmD, BCPS Clinical Pharmacist Clinical phone for 05/24/2017 until 3p is [phone] After 3p, please call Main Rx at [phone] for assistance 05/24/2017 7:09 AM

## 2017-05-24 NOTE — Progress Notes (Signed)
PT Cancellation Note  Patient Details Name: Jesse Le MRN: [id number] DOB: 04-Feb-1945   Cancelled Treatment:    Reason Eval/Treat Not Completed: Medical issues which prohibited therapy. Pt remains confused, on precedex.. RN asked to hold today. Acute PT to return as able, as appropriate.  Kittie Plater, PT, DPT Pager #: [phone] Office #: [phone]   [facility] 05/24/2017, 7:50 AM

## 2017-05-24 NOTE — Progress Notes (Signed)
Patient ID: Jesse Le, male   DOB: 09-03-1945, 72 y.o.   MRN: [id number]    13 Days Post-Op  Subjective: Patient confused this morning.  Asked me not to take his clothes off.  Doesn't know where he is.  Objective: Vital signs in last 24 hours: Temp:  [96 F (35.6 C)-97.6 F (36.4 C)] 96 F (35.6 C) (05/13 2330) Pulse Rate:  [45-92] 49 (05/14 0700) Resp:  [12-27] 13 (05/14 0700) BP: (183)/(72) 183/72 (05/13 1224) SpO2:  [91 %-100 %] 97 % (05/14 0700) Arterial Line BP: (124-235)/(45-81) 160/48 (05/14 0700) Last BM Date: 05/23/17  Intake/Output from previous day: 05/13 0701 - 05/14 0700 In: 3957.3 [I.V.:3432.3; IV Piggyback:480] Out: 2275 [Urine:2050; Stool:225] Intake/Output this shift: No intake/output data recorded.  PE: Gen: confused, but otherwise NAD Abd: soft, old drain site with some tan, thin, but some purulent appearing drainage on bandage.  flexi-seal not removed and had some purulent tan appearing output present.  Midline wound with some dehiscence.  No visible bowel.  Green drainage c/w pseudomonas on dressing.   Lab Results:  Recent Labs    05/23/17 0356  WBC 11.0*  HGB 8.6*  HCT 26.9*  PLT 174   BMET Recent Labs    05/23/17 0356 05/24/17 0339  NA 137 134*  K 4.6 5.1  CL 109 109  CO2 20* 20*  GLUCOSE 256* 264*  BUN 22* 23*  CREATININE 1.46* 1.03  CALCIUM 7.3* 7.4*   PT/INR No results for input(s): LABPROT, INR in the last 72 hours. CMP     Component Value Date/Time   NA 134 (L) 05/24/2017 0339   K 5.1 05/24/2017 0339   CL 109 05/24/2017 0339   CO2 20 (L) 05/24/2017 0339   GLUCOSE 264 (H) 05/24/2017 0339   BUN 23 (H) 05/24/2017 0339   CREATININE 1.03 05/24/2017 0339   CALCIUM 7.4 (L) 05/24/2017 0339   PROT 4.2 (L) 05/23/2017 0356   ALBUMIN <1.0 (L) 05/23/2017 0356   AST 34 05/23/2017 0356   ALT 20 05/23/2017 0356   ALKPHOS 56 05/23/2017 0356   BILITOT 0.6 05/23/2017 0356   GFRNONAA >60 05/24/2017 0339   GFRAA >60 05/24/2017 0339    Lipase     Component Value Date/Time   LIPASE 22 05/06/2017 1036       Studies/Results: No results found.  Anti-infectives: Anti-infectives (From admission, onward)   Start     Dose/Rate Route Frequency Ordered Stop   05/20/17 2230  vancomycin (VANCOCIN) 1,250 mg in sodium chloride 0.9 % 250 mL IVPB     1,250 mg 166.7 mL/hr over 90 Minutes Intravenous Every 24 hours 05/20/17 2151     05/20/17 1400  piperacillin-tazobactam (ZOSYN) IVPB 3.375 g     3.375 g 12.5 mL/hr over 240 Minutes Intravenous Every 8 hours 05/20/17 1044     05/20/17 0500  anidulafungin (ERAXIS) 100 mg in sodium chloride 0.9 % 100 mL IVPB     100 mg 78 mL/hr over 100 Minutes Intravenous Every 24 hours 05/19/17 0116     05/19/17 2200  vancomycin (VANCOCIN) 1,500 mg in sodium chloride 0.9 % 500 mL IVPB  Status:  Discontinued     1,500 mg 250 mL/hr over 120 Minutes Intravenous Every 24 hours 05/18/17 2126 05/20/17 2150   05/19/17 0130  anidulafungin (ERAXIS) 200 mg in sodium chloride 0.9 % 200 mL IVPB     200 mg 78 mL/hr over 200 Minutes Intravenous  Once 05/19/17 0116 05/19/17 0520   05/19/17 0130  Ampicillin-Sulbactam (UNASYN) 3 g in sodium chloride 0.9 % 100 mL IVPB  Status:  Discontinued     3 g 200 mL/hr over 30 Minutes Intravenous Every 8 hours 05/19/17 0118 05/20/17 1044   05/18/17 2200  vancomycin (VANCOCIN) 1,750 mg in sodium chloride 0.9 % 500 mL IVPB     1,750 mg 250 mL/hr over 120 Minutes Intravenous  Once 05/18/17 2126 05/19/17 0400   05/17/17 0930  piperacillin-tazobactam (ZOSYN) IVPB 3.375 g  Status:  Discontinued     3.375 g 12.5 mL/hr over 240 Minutes Intravenous Every 8 hours 05/17/17 0836 05/19/17 0118   05/11/17 2200  cefoTEtan (CEFOTAN) 2 g in sodium chloride 0.9 % 100 mL IVPB     2 g 200 mL/hr over 30 Minutes Intravenous Every 12 hours 05/11/17 1839 05/11/17 2034   05/11/17 0600  cefoTEtan in Dextrose 5% (CEFOTAN) IVPB 2 g     2 g Intravenous On call to O.R. 05/10/17 1142 05/11/17  1416       Assessment/Plan Coronary artery disease. History of myocardial infarction Hypertension Status post thoracic aortic dissection and stent placement  status post bilateral iliac stent placements COPD AKI/CKD II- creatinine1.46 today. 745cc/24hrs NIDDM-hypoglycemia resolved. Chronic anemia- hgb8.6 - monitor Obesity Hypokalemia -resolved; continue to monitor Agitation -  Now restrained and on precedex   Reducible umbilical hernia Rectosigmoid colon mass- S/p LAR with primary colorectal (EE)anastomosis and diverting loop ileostomy with umbilical hernia repair 06/12/68 Dr. Georgette Dover -Pathology shows adenoCA, 0/15 LNs - POD#13 -Wound infection opened up on POD 5, now with NS WD dressing changes BID or prn.Base of wound is necrotic, but fascia is still intact, but sutures are widening at the base.  The remaining staples in the wound were removed POD 12.  Watch base of wound closely, more dehiscence today.  Will place in an abdominal binder.  Now on 42 of solu-cortef. -no plans for Dakin's given some dehiscence of his wound.  -Low colorectal anastomotic leak.Diverted.JP drain pulled out overnight.  May require CT scan at some point to evaluate for possible new drain placement by IR given he no longer has a drain managing this leak. -high ileostomy output resolved.  All anti-diarrheals have been stopped.  Output yesterday was only 225cc total.  -oncology outpatient follow-up arranged  Severe protein calorie malnutrition -TNA -prealbumin <5 and albumin less than 1.0   FEN:NPO/TNA/IVFs VTE: SCDs, lovenox JJ:[id number] 5/7 -->5/9 Unasyn 5/9-->5/10Vanc 5/9-->eraxis 5/9-->  Zosyn 5/10-->     LOS: 18 days    [name] , [facility] 05/24/2017, 7:50 AM Pager: [phone]

## 2017-05-24 NOTE — Plan of Care (Signed)
  Problem: Health Behavior/Discharge Planning: Goal: Ability to manage health-related needs will improve Outcome: Not Progressing   

## 2017-05-24 NOTE — Progress Notes (Signed)
OT Cancellation Note  Patient Details Name: Jesse Le MRN: [id number] DOB: 11/25/1945   Cancelled Treatment:    Reason Eval/Treat Not Completed: Medical issues which prohibited therapy(Pt sedated and continues to have confusion. Will hold therapy.)  [place], OTR/L Acute Rehab Pager: [phone] Office: [phone] 05/24/2017, 9:28 AM

## 2017-05-25 ENCOUNTER — Inpatient Hospital Stay (HOSPITAL_COMMUNITY): Payer: Medicare Other

## 2017-05-25 LAB — GLUCOSE, CAPILLARY
GLUCOSE-CAPILLARY: 145 mg/dL — AB (ref 65–99)
GLUCOSE-CAPILLARY: 120 mg/dL — AB (ref 65–99)
GLUCOSE-CAPILLARY: 123 mg/dL — AB (ref 65–99)
GLUCOSE-CAPILLARY: 146 mg/dL — AB (ref 65–99)
Glucose-Capillary: 152 mg/dL — ABNORMAL HIGH (ref 65–99)
Glucose-Capillary: 140 mg/dL — ABNORMAL HIGH (ref 65–99)

## 2017-05-25 LAB — BASIC METABOLIC PANEL
Anion gap: 7 (ref 5–15)
BUN: 24 mg/dL — AB (ref 6–20)
CHLORIDE: 110 mmol/L (ref 101–111)
CO2: 21 mmol/L — ABNORMAL LOW (ref 22–32)
Calcium: 7.5 mg/dL — ABNORMAL LOW (ref 8.9–10.3)
Creatinine, Ser: 0.94 mg/dL (ref 0.61–1.24)
GFR calc Af Amer: >60 mL/min (ref >60)
GFR calc non Af Amer: >60 mL/min (ref >60)
Glucose, Bld: 154 mg/dL — ABNORMAL HIGH (ref 65–99)
POTASSIUM: 4.2 mmol/L (ref 3.5–5.1)
SODIUM: 138 mmol/L (ref 135–145)

## 2017-05-25 LAB — CBC
HCT: 22.4 % — ABNORMAL LOW (ref 39.0–52.0)
HEMOGLOBIN: 6.7 g/dL — AB (ref 13.0–17.0)
MCH: 27.7 pg (ref 26.0–34.0)
MCHC: 29.9 g/dL — ABNORMAL LOW (ref 30.0–36.0)
MCV: 92.6 fL (ref 78.0–100.0)
Platelets: 232 10*3/uL (ref 150–400)
RBC: 2.42 MIL/uL — AB (ref 4.22–5.81)
RDW: 31.7 % — ABNORMAL HIGH (ref 11.5–15.5)
WBC: 14.4 10*3/uL — AB (ref 4.0–10.5)

## 2017-05-25 LAB — PREPARE RBC (CROSSMATCH)

## 2017-05-25 LAB — PROTIME-INR
INR: 1.28
Prothrombin Time: 15.9 seconds — ABNORMAL HIGH (ref 11.4–15.2)

## 2017-05-25 MED ORDER — TRAVASOL 10 % IV SOLN
INTRAVENOUS | Status: AC
  Administered 2017-05-25: 19:00:00 via INTRAVENOUS
  Filled 2017-05-25: qty 1188

## 2017-05-25 MED ORDER — IOHEXOL 300 MG/ML  SOLN
100.0000 mL | Freq: Once | INTRAMUSCULAR | Status: AC | PRN
  Administered 2017-05-25: 100 mL via INTRAVENOUS

## 2017-05-25 MED ORDER — FUROSEMIDE 10 MG/ML IJ SOLN
INTRAMUSCULAR | Status: AC
  Administered 2017-05-25: 40 mg via INTRAVENOUS
  Filled 2017-05-25: qty 4

## 2017-05-25 MED ORDER — SODIUM CHLORIDE 0.9 % IV SOLN
Freq: Once | INTRAVENOUS | Status: AC
  Administered 2017-05-25: 16:00:00 via INTRAVENOUS

## 2017-05-25 MED ORDER — FUROSEMIDE 10 MG/ML IJ SOLN
40.0000 mg | Freq: Once | INTRAMUSCULAR | Status: AC
  Administered 2017-05-25: 40 mg via INTRAVENOUS

## 2017-05-25 MED ORDER — FUROSEMIDE 10 MG/ML IJ SOLN
40.0000 mg | Freq: Every day | INTRAMUSCULAR | Status: DC
  Administered 2017-05-25 – 2017-05-30 (×6): 40 mg via INTRAVENOUS
  Filled 2017-05-25 (×6): qty 4

## 2017-05-25 NOTE — Progress Notes (Signed)
MD paged. Verbal orders to restart bilateral wrist restraints for patient removing medical equipment and interfering with care.

## 2017-05-25 NOTE — Progress Notes (Signed)
PULMONARY / CRITICAL CARE MEDICINE   Name: Jesse Le MRN: [id number] DOB: 06-20-1945    ADMISSION DATE:  05/06/2017 CONSULTATION DATE:  05/18/2017  REFERRING MD:  Dr. Terrence Dupont  CHIEF COMPLAINT:  Hypotension  HISTORY OF PRESENT ILLNESS:   72 yo male presented with abdominal pain, weight loss and intermittent hematochezia.  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  Had low anterior resection with primary anastomosis with diverting loop ileostomy.  Developed hypotension, hypoglycemia 5/07.  Developed hypotension and altered mental status 5/08 and transferred to ICU. Pt has an anastomotic leak, drain placed by surgery PMHx of CAD, DM, TAA.  5/13 The patient is awake and tracking but not really verbal. BP is running a bit high. 170-180. Patient remains ion a small dose of precedex at 0.71mcg/kg. respiratotory status appears comfortable. Does not appear to be onpressors at this time.  5/14 Little overall change in status The patient rermains lethargic and non-communicative. BP has been high requiring nicardipine drip in addition to prn labetalol and hydralazine Discussed the situation with surgery. The patient had pulled out his RLQ drain. A new CT scan ihas been ordered and he may need drain replaced.  5/15 The patient is awake and up in the chair. He went down for a CT scan today. There was less extraluminal gas in the area of the anastomosis. There is increasing ascities. A new spontaneous right retroperitonela hematoma was noted as well. The patient remains hemodynamically stable. He is more awake and speaking to his girfriend.    VITAL SIGNS: BP (!) 150/69   Pulse 67   Temp (!) 96.3 F (35.7 C) (Axillary)   Resp 17   Ht 6' 0.99" (1.854 m)   Wt 252 lb 10.4 oz (114.6 kg)   SpO2 98%   BMI 33.34 kg/m   INTAKE / OUTPUT: I/O last 3 completed shifts: In: 6811.5 [I.V.:5766.5; Other:35; IV Piggyback:1010] Out: 7124 [Urine:4250;  Stool:135]  PHYSICAL EXAMINATION:  General -obese man, no distress Eyes -equal, reactive ENT -port intention, oropharynx clear Cardiac -regular, no murmur, mild pretibial edema Chest -few scattered bilateral inspiratory crackles Abd -ileostomy   JP drain wa s removed by the patient two days agoAbdominal binder.Abdomen appears more distended Ext - 2+ lower extremity edema Skin -no rash Neuro -awake, somewhat sleepy, Not really seaking at this time current agitation   LABS:  BMET Recent Labs  Lab 05/23/17 0356 05/24/17 0339 05/25/17 0400  NA 137 134* 138  K 4.6 5.1 4.2  CL 109 109 110  CO2 20* 20* 21*  BUN 22* 23* 24*  CREATININE 1.46* 1.03 0.94  GLUCOSE 256* 264* 154*    Electrolytes Recent Labs  Lab 05/22/17 0300 05/23/17 0356 05/24/17 0339 05/25/17 0400  CALCIUM 6.8* 7.3* 7.4* 7.5*  MG 2.1 2.2 2.1  --   PHOS 4.0 4.6 3.3  --     CBC Recent Labs  Lab 05/21/17 0428 05/23/17 0356 05/25/17 1222  WBC 12.4* 11.0* 14.4*  HGB 8.3* 8.6* 6.7*  HCT 26.4* 26.9* 22.4*  PLT 170 174 232    Coag's Recent Labs  Lab 05/19/17 0050  INR 1.60    Sepsis Markers Recent Labs  Lab 05/18/17 2133 05/19/17 0050 05/22/17 0300  LATICACIDVEN 3.4* 1.7 1.7    ABG Recent Labs  Lab 05/19/17 1615 05/20/17 0432  PHART 7.371 7.440  PCO2ART 29.4* 27.5*  PO2ART 79.1* 76.1*    Liver Enzymes Recent Labs  Lab 05/20/17 0348 05/23/17 0356  AST 85*  34  ALT 50 20  ALKPHOS 76 56  BILITOT 1.1 0.6  ALBUMIN 1.2* <1.0*    Cardiac Enzymes No results for input(s): TROPONINI, PROBNP in the last 168 hours.  Glucose Recent Labs  Lab 05/24/17 2004 05/24/17 2311 05/25/17 0409 05/25/17 0811 05/25/17 1230 05/25/17 1244  GLUCAP 136* 106* 145* 152* 120* 140*    Imaging Ct Abdomen Pelvis W Contrast  Addendum Date: 05/25/2017   ADDENDUM REPORT: 05/25/2017 11:47 ADDENDUM: The original report was by Dr. Van Clines. The following addendum is by Dr. Van Clines: Critical Value/emergent results were called by telephone at the time of interpretation on 05/25/2017 at 11:44 am to Dr. Gretta Arab, who verbally acknowledged these results. Electronically Signed   By: Van Clines M.D.   On: 05/25/2017 11:47   Result Date: 05/25/2017 CLINICAL DATA:  Postoperative infection. Abdominal tube was retracted forcefully. EXAM: CT ABDOMEN AND PELVIS WITH CONTRAST TECHNIQUE: Multidetector CT imaging of the abdomen and pelvis was performed using the standard protocol following bolus administration of intravenous contrast. CONTRAST:  154mL OMNIPAQUE IOHEXOL 300 MG/ML  SOLN COMPARISON:  05/19/2017 FINDINGS: Lower chest: Small right and trace left pleural effusions with passive atelectasis. There is some additional mild atelectasis in the right middle lobe. Mild cardiomegaly. Hepatobiliary: Motion artifact and streak artifact from arm positioning reduced sensitivity and specificity assessing the liver. At least 2 gallstones are present, the larger measuring 1.5 cm in diameter. No obvious hepatic abnormality observed observed. Pancreas: Unremarkable Spleen: Unremarkable Adrenals/Urinary Tract: Adrenal glands normal. Severely atrophic left kidney. Bilateral renal hypodense lesions are likely cysts, although 1 of these exophytic lesions on image 45/3 measuring 1.7 by 1.1 cm is mildly hyperdense and accordingly potentially a complex cyst or mass. This has roughly similar density characteristics to the precontrast exam of 05/19/2017 performed on the same CT scanner. A Foley catheter is present in the urinary bladder. Stomach/Bowel: Colorectal anastomosis is again observed. Currently there is only a trace amount of contained extraluminal gas tracking in the adjacent fascia plane, compared to the larger amount on 05/19/2017. However, there continues to be a considerable amount of free air in the belly, such that residual perforation is not excluded. Descending and sigmoid colon  diverticulosis. 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 The stent extends down into the common iliac arteries, excluding a small abdominal aortic aneurysm. There is tenting of the celiac trunk and SMA which appear patent, and stenting of the patent right renal artery. 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 Reproductive: The prostate gland has a slightly lobular contour. Other: Acute right retroperitoneal hemorrhage primarily along the lateral and anterior margin of the right psoas muscle and between the right psoas and iliacus muscles, new compared to 05/19/17. This has multiple components with volume of he hematoma estimated at 280 cubic cm. I do not see definite active extravasation of contrast medium. Laparotomy wound healing by secondary intention noted. Right-sided ostomy site noted. There is a considerable new amount of ascites especially in the perihepatic region, containing locules of free intraperitoneal gas. There is also some new collected ascites loculated anteriorly in the left upper quadrant, and scattered pelvic ascites. Presacral edema similar to prior. Worsened subcutaneous edema especially along the flanks and upper  thighs, and greater on the left than the right. Musculoskeletal: Thoracic and lumbar spondylosis with lumbar degenerative disc disease, and notable foraminal impingement bilaterally at L3-4, L4-5, and L5-S1. IMPRESSION: 1. Acute right retroperitoneal hemorrhage along the margin of the psoas and iliacus muscles is new compared to the 05/19/2017 exam, volume of hematoma approximately 280 cubic cm. No directly visualized active extravasation on today's exam. 2. Worsening ascites especially in  the perihepatic region, with free intraperitoneal gas and loculated gas within the ascites. However, the amount of mesenteric gas immediately adjacent to the ileo colic anastomosis has focally decreased compared to previous. 3. Small right and trace left pleural effusions with passive atelectasis. 4. Stable appearance of stent graft traversing thoracic and abdominal aortic aneurysms and extending into the iliac vessels. 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 5. Other imaging findings of potential clinical significance: Mild cardiomegaly. Cholelithiasis. Aortic Atherosclerosis (ICD10-I70.0). Aortic aneurysm NOS (ICD10-I71.9). Severely atrophic left kidney. Stable renal cystic lesions including a complex lesion of the right mid kidney which is most likely a complex cyst rather than a mass. Lobular prostate gland. Laparotomy wound healing by secondary intention. Right-sided ostomy. Worsened subcutaneous edema along the flanks and upper thighs. Multilevel lower lumbar foraminal impingement. Radiology assistant personnel have been notified to put me in telephone contact with the referring physician or the referring physician's clinical representative in order to discuss these findings. Once this communication is established I will issue an addendum to this report for documentation purposes. Electronically Signed: By: Van Clines M.D. On: 05/25/2017 11:20     STUDIES:  CT abd 4/26 >> 9 cm mass CT abd 5/09 >> discontinuity in the posterior wall just above the anastomosis with feculent appearing material and gas in the pelvis, measuring up to 5x 3 cm anterior and superior to the sigmoid colon.  Fluid collection right lower quadrant measuring up to 6.4 x 3.5 cm. Moderate pneumoperitoneum.  CULTURES: Blood culture 5/8 >  ANTIBIOTICS: Zosyn 5/7 > 5/8 Zosyn 5/10>> Unasyn 5/8 > 5/10 Vancomycin 5/7 > Eraxis 5/8 >  SIGNIFICANT EVENTS: 4/26 admit for abdominal pain. Mass identified on  CT 4/28 colonoscopy. Biopsy sent.  5/1 bx grows adeno. To OR for resection ileostomy 5/8 hypotension hypoglycemia AMS, transfer to ICU  LINES/TUBES: Lt IJ CVL  5/8 > JP Drain 5/1>  DISCUSSION: 72 yo male with sepsis from peritonitis after surgery for colon cancer and CT abd +  for anastomotic leak.>> Drain placed 5/1  ASSESSMENT / PLAN:  Sepsis with septic shock from peritonitis.  Restarted on norepinephrine, overall volume resuscitation is been 6-1/2 L over the last 3 days Colon cancer s/p low anterior resection with ileostomy. Continue current Zosyn, vancomycin, Eraxis JP drain in place appears to be draining purulent fluid IV fluid resuscitation, attempt to wean norepinephrine as able Restart stress dose steroids (had been weaning). It appears that the patient is doing better hemodynamically at this time.   Discussed the plan with surgery re new CT scan and possible new drain placement. The patient is hemodynamically stable. He has been off pressors for 2 days. Surgery spokme tpo me and they plan on asking IR to do a poaracetesis foir diagnostic and therapeutic purposes.  Systolic HTN BP has been hard to control. The patient is on a cardene drip at 10mg /hr. In addition there are prn orders for hydralazine and labetalol. Bp is better presently   Acute renal failure from ATN.   Oliguria improved slightly over last 24 hours  Renal  fn. Better Patient had better than 2200 liters out yesterday. Creatiine has trended down to 1.03 Renal fn. Is stable     DM type II. Hypoglycemia in setting of sepsis, continues to be a problem. Scheduled CBG Restart stress dose steroids, question some degree of adrenal insufficiency contributing to his hypoglycemia Replacing dextrose aggressively in his TPN, discussed with pharmacy on 5/12. Blood sugars are vacillatinmg quite a bit lately. May be up lately due to stress dose steroids.  Acute metabolic encephalopathy 2nd to sepsis.  Improving Follow mental status off of Precedex Minimize sedating medications Continue to reorient  Relative adrenal insufficiency. Hydrocortisone back to 50 mg every 6 hours. I have decreased hydrocort to q 12h.  CXR persisistent small opacity at the left base. No mjaor cough 02 sats on nasal cannula in the high 90s. DVT prophylaxis - lovenox SUP - pepcid Nutrition - NPO + TPN Goals of care - full code   Micheal Likens MD [facility]

## 2017-05-25 NOTE — Progress Notes (Signed)
Physical Therapy Treatment Patient Details Name: Jesse Le MRN: [id number] DOB: [birth year] Today's Date: 05/25/2017    History of Present Illness Pt is a 72 y.o. 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 PMH includes HTN, DM, COPD, aortic aneurysm, CKD II, obesity.     PT Comments    Pt very confused and even hallucinating. Pt declining functionally as well. Pt with initiate mobility however is very weak and requires maxAx2 for safe transfer OOB to chair. Pt brought self to EOB using bed rail, however impulsive and required modA to prevent from falling out of bed. Acute PT to con't to follow.   Follow Up Recommendations  SNF;Supervision/Assistance - 24 hour     Equipment Recommendations       Recommendations for Other Services       Precautions / Restrictions Precautions Precautions: Fall Precaution Comments: ileostomy bag, abdominal wound drainage; JP drain Restrictions Weight Bearing Restrictions: No    Mobility  Bed Mobility Overal bed mobility: Needs Assistance Bed Mobility: Rolling;Sidelying to Sit Rolling: Min guard Sidelying to sit: Min assist       General bed mobility comments: when PT asked patient to "roll towards me" pt quickly reached for railing and pulled self up to EOB and attempted to stand. Proved tactile and verbal cues to sit EOB and not attempt to stand at this time.   Transfers Overall transfer level: Needs assistance Equipment used: (2 person lift with bed pad) Transfers: Sit to/from [employer] Transfers Sit to Stand: Max assist;+2 physical assistance Stand pivot transfers: Max assist;+2 physical assistance       General transfer comment: pt continuously trying to initiate standing however is unable to power up to full upright standing. maxA to pvt to chair. Pt initiating but unable to sequence  stepping  Ambulation/Gait             General Gait Details: unable at this time   Stairs             Wheelchair Mobility    Modified Rankin (Stroke Patients Only)       Balance Overall balance assessment: Needs assistance Sitting-balance support: Feet supported;Bilateral upper extremity supported Sitting balance-[name]: Fair       Standing balance-[name]: Poor Standing balance comment: Reliant on UE support; easily fatigued standing for pericare                             Cognition Arousal/Alertness: Awake/alert Behavior During Therapy: Restless Overall Cognitive Status: History of cognitive impairments - at baseline                                 General Comments: pt inconsistently follows commands, pt able to state last name x1 trial. pt not orients, hallucinating and very confused. Pt also impulsive requiring restraints for safety      Exercises      General Comments        Pertinent Vitals/Pain Pain Assessment: Faces Faces Pain Scale: No hurt    Home Living                      Prior Function            PT Goals (current goals can now be found in the care plan section)  Progress towards PT goals: Progressing toward goals    Frequency    Min 2X/week      PT Plan Current plan remains appropriate    Co-evaluation              AM-PAC PT "6 Clicks" Daily Activity  Outcome Measure  Difficulty turning over in bed (including adjusting bedclothes, sheets and blankets)?: Unable Difficulty moving from lying on back to sitting on the side of the bed? : Unable Difficulty sitting down on and standing up from a chair with arms (e.g., wheelchair, bedside commode, etc,.)?: Unable Help needed moving to and from a bed to chair (including a wheelchair)?: Total Help needed walking in hospital room?: Total Help needed climbing 3-5 steps with a railing? : Total 6 Click Score: 6    End of Session    Activity Tolerance: Patient limited by fatigue Patient left: in chair;with chair alarm set;with nursing/sitter in room Nurse Communication: Mobility status PT Visit Diagnosis: Other abnormalities of gait and mobility (R26.89)     Time: 1040-1101 PT Time Calculation (min) (ACUTE ONLY): 21 min  Charges:  $Therapeutic Activity: 8-22 mins                    G Codes:       Kittie Plater, PT, DPT Pager #: [phone] Office #: [phone]    [place] 05/25/2017, 1:05 PM

## 2017-05-25 NOTE — Consult Note (Signed)
Chief Complaint: Patient was seen in consultation today for perihepatic fluid collection aspiration/drain placement at the request of Dr Evlyn Courier   Supervising Physician: Sandi Mariscal  Patient Status: Jesse Le - In-pt  History of Present Illness: Jesse Le is a 72 y.o. male   Per [facility] note 8/29: Reducible umbilical hernia Rectosigmoid colon mass- S/p LAR with primary colorectal (EE)anastomosis and diverting loop ileostomy with umbilical hernia repair 09/13/69 Dr. Georgette Dover -Pathology shows adenoCA, 0/15 LNs - POD#14  CT today :  IMPRESSION: 1. Acute right retroperitoneal hemorrhage along the margin of the psoas and iliacus muscles is new compared to the 05/19/2017 exam, volume of hematoma approximately 280 cubic cm. No directly visualized active extravasation on today's exam. 2. Worsening ascites especially in the perihepatic region, with free intraperitoneal gas and loculated gas within the ascites. However, the amount of mesenteric gas immediately adjacent to the ileo colic anastomosis has focally decreased compared to previous.  Request made for aspiration/drain placement of perihepatic fluid collection Dr Pascal Lux has reviewed imaging and approves procedure  Hg 6.7 2 units PRBCs going now  Past Medical History:  Diagnosis Date  . Aneurysm of aorta (HCC)   . Coronary artery disease   . Diabetes mellitus without complication ([place])    NEW ONSET 09/2012  . Hypertension   . Myocardial infarct [facility])     Past Surgical History:  Procedure Laterality Date  . ABDOMINAL AORTIC ANEURYSM REPAIR  10-14-11   TEVAR  . COLON RESECTION SIGMOID N/A 05/11/2017   Procedure: Colon Resection Sigmoid Low Anterior Resection with Staple Colon Relast;  Surgeon: Donnie Mesa, MD;  Location: [place];  Service: General;  Laterality: N/A;  . COLONOSCOPY WITH PROPOFOL N/A 05/08/2017   Procedure: COLONOSCOPY WITH PROPOFOL;  Surgeon: Jerene Bears, MD;  Location: [place];  Service:  Gastroenterology;  Laterality: N/A;  . ILEOSTOMY N/A 05/11/2017   Procedure: Creation Vertical Loop ILEOSTOMY;  Surgeon: Donnie Mesa, MD;  Location: [place];  Service: General;  Laterality: N/A;  . INTRAVASCULAR ULTRASOUND  10/14/2011   Procedure: INTRAVASCULAR ULTRASOUND;  Surgeon: Serafina Mitchell, MD;  Location: [place];  Service: Vascular;  Laterality: N/A;  . UMBILICAL HERNIA REPAIR N/A 05/11/2017   Procedure: HERNIA REPAIR UMBILICAL ADULT;  Surgeon: Donnie Mesa, MD;  Location: [place];  Service: General;  Laterality: N/A;  . VIDEO BRONCHOSCOPY  10/14/2011   Procedure: VIDEO BRONCHOSCOPY;  Surgeon: Serafina Mitchell, MD;  Location: [facility];  Service: Vascular;  Laterality: N/A;    Allergies: Patient has no known allergies.  Medications: Prior to Admission medications   Medication Sig Start Date End Date Taking? Authorizing Provider  aspirin EC 81 MG tablet Take 81 mg by mouth daily.   Yes [provider]  metFORMIN (GLUCOPHAGE) 500 MG tablet Take 1 tablet (500 mg total) by mouth 2 (two) times daily with a meal. 10/13/12  Yes Charolette Forward, MD  metoprolol tartrate (LOPRESSOR) 50 MG tablet Take 50 mg by mouth 2 (two) times daily.   Yes [provider]  glimepiride (AMARYL) 2 MG tablet Take 1 tablet (2 mg total) by mouth daily with breakfast. Patient not taking: Reported on 05/06/2017 10/13/12   Charolette Forward, MD     History reviewed. No pertinent family history.  Social History   Socioeconomic History  . Marital status: Married    Spouse name: Not on file  . Number of children: Not on file  . Years of education: Not on file  . Highest education level: Not  on file  Occupational History  . Not on file  Social Needs  . Financial resource strain: Not on file  . Food insecurity:    Worry: Not on file    Inability: Not on file  . Transportation needs:    Medical: Not on file    Non-medical: Not on file  Tobacco Use  . Smoking status: Former Smoker    Packs/day: 0.50      Types: Cigarettes    Last attempt to quit: 07/20/2011    Years since quitting: 5.8  . Smokeless tobacco: Never Used  Substance and Sexual Activity  . Alcohol use: Yes    Alcohol/week: 0.6 oz    Types: 1 Shots of liquor per week    Comment: weekends  . Drug use: No  . Sexual activity: Not on file  Lifestyle  . Physical activity:    Days per week: Not on file    Minutes per session: Not on file  . Stress: Not on file  Relationships  . Social connections:    Talks on phone: Not on file    Gets together: Not on file    Attends religious service: Not on file    Active member of club or organization: Not on file    Attends meetings of clubs or organizations: Not on file    Relationship status: Not on file  Other Topics Concern  . Not on file  Social History Narrative  . Not on file    Review of Systems: A 12 point ROS discussed and pertinent positives are indicated in the HPI above.  All other systems are negative.  Review of Systems  Constitutional: Positive for activity change.  Gastrointestinal: Positive for abdominal distention, abdominal pain and nausea.  Neurological: Positive for weakness.  Psychiatric/Behavioral: Positive for decreased concentration.    Vital Signs: BP (!) 150/69   Pulse 70   Temp (!) 96.3 F (35.7 C) (Axillary)   Resp 19   Ht 6' 0.99" (1.854 m)   Wt 252 lb 10.4 oz (114.6 kg)   SpO2 98%   BMI 33.34 kg/m   Physical Exam  Cardiovascular: Normal rate.  Pulmonary/Chest: Effort normal. He has wheezes.  Abdominal: He exhibits distension. There is tenderness.  Skin: Skin is warm and dry.  Psychiatric:  Consented with POA at bedside  Nursing note and vitals reviewed.   Imaging: Ct Abdomen Pelvis Wo Contrast  Result Date: 05/19/2017 CLINICAL DATA:  Uncontrolled fistula. EXAM: CT ABDOMEN AND PELVIS WITHOUT CONTRAST TECHNIQUE: Multidetector CT imaging of the abdomen and pelvis was performed following the standard protocol without IV contrast.  COMPARISON:  12/26/2012 FINDINGS: Lower chest: Stented thoracic aortic aneurysm. Lower lobe atelectasis. Hepatobiliary: No focal liver abnormality.Cholelithiasis. No signs of cholecystitis. Pancreas: Unremarkable. Spleen: Unremarkable. Adrenals/Urinary Tract: Negative adrenals. Severe left renal atrophy, likely from old vascular injury. Left renal cystic density. Right renal isodense but stable from 2014 lesion compatible with cyst. The bladder is decompressed by Foley catheter. Stomach/Bowel: Status post low anterior resection. There is a visible discontinuity in the posterior wall just above the anastomosis with feculent appearing material and gas in the pelvis, measuring up to 5x 3 cm anterior and superior to the sigmoid colon. Oral contrast has reached the proximal small bowel only. Mild generalized fluid filling of small bowel. No obstructive process is seen. Negative appendix. Stranding around the ascending colon is likely postoperative. Fluid collection right lower quadrant measuring up to 6.4 x 3.5 cm. Moderate pneumoperitoneum. Vascular/Lymphatic: Aortoiliac stent grafting with  multiple visceral stents. No mass or adenopathy. Reproductive:Negative Other: Low abdominal wall incision is open and packed. No undermining or collection. Musculoskeletal: Extensive degenerative changes. Multilevel bridging osteophyte. No acute osseous finding. A call is being placed to the ordering provider. IMPRESSION: 1. Visible 6 mm defect in the posterior colon wall just above the colocolonic anastomosis with feculent material tracking superiorly and anteriorly. The largest gas and feculent collection measures 5 x 3 cm superior to the sigmoid colon. The surgical drain is in continuity with the collection. 2. Likely contiguous 6 x 4 cm fluid collection in the right lower quadrant with mesenteric fat edema. 3. Moderate pneumoperitoneum. 4. Oral contrast only reached the proximal jejunum. Probable mild ileus. 5. Open low abdominal  wall with packing. No undermining or subcutaneous collection. 6. Cholelithiasis Electronically Signed   By: Monte Fantasia M.D.   On: 05/19/2017 07:12   Dg Abd 1 View  Result Date: 05/16/2017 CLINICAL DATA:  Ileus, nausea and vomiting, mid abdominal pain. EXAM: ABDOMEN - 1 VIEW COMPARISON:  Lumbar spine series of September 19, 2012 and CT scan of the abdomen and pelvis of November 22, 2011 FINDINGS: There is an aorto bi-iliac stent graft in place. There is an internal iliac graft on the left as well. The bowel gas pattern is normal. A surgical drainage tube is present on the left with the tip terminating over the mid to upper portion of the pelvis. The stool burden within the rectum is normal. IMPRESSION: There is exclusion of the flanks. The bowel gas pattern however does not suggest significant obstruction. A surgical drainage tube is present within the pelvis. There is a short segment of tubular structure overlying the lateral aspect of the right iliac bone that is of uncertain significance. Electronically Signed   By: David  [name] M.D.   On: 05/16/2017 13:24   Ct Abdomen Pelvis W Contrast  Addendum Date: 05/25/2017   ADDENDUM REPORT: 05/25/2017 11:47 ADDENDUM: The original report was by Dr. Van Clines. The following addendum is by Dr. Van Clines: Critical Value/emergent results were called by telephone at the time of interpretation on 05/25/2017 at 11:44 am to Dr. Gretta Arab, who verbally acknowledged these results. Electronically Signed   By: Van Clines M.D.   On: 05/25/2017 11:47   Result Date: 05/25/2017 CLINICAL DATA:  Postoperative infection. Abdominal tube was retracted forcefully. EXAM: CT ABDOMEN AND PELVIS WITH CONTRAST TECHNIQUE: Multidetector CT imaging of the abdomen and pelvis was performed using the standard protocol following bolus administration of intravenous contrast. CONTRAST:  168mL OMNIPAQUE IOHEXOL 300 MG/ML  SOLN COMPARISON:  05/19/2017 FINDINGS: Lower chest:  Small right and trace left pleural effusions with passive atelectasis. There is some additional mild atelectasis in the right middle lobe. Mild cardiomegaly. Hepatobiliary: Motion artifact and streak artifact from arm positioning reduced sensitivity and specificity assessing the liver. At least 2 gallstones are present, the larger measuring 1.5 cm in diameter. No obvious hepatic abnormality observed observed. Pancreas: Unremarkable Spleen: Unremarkable Adrenals/Urinary Tract: Adrenal glands normal. Severely atrophic left kidney. Bilateral renal hypodense lesions are likely cysts, although 1 of these exophytic lesions on image 45/3 measuring 1.7 by 1.1 cm is mildly hyperdense and accordingly potentially a complex cyst or mass. This has roughly similar density characteristics to the precontrast exam of 05/19/2017 performed on the same CT scanner. A Foley catheter is present in the urinary bladder. Stomach/Bowel: Colorectal anastomosis is again observed. Currently there is only a trace amount of contained extraluminal gas tracking in the adjacent fascia  plane, compared to the larger amount on 05/19/2017. However, there continues to be a considerable amount of free air in the belly, such that residual perforation is not excluded. Descending and sigmoid colon diverticulosis. 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 The stent extends down into the common iliac arteries, excluding a small abdominal aortic aneurysm. There is tenting of the celiac trunk and SMA which appear patent, and stenting of the patent right renal artery. 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 Reproductive: The prostate gland has a slightly lobular  contour. Other: Acute right retroperitoneal hemorrhage primarily along the lateral and anterior margin of the right psoas muscle and between the right psoas and iliacus muscles, new compared to 05/19/17. This has multiple components with volume of he hematoma estimated at 280 cubic cm. I do not see definite active extravasation of contrast medium. Laparotomy wound healing by secondary intention noted. Right-sided ostomy site noted. There is a considerable new amount of ascites especially in the perihepatic region, containing locules of free intraperitoneal gas. There is also some new collected ascites loculated anteriorly in the left upper quadrant, and scattered pelvic ascites. Presacral edema similar to prior. Worsened subcutaneous edema especially along the flanks and upper thighs, and greater on the left than the right. Musculoskeletal: Thoracic and lumbar spondylosis with lumbar degenerative disc disease, and notable foraminal impingement bilaterally at L3-4, L4-5, and L5-S1. IMPRESSION: 1. Acute right retroperitoneal hemorrhage along the margin of the psoas and iliacus muscles is new compared to the 05/19/2017 exam, volume of hematoma approximately 280 cubic cm. No directly visualized active extravasation on today's exam. 2. Worsening ascites especially in the perihepatic region, with free intraperitoneal gas and loculated gas within the ascites. However, the amount of mesenteric gas immediately adjacent to the ileo colic anastomosis has focally decreased compared to previous. 3. Small right and trace left pleural effusions with passive atelectasis. 4. Stable appearance of stent graft traversing thoracic and abdominal aortic aneurysms and extending into the iliac vessels. 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 5. Other imaging findings of potential clinical significance: Mild cardiomegaly. Cholelithiasis. Aortic Atherosclerosis (ICD10-I70.0). Aortic aneurysm NOS (ICD10-I71.9). Severely  atrophic left kidney. Stable renal cystic lesions including a complex lesion of the right mid kidney which is most likely a complex cyst rather than a mass. Lobular prostate gland. Laparotomy wound healing by secondary intention. Right-sided ostomy. Worsened subcutaneous edema along the flanks and upper thighs. Multilevel lower lumbar foraminal impingement. Radiology assistant personnel have been notified to put me in telephone contact with the referring physician or the referring physician's clinical representative in order to discuss these findings. Once this communication is established I will issue an addendum to this report for documentation purposes. Electronically Signed: By: Van Clines M.D. On: 05/25/2017 11:20   Dg Chest Port 1 View  Result Date: 05/21/2017 CLINICAL DATA:  Respiratory failure EXAM: PORTABLE CHEST 1 VIEW COMPARISON:  May 18, 2017 FINDINGS: There is a graft within the aorta. The cardiomediastinal silhouette is stable. The right lung is clear. A new right PICC line is difficult to see centrally but appears to terminate in the central SVC. Opacity remains in the left base, similar in the interval. No other changes. IMPRESSION: 1. New right  PICC line. The distal tip is difficult to visualize with certainty but I believe it terminates in the central SVC. Recommend attention on follow-up. 2. Persistent opacity in the left base, similar in the interval. 3. No other changes. Electronically Signed   By: Dorise Bullion III M.D   On: 05/21/2017 07:58   Dg Chest Port 1 View  Result Date: 05/19/2017 CLINICAL DATA:  72 year old male with left-sided line placement. EXAM: PORTABLE CHEST 1 VIEW COMPARISON:  Chest CT dated 05/06/2017 FINDINGS: Left IJ central line with tip over mid SVC. No pneumothorax. Left lung base atelectatic changes versus infiltrate. The right lung is clear. No pleural effusion or pneumothorax. An endovascular stent graft repair of the aorta noted. IMPRESSION: 1. Left IJ  central line with tip over mid SVC.  No pneumothorax. 2. Left lung base atelectasis/infiltrate. Electronically Signed   By: Anner Crete M.D.   On: 05/19/2017 00:13   Ct Angio Chest/abd/pel For Dissection W And/or Wo Contrast  Result Date: 05/06/2017 CLINICAL DATA:  History of thoracic aortic aneurysm and dissection. Gastrointestinal bleeding. EXAM: CT ANGIOGRAPHY CHEST, ABDOMEN AND PELVIS TECHNIQUE: Multidetector CT imaging through the chest, abdomen and pelvis was performed using the standard protocol during bolus administration of intravenous contrast. Multiplanar reconstructed images and MIPs were obtained and reviewed to evaluate the vascular anatomy. CONTRAST:  19mL ISOVUE-370 IOPAMIDOL (ISOVUE-370) INJECTION 76% COMPARISON:  CT scans of November 22, 2011 and October 12, 2011. FINDINGS: CTA CHEST FINDINGS Cardiovascular: 4.1 cm ascending thoracic aortic aneurysm is noted. Great vessels are widely patent without significant stenosis. Status post stent graft placement beginning in transverse aortic arch distal to the origin of left subclavian artery, that extends through 7.5 cm distal descending thoracic aortic aneurysm into abdominal aorta. Excluded aneurysmal sac measures 9 cm. Normal cardiac size. No pericardial effusion is noted. Mediastinum/Nodes: No enlarged mediastinal, hilar, or axillary lymph nodes. Thyroid gland, trachea, and esophagus demonstrate no significant findings. Lungs/Pleura: No pneumothorax or pleural effusion is noted. Mild emphysematous disease is noted in the upper lobes bilaterally. Minimal left lower lobe subsegmental atelectasis is noted. 4 mm nodule is noted in left lower lobe best seen on image number 80 of series 7. Musculoskeletal: No chest wall abnormality. No acute or significant osseous findings. Review of the MIP images confirms the above findings. CTA ABDOMEN AND PELVIS FINDINGS VASCULAR Aorta: Status post stent graft repair of abdominal aortic aneurysm. No endoleak is  noted. Graft and limbs are widely patent. Excluded aneurysmal sac of infrarenal abdominal aorta has maximum measured transverse diameter of 4.5 cm. Celiac: Stent is noted in proximal portion which is widely patent. SMA: Stent is noted in proximal portion which is widely patent. Renals: Stent is noted in proximal portion of right renal artery which appears to be patent. 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 IMA: Occluded proximally, with reconstitution of its more distal portion through collaterals. Inflow: Stent graft extends into right common iliac artery which is widely patent. Stent graft extends into left external iliac artery, with separate stent extending into left internal iliac artery. These are patent. 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 Small focal dissection is noted in proximal portion of left external iliac artery. Veins: No obvious venous abnormality within the limitations of this arterial phase study. Review of the MIP images confirms the above findings. NON-VASCULAR Hepatobiliary: Cholelithiasis is noted without inflammation. No definite liver abnormality is noted. No biliary dilatation is noted. Pancreas: Unremarkable. No pancreatic  ductal dilatation or surrounding inflammatory changes. Spleen: Normal in size without focal abnormality. Adrenals/Urinary Tract: Adrenal glands are unremarkable. Stable exophytic cyst is seen arising from midpole of right kidney. 2.3 cm low density is noted in lower pole of right kidney which is increased in size compared to prior exam. It demonstrates average Hounsfield measurement of 23. Severe left renal atrophy is noted with associated left renal cyst. No hydronephrosis or renal obstruction is noted. Urinary bladder is unremarkable. Stomach/Bowel: The stomach appears normal. The appendix appears normal. Diverticulosis of descending colon is noted. Large eccentric soft tissue abnormality is seen  involving the sigmoid colon which measures 9 x 5 cm. This is concerning for neoplasm or malignancy, although diverticulitis cannot be excluded. Lymphatic: No significant adenopathy is noted. Reproductive: Prostate is unremarkable. Other: Moderate size fat containing periumbilical hernia is noted. Musculoskeletal: No acute or significant osseous findings. Review of the MIP images confirms the above findings. IMPRESSION: 9 x 5 cm eccentric soft tissue abnormality seen involving the sigmoid colon concerning for neoplasm or malignancy, although diverticulitis cannot be excluded. Sigmoidoscopy is recommended for further evaluation. 2.3 cm low density is noted in lower pole of right kidney which is increased in size compared to prior exam. Further evaluation with ultrasound is recommended evaluate for cyst versus neoplasm. 4.1 cm ascending thoracic aortic aneurysm. Status post stent graft placement involving thoracic aortic arch and extending through descending thoracic aorta into abdominal aorta. The graft is widely patent without evidence of endoleak. Patent stents are seen in both iliac arteries. 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 Small focal dissection is noted in proximal left external iliac artery. 7.5 cm excluded aneurysmal sac is noted in distal descending thoracic aorta without evidence of endoleak. 4 mm nodule seen in left lower lobe. No follow-up needed if patient is low-risk. Non-contrast chest CT can be considered in 12 months if patient is high-risk. This recommendation follows the consensus statement: Guidelines for Management of Incidental Pulmonary Nodules Detected on CT Images: From the Fleischner Society 2017; Radiology 2017; 284:228-243. Excluded aneurysmal sac infrarenal abdominal aorta has maximum measured diameter 4.5 cm. Patent stents are noted in the celiac, superior mesenteric and right renal arteries. 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 Moderate size fat containing periumbilical hernia. Aortic Atherosclerosis (ICD10-I70.0) and Emphysema (ICD10-J43.9). Electronically Signed   By: Marijo Conception, M.D.   On: 05/06/2017 14:23   [place] Ekg Site Rite  Result Date: 05/19/2017 If Site Rite image not attached, placement could not be confirmed due to current cardiac rhythm.   Labs:  CBC: Recent Labs    05/20/17 0348 05/21/17 0428 05/23/17 0356 05/25/17 1222  WBC 7.7 12.4* 11.0* 14.4*  HGB 10.8* 8.3* 8.6* 6.7*  HCT 34.8* 26.4* 26.9* 22.4*  PLT 276 170 174 232    COAGS: Recent Labs    05/19/17 0050  INR 1.60    BMP: Recent Labs    05/22/17 0300 05/23/17 0356 05/24/17 0339 05/25/17 0400  NA 134* 137 134* 138  K 4.3 4.6 5.1 4.2  CL 108 109 109 110  CO2 19* 20* 20* 21*  GLUCOSE 80 256* 264* 154*  BUN 21* 22* 23* 24*  CALCIUM 6.8* 7.3* 7.4* 7.5*  CREATININE 1.67* 1.46* 1.03 0.94  GFRNONAA 39* 46* >60 >60  GFRAA 46* 54* >60 >60    LIVER FUNCTION TESTS: Recent Labs    05/06/17  1036 05/07/17 0332 05/20/17 0348 05/23/17 0356  BILITOT 0.8 1.2 1.1 0.6  AST 19 14* 85* 34  ALT 9* 7* 50 20  ALKPHOS 50 44 76 56  PROT 7.4 6.7 4.8* 4.2*  ALBUMIN 2.4* 2.2* 1.2* <1.0*    TUMOR MARKERS: No results for input(s): AFPTM, CEA, CA199, CHROMGRNA in the last 8760 hours.  Assessment and Plan:  Abdominal surgery Post op 14 days Perihepatic fluid collection For aspiration/drain placement Risks and benefits discussed with the patient's family including bleeding, infection, damage to adjacent structures, bowel perforation/fistula connection, and sepsis.  All of the POA questions were answered, she is agreeable to proceed. Consent signed and in chart.    Thank you for this interesting consult.  I greatly enjoyed meeting Jeramey Lanuza and look forward to participating in their care.  A copy of this report was sent to the requesting  provider on this date.  Electronically Signed: Lavonia Drafts, PA-C 05/25/2017, 2:23 PM   I spent a total of 40 Minutes    in face to face in clinical consultation, greater than 50% of which was counseling/coordinating care for perihepatic fluid collection aspiration/drain

## 2017-05-25 NOTE — Progress Notes (Signed)
Nonie Hoyer PA, called this RN to notify pt's IR procedure will be move to tomorrow AM.

## 2017-05-25 NOTE — Progress Notes (Signed)
CRITICAL VALUE ALERT  Critical Value:  Hgb 6.7  Date & Time Notied:  3007  Provider Notified: [id number]  Orders Received/Actions taken: Orders to be placed for PRBC

## 2017-05-25 NOTE — Progress Notes (Signed)
14 Days Post-Op   Subjective/Chief Complaint: Remains confused   Objective: Vital signs in last 24 hours: Temp:  [96 F (35.6 C)-96.5 F (35.8 C)] 96.2 F (35.7 C) (05/15 0400) Pulse Rate:  [47-84] 79 (05/15 0600) Resp:  [8-27] 23 (05/15 0600) BP: (150)/(69) 150/69 (05/14 0902) SpO2:  [93 %-99 %] 96 % (05/15 0600) Arterial Line BP: (113-193)/(37-62) 187/58 (05/15 0600) Last BM Date: 05/24/17  Intake/Output from previous day: 05/14 0701 - 05/15 0700 In: 4194.9 [I.V.:3664.9; IV Piggyback:530] Out: 3010 [Urine:3000; Stool:10] Intake/Output this shift: No intake/output data recorded.  Exam: Awake and alert, confused Abdomen soft, open midline wound with necrosis, fascial dehiscence.  No bowel visible  Lab Results:  Recent Labs    05/23/17 0356  WBC 11.0*  HGB 8.6*  HCT 26.9*  PLT 174   BMET Recent Labs    05/24/17 0339 05/25/17 0400  NA 134* 138  K 5.1 4.2  CL 109 110  CO2 20* 21*  GLUCOSE 264* 154*  BUN 23* 24*  CREATININE 1.03 0.94  CALCIUM 7.4* 7.5*   PT/INR No results for input(s): LABPROT, INR in the last 72 hours. ABG No results for input(s): PHART, HCO3 in the last 72 hours.  Invalid input(s): PCO2, PO2  Studies/Results: No results found.  Anti-infectives: Anti-infectives (From admission, onward)   Start     Dose/Rate Route Frequency Ordered Stop   05/20/17 2230  vancomycin (VANCOCIN) 1,250 mg in sodium chloride 0.9 % 250 mL IVPB     1,250 mg 166.7 mL/hr over 90 Minutes Intravenous Every 24 hours 05/20/17 2151     05/20/17 1400  piperacillin-tazobactam (ZOSYN) IVPB 3.375 g     3.375 g 12.5 mL/hr over 240 Minutes Intravenous Every 8 hours 05/20/17 1044     05/20/17 0500  anidulafungin (ERAXIS) 100 mg in sodium chloride 0.9 % 100 mL IVPB     100 mg 78 mL/hr over 100 Minutes Intravenous Every 24 hours 05/19/17 0116     05/19/17 2200  vancomycin (VANCOCIN) 1,500 mg in sodium chloride 0.9 % 500 mL IVPB  Status:  Discontinued     1,500 mg 250  mL/hr over 120 Minutes Intravenous Every 24 hours 05/18/17 2126 05/20/17 2150   05/19/17 0130  anidulafungin (ERAXIS) 200 mg in sodium chloride 0.9 % 200 mL IVPB     200 mg 78 mL/hr over 200 Minutes Intravenous  Once 05/19/17 0116 05/19/17 0520   05/19/17 0130  Ampicillin-Sulbactam (UNASYN) 3 g in sodium chloride 0.9 % 100 mL IVPB  Status:  Discontinued     3 g 200 mL/hr over 30 Minutes Intravenous Every 8 hours 05/19/17 0118 05/20/17 1044   05/18/17 2200  vancomycin (VANCOCIN) 1,750 mg in sodium chloride 0.9 % 500 mL IVPB     1,750 mg 250 mL/hr over 120 Minutes Intravenous  Once 05/18/17 2126 05/19/17 0400   05/17/17 0930  piperacillin-tazobactam (ZOSYN) IVPB 3.375 g  Status:  Discontinued     3.375 g 12.5 mL/hr over 240 Minutes Intravenous Every 8 hours 05/17/17 0836 05/19/17 0118   05/11/17 2200  cefoTEtan (CEFOTAN) 2 g in sodium chloride 0.9 % 100 mL IVPB     2 g 200 mL/hr over 30 Minutes Intravenous Every 12 hours 05/11/17 1839 05/11/17 2034   05/11/17 0600  cefoTEtan in Dextrose 5% (CEFOTAN) IVPB 2 g     2 g Intravenous On call to O.R. 05/10/17 1142 05/11/17 1416      Assessment/Plan: s/p Procedure(s): Colon Resection Sigmoid Low Anterior Resection  with Staple Colon Relast (N/A) HERNIA REPAIR UMBILICAL ADULT (N/A) Creation Vertical Loop ILEOSTOMY (N/A)   LOS: 19 days   Continue wound care and TNA Repeat CT of the abdomen and pelvis today to assess abscess  Jesse Le A 05/25/2017

## 2017-05-25 NOTE — Progress Notes (Signed)
PT suddenly combative, swinging arms at staff, grabbing at this RN and equipment. Pt not following commands, yelling for staff to leave him allow. Pt began removing medical equipment. Unable to redirect or distract pt which has been successful today until now. Precedex gtt increased, and dilaudid PRN given. Pt still restless, moaning and attempting to remove medical devices.  Bilateral Mittens placed back onto patient. MD being paged for soft wrist restaints.

## 2017-05-25 NOTE — Progress Notes (Signed)
CT scan results noted.  Spontaneous RTP hematoma noted.  Lovenox DC.  Minimal free fluid in the pelvis.  Some fluid with air present up over the liver.  Will see if this can be drained.  Otherwise, stable intra-abdominal findings.  Jesse Le 12:38 PM 05/25/2017

## 2017-05-25 NOTE — Care Management Note (Signed)
Case Management Note Marvetta Gibbons RN,BSN [facility]22 Case Manager  [phone]  Patient Details  Name: Jesse Le MRN: [id number] Date of Birth: Sep 10, 1945  Subjective/Objective:  Pt admitted with sigmoid cancer s/p Colon Resection Sigmoid Low Anterior Resection with Staple Colon Relast HERNIA REPAIR UMBILICAL ADULT and Creation Vertical Loop ILEOSTOMY               Action/Plan: PTA pt lived at home with SO- per PT eval recommendation for SNF- CSW to follow for placement needs.   Expected Discharge Date:                  Expected Discharge Plan:  Skilled Nursing Facility  In-House Referral:  Clinical Social Work  Discharge planning Services  CM Consult  Post Acute Care Choice:    Choice offered to:     DME Arranged:    DME Agency:     HH Arranged:    [place] Agency:     Status of Service:  In process, will continue to follow  If discussed at Long Length of Stay Meetings, dates discussed:    Discharge Disposition:   Additional Comments:  Dawayne Patricia, RN 05/25/2017, 10:57 AM

## 2017-05-25 NOTE — Progress Notes (Signed)
PHARMACY - ADULT TOTAL PARENTERAL NUTRITION CONSULT NOTE   Pharmacy Consult:  TPN Indication: Prolonged ileus  Patient Measurements: Height: 6' 0.99" (185.4 cm) Weight: 252 lb 10.4 oz (114.6 kg) IBW/kg (Calculated) : 79.88 TPN AdjBW (KG): 94.5 Body mass index is 33.34 kg/m.  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  Developed anastomotic leak with feculent output in JP drain. Patient currently with high ileostomy output. Patient's abdomen has become more distended and patient was made NPO. Pharmacy consulted for TPN.   Patient was on clear liquids (on and off NPO as well) but only tolerating minimal intake (<25%) for >10 days. Patient is at risk for refeeding.   GI: Ileostomy O/P 53mL.  Pre-albumin <5.  Pepcid in TPN.  Possible return to OR for washout of abdomen and drain replacement.  Endo: no hx DM - hypoglycemic prior to TPN.  CBGs now controlled with less CHO and lipid in TPN.  HC for possible AI - weaned to 50mg  IV BID Insulin requirements in the past 24 hours: 30 units SSI Lytes: all WNL except slightly low CO2 Renal: SCr down to 0.94, BUN 24 - UOP 1.1 ml/kg/hr Pulm: stable on RA Cards: BP elevated, HR low-low normal - nicardipine gtt, IV metoprolol, Lasix 40mg  IV yesterday, PRN hydralazine Hepatobil: LFTs/AlkPhos/Tbili/TG wnl.  Neuro: Precedex gtt, Oxycodone, PRN Dilaudid - RASS 1  ID: [email or web address]/[email or web address]/[email or web address] for abd wound infxn. Wound re-opened and packed on 5/12.  Afebrile, WBC 11 TPN Access: CVC triple lumen placed 05/18/17 TPN start date: 05/20/17  Nutritional Goals (per RD rec on 5/9): 2100-2400 kCal, 105-120g protein, 2.1- 2.4 L per day  Goal TPN rate is 90 ml/hr (provides 100% of needs) -In order to help glucose, non-lipid  days: 55.5g/L AA + 23.4% dextrose versus lipid days: 55.5g/L AA, 16.4% dextrose, and 23.8g/L fat (only doing lipids 2 x week to keep <100g/week while in ICU for now) for total kcal ~2200 per daily  Current Nutrition:  TPN   Plan:  Continue TPN at goal rate of 90 ml/hr.  Giving ILE twice weekly during the first 7 days of TPN due to high risk for EFAD - none today and tomorrow, start daily lipid on 05/27/17 TPN provides 119g AA and 497g CHO, which provides 2164 kCal per day, meeting 100% of needs Electrolytes in TPN: increase Na and decrease K on 5/14, change to Cl:Ac 1:1 Daily multivitamin and trace elements in TPN Continue resistant SSI Q4H + 15 units regular insulin in TPN Pepcid 40mg  daily in TPN F/U AM labs, Onc plans, repeat CT, CBGs   Jesse Le D. Mina Marble, PharmD, BCPS, BCCCP Pager:  [phone] 05/25/2017, 7:52 AM

## 2017-05-25 NOTE — Progress Notes (Signed)
CBC resulted and hgb 6.7.  Will transfuse 2 units of pRBCs today and recheck in AM.  [name] 2:04 PM 05/25/2017

## 2017-05-25 NOTE — Progress Notes (Signed)
Subjective:  Patient remains confused up in chair.more awake today.  CT of abdomen showed spontaneous retroperitoneal hematoma .  Stent graft, stable    Objective:  Vital Signs in the last 24 hours: Temp:  [96.2 F (35.7 C)-96.6 F (35.9 C)] 96.3 F (35.7 C) (05/15 1251) Pulse Rate:  [47-102] 71 (05/15 1200) Resp:  [8-27] 18 (05/15 1200) SpO2:  [88 %-99 %] 97 % (05/15 1200) Arterial Line BP: (113-187)/(37-62) 157/46 (05/15 1200)  Intake/Output from previous day: 05/14 0701 - 05/15 0700 In: 4194.9 [I.V.:3664.9; IV Piggyback:530] Out: 3010 [Urine:3000; Stool:10] Intake/Output from this shift: Total I/O In: 742.9 [I.V.:742.9] Out: 325 [Urine:325]  Physical Exam: Neck: no adenopathy, no carotid bruit, no JVD and supple, symmetrical, trachea midline Lungs: decreased breath sounds at bases Heart: regular rate and rhythm, S1, S2 normal and soft systolic murmur noted Abdomen: moderately distended, absent bowel sounds. Extremities: no clubbing, cyanosis, 2+ edema noted  Lab Results: Recent Labs    05/23/17 0356  WBC 11.0*  HGB 8.6*  PLT 174   Recent Labs    05/24/17 0339 05/25/17 0400  NA 134* 138  K 5.1 4.2  CL 109 110  CO2 20* 21*  GLUCOSE 264* 154*  BUN 23* 24*  CREATININE 1.03 0.94   No results for input(s): TROPONINI in the last 72 hours.  Invalid input(s): CK, MB Hepatic Function Panel Recent Labs    05/23/17 0356  PROT 4.2*  ALBUMIN <1.0*  AST 34  ALT 20  ALKPHOS 56  BILITOT 0.6   No results for input(s): CHOL in the last 72 hours. No results for input(s): PROTIME in the last 72 hours.  Imaging: Imaging results have been reviewed and Ct Abdomen Pelvis W Contrast  Addendum Date: 05/25/2017   ADDENDUM REPORT: 05/25/2017 11:47 ADDENDUM: The original report was by Dr. Van Clines. The following addendum is by Dr. Van Clines: Critical Value/emergent results were called by telephone at the time of interpretation on 05/25/2017 at 11:44 am to  Dr. Gretta Arab, who verbally acknowledged these results. Electronically Signed   By: Van Clines M.D.   On: 05/25/2017 11:47   Result Date: 05/25/2017 CLINICAL DATA:  Postoperative infection. Abdominal tube was retracted forcefully. EXAM: CT ABDOMEN AND PELVIS WITH CONTRAST TECHNIQUE: Multidetector CT imaging of the abdomen and pelvis was performed using the standard protocol following bolus administration of intravenous contrast. CONTRAST:  157mL OMNIPAQUE IOHEXOL 300 MG/ML  SOLN COMPARISON:  05/19/2017 FINDINGS: Lower chest: Small right and trace left pleural effusions with passive atelectasis. There is some additional mild atelectasis in the right middle lobe. Mild cardiomegaly. Hepatobiliary: Motion artifact and streak artifact from arm positioning reduced sensitivity and specificity assessing the liver. At least 2 gallstones are present, the larger measuring 1.5 cm in diameter. No obvious hepatic abnormality observed observed. Pancreas: Unremarkable Spleen: Unremarkable Adrenals/Urinary Tract: Adrenal glands normal. Severely atrophic left kidney. Bilateral renal hypodense lesions are likely cysts, although 1 of these exophytic lesions on image 45/3 measuring 1.7 by 1.1 cm is mildly hyperdense and accordingly potentially a complex cyst or mass. This has roughly similar density characteristics to the precontrast exam of 05/19/2017 performed on the same CT scanner. A Foley catheter is present in the urinary bladder. Stomach/Bowel: Colorectal anastomosis is again observed. Currently there is only a trace amount of contained extraluminal gas tracking in the adjacent fascia plane, compared to the larger amount on 05/19/2017. However, there continues to be a considerable amount of free air in the belly, such that residual perforation  is not excluded. Descending and sigmoid colon diverticulosis. 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 The stent extends down into  the common iliac arteries, excluding a small abdominal aortic aneurysm. There is tenting of the celiac trunk and SMA which appear patent, and stenting of the patent right renal artery. 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 Reproductive: The prostate gland has a slightly lobular contour. Other: Acute right retroperitoneal hemorrhage primarily along the lateral and anterior margin of the right psoas muscle and between the right psoas and iliacus muscles, new compared to 05/19/17. This has multiple components with volume of he hematoma estimated at 280 cubic cm. I do not see definite active extravasation of contrast medium. Laparotomy wound healing by secondary intention noted. Right-sided ostomy site noted. There is a considerable new amount of ascites especially in the perihepatic region, containing locules of free intraperitoneal gas. There is also some new collected ascites loculated anteriorly in the left upper quadrant, and scattered pelvic ascites. Presacral edema similar to prior. Worsened subcutaneous edema especially along the flanks and upper thighs, and greater on the left than the right. Musculoskeletal: Thoracic and lumbar spondylosis with lumbar degenerative disc disease, and notable foraminal impingement bilaterally at L3-4, L4-5, and L5-S1. IMPRESSION: 1. Acute right retroperitoneal hemorrhage along the margin of the psoas and iliacus muscles is new compared to the 05/19/2017 exam, volume of hematoma approximately 280 cubic cm. No directly visualized active extravasation on today's exam. 2. Worsening ascites especially in the perihepatic region, with free intraperitoneal gas and loculated gas within the ascites. However, the amount of mesenteric gas  immediately adjacent to the ileo colic anastomosis has focally decreased compared to previous. 3. Small right and trace left pleural effusions with passive atelectasis. 4. Stable appearance of stent graft traversing thoracic and abdominal aortic aneurysms and extending into the iliac vessels. 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 5. Other imaging findings of potential clinical significance: Mild cardiomegaly. Cholelithiasis. Aortic Atherosclerosis (ICD10-I70.0). Aortic aneurysm NOS (ICD10-I71.9). Severely atrophic left kidney. Stable renal cystic lesions including a complex lesion of the right mid kidney which is most likely a complex cyst rather than a mass. Lobular prostate gland. Laparotomy wound healing by secondary intention. Right-sided ostomy. Worsened subcutaneous edema along the flanks and upper thighs. Multilevel lower lumbar foraminal impingement. Radiology assistant personnel have been notified to put me in telephone contact with the referring physician or the referring physician's clinical representative in order to discuss these findings. Once this communication is established I will issue an addendum to this report for documentation purposes. Electronically Signed: By: Van Clines M.D. On: 05/25/2017 11:20    Cardiac Studies:  Assessment/Plan:  Rectosigmoid adenocarcinoma status post resection with primary colorectal anastomosis and dilating loop ileostomy and umbilical hernia repair Spontaneous retroperitoneal hematoma Marked volume overload Abdominalsurgical wound infection Metabolic encephalopathy Status postSeptichypotensive shock secondary to intra-abdominal sepsis. Status postAcute on chronic renal injury secondary to hypotension/sepsis. 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 Lasix 40 mg IV daily Hold Lovenox. SCDs Check labs in am  LOS: 19 days    Charolette Forward 05/25/2017, 12:53 PM

## 2017-05-26 ENCOUNTER — Inpatient Hospital Stay (HOSPITAL_COMMUNITY): Payer: Medicare Other

## 2017-05-26 LAB — TYPE AND SCREEN
ABO/RH(D): B POS
ANTIBODY SCREEN: NEGATIVE
Unit division: 0
Unit division: 0

## 2017-05-26 LAB — GLUCOSE, CAPILLARY
GLUCOSE-CAPILLARY: 174 mg/dL — AB (ref 65–99)
GLUCOSE-CAPILLARY: 163 mg/dL — AB (ref 65–99)
GLUCOSE-CAPILLARY: 211 mg/dL — AB (ref 65–99)
Glucose-Capillary: 159 mg/dL — ABNORMAL HIGH (ref 65–99)
Glucose-Capillary: 204 mg/dL — ABNORMAL HIGH (ref 65–99)
Glucose-Capillary: 180 mg/dL — ABNORMAL HIGH (ref 65–99)

## 2017-05-26 LAB — BPAM RBC
Blood Product Expiration Date: 201906142359
Blood Product Expiration Date: 201906162359
ISSUE DATE / TIME: 201905151834
ISSUE DATE / TIME: 201905151512
Unit Type and Rh: 7300
Unit Type and Rh: 7300

## 2017-05-26 LAB — POCT I-STAT 3, ART BLOOD GAS (G3+)
Acid-base deficit: 7 mmol/L — ABNORMAL HIGH (ref 0.0–2.0)
Bicarbonate: 17.1 mmol/L — ABNORMAL LOW (ref 20.0–28.0)
O2 Saturation: 95 %
PCO2 ART: 27.2 mmHg — AB (ref 32.0–48.0)
PH ART: 7.405 (ref 7.350–7.450)
TCO2: 18 mmol/L — ABNORMAL LOW (ref 22–32)
pO2, Arterial: 73 mmHg — ABNORMAL LOW (ref 83.0–108.0)

## 2017-05-26 LAB — COMPREHENSIVE METABOLIC PANEL
ALT: 38 U/L (ref 17–63)
AST: 51 U/L — AB (ref 15–41)
Albumin: 1.2 g/dL — ABNORMAL LOW (ref 3.5–5.0)
Alkaline Phosphatase: 110 U/L (ref 38–126)
Anion gap: 10 (ref 5–15)
BILIRUBIN TOTAL: 0.8 mg/dL (ref 0.3–1.2)
BUN: 27 mg/dL — AB (ref 6–20)
CO2: 22 mmol/L (ref 22–32)
CREATININE: 1.04 mg/dL (ref 0.61–1.24)
Calcium: 7.2 mg/dL — ABNORMAL LOW (ref 8.9–10.3)
Chloride: 107 mmol/L (ref 101–111)
GFR calc Af Amer: >60 mL/min (ref >60)
Glucose, Bld: 185 mg/dL — ABNORMAL HIGH (ref 65–99)
POTASSIUM: 3.4 mmol/L — AB (ref 3.5–5.1)
Sodium: 139 mmol/L (ref 135–145)
TOTAL PROTEIN: 4.7 g/dL — AB (ref 6.5–8.1)

## 2017-05-26 LAB — CBC
HCT: 26.6 % — ABNORMAL LOW (ref 39.0–52.0)
Hemoglobin: 8.7 g/dL — ABNORMAL LOW (ref 13.0–17.0)
MCH: 25.1 pg — AB (ref 26.0–34.0)
MCHC: 32.7 g/dL (ref 30.0–36.0)
MCV: 76.9 fL — ABNORMAL LOW (ref 78.0–100.0)
PLATELETS: 268 10*3/uL (ref 150–400)
RBC: 3.46 MIL/uL — ABNORMAL LOW (ref 4.22–5.81)
RDW: 25.8 % — AB (ref 11.5–15.5)
WBC: 17 10*3/uL — ABNORMAL HIGH (ref 4.0–10.5)

## 2017-05-26 LAB — PHOSPHORUS: Phosphorus: 3.2 mg/dL (ref 2.5–4.6)

## 2017-05-26 LAB — MAGNESIUM: MAGNESIUM: 1.6 mg/dL — AB (ref 1.7–2.4)

## 2017-05-26 MED ORDER — MAGNESIUM SULFATE 2 GM/50ML IV SOLN
2.0000 g | Freq: Once | INTRAVENOUS | Status: AC
  Administered 2017-05-26: 2 g via INTRAVENOUS
  Filled 2017-05-26: qty 50

## 2017-05-26 MED ORDER — FENTANYL CITRATE (PF) 100 MCG/2ML IJ SOLN
INTRAMUSCULAR | Status: AC
  Filled 2017-05-26: qty 2

## 2017-05-26 MED ORDER — LIDOCAINE HCL 1 % IJ SOLN
INTRAMUSCULAR | Status: AC
  Filled 2017-05-26: qty 20

## 2017-05-26 MED ORDER — TRAVASOL 10 % IV SOLN
INTRAVENOUS | Status: AC
  Administered 2017-05-26: 17:00:00 via INTRAVENOUS
  Filled 2017-05-26: qty 1188

## 2017-05-26 MED ORDER — HALOPERIDOL LACTATE 5 MG/ML IJ SOLN
2.0000 mg | INTRAMUSCULAR | Status: DC | PRN
Start: 2017-05-26 — End: 2017-05-31
  Administered 2017-05-26: 2 mg via INTRAVENOUS
  Filled 2017-05-26: qty 1

## 2017-05-26 MED ORDER — POTASSIUM CHLORIDE 10 MEQ/50ML IV SOLN
10.0000 meq | INTRAVENOUS | Status: AC
  Administered 2017-05-26 (×4): 10 meq via INTRAVENOUS
  Filled 2017-05-26 (×4): qty 50

## 2017-05-26 MED ORDER — MIDAZOLAM HCL 2 MG/2ML IJ SOLN
INTRAMUSCULAR | Status: AC
  Filled 2017-05-26: qty 2

## 2017-05-26 MED ORDER — FENTANYL CITRATE (PF) 100 MCG/2ML IJ SOLN
INTRAMUSCULAR | Status: AC | PRN
  Administered 2017-05-26: 50 ug via INTRAVENOUS

## 2017-05-26 MED ORDER — SODIUM CHLORIDE 0.9% FLUSH
5.0000 mL | Freq: Three times a day (TID) | INTRAVENOUS | Status: DC
  Administered 2017-05-26: 10 mL
  Administered 2017-05-26: 5 mL
  Administered 2017-05-27: 10 mL
  Administered 2017-05-27 – 2017-06-20 (×58): 5 mL

## 2017-05-26 MED ORDER — MIDAZOLAM HCL 2 MG/2ML IJ SOLN
INTRAMUSCULAR | Status: AC | PRN
  Administered 2017-05-26: 1 mg via INTRAVENOUS

## 2017-05-26 NOTE — Progress Notes (Signed)
PULMONARY / CRITICAL CARE MEDICINE   Name: Jesse Le MRN: [id number] DOB: [birth year]    ADMISSION DATE:  05/06/2017 CONSULTATION DATE:  05/18/2017  REFERRING MD:  Dr. Terrence Dupont  CHIEF COMPLAINT:  Hypotension  HISTORY OF PRESENT ILLNESS:   72 yo male presented with abdominal pain, weight loss and intermittent hematochezia.  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  Had low anterior resection with primary anastomosis with diverting loop ileostomy.  Developed hypotension, hypoglycemia 5/07.  Developed hypotension and altered mental status 5/08 and transferred to ICU. Pt has an anastomotic leak, drain placed by surgery PMHx of CAD, DM, TAA.  5/13 The patient is awake and tracking but not really verbal. BP is running a bit high. 170-180. Patient remains ion a small dose of precedex at 0.20mcg/kg. respiratotory status appears comfortable. Does not appear to be onpressors at this time.  5/14 Little overall change in status The patient rermains lethargic and non-communicative. BP has been high requiring nicardipine drip in addition to prn labetalol and hydralazine Discussed the situation with surgery. The patient had pulled out his RLQ drain. A new CT scan ihas been ordered and he may need drain replaced.  5/15 The patient is awake and up in the chair. He went down for a CT scan today. There was less extraluminal gas in the area of the anastomosis. There is increasing ascities. A new spontaneous right retroperitonela hematoma was noted as well. The patient remains hemodynamically stable. He is more awake and speaking to his girfriend.  5/16 Patient is off the floor. From what I understand he went for a paracentesis and possible reemployment of drain. Has been afebbrile. His HB went up to 8. After receiving 2 units of PRBCs last night.    VITAL SIGNS: BP (!) 127/56   Pulse (!) 57   Temp (!) 97.5 F (36.4 C) (Axillary)   Resp 18   Ht 6' 0.99" (1.854  m)   Wt 273 lb 5.9 oz (124 kg)   SpO2 97%   BMI 36.07 kg/m   INTAKE / OUTPUT: I/O last 3 completed shifts: In: 6886 [I.V.:5376; Blood:630; IV Piggyback:880] Out: 9509 [Urine:5100; Stool:40]  PHYSICAL EXAMINATION:  General -obese man, no distress Eyes -equal, reactive ENT -port intention, oropharynx clear Cardiac -regular, no murmur, mild pretibial edema Chest -few scattered bilateral inspiratory crackles Abd -ileostomy   JP drain wa s removed by the patient two days agoAbdominal binder.Abdomen appears more distended Ext - 2+ lower extremity edema Skin -no rash Neuro -awake, somewhat sleepy, Not really seaking at this time current agitation   LABS:  BMET Recent Labs  Lab 05/24/17 0339 05/25/17 0400 05/26/17 0426  NA 134* 138 139  K 5.1 4.2 3.4*  CL 109 110 107  CO2 20* 21* 22  BUN 23* 24* 27*  CREATININE 1.03 0.94 1.04  GLUCOSE 264* 154* 185*    Electrolytes Recent Labs  Lab 05/23/17 0356 05/24/17 0339 05/25/17 0400 05/26/17 0426  CALCIUM 7.3* 7.4* 7.5* 7.2*  MG 2.2 2.1  --  1.6*  PHOS 4.6 3.3  --  3.2    CBC Recent Labs  Lab 05/23/17 0356 05/25/17 1222 05/26/17 0426  WBC 11.0* 14.4* 17.0*  HGB 8.6* 6.7* 8.7*  HCT 26.9* 22.4* 26.6*  PLT 174 232 268    Coag's Recent Labs  Lab 05/25/17 1638  INR 1.28    Sepsis Markers Recent Labs  Lab 05/22/17 0300  LATICACIDVEN 1.7    ABG Recent Labs  Lab 05/20/17 0432 05/22/17 0254  PHART 7.440 7.405  PCO2ART 27.5* 27.2*  PO2ART 76.1* 73.0*    Liver Enzymes Recent Labs  Lab 05/20/17 0348 05/23/17 0356 05/26/17 0426  AST 85* 34 51*  ALT 50 20 38  ALKPHOS 76 56 110  BILITOT 1.1 0.6 0.8  ALBUMIN 1.2* <1.0* 1.2*    Cardiac Enzymes No results for input(s): TROPONINI, PROBNP in the last 168 hours.  Glucose Recent Labs  Lab 05/25/17 2137 05/25/17 2315 05/26/17 0429 05/26/17 0837 05/26/17 1208 05/26/17 1527  GLUCAP 146* 174* 159* 163* 204* 211*    Imaging Ct Image Guided  Drainage By Percutaneous Catheter  Result Date: 05/26/2017 INDICATION: History of colon cancer, post LAR with primary colorectal anastomosis and diverting loop ileostomy with umbilical hernia repair on 05/11/2017 by Dr. Georgette Dover found to have postoperative pneumoperitoneum and perihepatic fluid on abdominal CT performed 05/25/2017 for which request made for placement of a percutaneous drainage catheter for infection source control purposes. EXAM: CT IMAGE GUIDED DRAINAGE BY PERCUTANEOUS CATHETER COMPARISON:  CT abdomen and pelvis - 05/25/2017; 05/19/2013 MEDICATIONS: The patient is currently admitted to the hospital and receiving intravenous antibiotics. The antibiotics were administered within an appropriate time frame prior to the initiation of the procedure. ANESTHESIA/SEDATION: Moderate (conscious) sedation was employed during this procedure. A total of Versed 1 mg and Fentanyl 50 mcg was administered intravenously. Moderate Sedation Time: 14 minutes. The patient's level of consciousness and vital signs were monitored continuously by radiology nursing throughout the procedure under my direct supervision. CONTRAST:  None COMPLICATIONS: None immediate. PROCEDURE: Informed written consent was obtained from patient's family after a discussion of the risks, benefits and alternatives to treatment. The patient was placed supine on the CT gantry and a pre procedural CT was performed re-demonstrating the known abscess/fluid collection within the perihepatic space. The procedure was planned. A timeout was performed prior to the initiation of the procedure. The skin overlying the ventral aspect the right upper abdomen was prepped and draped in the usual sterile fashion. The overlying soft tissues were anesthetized with 1% lidocaine with epinephrine. Appropriate trajectory was planned with the use of a 22 gauge spinal needle. An 18 gauge trocar needle was advanced into the abscess/fluid collection and a short Amplatz super  stiff wire was coiled within the collection. Appropriate positioning was confirmed with a limited CT scan. The tract was serially dilated allowing placement of a 10 [place] all-purpose drainage catheter. Appropriate positioning was confirmed with a limited postprocedural CT scan. Approximately 10 ml of dark yellow fluid was aspirated. The tube was connected to a JP bulb and sutured in place. A dressing was placed. The patient tolerated the procedure well without immediate post procedural complication. IMPRESSION: Successful CT guided placement of a 10 French all purpose drain catheter into the perihepatic air in fluid collection with aspiration of 10 mL of dark yellow fluid. Samples were sent to the laboratory as requested by the ordering clinical team. Electronically Signed   By: Sandi Mariscal M.D.   On: 05/26/2017 13:21     STUDIES:  CT abd 4/26 >> 9 cm mass CT abd 5/09 >> discontinuity in the posterior wall just above the anastomosis with feculent appearing material and gas in the pelvis, measuring up to 5x 3 cm anterior and superior to the sigmoid colon.  Fluid collection right lower quadrant measuring up to 6.4 x 3.5 cm. Moderate pneumoperitoneum.  CULTURES: Blood culture 5/8 >  ANTIBIOTICS: Zosyn 5/7 > 5/8 Zosyn 5/10>> Unasyn 5/8 >  5/10 Vancomycin 5/7 > Eraxis 5/8 >  SIGNIFICANT EVENTS: 4/26 admit for abdominal pain. Mass identified on CT 4/28 colonoscopy. Biopsy sent.  5/1 bx grows adeno. To OR for resection ileostomy 5/8 hypotension hypoglycemia AMS, transfer to ICU  LINES/TUBES: Lt IJ CVL  5/8 > JP Drain 5/1>  DISCUSSION: 72 yo male with sepsis from peritonitis after surgery for colon cancer and CT abd +  for anastomotic leak.>> Drain placed 5/1  ASSESSMENT / PLAN:  Sepsis with septic shock from peritonitis.  Restarted on norepinephrine, overall volume resuscitation is been 6-1/2 L over the last 3 days Colon cancer s/p low anterior resection with ileostomy. Continue current  Zosyn, vancomycin, Eraxis JP drain in place appears to be draining purulent fluid IV fluid resuscitation, attempt to wean norepinephrine as able Restart stress dose steroids (had been weaning). It appears that the patient is doing better hemodynamically at this time.   Discussed the plan with surgery re new CT scan and possible new drain placement. The patient is hemodynamically stable. He has been off pressors for 2 days. Surgery spokme tpo me and they plan on asking IR to do a poaracetesis foir diagnostic and therapeutic purposes. 5/16 The patient remains on the Vanco , Zosyn and Eraxis at this time. He is having large volume paracentesis presently  Systolic HTN BP has been hard to control. The patient is on a cardene drip at 10mg /hr. In addition there are prn orders for hydralazine and labetalol. Bp is better presently. BP running 140-160.    Acute renal failure from ATN.   Oliguria improved slightly over last 24 hours  Renal fn. Better Patient had better than 2200 liters out yesterday. Creatiine has trended down to 1.03 Renal fn. Is stable     DM type II. Hypoglycemia in setting of sepsis, continues to be a problem. Scheduled CBG Restart stress dose steroids, question some degree of adrenal insufficiency contributing to his hypoglycemia Replacing dextrose aggressively in his TPN, discussed with pharmacy on 5/12. Blood sugars are vacillatinmg quite a bit lately. May be up lately due to stress dose steroids.  Acute metabolic encephalopathy 2nd to sepsis. Improving Follow mental status off of Precedex Minimize sedating medications The patient's mental status has really made a dramatic turn around for thebetter in thelast 2 days. Relative adrenal insufficiency. Hydrocortisone back to 50 mg every 6 hours. I have decreased hydrocort to q 12h.  CXR persisistent small opacity at the left base. No mjaor cough 02 sats on nasal cannula in the high 90s. DVT prophylaxis - lovenox SUP  - pepcid Nutrition - NPO + TPN Goals of care - full code   Micheal Likens MD [facility]

## 2017-05-26 NOTE — Progress Notes (Signed)
PHARMACY - ADULT TOTAL PARENTERAL NUTRITION CONSULT NOTE   Pharmacy Consult:  TPN Indication: Prolonged ileus  Patient Measurements: Height: 6' 0.99" (185.4 cm) Weight: 252 lb 10.4 oz (114.6 kg) IBW/kg (Calculated) : 79.88 TPN AdjBW (KG): 94.5 Body mass index is 33.34 kg/m.  Assessment:  89 YOM with PMH significant for IDA with recent rectal bleed, PVD, CAD, HTN, HLD, COPD, and CKD, found to have rectosigmoid colon adenocarcinoma mass s/p lower anterior resection with primary colorectal anastomosis and diverting loop ileostomy with umbilical hernia repair on 05/11/17.  Developed anastomotic leak with feculent output in JP drain. Patient currently with high ileostomy output. Patient's abdomen has become more distended and patient was made NPO. Pharmacy consulted for TPN.   Patient was on clear liquids (on and off NPO as well) but only tolerating minimal intake (<25%) for >10 days. Patient is at risk for refeeding.   GI: Ileostomy O/P 92mL.  Pre-albumin <5.  Pepcid in TPN.  CT shows spontaneous RTP hematoma. Possible return to OR for washout of abdomen and drain replacement.  Endo: no hx DM - hypoglycemic prior to TPN.  CBGs/24h: 120-174.  HC for possible AI - weaned to 50mg  IV BID Insulin requirements in the past 24 hours: 18 units SSI Lytes: K 3.4 (on sch Lasix), Mag 1.6, all other normal Renal: SCr 1.04 (stable), BUN 27 - UOP 1.4 ml/kg/hr Pulm: 2L [place] Cards: BP normal, HR low-low normal - nicardipine gtt, IV metoprolol, Lasix 40mg  IV daily, PRN hydralazine Hepatobil: LFTs/AlkPhos/Tbili/TG wnl. Perihepatic fluid - IR to drain Neuro: Precedex gtt, Oxycodone, PRN Dilaudid - RASS 1  ID: Vanc/Zosyn/Eraxis for peritonitis. Wound re-opened and packed on 5/12.  Afebrile, WBC up 17  TPN Access: CVC triple lumen placed 05/18/17 TPN start date: 05/20/17  Nutritional Goals (per RD rec on 5/9): 2100-2400 kCal, 105-120g protein, 2.1- 2.4 L per day  Goal TPN rate is 90 ml/hr (provides 100% of  needs) -In order to help glucose, non-lipid days: 55.5g/L AA + 23.4% dextrose versus lipid days: 55.5g/L AA, 16.4% dextrose, and 23.8g/L fat (only doing lipids 2 x week to keep <100g/week while in ICU for now) for total kcal ~2200 per daily  Current Nutrition:  TPN   Plan:  Continue TPN at goal rate of 90 ml/hr.  Giving ILE twice weekly during the first 7 days of TPN due to high risk for EFAD - none today, start daily lipid on 05/27/17 TPN provides 119g AA and 497g CHO, which provides 2164 kCal per day, meeting 100% of needs Electrolytes in TPN: increase Na, increase K (75 meq/d), change to Cl:Ac 1:1 Daily multivitamin and trace elements in TPN Continue resistant SSI Q4H + 15 units regular insulin in TPN Pepcid 40mg  daily in TPN F/U AM labs, Onc plans, CBGs  KCl 10 meq IV q1h x4 Mag 2 g IV x1   Renold Genta, PharmD, BCPS Clinical Pharmacist Clinical phone for 05/26/2017 until 3p is [phone] After 3p, please call Main Rx at [phone] for assistance 05/26/2017 6:54 AM

## 2017-05-26 NOTE — Progress Notes (Signed)
[facility] called d/t pt becoming increasingly agitated and combative. Pt was yelling and was not redirectable despite RN trying to reorient patient. Haldol ordered and given.  Pt still restless but is calming down.  Will continue to monitor.

## 2017-05-26 NOTE — Progress Notes (Signed)
Progress Note: General Surgery Service   Assessment/Plan: Patient Active Problem List   Diagnosis Date Noted  . Septic shock ([place])   . Hypoglycemia after GI (gastrointestinal) surgery   . Colostomy present ([place])   . Ventral hernia s/p primary repair 05/11/2017 05/14/2017  . Physical deconditioning 05/14/2017  . CAD (coronary atherosclerotic disease) 05/14/2017  . Diabetes mellitus without complication ([place])   . Acute blood loss anemia   . 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 Iron deficiency anemia due to chronic blood loss   . Abnormal CT scan, sigmoid colon   . Generalized abdominal pain 05/06/2017  . 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 Aftercare following surgery of the circulatory system, [place] 11/22/2011   s/p Procedure(s): Colon Resection Sigmoid Low Anterior Resection with Staple Colon Relast HERNIA REPAIR UMBILICAL ADULT Creation Vertical Loop ILEOSTOMY 05/11/2017 Leak at anastomosis, diverted with ileostomy, drain inadvertently removed 2 days ago, CT shows moderate pneumoperitoneum and complex RUQ collection concerning for abscess -continue TPN -continue antibiotics -IR for drainage of RUQ abscess today    LOS: 20 days  Chief Complaint/Subjective: Received blood for anemia yesterday  Objective: Vital signs in last 24 hours: Temp:  [96.3 F (35.7 C)-97.9 F (36.6 C)] 97.9 F (36.6 C) (05/15 2115) Pulse Rate:  [49-102] 69 (05/16 0701) Resp:  [13-25] 15 (05/16 0701) BP: (145)/(61) 145/61 (05/15 1820) SpO2:  [88 %-100 %] 99 % (05/16 0701) Arterial Line BP: (117-189)/(37-67) 168/54 (05/16 0701) Last BM Date: 05/25/17  Intake/Output from previous day: 05/15 0701 - 05/16 0700 In: 4582.9 [I.V.:3552.9; Blood:630; IV Piggyback:400] Out: 0865 [Urine:3925; Stool:30] Intake/Output this shift: No intake/output data recorded.  Lungs: wheezing bilaterally  Cardiovascular: RRR  Abd: soft, ATTP, mod distension, midline wound with  fibrinous base, no evisceration, ostomy pink with minimal drainage  Extremities: +edema  Neuro: responds to questions with incomprehensible mumbles, moves all extremities  Lab Results: CBC  Recent Labs    05/25/17 1222 05/26/17 0426  WBC 14.4* 17.0*  HGB 6.7* 8.7*  HCT 22.4* 26.6*  PLT 232 268   BMET Recent Labs    05/25/17 0400 05/26/17 0426  NA 138 139  K 4.2 3.4*  CL 110 107  CO2 21* 22  GLUCOSE 154* 185*  BUN 24* 27*  CREATININE 0.94 1.04  CALCIUM 7.5* 7.2*   PT/INR Recent Labs    05/25/17 1638  LABPROT 15.9*  INR 1.28   ABG No results for input(s): PHART, HCO3 in the last 72 hours.  Invalid input(s): PCO2, PO2  Studies/Results:  Anti-infectives: Anti-infectives (From admission, onward)   Start     Dose/Rate Route Frequency Ordered Stop   05/20/17 2230  vancomycin (VANCOCIN) 1,250 mg in sodium chloride 0.9 % 250 mL IVPB     1,250 mg 166.7 mL/hr over 90 Minutes Intravenous Every 24 hours 05/20/17 2151     05/20/17 1400  piperacillin-tazobactam (ZOSYN) IVPB 3.375 g     3.375 g 12.5 mL/hr over 240 Minutes Intravenous Every 8 hours 05/20/17 1044     05/20/17 0500  anidulafungin (ERAXIS) 100 mg in sodium chloride 0.9 % 100 mL IVPB     100 mg 78 mL/hr over 100 Minutes Intravenous Every 24 hours 05/19/17 0116     05/19/17 2200  vancomycin (VANCOCIN) 1,500 mg in sodium chloride 0.9 % 500 mL IVPB  Status:  Discontinued     1,500 mg 250 mL/hr over 120 Minutes Intravenous Every 24 hours 05/18/17 2126 05/20/17 2150   05/19/17 0130  anidulafungin (ERAXIS) 200 mg in sodium chloride 0.9 % 200 mL IVPB     200 mg 78 mL/hr over 200 Minutes Intravenous  Once 05/19/17 0116 05/19/17 0520   05/19/17 0130  Ampicillin-Sulbactam (UNASYN) 3 g in sodium chloride 0.9 % 100 mL IVPB  Status:  Discontinued     3 g 200 mL/hr over 30 Minutes Intravenous Every 8 hours 05/19/17 0118 05/20/17 1044   05/18/17 2200  vancomycin (VANCOCIN) 1,750 mg in sodium chloride 0.9 % 500 mL IVPB      1,750 mg 250 mL/hr over 120 Minutes Intravenous  Once 05/18/17 2126 05/19/17 0400   05/17/17 0930  piperacillin-tazobactam (ZOSYN) IVPB 3.375 g  Status:  Discontinued     3.375 g 12.5 mL/hr over 240 Minutes Intravenous Every 8 hours 05/17/17 0836 05/19/17 0118   05/11/17 2200  cefoTEtan (CEFOTAN) 2 g in sodium chloride 0.9 % 100 mL IVPB     2 g 200 mL/hr over 30 Minutes Intravenous Every 12 hours 05/11/17 1839 05/11/17 2034   05/11/17 0600  cefoTEtan in Dextrose 5% (CEFOTAN) IVPB 2 g     2 g Intravenous On call to O.R. 05/10/17 1142 05/11/17 1416      Medications: Scheduled Meds: . chlorhexidine  15 mL Mouth Rinse BID  . Chlorhexidine Gluconate Cloth  6 each Topical Daily  . furosemide  40 mg Intravenous Daily  . hydrocortisone sod succinate (SOLU-CORTEF) inj  50 mg Intravenous Q12H  . insulin aspart  0-20 Units Subcutaneous Q4H  . lip balm  1 application Topical BID  . mouth rinse  15 mL Mouth Rinse q12n4p  . metoprolol tartrate  2.5 mg Intravenous Q6H   Continuous Infusions: . anidulafungin Stopped (05/26/17 0612)  . dexmedetomidine (PRECEDEX) IV infusion 0.5 mcg/kg/hr (05/26/17 0424)  . magnesium sulfate 1 - 4 g bolus IVPB    . niCARDipine 9 mg/hr (05/26/17 5009)  . norepinephrine (LEVOPHED) Adult infusion Stopped (05/22/17 1922)  . piperacillin-tazobactam (ZOSYN)  IV 3.375 g (05/26/17 0515)  . potassium chloride 10 mEq (05/26/17 0750)  . TPN ADULT (ION) 90 mL/hr at 05/25/17 1837  . vancomycin Stopped (05/25/17 2239)   PRN Meds:.dextrose, [DISCONTINUED] diphenhydrAMINE **OR** diphenhydrAMINE, haloperidol lactate, hydrALAZINE, hydrocortisone, hydrocortisone cream, HYDROmorphone (DILAUDID) injection, labetalol, [DISCONTINUED] ondansetron **OR** ondansetron (ZOFRAN) IV, prochlorperazine, sodium chloride flush  Mickeal Skinner, MD Pg# [phone] [facility]

## 2017-05-26 NOTE — Progress Notes (Signed)
PULMONARY / CRITICAL CARE MEDICINE   Name: Jesse Le MRN: [id number] DOB: July 30, 1945    ADMISSION DATE:  05/06/2017 CONSULTATION DATE:  05/18/2017  REFERRING MD:  Dr. Terrence Dupont  CHIEF COMPLAINT:  Hypotension  HISTORY OF PRESENT ILLNESS:   72 yo male presented with abdominal pain, weight loss and intermittent hematochezia.  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  Had low anterior resection with primary anastomosis with diverting loop ileostomy.  Developed hypotension, hypoglycemia 5/07.  Developed hypotension and altered mental status 5/08 and transferred to ICU. Pt has an anastomotic leak, drain placed by surgery PMHx of CAD, DM, TAA.  5/13 The patient is awake and tracking but not really verbal. BP is running a bit high. 170-180. Patient remains ion a small dose of precedex at 0.37mcg/kg. respiratotory status appears comfortable. Does not appear to be onpressors at this time.  5/14 Little overall change in status The patient rermains lethargic and non-communicative. BP has been high requiring nicardipine drip in addition to prn labetalol and hydralazine Discussed the situation with surgery. The patient had pulled out his RLQ drain. A new CT scan ihas been ordered and he may need drain replaced.  5/15 The patient is awake and up in the chair. He went down for a CT scan today. There was less extraluminal gas in the area of the anastomosis. There is increasing ascities. A new spontaneous right retroperitonela hematoma was noted as well. The patient remains hemodynamically stable. He is more awake and speaking to his girfriend.  5/16 Patient is off the floor. From what I understand he went for a paracentesis and possible reemployment of drain. Has been afebbrile. His HB went up to 8. After receiving 2 units of PRBCs last night.    VITAL SIGNS: BP (!) 139/51   Pulse (!) 55   Temp 97.6 F (36.4 C) (Axillary)   Resp 17   Ht 6' 0.99" (1.854 m)    Wt 252 lb 10.4 oz (114.6 kg)   SpO2 96%   BMI 33.34 kg/m   INTAKE / OUTPUT: I/O last 3 completed shifts: In: 6886 [I.V.:5376; Blood:630; IV Piggyback:880] Out: 4098 [Urine:5100; Stool:40]  PHYSICAL EXAMINATION:  General -obese man, no distress Eyes -equal, reactive ENT -port intention, oropharynx clear Cardiac -regular, no murmur, mild pretibial edema Chest -few scattered bilateral inspiratory crackles Abd -ileostomy   JP drain wa s removed by the patient two days agoAbdominal binder.Abdomen appears more distended Ext - 2+ lower extremity edema Skin -no rash Neuro -awake, somewhat sleepy, Not really seaking at this time current agitation   LABS:  BMET Recent Labs  Lab 05/24/17 0339 05/25/17 0400 05/26/17 0426  NA 134* 138 139  K 5.1 4.2 3.4*  CL 109 110 107  CO2 20* 21* 22  BUN 23* 24* 27*  CREATININE 1.03 0.94 1.04  GLUCOSE 264* 154* 185*    Electrolytes Recent Labs  Lab 05/23/17 0356 05/24/17 0339 05/25/17 0400 05/26/17 0426  CALCIUM 7.3* 7.4* 7.5* 7.2*  MG 2.2 2.1  --  1.6*  PHOS 4.6 3.3  --  3.2    CBC Recent Labs  Lab 05/23/17 0356 05/25/17 1222 05/26/17 0426  WBC 11.0* 14.4* 17.0*  HGB 8.6* 6.7* 8.7*  HCT 26.9* 22.4* 26.6*  PLT 174 232 268    Coag's Recent Labs  Lab 05/25/17 1638  INR 1.28    Sepsis Markers Recent Labs  Lab 05/22/17 0300  LATICACIDVEN 1.7    ABG Recent Labs  Lab  05/19/17 1615 05/20/17 0432 05/22/17 0254  PHART 7.371 7.440 7.405  PCO2ART 29.4* 27.5* 27.2*  PO2ART 79.1* 76.1* 73.0*    Liver Enzymes Recent Labs  Lab 05/20/17 0348 05/23/17 0356 05/26/17 0426  AST 85* 34 51*  ALT 50 20 38  ALKPHOS 76 56 110  BILITOT 1.1 0.6 0.8  ALBUMIN 1.2* <1.0* 1.2*    Cardiac Enzymes No results for input(s): TROPONINI, PROBNP in the last 168 hours.  Glucose Recent Labs  Lab 05/25/17 1244 05/25/17 1617 05/25/17 2137 05/25/17 2315 05/26/17 0429 05/26/17 0837  GLUCAP 140* 123* 146* 174* 159* 163*     Imaging No results found.   STUDIES:  CT abd 4/26 >> 9 cm mass CT abd 5/09 >> discontinuity in the posterior wall just above the anastomosis with feculent appearing material and gas in the pelvis, measuring up to 5x 3 cm anterior and superior to the sigmoid colon.  Fluid collection right lower quadrant measuring up to 6.4 x 3.5 cm. Moderate pneumoperitoneum.  CULTURES: Blood culture 5/8 >  ANTIBIOTICS: Zosyn 5/7 > 5/8 Zosyn 5/10>> Unasyn 5/8 > 5/10 Vancomycin 5/7 > Eraxis 5/8 >  SIGNIFICANT EVENTS: 4/26 admit for abdominal pain. Mass identified on CT 4/28 colonoscopy. Biopsy sent.  5/1 bx grows adeno. To OR for resection ileostomy 5/8 hypotension hypoglycemia AMS, transfer to ICU  LINES/TUBES: Lt IJ CVL  5/8 > JP Drain 5/1>  DISCUSSION: 73 yo male with sepsis from peritonitis after surgery for colon cancer and CT abd +  for anastomotic leak.>> Drain placed 5/1  ASSESSMENT / PLAN:  Sepsis with septic shock from peritonitis.  Restarted on norepinephrine, overall volume resuscitation is been 6-1/2 L over the last 3 days Colon cancer s/p low anterior resection with ileostomy. Continue current Zosyn, vancomycin, Eraxis JP drain in place appears to be draining purulent fluid IV fluid resuscitation, attempt to wean norepinephrine as able Restart stress dose steroids (had been weaning). It appears that the patient is doing better hemodynamically at this time.   Discussed the plan with surgery re new CT scan and possible new drain placement. The patient is hemodynamically stable. He has been off pressors for 2 days. Surgery spokme tpo me and they plan on asking IR to do a poaracetesis foir diagnostic and therapeutic purposes. 5/16 The patient remains on the Vanco , Zosyn and Eraxis at this time. He is having large volume paracentesis presently  Systolic HTN BP has been hard to control. The patient is on a cardene drip at 10mg /hr. In addition there are prn orders for  hydralazine and labetalol. Bp is better presently. BP running 140-160.    Acute renal failure from ATN.   Oliguria improved slightly over last 24 hours  Renal fn. Better Patient had better than 2200 liters out yesterday. Creatiine has trended down to 1.03 Renal fn. Is stable     DM type II. Hypoglycemia in setting of sepsis, continues to be a problem. Scheduled CBG Restart stress dose steroids, question some degree of adrenal insufficiency contributing to his hypoglycemia Replacing dextrose aggressively in his TPN, discussed with pharmacy on 5/12. Blood sugars are vacillatinmg quite a bit lately. May be up lately due to stress dose steroids.  Acute metabolic encephalopathy 2nd to sepsis. Improving Follow mental status off of Precedex Minimize sedating medications The patient's mental status has really made a dramatic turn around for thebetter in thelast 2 days. Relative adrenal insufficiency. Hydrocortisone back to 50 mg every 6 hours. I have decreased hydrocort to q 12h.  CXR persisistent small opacity at the left base. No mjaor cough 02 sats on nasal cannula in the high 90s. DVT prophylaxis - lovenox SUP - pepcid Nutrition - NPO + TPN Goals of care - full code   Micheal Likens MD [facility]

## 2017-05-26 NOTE — Consult Note (Addendum)
[place] Nurse ostomy follow up Surgical team following for assessment and plan of care for abd wound.  Stoma type/location:Ileostomy surgery performed on 5/1 Stoma red and viable, 1 inch, above skin level.There is mucocutaneous separation to outer stoma from 2:00 o'clock to 5:00 o'clock, with depth of .2cm, red and moist; applied barrier ring to assist with protecting site and topromotehealing. Output:mod amtliquid brown stool Ostomy pouching:2pc.  Education provided:Pt is in ICU and was very groggy. No family present for teaching session.Discontinued use of high output pouch since output has decreased at this time. Supplies at bedside for staff nurse use, educational materials left in the room. Ptwill not be able to change or empty pouch without assistance. Enrolled patient in [place] Discharge program:Yes [place] team will continue to follow while in the hospital for further teaching sessionswhen stable and out of ICU. Julien Girt MSN, RN, [place], [place], [place]

## 2017-05-26 NOTE — Progress Notes (Signed)
Nutrition Follow-up  INTERVENTION:   TPN management per Pharmacy  NUTRITION DIAGNOSIS:   Increased nutrient needs related to (cancer) as evidenced by estimated needs. Ongoing.   GOAL:   Patient will meet greater than or equal to 90% of their needs Met.   MONITOR:   Diet advancement, I & O's  ASSESSMENT:   72 year old male here with iron deficiency anemia with recent rectal bleeding found to have abnormal CT scan of the abdomen and pelvis suggestive of a sigmoid cancer.  He has a history of peripheral vascular disease, aortic aneurysm with endovascular repair, CAD, hypertension, hyperlipidemia, COPD, CKD.  5/1 ileostomy then anastomotic leak 5/10 TPN started 5/16 pt in IR for RUQ abscess drainage   Remains NPO with very little ostomy output  Noted pt given fluid bolus, pt now 10 L positive. Pt weight up 44 lb   Medications reviewed and include: lasix, SSI, 2 gram mag sulfate IV x 1,  KCl 10 meq IV q1h x4 Labs reviewed: magnesium 1.6 (L)  TPN @ 90 ml/hr with ILE twice weekly which provides 2164 kcal and 119 gram sprotein.   Diet Order:   Diet Order           Diet NPO time specified  Diet effective now          EDUCATION NEEDS:   Not appropriate for education at this time  Skin:  Skin Assessment: Skin Integrity Issues: Skin Integrity Issues:: Incisions Incisions: closed abdomen  Last BM:  30 ml via ileostomy   Height:   Ht Readings from Last 1 Encounters:  05/11/17 6' 0.99" (1.854 m)    Weight:   Wt Readings from Last 1 Encounters:  05/20/17 252 lb 10.4 oz (114.6 kg)    Ideal Body Weight:  83.6 kg  BMI:  Body mass index is 33.34 kg/m.  Estimated Nutritional Needs:   Kcal:  2100-2400  Protein:  105-120 grams  Fluid:  2.1 - 2.4 L/day  Maylon Peppers RD, LDN, CNSC [phone] Pager [phone] After Hours Pager

## 2017-05-26 NOTE — Procedures (Signed)
Pre procedural Dx: Peri-hepatic fluid collection Post procedural Dx: Same  Technically successful CT guided placed of a 10 Fr drainage catheter placement into the right peri-hepatic space yielding 10 cc of dark yellow fluid.    All aspirated samples sent to the laboratory for analysis.    EBL: None  Complications: None immediate  Ronny Bacon, MD Pager #: [phone]

## 2017-05-26 NOTE — Progress Notes (Signed)
Subjective:  Patient awake denies any complaints going to IR for drainage of right upper quadrant possible abscess. Hemoglobin appropriately increased after packed RBCs, white count has elevated to 17,000.  Objective:  Vital Signs in the last 24 hours: Temp:  [96.3 F (35.7 C)-97.9 F (36.6 C)] 97.6 F (36.4 C) (05/16 0840) Pulse Rate:  [49-102] 50 (05/16 0900) Resp:  [13-25] 14 (05/16 0900) BP: (145)/(61) 145/61 (05/15 1820) SpO2:  [88 %-100 %] 97 % (05/16 0900) Arterial Line BP: (117-189)/(37-67) 142/46 (05/16 0900)  Intake/Output from previous day: 05/15 0701 - 05/16 0700 In: 4582.9 [I.V.:3552.9; Blood:630; IV Piggyback:400] Out: 3790 [Urine:3925; Stool:30] Intake/Output from this shift: Total I/O In: 445.9 [I.V.:445.9] Out: 350 [Urine:350]  Physical Exam: Neck: no adenopathy, no carotid bruit, no JVD and supple, symmetrical, trachea midline Lungs: Decreased breath sound at bases clear anteriorly Heart: regular rate and rhythm, S1, S2 normal and Soft systolic murmur noted Abdomen: Distended bowel sounds absent surgical dressings noted Extremities: No clubbing cyanosis 1+ edema  Lab Results: Recent Labs    05/25/17 1222 05/26/17 0426  WBC 14.4* 17.0*  HGB 6.7* 8.7*  PLT 232 268   Recent Labs    05/25/17 0400 05/26/17 0426  NA 138 139  K 4.2 3.4*  CL 110 107  CO2 21* 22  GLUCOSE 154* 185*  BUN 24* 27*  CREATININE 0.94 1.04   No results for input(s): TROPONINI in the last 72 hours.  Invalid input(s): CK, MB Hepatic Function Panel Recent Labs    05/26/17 0426  PROT 4.7*  ALBUMIN 1.2*  AST 51*  ALT 38  ALKPHOS 110  BILITOT 0.8   No results for input(s): CHOL in the last 72 hours. No results for input(s): PROTIME in the last 72 hours.  Imaging: Imaging results have been reviewed and Ct Abdomen Pelvis W Contrast  Addendum Date: 05/25/2017   ADDENDUM REPORT: 05/25/2017 11:47 ADDENDUM: The original report was by Dr. Van Clines. The following  addendum is by Dr. Van Clines: Critical Value/emergent results were called by telephone at the time of interpretation on 05/25/2017 at 11:44 am to Dr. Gretta Arab, who verbally acknowledged these results. Electronically Signed   By: Van Clines M.D.   On: 05/25/2017 11:47   Result Date: 05/25/2017 CLINICAL DATA:  Postoperative infection. Abdominal tube was retracted forcefully. EXAM: CT ABDOMEN AND PELVIS WITH CONTRAST TECHNIQUE: Multidetector CT imaging of the abdomen and pelvis was performed using the standard protocol following bolus administration of intravenous contrast. CONTRAST:  144mL OMNIPAQUE IOHEXOL 300 MG/ML  SOLN COMPARISON:  05/19/2017 FINDINGS: Lower chest: Small right and trace left pleural effusions with passive atelectasis. There is some additional mild atelectasis in the right middle lobe. Mild cardiomegaly. Hepatobiliary: Motion artifact and streak artifact from arm positioning reduced sensitivity and specificity assessing the liver. At least 2 gallstones are present, the larger measuring 1.5 cm in diameter. No obvious hepatic abnormality observed observed. Pancreas: Unremarkable Spleen: Unremarkable Adrenals/Urinary Tract: Adrenal glands normal. Severely atrophic left kidney. Bilateral renal hypodense lesions are likely cysts, although 1 of these exophytic lesions on image 45/3 measuring 1.7 by 1.1 cm is mildly hyperdense and accordingly potentially a complex cyst or mass. This has roughly similar density characteristics to the precontrast exam of 05/19/2017 performed on the same CT scanner. A Foley catheter is present in the urinary bladder. Stomach/Bowel: Colorectal anastomosis is again observed. Currently there is only a trace amount of contained extraluminal gas tracking in the adjacent fascia plane, compared to the larger amount on  05/19/2017. However, there continues to be a considerable amount of free air in the belly, such that residual perforation is not excluded.  Descending and sigmoid colon diverticulosis. 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 The stent extends down into the common iliac arteries, excluding a small abdominal aortic aneurysm. There is tenting of the celiac trunk and SMA which appear patent, and stenting of the patent right renal artery. 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 Reproductive: The prostate gland has a slightly lobular contour. Other: Acute right retroperitoneal hemorrhage primarily along the lateral and anterior margin of the right psoas muscle and between the right psoas and iliacus muscles, new compared to 05/19/17. This has multiple components with volume of he hematoma estimated at 280 cubic cm. I do not see definite active extravasation of contrast medium. Laparotomy wound healing by secondary intention noted. Right-sided ostomy site noted. There is a considerable new amount of ascites especially in the perihepatic region, containing locules of free intraperitoneal gas. There is also some new collected ascites loculated anteriorly in the left upper quadrant, and scattered pelvic ascites. Presacral edema similar to prior. Worsened subcutaneous edema especially along the flanks and upper thighs, and greater on the left than the right. Musculoskeletal: Thoracic and lumbar spondylosis with lumbar degenerative disc disease, and notable foraminal impingement bilaterally at L3-4, L4-5, and L5-S1. IMPRESSION: 1. Acute right retroperitoneal hemorrhage along the margin of the psoas and iliacus muscles is new compared to the 05/19/2017 exam, volume of hematoma approximately 280 cubic cm. No directly visualized active extravasation on today's exam. 2.  Worsening ascites especially in the perihepatic region, with free intraperitoneal gas and loculated gas within the ascites. However, the amount of mesenteric gas immediately adjacent to the ileo colic anastomosis has focally decreased compared to previous. 3. Small right and trace left pleural effusions with passive atelectasis. 4. Stable appearance of stent graft traversing thoracic and abdominal aortic aneurysms and extending into the iliac vessels. 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 5. Other imaging findings of potential clinical significance: Mild cardiomegaly. Cholelithiasis. Aortic Atherosclerosis (ICD10-I70.0). Aortic aneurysm NOS (ICD10-I71.9). Severely atrophic left kidney. Stable renal cystic lesions including a complex lesion of the right mid kidney which is most likely a complex cyst rather than a mass. Lobular prostate gland. Laparotomy wound healing by secondary intention. Right-sided ostomy. Worsened subcutaneous edema along the flanks and upper thighs. Multilevel lower lumbar foraminal impingement. Radiology assistant personnel have been notified to put me in telephone contact with the referring physician or the referring physician's clinical representative in order to discuss these findings. Once this communication is established I will issue an addendum to this report for documentation purposes. Electronically Signed: By: Van Clines M.D. On: 05/25/2017 11:20    Cardiac Studies:  Assessment/Plan:  Rectosigmoid adenocarcinoma status post resection with primary colorectal anastomosis and dilating loop ileostomy and umbilical hernia repair Spontaneous retroperitoneal hematoma Marked volume overload Abdominalsurgical wound infection Metabolic encephalopathy Status postSeptichypotensive shock secondary to Intra-abdominal sepsis/possible abscess. Status postAcute on chronic renal injury secondary to hypotension/sepsis. 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

## 2017-05-27 LAB — BASIC METABOLIC PANEL
ANION GAP: 6 (ref 5–15)
BUN: 24 mg/dL — ABNORMAL HIGH (ref 6–20)
CALCIUM: 7.2 mg/dL — AB (ref 8.9–10.3)
CO2: 23 mmol/L (ref 22–32)
CREATININE: 0.95 mg/dL (ref 0.61–1.24)
Chloride: 111 mmol/L (ref 101–111)
Glucose, Bld: 182 mg/dL — ABNORMAL HIGH (ref 65–99)
Potassium: 3.5 mmol/L (ref 3.5–5.1)
SODIUM: 140 mmol/L (ref 135–145)

## 2017-05-27 LAB — GLUCOSE, CAPILLARY
GLUCOSE-CAPILLARY: 219 mg/dL — AB (ref 65–99)
GLUCOSE-CAPILLARY: 183 mg/dL — AB (ref 65–99)
Glucose-Capillary: 145 mg/dL — ABNORMAL HIGH (ref 65–99)
Glucose-Capillary: 154 mg/dL — ABNORMAL HIGH (ref 65–99)
Glucose-Capillary: 159 mg/dL — ABNORMAL HIGH (ref 65–99)
Glucose-Capillary: 225 mg/dL — ABNORMAL HIGH (ref 65–99)

## 2017-05-27 LAB — CBC
HCT: 28.3 % — ABNORMAL LOW (ref 39.0–52.0)
Hemoglobin: 9.1 g/dL — ABNORMAL LOW (ref 13.0–17.0)
MCH: 25.3 pg — ABNORMAL LOW (ref 26.0–34.0)
MCHC: 32.2 g/dL (ref 30.0–36.0)
MCV: 78.8 fL (ref 78.0–100.0)
PLATELETS: 320 10*3/uL (ref 150–400)
RBC: 3.59 MIL/uL — ABNORMAL LOW (ref 4.22–5.81)
RDW: 26.3 % — AB (ref 11.5–15.5)
WBC: 20 10*3/uL — AB (ref 4.0–10.5)

## 2017-05-27 LAB — MAGNESIUM: MAGNESIUM: 1.8 mg/dL (ref 1.7–2.4)

## 2017-05-27 MED ORDER — TRACE MINERALS CR-CU-MN-SE-ZN 10-1000-500-60 MCG/ML IV SOLN
INTRAVENOUS | Status: AC
  Administered 2017-05-27: 17:00:00 via INTRAVENOUS
  Filled 2017-05-27: qty 772.8

## 2017-05-27 MED ORDER — VANCOMYCIN HCL 10 G IV SOLR
1250.0000 mg | Freq: Two times a day (BID) | INTRAVENOUS | Status: DC
  Administered 2017-05-27: 1250 mg via INTRAVENOUS
  Filled 2017-05-27: qty 1250

## 2017-05-27 MED ORDER — VANCOMYCIN HCL 10 G IV SOLR
1250.0000 mg | INTRAVENOUS | Status: DC
  Administered 2017-05-28 – 2017-05-30 (×3): 1250 mg via INTRAVENOUS
  Filled 2017-05-27 (×3): qty 1250

## 2017-05-27 MED ORDER — ORAL CARE MOUTH RINSE
15.0000 mL | OROMUCOSAL | Status: DC
  Administered 2017-05-27 – 2017-05-29 (×13): 15 mL via OROMUCOSAL

## 2017-05-27 MED ORDER — POTASSIUM CHLORIDE 10 MEQ/50ML IV SOLN
10.0000 meq | INTRAVENOUS | Status: AC
  Administered 2017-05-27 (×6): 10 meq via INTRAVENOUS
  Filled 2017-05-27 (×6): qty 50

## 2017-05-27 MED ORDER — MAGNESIUM SULFATE 2 GM/50ML IV SOLN
2.0000 g | Freq: Once | INTRAVENOUS | Status: AC
  Administered 2017-05-27: 2 g via INTRAVENOUS
  Filled 2017-05-27: qty 50

## 2017-05-27 NOTE — Progress Notes (Signed)
Supervising Physician: Arne Cleveland  Patient Status:  [facility] - In-pt  Chief Complaint: Peri-hepatic fluid collection  Subjective: Confused, sitting in chair.   Allergies: Patient has no known allergies.  Medications: Prior to Admission medications   Medication Sig Start Date End Date Taking? Authorizing Provider  aspirin EC 81 MG tablet Take 81 mg by mouth daily.   Yes [provider]  metFORMIN (GLUCOPHAGE) 500 MG tablet Take 1 tablet (500 mg total) by mouth 2 (two) times daily with a meal. 10/13/12  Yes Charolette Forward, MD  metoprolol tartrate (LOPRESSOR) 50 MG tablet Take 50 mg by mouth 2 (two) times daily.   Yes [provider]  glimepiride (AMARYL) 2 MG tablet Take 1 tablet (2 mg total) by mouth daily with breakfast. Patient not taking: Reported on 05/06/2017 10/13/12   Charolette Forward, MD     Vital Signs: BP (!) 116/53   Pulse 61   Temp (!) 97.3 F (36.3 C) (Oral)   Resp 18   Ht 6' 0.99" (1.854 m)   Wt 273 lb 5.9 oz (124 kg)   SpO2 100%   BMI 36.07 kg/m   Physical Exam  NAD, alert, confused Abdomen:  RUQ drain in place.  Insertion site c/d/i.  Small amount of serous output in bulb, ~20 mL recorded today.  Imaging: Ct Abdomen Pelvis W Contrast  Addendum Date: 05/25/2017   ADDENDUM REPORT: 05/25/2017 11:47 ADDENDUM: The original report was by Dr. Van Clines. The following addendum is by Dr. Van Clines: Critical Value/emergent results were called by telephone at the time of interpretation on 05/25/2017 at 11:44 am to Dr. Gretta Arab, who verbally acknowledged these results. Electronically Signed   By: Van Clines M.D.   On: 05/25/2017 11:47   Result Date: 05/25/2017 CLINICAL DATA:  Postoperative infection. Abdominal tube was retracted forcefully. EXAM: CT ABDOMEN AND PELVIS WITH CONTRAST TECHNIQUE: Multidetector CT imaging of the abdomen and pelvis was performed using the standard protocol following bolus administration of  intravenous contrast. CONTRAST:  139mL OMNIPAQUE IOHEXOL 300 MG/ML  SOLN COMPARISON:  05/19/2017 FINDINGS: Lower chest: Small right and trace left pleural effusions with passive atelectasis. There is some additional mild atelectasis in the right middle lobe. Mild cardiomegaly. Hepatobiliary: Motion artifact and streak artifact from arm positioning reduced sensitivity and specificity assessing the liver. At least 2 gallstones are present, the larger measuring 1.5 cm in diameter. No obvious hepatic abnormality observed observed. Pancreas: Unremarkable Spleen: Unremarkable Adrenals/Urinary Tract: Adrenal glands normal. Severely atrophic left kidney. Bilateral renal hypodense lesions are likely cysts, although 1 of these exophytic lesions on image 45/3 measuring 1.7 by 1.1 cm is mildly hyperdense and accordingly potentially a complex cyst or mass. This has roughly similar density characteristics to the precontrast exam of 05/19/2017 performed on the same CT scanner. A Foley catheter is present in the urinary bladder. Stomach/Bowel: Colorectal anastomosis is again observed. Currently there is only a trace amount of contained extraluminal gas tracking in the adjacent fascia plane, compared to the larger amount on 05/19/2017. However, there continues to be a considerable amount of free air in the belly, such that residual perforation is not excluded. Descending and sigmoid colon diverticulosis. 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 The stent extends down into the common iliac arteries, excluding a small abdominal aortic aneurysm. There is tenting of the celiac trunk and SMA which appear patent, and stenting of the patent right renal artery. 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 Reproductive: The prostate gland has a slightly lobular contour. Other: Acute right retroperitoneal hemorrhage primarily along the lateral and anterior margin of the right psoas muscle and between the right psoas and iliacus muscles, new compared to 05/19/17. This has multiple components with volume of he hematoma estimated at 280 cubic cm. I do not see definite active extravasation of contrast medium. Laparotomy wound healing by secondary intention noted. Right-sided ostomy site noted. There is a considerable new amount of ascites especially in the perihepatic region, containing locules of free intraperitoneal gas. There is also some new collected ascites loculated anteriorly in the left upper quadrant, and scattered pelvic ascites. Presacral edema similar to prior. Worsened subcutaneous edema especially along the flanks and upper thighs, and greater on the left than the right. Musculoskeletal: Thoracic and lumbar spondylosis with lumbar degenerative disc disease, and notable foraminal impingement bilaterally at L3-4, L4-5, and L5-S1. IMPRESSION: 1. Acute right retroperitoneal hemorrhage along the margin of the psoas and iliacus muscles is new compared to the 05/19/2017 exam, volume of hematoma approximately 280 cubic cm. No directly visualized active extravasation on today's exam. 2. Worsening ascites especially in the perihepatic region, with free intraperitoneal gas and loculated gas within the ascites. However, the amount of mesenteric gas immediately adjacent to the ileo colic anastomosis has focally decreased compared to previous. 3. Small right and trace left pleural effusions with passive atelectasis. 4. Stable appearance of stent graft traversing thoracic and abdominal aortic aneurysms and extending into the iliac vessels. 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 5. Other imaging findings of potential clinical significance: Mild  cardiomegaly. Cholelithiasis. Aortic Atherosclerosis (ICD10-I70.0). Aortic aneurysm NOS (ICD10-I71.9). Severely atrophic left kidney. Stable renal cystic lesions including a complex lesion of the right mid kidney which is most likely a complex cyst rather than a mass. Lobular prostate gland. Laparotomy wound healing by secondary intention. Right-sided ostomy. Worsened subcutaneous edema along the flanks and upper thighs. Multilevel lower lumbar foraminal impingement. Radiology assistant personnel have been notified to put me in telephone contact with the referring physician or the referring physician's clinical representative in order to discuss these findings. Once this communication is established I will issue an addendum to this report for documentation purposes. Electronically Signed: By: Van Clines M.D. On: 05/25/2017 11:20   Ct Image Guided Drainage By Percutaneous Catheter  Result Date: 05/26/2017 INDICATION: History of colon cancer, post LAR with primary colorectal anastomosis and diverting loop ileostomy with umbilical hernia repair on 05/11/2017 by Dr. Georgette Dover found to have postoperative pneumoperitoneum and perihepatic fluid on abdominal CT performed 05/25/2017 for which request made for placement of a percutaneous drainage catheter for infection source control purposes. EXAM: CT IMAGE GUIDED DRAINAGE BY PERCUTANEOUS CATHETER COMPARISON:  CT abdomen and pelvis - 05/25/2017; 05/19/2013 MEDICATIONS: The patient is currently admitted to the hospital and receiving intravenous antibiotics. The antibiotics were administered within an appropriate time frame prior to the initiation of the procedure. ANESTHESIA/SEDATION: Moderate (conscious) sedation was employed during this procedure. A total of Versed 1 mg and Fentanyl 50 mcg was administered intravenously. Moderate Sedation Time: 14 minutes. The patient's level of consciousness and vital signs were monitored continuously by radiology nursing  throughout the procedure under my direct supervision. CONTRAST:  None COMPLICATIONS: None  immediate. PROCEDURE: Informed written consent was obtained from patient's family after a discussion of the risks, benefits and alternatives to treatment. The patient was placed supine on the CT gantry and a pre procedural CT was performed re-demonstrating the known abscess/fluid collection within the perihepatic space. The procedure was planned. A timeout was performed prior to the initiation of the procedure. The skin overlying the ventral aspect the right upper abdomen was prepped and draped in the usual sterile fashion. The overlying soft tissues were anesthetized with 1% lidocaine with epinephrine. Appropriate trajectory was planned with the use of a 22 gauge spinal needle. An 18 gauge trocar needle was advanced into the abscess/fluid collection and a short Amplatz super stiff wire was coiled within the collection. Appropriate positioning was confirmed with a limited CT scan. The tract was serially dilated allowing placement of a 10 [place] all-purpose drainage catheter. Appropriate positioning was confirmed with a limited postprocedural CT scan. Approximately 10 ml of dark yellow fluid was aspirated. The tube was connected to a JP bulb and sutured in place. A dressing was placed. The patient tolerated the procedure well without immediate post procedural complication. IMPRESSION: Successful CT guided placement of a 10 French all purpose drain catheter into the perihepatic air in fluid collection with aspiration of 10 mL of dark yellow fluid. Samples were sent to the laboratory as requested by the ordering clinical team. Electronically Signed   By: Sandi Mariscal M.D.   On: 05/26/2017 13:21    Labs:  CBC: Recent Labs    05/23/17 0356 05/25/17 1222 05/26/17 0426 05/27/17 0423  WBC 11.0* 14.4* 17.0* 20.0*  HGB 8.6* 6.7* 8.7* 9.1*  HCT 26.9* 22.4* 26.6* 28.3*  PLT 174 232 268 320    COAGS: Recent Labs     05/19/17 0050 05/25/17 1638  INR 1.60 1.28    BMP: Recent Labs    05/24/17 0339 05/25/17 0400 05/26/17 0426 05/27/17 0423  NA 134* 138 139 140  K 5.1 4.2 3.4* 3.5  CL 109 110 107 111  CO2 20* 21* 22 23  GLUCOSE 264* 154* 185* 182*  BUN 23* 24* 27* 24*  CALCIUM 7.4* 7.5* 7.2* 7.2*  CREATININE 1.03 0.94 1.04 0.95  GFRNONAA >60 >60 >60 >60  GFRAA >60 >60 >60 >60    LIVER FUNCTION TESTS: Recent Labs    05/07/17 0332 05/20/17 0348 05/23/17 0356 05/26/17 0426  BILITOT 1.2 1.1 0.6 0.8  AST 14* 85* 34 51*  ALT 7* 50 20 38  ALKPHOS 44 76 56 110  PROT 6.7 4.8* 4.2* 4.7*  ALBUMIN 2.2* 1.2* <1.0* 1.2*    Assessment and Plan: Perihepatic fluid collection  Patient s/p peri-hepatic fluid aspiration and drain placement yesterday.  Small amount of serous drainage. WBC increasing today; abdominal wound dehiscence.  IR to follow.  Electronically Signed: Docia Barrier, PA 05/27/2017, 4:49 PM   I spent a total of 15 Minutes at the the patient's bedside AND on the patient's hospital floor or unit, greater than 50% of which was counseling/coordinating care for peri-hepatic fluid collection.

## 2017-05-27 NOTE — Progress Notes (Signed)
PULMONARY / CRITICAL CARE MEDICINE   Name: Jesse Le MRN: [id number] DOB: [birth year]    ADMISSION DATE:  05/06/2017 CONSULTATION DATE:  05/18/2017  REFERRING MD:  Dr. Terrence Dupont  CHIEF COMPLAINT:  Hypotension  HISTORY OF PRESENT ILLNESS:   72 yo male presented with abdominal pain, weight loss and intermittent hematochezia.  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  Had low anterior resection with primary anastomosis with diverting loop ileostomy.  Developed hypotension, hypoglycemia 5/07.  Developed hypotension and altered mental status 5/08 and transferred to ICU. Pt has an anastomotic leak, drain placed by surgery PMHx of CAD, DM, TAA.  5/13 The patient is awake and tracking but not really verbal. BP is running a bit high. 170-180. Patient remains ion a small dose of precedex at 0.87mcg/kg. respiratotory status appears comfortable. Does not appear to be onpressors at this time.  5/14 Little overall change in status The patient rermains lethargic and non-communicative. BP has been high requiring nicardipine drip in addition to prn labetalol and hydralazine Discussed the situation with surgery. The patient had pulled out his RLQ drain. A new CT scan ihas been ordered and he may need drain replaced.  5/15 The patient is awake and up in the chair. He went down for a CT scan today. There was less extraluminal gas in the area of the anastomosis. There is increasing ascities. A new spontaneous right retroperitoneal hematoma was noted as well. The patient remains hemodynamically stable. He is more awake and speaking to his girfriend.  5/17 The patient is awake. He appears to be a little tearful. Whe he speaks it is vvery difficult for me to understand him. He apparently  Was a bit delirious overnight. He is still requiring Precedex in varying dose. We don't have the ability yest to substitute an oral agent .    VITAL SIGNS: BP (!) 117/49   Pulse (!) 52    Temp (!) 97.5 F (36.4 C) (Oral)   Resp 15   Ht 6' 0.99" (1.854 m)   Wt 273 lb 5.9 oz (124 kg)   SpO2 96%   BMI 36.07 kg/m   INTAKE / OUTPUT: I/O last 3 completed shifts: In: 6835.3 [I.V.:5560.3; Blood:315; IV Piggyback:960] Out: 4696 [Urine:4290; Drains:63]  PHYSICAL EXAMINATION:  General -obese man, no distress Eyes -equal, reactive ENT -port intention, oropharynx clear Cardiac -regular, no murmur, mild pretibial edema Chest -few scattered bilateral inspiratory crackles Abd -ileostomy   JP drain wa s removed by the patient two days agoAbdominal binder.Abdomen appears more distended Ext - 2+ lower extremity edema Skin -no rash Neuro -awake, somewhat sleepy, Not really seaking at this time current agitation   LABS:  BMET Recent Labs  Lab 05/25/17 0400 05/26/17 0426 05/27/17 0423  NA 138 139 140  K 4.2 3.4* 3.5  CL 110 107 111  CO2 21* 22 23  BUN 24* 27* 24*  CREATININE 0.94 1.04 0.95  GLUCOSE 154* 185* 182*    Electrolytes Recent Labs  Lab 05/23/17 0356 05/24/17 0339 05/25/17 0400 05/26/17 0426 05/27/17 0423  CALCIUM 7.3* 7.4* 7.5* 7.2* 7.2*  MG 2.2 2.1  --  1.6* 1.8  PHOS 4.6 3.3  --  3.2  --     CBC Recent Labs  Lab 05/25/17 1222 05/26/17 0426 05/27/17 0423  WBC 14.4* 17.0* 20.0*  HGB 6.7* 8.7* 9.1*  HCT 22.4* 26.6* 28.3*  PLT 232 268 320    Coag's Recent Labs  Lab 05/25/17 1638  INR  1.28    Sepsis Markers Recent Labs  Lab 05/22/17 0300  LATICACIDVEN 1.7    ABG Recent Labs  Lab 05/22/17 0254  PHART 7.405  PCO2ART 27.2*  PO2ART 73.0*    Liver Enzymes Recent Labs  Lab 05/23/17 0356 05/26/17 0426  AST 34 51*  ALT 20 38  ALKPHOS 56 110  BILITOT 0.6 0.8  ALBUMIN <1.0* 1.2*    Cardiac Enzymes No results for input(s): TROPONINI, PROBNP in the last 168 hours.  Glucose Recent Labs  Lab 05/26/17 1208 05/26/17 1527 05/26/17 2024 05/27/17 0001 05/27/17 0428 05/27/17 0812  GLUCAP 204* 211* 180* 145* 154* 219*     Imaging Ct Image Guided Drainage By Percutaneous Catheter  Result Date: 05/26/2017 INDICATION: History of colon cancer, post LAR with primary colorectal anastomosis and diverting loop ileostomy with umbilical hernia repair on 05/11/2017 by Dr. Georgette Dover found to have postoperative pneumoperitoneum and perihepatic fluid on abdominal CT performed 05/25/2017 for which request made for placement of a percutaneous drainage catheter for infection source control purposes. EXAM: CT IMAGE GUIDED DRAINAGE BY PERCUTANEOUS CATHETER COMPARISON:  CT abdomen and pelvis - 05/25/2017; 05/19/2013 MEDICATIONS: The patient is currently admitted to the hospital and receiving intravenous antibiotics. The antibiotics were administered within an appropriate time frame prior to the initiation of the procedure. ANESTHESIA/SEDATION: Moderate (conscious) sedation was employed during this procedure. A total of Versed 1 mg and Fentanyl 50 mcg was administered intravenously. Moderate Sedation Time: 14 minutes. The patient's level of consciousness and vital signs were monitored continuously by radiology nursing throughout the procedure under my direct supervision. CONTRAST:  None COMPLICATIONS: None immediate. PROCEDURE: Informed written consent was obtained from patient's family after a discussion of the risks, benefits and alternatives to treatment. The patient was placed supine on the CT gantry and a pre procedural CT was performed re-demonstrating the known abscess/fluid collection within the perihepatic space. The procedure was planned. A timeout was performed prior to the initiation of the procedure. The skin overlying the ventral aspect the right upper abdomen was prepped and draped in the usual sterile fashion. The overlying soft tissues were anesthetized with 1% lidocaine with epinephrine. Appropriate trajectory was planned with the use of a 22 gauge spinal needle. An 18 gauge trocar needle was advanced into the abscess/fluid  collection and a short Amplatz super stiff wire was coiled within the collection. Appropriate positioning was confirmed with a limited CT scan. The tract was serially dilated allowing placement of a 10 [place] all-purpose drainage catheter. Appropriate positioning was confirmed with a limited postprocedural CT scan. Approximately 10 ml of dark yellow fluid was aspirated. The tube was connected to a JP bulb and sutured in place. A dressing was placed. The patient tolerated the procedure well without immediate post procedural complication. IMPRESSION: Successful CT guided placement of a 10 French all purpose drain catheter into the perihepatic air in fluid collection with aspiration of 10 mL of dark yellow fluid. Samples were sent to the laboratory as requested by the ordering clinical team. Electronically Signed   By: Sandi Mariscal M.D.   On: 05/26/2017 13:21     STUDIES:  CT abd 4/26 >> 9 cm mass CT abd 5/09 >> discontinuity in the posterior wall just above the anastomosis with feculent appearing material and gas in the pelvis, measuring up to 5x 3 cm anterior and superior to the sigmoid colon.  Fluid collection right lower quadrant measuring up to 6.4 x 3.5 cm. Moderate pneumoperitoneum.  CULTURES: Blood culture 5/8 >  ANTIBIOTICS: Zosyn 5/7 > 5/8 Zosyn 5/10>> Unasyn 5/8 > 5/10 Vancomycin 5/7 > Eraxis 5/8 >  SIGNIFICANT EVENTS: 4/26 admit for abdominal pain. Mass identified on CT 4/28 colonoscopy. Biopsy sent.  5/1 bx grows adeno. To OR for resection ileostomy 5/8 hypotension hypoglycemia AMS, transfer to ICU 5/16: Large volume paracentesis  LINES/TUBES: Lt IJ CVL  5/8 > JP Drain 5/1>  DISCUSSION: 72 yo male with sepsis from peritonitis after surgery for colon cancer and CT abd +  for anastomotic leak.>> Drain placed 5/1  ASSESSMENT / PLAN:  Sepsis with septic shock from peritonitis.  Restarted on norepinephrine, overall volume resuscitation is been 6-1/2 L over the last 3 days Colon  cancer s/p low anterior resection with ileostomy. Continue current Zosyn, vancomycin, Eraxis JP drain in place appears to be draining purulent fluid IV fluid resuscitation, attempt to wean norepinephrine as able Restart stress dose steroids (had been weaning). It appears that the patient is doing better hemodynamically at this time.   Discussed the plan with surgery re new CT scan and possible new drain placement. The patient is hemodynamically stable. He has been off pressors for 2 days. Surgery spoke to me and they plan on asking IR to do a poaracetesis foir diagnostic and therapeutic purposes. 5/17 Patient s/p diagnostic paracentesis There were few GPCs  And rare GNR on the gram stain  Systolic HTN BP has been hard to control. The patient is on a cardene drip at 10mg /hr. In addition there are prn orders for hydralazine and labetalol. Bp is better presently. 5/17 The patient requires Cardene for bp control and is on 12mg /hr.     Acute renal failure from ATN.   Oliguria improved slightly over last 24 hours  Renal fn. Better. Creatinine is now down to 0.95. The patient had 2478 out in the poast 24 hours.    DM type II. Hypoglycemia in setting of sepsis, continues to be a problem. Scheduled CBG Restart stress dose steroids, question some degree of adrenal insufficiency contributing to his hypoglycemia Replacing dextrose aggressively in his TPN, discussed with pharmacy on 5/12. Blood sugars are vacillatinmg quite a bit lately. May be up lately due to stress dose steroids. Blood suagar remain a boit elevated. I think we can stop exogenous steroids at thsi toime  That might help with that.  Acute metabolic encephalopathy 2nd to sepsis. Improving Follow mental status off of Precedex Minimize sedating medications Continue to reorient  Relative adrenal insufficiency. Hydrocortisone back to 50 mg every 6 hours. I have decreased hydrocort to q 12h. I am going to stop steroiids at this  time  CXR persisistent small opacity at the left base. No mjaor cough 02 sats on nasal cannula in the high 90s. DVT prophylaxis - lovenox SUP - pepcid Nutrition - NPO + TPN Goals of care - full code   Micheal Likens MD [facility]

## 2017-05-27 NOTE — Progress Notes (Addendum)
PHARMACY - ADULT TOTAL PARENTERAL NUTRITION CONSULT NOTE   Pharmacy Consult:  TPN Indication: Prolonged ileus  Patient Measurements: Height: 6' 0.99" (185.4 cm) Weight: 273 lb 5.9 oz (124 kg) IBW/kg (Calculated) : 79.88 TPN AdjBW (KG): 94.5 Body mass index is 36.07 kg/m.  Assessment:  63 YOM with PMH significant for IDA with recent rectal bleed, PVD, CAD, HTN, HLD, COPD, and CKD, found to have rectosigmoid colon adenocarcinoma mass s/p lower anterior resection with primary colorectal anastomosis and diverting loop ileostomy with umbilical hernia repair on 05/11/17.  Developed anastomotic leak with feculent output in JP drain. Patient currently with high ileostomy output. Patient's abdomen has become more distended and patient was made NPO. Pharmacy consulted for TPN.   Patient was on clear liquids (on and off NPO as well) but only tolerating minimal intake (<25%) for >10 days. Patient is at risk for refeeding.   GI: prealbumin < 5, no ileostomy O/P, drain O/P 43mL.  Pepcid in TPN.   Endo: no hx DM - hypoglycemic prior to TPN, then elevated with TPN + steroid.  Unsure why CBGs elevated only at certain times during the day.  HC for possible AI - weaned to 50mg  IV BID Insulin requirements in the past 24 hours: 26 units SSI + 15 units in TPN Lytes: K low normal at 3.5 (goal >/= 4 for ileus), Mag 1.8 (goal >/= 2 for ileus), all other normal Renal: SCr 0.95 (stable), BUN 24 - UOP 0.8 ml/kg/hr, +10.7L since admit Pulm: RA >> 2L [place] Cards: BP normal, brady, CVP 8 - nicardipine gtt, IV metoprolol, Lasix 40mg  IV daily, PRN hydralazine.  Marked volume overload per Cards. Hepatobil: LFTs / tbili / TG WNL Heme: CT shows spontaneous RTP hematoma - hgb up to 9.1, plts WNL Neuro: Precedex gtt, Oxycodone, PRN Dilaudid/Benadryl/Haldol - GCS 14, CPOT 0-2, RASS 1  ID: Vanc/Zosyn/Eraxis for peritonitis. Wound re-opened and packed on 5/12.  Perihepatic fluid drained and cx sent.  Afebrile, WBC up to 20 TPN  Access: CVC triple lumen placed 05/18/17 TPN start date: 05/20/17  Nutritional Goals (per RD rec on 5/9): 2100-2400 kCal, 105-120g protein, 2.1- 2.4 L per day  Current Nutrition:  TPN   Plan:  Concentrate TPN d/t volume overload and start daily ILE given day #8 of TPN TPN at 70 ml/hr will provide 116g AA, 370g CHO and 52g ILE for a total of 2242 kCal, meeting 100% of patient's needs. Electrolytes in TPN: Na increased 5/16, increase K (92 mEq/d), increase Mag, Cl:Ac 1:1 - lytes fluctuation d/t adjusted TPN rate Daily multivitamin and trace elements in TPN Continue resistant SSI Q4H + 15 units regular insulin in TPN (less CHO in TPN) Pepcid 40mg  daily in TPN F/U AM labs, CBGs/SSI use  KCL x 6 runs for daily Lasix Repeat Mag sulfate 2gm IV x 1   Gunter Conde D. Mina Marble, PharmD, BCPS, BCCCP Pager:  [phone] 05/27/2017, 7:46 AM

## 2017-05-27 NOTE — Progress Notes (Signed)
Subjective:  Patient is awake hemodynamically stable but continues. Underwent drainage of peri hepatic collection yesterday and placement of 10 French drainage catheter.WBCs continues to increase. Preliminary culture positive for gram-positive cocci rare gram-negative rods and gram-positive rods.  Objective:  Vital Signs in the last 24 hours: Temp:  [97.5 F (36.4 C)-98.2 F (36.8 C)] 97.5 F (36.4 C) (05/17 0817) Pulse Rate:  [47-77] 54 (05/17 1100) Resp:  [14-24] 21 (05/17 1100) BP: (107-137)/(49-68) 125/52 (05/17 1100) SpO2:  [93 %-100 %] 96 % (05/17 1100) Arterial Line BP: (132-184)/(40-111) 142/41 (05/17 1100) Weight:  [124 kg (273 lb 5.9 oz)] 124 kg (273 lb 5.9 oz) (05/16 1551)  Intake/Output from previous day: 05/16 0701 - 05/17 0700 In: 4475.1 [I.V.:3865.1; IV Piggyback:610] Out: 2478 [Urine:2415; Drains:63] Intake/Output from this shift: Total I/O In: 492.9 [I.V.:492.9] Out: 410 [Urine:400; Drains:10]  Physical Exam: Neck: no adenopathy, no carotid bruit, no JVD and supple, symmetrical, trachea midline Lungs: Clear anteriorly Heart: regular rate and rhythm, S1, S2 normal and Soft systolic murmur noted Abdomen: Distended and surgical dressing noted ileostomy site okay Extremities: No clubbing cyanosis 2+ edema noted  Lab Results: Recent Labs    05/26/17 0426 05/27/17 0423  WBC 17.0* 20.0*  HGB 8.7* 9.1*  PLT 268 320   Recent Labs    05/26/17 0426 05/27/17 0423  NA 139 140  K 3.4* 3.5  CL 107 111  CO2 22 23  GLUCOSE 185* 182*  BUN 27* 24*  CREATININE 1.04 0.95   No results for input(s): TROPONINI in the last 72 hours.  Invalid input(s): CK, MB Hepatic Function Panel Recent Labs    05/26/17 0426  PROT 4.7*  ALBUMIN 1.2*  AST 51*  ALT 38  ALKPHOS 110  BILITOT 0.8   No results for input(s): CHOL in the last 72 hours. No results for input(s): PROTIME in the last 72 hours.  Imaging: Imaging results have been reviewed and Ct Image Guided  Drainage By Percutaneous Catheter  Result Date: 05/26/2017 INDICATION: History of colon cancer, post LAR with primary colorectal anastomosis and diverting loop ileostomy with umbilical hernia repair on 05/11/2017 by Dr. Georgette Dover found to have postoperative pneumoperitoneum and perihepatic fluid on abdominal CT performed 05/25/2017 for which request made for placement of a percutaneous drainage catheter for infection source control purposes. EXAM: CT IMAGE GUIDED DRAINAGE BY PERCUTANEOUS CATHETER COMPARISON:  CT abdomen and pelvis - 05/25/2017; 05/19/2013 MEDICATIONS: The patient is currently admitted to the hospital and receiving intravenous antibiotics. The antibiotics were administered within an appropriate time frame prior to the initiation of the procedure. ANESTHESIA/SEDATION: Moderate (conscious) sedation was employed during this procedure. A total of Versed 1 mg and Fentanyl 50 mcg was administered intravenously. Moderate Sedation Time: 14 minutes. The patient's level of consciousness and vital signs were monitored continuously by radiology nursing throughout the procedure under my direct supervision. CONTRAST:  None COMPLICATIONS: None immediate. PROCEDURE: Informed written consent was obtained from patient's family after a discussion of the risks, benefits and alternatives to treatment. The patient was placed supine on the CT gantry and a pre procedural CT was performed re-demonstrating the known abscess/fluid collection within the perihepatic space. The procedure was planned. A timeout was performed prior to the initiation of the procedure. The skin overlying the ventral aspect the right upper abdomen was prepped and draped in the usual sterile fashion. The overlying soft tissues were anesthetized with 1% lidocaine with epinephrine. Appropriate trajectory was planned with the use of a 22 gauge spinal needle. An  18 gauge trocar needle was advanced into the abscess/fluid collection and a short Amplatz super  stiff wire was coiled within the collection. Appropriate positioning was confirmed with a limited CT scan. The tract was serially dilated allowing placement of a 10 [place] all-purpose drainage catheter. Appropriate positioning was confirmed with a limited postprocedural CT scan. Approximately 10 ml of dark yellow fluid was aspirated. The tube was connected to a JP bulb and sutured in place. A dressing was placed. The patient tolerated the procedure well without immediate post procedural complication. IMPRESSION: Successful CT guided placement of a 10 French all purpose drain catheter into the perihepatic air in fluid collection with aspiration of 10 mL of dark yellow fluid. Samples were sent to the laboratory as requested by the ordering clinical team. Electronically Signed   By: Sandi Mariscal M.D.   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 Status postAcute on chronic renal injury secondary to hypotension/sepsis. 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 Check labs and culture results.   LOS: 21 days    Charolette Forward 05/27/2017, 11:53 AM

## 2017-05-27 NOTE — Progress Notes (Addendum)
Physical Therapy Treatment Patient Details Name: Jesse Le MRN: [id number] DOB: December 28, 1945 Today's Date: 05/27/2017    History of Present Illness Pt is a 72 y.o. 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  He developed sepsis with septic shock from peritonitis. PMH includes HTN, DM, COPD, aortic aneurysm, CKD II, obesity.     PT Comments    Patient able to participate with therapies today. With less confusion, some agitation at times and ability to have light conversation. Oriented to person only.  Tolerating rolling to fit new abdominal binder, and standing with min Ax2 into Stedy for transfer to bedside chair. Patient reported fatigue after 3-4 minutes of standing inside [place]. Lift pad left under patient in chair in the event he is too fatigued to utilize Stedy back. RN present and assisted with line mgt. VSS throughout entire session.     Follow Up Recommendations  SNF;Supervision/Assistance - 24 hour     Equipment Recommendations  (TBD in next venue)    Recommendations for Other Services       Precautions / Restrictions Precautions Precautions: Fall Precaution Comments: ileostomy bag, open abdominal wound; JP drain.   Required Braces or Orthoses: Other Brace/Splint Other Brace/Splint: abdominal binder  Restrictions Weight Bearing Restrictions: No    Mobility  Bed Mobility Overal bed mobility: Needs Assistance Bed Mobility: Rolling;Sidelying to Sit Rolling: Mod assist;+2 for safety/equipment;+2 for physical assistance Sidelying to sit: Min assist;+2 for physical assistance;+2 for safety/equipment       General bed mobility comments: Pt initially resisted rolling, and required assist to initiate movement.  Status varied between min - mod A +2.  Pt required assist to initiate moving LEs off the bed and min A to lift trunk   Transfers Overall  transfer level: Needs assistance Equipment used: Ambulation equipment used Transfers: Sit to/from [employer] Transfers Sit to Stand: Min assist;+2 physical assistance;+2 safety/equipment Stand pivot transfers: Min assist;+2 physical assistance;+2 safety/equipment       General transfer comment: min A x2 to move into standing, pt able to intiate movements into [place] with mittens aware of how to use Stedy during transfer. tolerated standing for 3-70minutes before reporting he was fatiguing   Ambulation/Gait             General Gait Details: unable at this time   Stairs             Wheelchair Mobility    Modified Rankin (Stroke Patients Only)       Balance Overall balance assessment: Needs assistance Sitting-balance support: Feet supported Sitting balance-[name]: Fair Sitting balance - Comments: EOB sitting with close min guard assist    Standing balance support: Bilateral upper extremity supported Standing balance-[name]: Poor Standing balance comment: Reliant on UE support                             Cognition Arousal/Alertness: Awake/alert;Lethargic Behavior During Therapy: Flat affect Overall Cognitive Status: Impaired/Different from baseline Area of Impairment: Orientation;Attention;Following commands                 Orientation Level: Disoriented to;Place;Time;Situation Current Attention Level: Focused   Following Commands: Follows one step commands consistently;Follows one step commands with increased time Safety/Judgement: Decreased awareness of safety;Decreased awareness of deficits Awareness: Emergent Problem Solving: Slow processing;Difficulty sequencing;Requires verbal cues;Requires tactile cues General Comments: Pt currently on Precedex.  Confused.  He has been agitated, and had mittens and wrist restraints in place       Exercises General Exercises - Lower Extremity Long Arc Quad: 10 reps    General  Comments General comments (skin integrity, edema, etc.): VSS on 1.5L O2 [place] this session      Pertinent Vitals/Pain Pain Assessment: Faces Faces Pain Scale: Hurts a little bit Pain Location: Abdomen Pain Descriptors / Indicators: Sore Pain Intervention(s): Limited activity within patient's tolerance;Monitored during session;Premedicated before session;Repositioned    Home Living                      Prior Function            PT Goals (current goals can now be found in the care plan section) Acute Rehab PT Goals PT Goal Formulation: With patient Time For Goal Achievement: 06/10/17 Potential to Achieve Goals: Fair Progress towards PT goals: Progressing toward goals    Frequency    Min 2X/week      PT Plan Current plan remains appropriate    Co-evaluation   Reason for Co-Treatment: Complexity of the patient's impairments (multi-system involvement)   OT goals addressed during session: ADL's and self-care;Proper use of Adaptive equipment and DME;Strengthening/ROM      AM-PAC PT "6 Clicks" Daily Activity  Outcome Measure  Difficulty turning over in bed (including adjusting bedclothes, sheets and blankets)?: Unable Difficulty moving from lying on back to sitting on the side of the bed? : Unable Difficulty sitting down on and standing up from a chair with arms (e.g., wheelchair, bedside commode, etc,.)?: Unable Help needed moving to and from a bed to chair (including a wheelchair)?: A Lot Help needed walking in hospital room?: Total Help needed climbing 3-5 steps with a railing? : Total 6 Click Score: 7    End of Session Equipment Utilized During Treatment: Gait belt Activity Tolerance: Patient limited by fatigue Patient left: in chair;with chair alarm set;with nursing/sitter in room Nurse Communication: Mobility status PT Visit Diagnosis: Other abnormalities of gait and mobility (R26.89)     Time: 7782-4235 PT Time Calculation (min) (ACUTE ONLY): 53  min  Charges:  $Therapeutic Activity: 23-37 mins                    G Codes:       Reinaldo Berber, PT, DPT Acute Rehab Services Pager: [place] 05/27/2017, 8:59 PM

## 2017-05-27 NOTE — Evaluation (Signed)
Clinical/Bedside Swallow Evaluation Patient Details  Name: Jesse Le MRN: [id number] Date of Birth: 10/23/1945  Today's Date: 05/27/2017 Time: SLP Start Time (ACUTE ONLY): 1330 SLP Stop Time (ACUTE ONLY): 1430 SLP Time Calculation (min) (ACUTE ONLY): 60 min  Past Medical History:  Past Medical History:  Diagnosis Date  . Aneurysm of aorta (HCC)   . Coronary artery disease   . Diabetes mellitus without complication ([place])    NEW ONSET 09/2012  . Hypertension   . Myocardial infarct [facility])    Past Surgical History:  Past Surgical History:  Procedure Laterality Date  . ABDOMINAL AORTIC ANEURYSM REPAIR  10-14-11   TEVAR  . COLON RESECTION SIGMOID N/A 05/11/2017   Procedure: Colon Resection Sigmoid Low Anterior Resection with Staple Colon Relast;  Surgeon: Donnie Mesa, MD;  Location: [place];  Service: General;  Laterality: N/A;  . COLONOSCOPY WITH PROPOFOL N/A 05/08/2017   Procedure: COLONOSCOPY WITH PROPOFOL;  Surgeon: Jerene Bears, MD;  Location: [place];  Service: Gastroenterology;  Laterality: N/A;  . ILEOSTOMY N/A 05/11/2017   Procedure: Creation Vertical Loop ILEOSTOMY;  Surgeon: Donnie Mesa, MD;  Location: [place];  Service: General;  Laterality: N/A;  . INTRAVASCULAR ULTRASOUND  10/14/2011   Procedure: INTRAVASCULAR ULTRASOUND;  Surgeon: Serafina Mitchell, MD;  Location: [place];  Service: Vascular;  Laterality: N/A;  . UMBILICAL HERNIA REPAIR N/A 05/11/2017   Procedure: HERNIA REPAIR UMBILICAL ADULT;  Surgeon: Donnie Mesa, MD;  Location: [place];  Service: General;  Laterality: N/A;  . VIDEO BRONCHOSCOPY  10/14/2011   Procedure: VIDEO BRONCHOSCOPY;  Surgeon: Serafina Mitchell, MD;  [facility];  Service: Vascular;  Laterality: N/A;   HPI:  72 year old male admitted 05/06/17 with abdominal pain. 9 cm colon mass found. PMH: CAD, COPD, HTN, DM, thoracic aneurysm, CKD2, obesity. anemia, MI. LLL opacity   Assessment / Plan / Recommendation Clinical Impression  Aggressive oral care  was completed, due to significant dry secretions caked on back of tongue/throat. Secretions altered pt's voice quality, raising concern for the amount of un-seen material in the pharynx. Pt tolerated single ice chips, which, paired with suction and oral swabs, resulted eventually in removal (most swallowed, I believe) of dried secretions. Oropharynx appeared pink and healthy following oral care. Pt indicated his mouth and throat felt much better afterwards.   Pt then accepted trials of puree, thin liquid, and graham cracker. Extended oral prep of cracker, likely due to poor dentition. Otherwise, no oral issues noted. Pt exhibited no overt s/s aspiration during po trials, but was noted to cough several times following this evaluation, several minutes after po trials had stopped.  Recommend beginning puree diet and thin liquids when OK with surgery. SLP will follow closely to assess diet tolerance, provide continuing education, and determine readiness to advance or need for objective assessment. Pt reports no history of dysphagia. Safe swallow precautions were posted at [facility]. RN informed.   SLP Visit Diagnosis: Dysphagia, unspecified (R13.10)    Aspiration Risk  Mild aspiration risk;Moderate aspiration risk    Diet Recommendation Dysphagia 1 (Puree);Thin liquid   Liquid Administration via: Cup;Straw Medication Administration: Whole meds with liquid Supervision: Staff to assist with self feeding;Full supervision/cueing for compensatory strategies Compensations: Minimize environmental distractions;Small sips/bites Postural Changes: Seated upright at 90 degrees;Remain upright for at least 30 minutes after po intake    Other  Recommendations Oral Care Recommendations: Oral care QID;Staff/trained caregiver to provide oral care Other Recommendations: Have oral suction available   Follow up  Recommendations (TBD)      Frequency and Duration min 1 x/week  1 week;2 weeks       Prognosis Prognosis  for Safe Diet Advancement: Good Barriers to Reach Goals: Motivation      Swallow Study   General Date of Onset: 05/06/17 HPI: 72 year old male admitted 05/06/17 with abdominal pain. 9 cm colon mass found. PMH: CAD, COPD, HTN, DM, thoracic aneurysm, CKD2, obesity. anemia, MI. LLL opacity Type of Study: Bedside Swallow Evaluation Previous Swallow Assessment: no prior ST intervention Diet Prior to this Study: Dysphagia 1 (puree);Thin liquids Temperature Spikes Noted: No Respiratory Status: Nasal cannula History of Recent Intubation: No Behavior/Cognition: Alert;Cooperative;Pleasant mood Oral Cavity Assessment: Dried secretions;Excessive secretions Oral Care Completed by SLP: Yes Oral Cavity - Dentition: Missing dentition;Poor condition Self-Feeding Abilities: Total assist Patient Positioning: Upright in chair Baseline Vocal Quality: Other (comment)(thick, due to significant dried secretions found in back of mouth/throat) Volitional Cough: Congested Volitional Swallow: Able to elicit    Oral/Motor/Sensory Function Overall Oral Motor/Sensory Function: Within functional limits   Ice Chips Ice chips: Within functional limits Presentation: Spoon   Thin Liquid Thin Liquid: Within functional limits Presentation: Straw    Nectar Thick Nectar Thick Liquid: Not tested   Honey Thick Honey Thick Liquid: Not tested   Puree Puree: Within functional limits Presentation: Spoon   Solid   GO   Solid: Impaired Oral Phase Impairments: Impaired mastication Oral Phase Functional Implications: Prolonged oral transit       Sira Adsit B. Quentin Ore [facility], [place] Speech Language Pathologist [phone]  Shonna Chock 05/27/2017,2:40 PM

## 2017-05-27 NOTE — Progress Notes (Signed)
CSW continuing to follow and assist with disposition when patient becomes stable  Jorge Ny, LCSW Clinical Social Worker [phone]

## 2017-05-27 NOTE — Progress Notes (Signed)
Patient ID: Jesse Le, male   DOB: 1945/08/25, 72 y.o.   MRN: [id number]    16 Days Post-Op  Subjective: Pt was apparently confused overnight night and yelling out some.  He is more calm when i'm in the room and can tell me its 2019 and he's in the hospital which is an improvement from recently, but then begins to talk confused again.  He is thirsty.  Objective: Vital signs in last 24 hours: Temp:  [97.5 F (36.4 C)-98.2 F (36.8 C)] 97.5 F (36.4 C) (05/17 0817) Pulse Rate:  [47-77] 52 (05/17 0700) Resp:  [14-25] 15 (05/17 0700) BP: (107-164)/(46-68) 117/49 (05/17 0700) SpO2:  [93 %-100 %] 96 % (05/17 0700) Arterial Line BP: (132-184)/(40-111) 138/40 (05/17 0700) Weight:  [124 kg (273 lb 5.9 oz)] 124 kg (273 lb 5.9 oz) (05/16 1551) Last BM Date: 05/25/17  Intake/Output from previous day: 05/16 0701 - 05/17 0700 In: 4475.1 [I.V.:3865.1; IV Piggyback:610] Out: 2478 [Urine:2415; Drains:63] Intake/Output this shift: No intake/output data recorded.  PE: Heart: regular, slightly brady this am Lungs: CTAB Abd: soft, NT, obese, ileostomy with minimal succus in bag.  Stoma is pink and viable.  Midline wound continues to dehisce, no overt bowel seen.  JP drain with serous drainage, 63cc noted     Lab Results:  Recent Labs    05/26/17 0426 05/27/17 0423  WBC 17.0* 20.0*  HGB 8.7* 9.1*  HCT 26.6* 28.3*  PLT 268 320   BMET Recent Labs    05/26/17 0426 05/27/17 0423  NA 139 140  K 3.4* 3.5  CL 107 111  CO2 22 23  GLUCOSE 185* 182*  BUN 27* 24*  CREATININE 1.04 0.95  CALCIUM 7.2* 7.2*   PT/INR Recent Labs    05/25/17 1638  LABPROT 15.9*  INR 1.28   CMP     Component Value Date/Time   NA 140 05/27/2017 0423   K 3.5 05/27/2017 0423   CL 111 05/27/2017 0423   CO2 23 05/27/2017 0423   GLUCOSE 182 (H) 05/27/2017 0423   BUN 24 (H) 05/27/2017 0423   CREATININE 0.95 05/27/2017 0423   CALCIUM 7.2 (L) 05/27/2017 0423   PROT 4.7 (L) 05/26/2017 0426   ALBUMIN 1.2  (L) 05/26/2017 0426   AST 51 (H) 05/26/2017 0426   ALT 38 05/26/2017 0426   ALKPHOS 110 05/26/2017 0426   BILITOT 0.8 05/26/2017 0426   GFRNONAA >60 05/27/2017 0423   GFRAA >60 05/27/2017 0423   Lipase     Component Value Date/Time   LIPASE 22 05/06/2017 1036       Studies/Results: Ct Abdomen Pelvis W Contrast  Addendum Date: 05/25/2017   ADDENDUM REPORT: 05/25/2017 11:47 ADDENDUM: The original report was by Dr. Van Clines. The following addendum is by Dr. Van Clines: Critical Value/emergent results were called by telephone at the time of interpretation on 05/25/2017 at 11:44 am to Dr. Gretta Arab, who verbally acknowledged these results. Electronically Signed   By: Van Clines M.D.   On: 05/25/2017 11:47   Result Date: 05/25/2017 CLINICAL DATA:  Postoperative infection. Abdominal tube was retracted forcefully. EXAM: CT ABDOMEN AND PELVIS WITH CONTRAST TECHNIQUE: Multidetector CT imaging of the abdomen and pelvis was performed using the standard protocol following bolus administration of intravenous contrast. CONTRAST:  123mL OMNIPAQUE IOHEXOL 300 MG/ML  SOLN COMPARISON:  05/19/2017 FINDINGS: Lower chest: Small right and trace left pleural effusions with passive atelectasis. There is some additional mild atelectasis in the right middle lobe. Mild cardiomegaly. Hepatobiliary:  Motion artifact and streak artifact from arm positioning reduced sensitivity and specificity assessing the liver. At least 2 gallstones are present, the larger measuring 1.5 cm in diameter. No obvious hepatic abnormality observed observed. Pancreas: Unremarkable Spleen: Unremarkable Adrenals/Urinary Tract: Adrenal glands normal. Severely atrophic left kidney. Bilateral renal hypodense lesions are likely cysts, although 1 of these exophytic lesions on image 45/3 measuring 1.7 by 1.1 cm is mildly hyperdense and accordingly potentially a complex cyst or mass. This has roughly similar density characteristics  to the precontrast exam of 05/19/2017 performed on the same CT scanner. A Foley catheter is present in the urinary bladder. Stomach/Bowel: Colorectal anastomosis is again observed. Currently there is only a trace amount of contained extraluminal gas tracking in the adjacent fascia plane, compared to the larger amount on 05/19/2017. However, there continues to be a considerable amount of free air in the belly, such that residual perforation is not excluded. Descending and sigmoid colon diverticulosis. 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 The stent extends down into the common iliac arteries, excluding a small abdominal aortic aneurysm. There is tenting of the celiac trunk and SMA which appear patent, and stenting of the patent right renal artery. 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 Reproductive: The prostate gland has a slightly lobular contour. Other: Acute right retroperitoneal hemorrhage primarily along the lateral and anterior margin of the right psoas muscle and between the right psoas and iliacus muscles, new compared to 05/19/17. This has multiple components with volume of he hematoma estimated at 280 cubic cm. I do not see definite active extravasation of contrast medium. Laparotomy wound healing by secondary intention noted. Right-sided ostomy site noted. There is a considerable new amount of ascites especially in the perihepatic region, containing locules of free intraperitoneal gas. There is also some new collected ascites loculated anteriorly in the left upper quadrant, and scattered pelvic ascites. Presacral edema similar to prior. Worsened subcutaneous edema especially along the flanks and upper  thighs, and greater on the left than the right. Musculoskeletal: Thoracic and lumbar spondylosis with lumbar degenerative disc disease, and notable foraminal impingement bilaterally at L3-4, L4-5, and L5-S1. IMPRESSION: 1. Acute right retroperitoneal hemorrhage along the margin of the psoas and iliacus muscles is new compared to the 05/19/2017 exam, volume of hematoma approximately 280 cubic cm. No directly visualized active extravasation on today's exam. 2. Worsening ascites especially in the perihepatic region, with free intraperitoneal gas and loculated gas within the ascites. However, the amount of mesenteric gas immediately adjacent to the ileo colic anastomosis has focally decreased compared to previous. 3. Small right and trace left pleural effusions with passive atelectasis. 4. Stable appearance of stent graft traversing thoracic and abdominal aortic aneurysms and extending into the iliac vessels. 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 5. Other imaging findings of potential clinical significance: Mild cardiomegaly. Cholelithiasis. Aortic Atherosclerosis (ICD10-I70.0). Aortic aneurysm NOS (ICD10-I71.9). Severely atrophic left kidney. Stable renal cystic lesions including a complex lesion of the right mid kidney which is most likely a complex cyst rather than a mass. Lobular prostate gland. Laparotomy wound healing by secondary intention. Right-sided ostomy. Worsened subcutaneous edema along the flanks and upper thighs. Multilevel lower lumbar foraminal impingement. Radiology assistant personnel have been  notified to put me in telephone contact with the referring physician or the referring physician's clinical representative in order to discuss these findings. Once this communication is established I will issue an addendum to this report for documentation purposes. Electronically Signed: By: Van Clines M.D. On: 05/25/2017 11:20   Ct Image Guided Drainage By Percutaneous  Catheter  Result Date: 05/26/2017 INDICATION: History of colon cancer, post LAR with primary colorectal anastomosis and diverting loop ileostomy with umbilical hernia repair on 05/11/2017 by Dr. Georgette Dover found to have postoperative pneumoperitoneum and perihepatic fluid on abdominal CT performed 05/25/2017 for which request made for placement of a percutaneous drainage catheter for infection source control purposes. EXAM: CT IMAGE GUIDED DRAINAGE BY PERCUTANEOUS CATHETER COMPARISON:  CT abdomen and pelvis - 05/25/2017; 05/19/2013 MEDICATIONS: The patient is currently admitted to the hospital and receiving intravenous antibiotics. The antibiotics were administered within an appropriate time frame prior to the initiation of the procedure. ANESTHESIA/SEDATION: Moderate (conscious) sedation was employed during this procedure. A total of Versed 1 mg and Fentanyl 50 mcg was administered intravenously. Moderate Sedation Time: 14 minutes. The patient's level of consciousness and vital signs were monitored continuously by radiology nursing throughout the procedure under my direct supervision. CONTRAST:  None COMPLICATIONS: None immediate. PROCEDURE: Informed written consent was obtained from patient's family after a discussion of the risks, benefits and alternatives to treatment. The patient was placed supine on the CT gantry and a pre procedural CT was performed re-demonstrating the known abscess/fluid collection within the perihepatic space. The procedure was planned. A timeout was performed prior to the initiation of the procedure. The skin overlying the ventral aspect the right upper abdomen was prepped and draped in the usual sterile fashion. The overlying soft tissues were anesthetized with 1% lidocaine with epinephrine. Appropriate trajectory was planned with the use of a 22 gauge spinal needle. An 18 gauge trocar needle was advanced into the abscess/fluid collection and a short Amplatz super stiff wire was coiled  within the collection. Appropriate positioning was confirmed with a limited CT scan. The tract was serially dilated allowing placement of a 10 [place] all-purpose drainage catheter. Appropriate positioning was confirmed with a limited postprocedural CT scan. Approximately 10 ml of dark yellow fluid was aspirated. The tube was connected to a JP bulb and sutured in place. A dressing was placed. The patient tolerated the procedure well without immediate post procedural complication. IMPRESSION: Successful CT guided placement of a 10 French all purpose drain catheter into the perihepatic air in fluid collection with aspiration of 10 mL of dark yellow fluid. Samples were sent to the laboratory as requested by the ordering clinical team. Electronically Signed   By: Sandi Mariscal M.D.   On: 05/26/2017 13:21    Anti-infectives: Anti-infectives (From admission, onward)   Start     Dose/Rate Route Frequency Ordered Stop   05/20/17 2230  vancomycin (VANCOCIN) 1,250 mg in sodium chloride 0.9 % 250 mL IVPB     1,250 mg 166.7 mL/hr over 90 Minutes Intravenous Every 24 hours 05/20/17 2151     05/20/17 1400  piperacillin-tazobactam (ZOSYN) IVPB 3.375 g     3.375 g 12.5 mL/hr over 240 Minutes Intravenous Every 8 hours 05/20/17 1044     05/20/17 0500  anidulafungin (ERAXIS) 100 mg in sodium chloride 0.9 % 100 mL IVPB     100 mg 78 mL/hr over 100 Minutes Intravenous Every 24 hours 05/19/17 0116     05/19/17 2200  vancomycin (VANCOCIN) 1,500 mg in sodium  chloride 0.9 % 500 mL IVPB  Status:  Discontinued     1,500 mg 250 mL/hr over 120 Minutes Intravenous Every 24 hours 05/18/17 2126 05/20/17 2150   05/19/17 0130  anidulafungin (ERAXIS) 200 mg in sodium chloride 0.9 % 200 mL IVPB     200 mg 78 mL/hr over 200 Minutes Intravenous  Once 05/19/17 0116 05/19/17 0520   05/19/17 0130  Ampicillin-Sulbactam (UNASYN) 3 g in sodium chloride 0.9 % 100 mL IVPB  Status:  Discontinued     3 g 200 mL/hr over 30 Minutes Intravenous  Every 8 hours 05/19/17 0118 05/20/17 1044   05/18/17 2200  vancomycin (VANCOCIN) 1,750 mg in sodium chloride 0.9 % 500 mL IVPB     1,750 mg 250 mL/hr over 120 Minutes Intravenous  Once 05/18/17 2126 05/19/17 0400   05/17/17 0930  piperacillin-tazobactam (ZOSYN) IVPB 3.375 g  Status:  Discontinued     3.375 g 12.5 mL/hr over 240 Minutes Intravenous Every 8 hours 05/17/17 0836 05/19/17 0118   05/11/17 2200  cefoTEtan (CEFOTAN) 2 g in sodium chloride 0.9 % 100 mL IVPB     2 g 200 mL/hr over 30 Minutes Intravenous Every 12 hours 05/11/17 1839 05/11/17 2034   05/11/17 0600  cefoTEtan in Dextrose 5% (CEFOTAN) IVPB 2 g     2 g Intravenous On call to O.R. 05/10/17 1142 05/11/17 1416       Assessment/Plan Coronary artery disease. History of myocardial infarction Hypertension Status post thoracic aortic dissection and stent placement  status post bilateral iliac stent placements COPD AKI/CKD II- creatinine1.46 today. 745cc/24hrs NIDDM-hypoglycemia resolved. Chronic anemia- hgb8.6 - monitor Obesity Hypokalemia -resolved; continue to monitor Agitation -  Now restrained and on precedex   Reducible umbilical hernia Rectosigmoid colon mass- S/p LAR with primary colorectal (EE)anastomosis and diverting loop ileostomy with umbilical hernia repair 03/18/14 Dr. 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   -Low colorectal anastomotic leak.Diverted.JP drain pulled out inadvertently due to confusion.   -repeat CT scan shows no new fluid collection in pelvis near his anastomotic leak, but a new perihepatic collection.  This was drained by IR yesterday.  CX shows   FEW WBC PRESENT, PREDOMINANTLY PMN  FEW GRAM POSITIVE COCCI IN PAIRS  RARE GRAM NEGATIVE RODS  RARE GRAM POSITIVE RODS     -high ileostomy output resolved.  All anti-diarrheals have been  stopped.  Output yesterday was essentially none  -patient is thirsty.  Given he is still on precedex and intermittently sedated, will order a swallow eval prior to giving him any type of oral nutrition.  He was somewhat awake this morning and complaining of being thirsty.  If cleared, can try some liquids.  -oncology outpatient follow-up arranged  Spontaneous RTP hematoma with ABL anemia -RTP hematoma seen on CT scan 2 days ago. Transfused 2 units.   -hgb up to 9.1   Severe protein calorie malnutrition -TNA -prealbumin <5 and albumin less than 1.0   FEN:NPO/TNA/IVFs VTE: SCDs, lovenox RC:VELFY 5/7 -->5/9 Unasyn 5/9-->5/10Vanc 5/9-->eraxis 5/9-->  Zosyn 5/10-->    LOS: 21 days    [name] , [facility] 05/27/2017, 8:34 AM Pager: [phone]

## 2017-05-27 NOTE — Progress Notes (Addendum)
Pharmacy Antibiotic Note  Jesse Le is a 72 y.o. male admitted on 05/06/2017 with sepsis. Now being treated for intra-abdominal infection.  Pharmacy has been consulted for Vancomycin, Zosyn, Eraxis dosing.   Renal function has returned to baseline (0.9-1). WBC elevated (on steroids), afebrile.   Plan:  Vancomycin 1250mg  IV q12h (goal 15-20).  Zosyn 3.375g IV q 8 hrs Eraxis 100 mg q24h F/u ID recommendations, abscess culture, renal function, LOT, clinical progression  Height: 6' 0.99" (185.4 cm) Weight: 273 lb 5.9 oz (124 kg) IBW/kg (Calculated) : 79.88  Temp (24hrs), Avg:97.7 F (36.5 C), Min:97.5 F (36.4 C), Max:98.2 F (36.8 C)  Recent Labs  Lab 05/20/17 2044 05/21/17 0428  05/22/17 0300 05/23/17 0356 05/24/17 0339 05/24/17 2014 05/25/17 0400 05/25/17 1222 05/26/17 0426 05/27/17 0423  WBC  --  12.4*  --   --  11.0*  --   --   --  14.4* 17.0* 20.0*  CREATININE  --   --    < > 1.67* 1.46* 1.03  --  0.94  --  1.04 0.95  LATICACIDVEN  --   --   --  1.7  --   --   --   --   --   --   --   VANCOTROUGH 15  --   --   --   --   --  16  --   --   --   --    < > = values in this interval not displayed.    Estimated Creatinine Clearance: 96.9 mL/min (by C-G formula based on SCr of 0.95 mg/dL).    No Known Allergies  Antimicrobials this admission: Zosyn 5/7>>5/9; 5/10>> Unasyn 5/9>>5/10 Vancomycin 5/7>> Eraxis 5/8>>  Dose adjustments this admission: 5/14 VT =16 > cont 1250/24h  Microbiology results: 5/8 BCx: Neg 5/8 MRSA PCR: neg  Addendum: Vancomycin 1250mg  IV q24h (keeping same interval as previous due to therapeutic trough levels at similar renal function as three days ago).  Nida Boatman, PharmD PGY1 Acute Care Pharmacy Resident Pager: [phone] 05/27/2017 9:56 AM

## 2017-05-27 NOTE — Evaluation (Signed)
Occupational Therapy Re-Evaluation Patient Details Name: Jesse Le MRN: [id number] DOB: [birth year] Today's Date: 05/27/2017    History of Present Illness Pt is a 72 y.o. 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  He developed sepsis with septic shock from peritonitis. PMH includes HTN, DM, COPD, aortic aneurysm, CKD II, obesity.    Clinical Impression   Pt admitted with above. He demonstrates the below listed deficits and will benefit from continued OT to maximize safety and independence with BADLs.  Pt seen this date for re-evaluation.  Pt confused with periods of agitation.  He was able to follow one step commands ~70% of time, and was able to move sit to stand with min A +2 and 3rd person to manage lines, using the stedy. He requires total A for ADLs.   RN present throughout session.   Pt became agitated with RN, but easily redirected.  Goals and treatment plan updated.  VSS throughout session.        Follow Up Recommendations  SNF;Supervision/Assistance - 24 hour    Equipment Recommendations  None recommended by OT  .  Recommendations for Other Services       Precautions / Restrictions Precautions Precautions: Fall Precaution Comments: ileostomy bag, open abdominal wound; JP drain.   Required Braces or Orthoses: Other Brace/Splint Other Brace/Splint: abdominal binder       Mobility Bed Mobility Overal bed mobility: Needs Assistance Bed Mobility: Rolling;Sidelying to Sit Rolling: Mod assist;+2 for safety/equipment;+2 for physical assistance Sidelying to sit: Min assist;+2 for physical assistance;+2 for safety/equipment       General bed mobility comments: Pt initially resisted rolling, and required assist to initiate movement.  Status varied between min - mod A +2.  Pt required assist to initiate moving LEs off the bed and min A to lift trunk    Transfers Overall transfer level: Needs assistance Equipment used: Ambulation equipment used Transfers: Sit to/from [facility] Sit to Stand: Min assist;+2 physical assistance;+2 safety/equipment Stand pivot transfers: Min assist;+2 physical assistance;+2 safety/equipment       General transfer comment: asisst to move into standing and assist to steady.  RN present to manage lines.      Balance Overall balance assessment: Needs assistance Sitting-balance support: Feet supported Sitting balance-[name]: Fair Sitting balance - Comments: EOB sitting with close min guard assist    Standing balance support: Bilateral upper extremity supported Standing balance-[name]: Poor Standing balance comment: Reliant on UE support                            ADL either performed or assessed with clinical judgement   ADL Overall ADL's : Needs assistance/impaired Eating/Feeding: NPO   Grooming: Wash/dry hands;Wash/dry face;Oral care;Maximal assistance;Sitting   Upper Body Bathing: Total assistance;Sitting   Lower Body Bathing: Total assistance;Bed level;Sit to/from stand   Upper Body Dressing : Total assistance;Sitting   Lower Body Dressing: Total assistance;Sit to/from stand   Toilet Transfer: Minimal assistance;+2 for physical assistance;+2 for safety/equipment;Stand-pivot;BSC Toilet Transfer Details (indicate cue type and reason): using stedy  Toileting- Clothing Manipulation and Hygiene: Total assistance;Bed level;Sit to/from stand       Functional mobility during ADLs: Minimal assistance;+2 for physical assistance;+2 for safety/equipment General ADL Comments: Pt confused and unable to sustain attention to task for ADLs      Vision  Perception     Praxis      Pertinent Vitals/Pain Pain Assessment: Faces Faces Pain Scale: Hurts a little bit Pain Location: Abdomen Pain Descriptors / Indicators: Sore Pain Intervention(s):  Monitored during session     Hand Dominance Right   Extremity/Trunk Assessment Upper Extremity Assessment Upper Extremity Assessment: Generalized weakness RUE Coordination: decreased fine motor;decreased gross motor   Lower Extremity Assessment Lower Extremity Assessment: Defer to PT evaluation   Cervical / Trunk Assessment Cervical / Trunk Assessment: Other exceptions(open abdomen )   Communication Communication Communication: Expressive difficulties(mumbled speech )   Cognition Arousal/Alertness: Awake/alert;Lethargic Behavior During Therapy: Flat affect Overall Cognitive Status: Impaired/Different from baseline Area of Impairment: Orientation;Attention;Following commands                 Orientation Level: Disoriented to;Place;Time;Situation Current Attention Level: Focused   Following Commands: Follows one step commands consistently;Follows one step commands with increased time Safety/Judgement: Decreased awareness of safety;Decreased awareness of deficits   Problem Solving: Slow processing;Difficulty sequencing;Requires verbal cues;Requires tactile cues General Comments: Pt currently on Precedex.  Confused.  He has been agitated, and had mittens and wrist restraints in place    General Comments  VSS throughout session     Exercises     Shoulder Instructions      Home Living Family/patient expects to be discharged to:: Private residence Living Arrangements: Spouse/significant other Available Help at Discharge: Family;Available PRN/intermittently Type of Home: Apartment Home Access: Level entry     Home Layout: One level     Bathroom Shower/Tub: Teacher, early years/pre: Standard Bathroom Accessibility: Yes How Accessible: Accessible via walker Home Equipment: Cane - single point   Additional Comments: info gleaned from chart       Prior Functioning/Environment Level of Independence: Needs assistance  Gait / Transfers Assistance Needed:  inidependent without AD ADL's / Homemaking Assistance Needed: independent            OT Problem List: Decreased strength;Decreased activity tolerance;Impaired balance (sitting and/or standing);Decreased cognition;Decreased safety awareness;Decreased knowledge of use of DME or AE;Decreased knowledge of precautions;Cardiopulmonary status limiting activity;Obesity;Pain      OT Treatment/Interventions: Self-care/ADL training;Therapeutic exercise;Energy conservation;DME and/or AE instruction;Therapeutic activities;Cognitive remediation/compensation;Patient/family education;Balance training    OT Goals(Current goals can be found in the care plan section) Acute Rehab OT Goals OT Goal Formulation: Patient unable to participate in goal setting Time For Goal Achievement: 06/10/17 Potential to Achieve Goals: Good ADL Goals Pt Will Perform Grooming: with min assist;standing Pt Will Perform Upper Body Bathing: with mod assist;sitting Pt Will Perform Lower Body Bathing: with mod assist;sit to/from stand;with adaptive equipment Pt Will Perform Upper Body Dressing: with mod assist;sitting Pt Will Perform Lower Body Dressing: with mod assist;sit to/from stand;with adaptive equipment Pt Will Transfer to Toilet: with min assist;ambulating;regular height toilet;bedside commode  OT Frequency: Min 2X/week   Barriers to D/C: Decreased caregiver support          Co-evaluation PT/OT/SLP Co-Evaluation/Treatment: Yes Reason for Co-Treatment: Complexity of the patient's impairments (multi-system involvement);For patient/therapist safety;Necessary to address cognition/behavior during functional activity;To address functional/ADL transfers   OT goals addressed during session: ADL's and self-care      AM-PAC PT "6 Clicks" Daily Activity     Outcome Measure Help from another person eating meals?: Total Help from another person taking care of personal grooming?: Total Help from another person toileting,  which includes using toliet, bedpan, or urinal?: A Lot Help from another person bathing (including washing, rinsing, drying)?: Total Help from another  person to put on and taking off regular upper body clothing?: Total Help from another person to put on and taking off regular lower body clothing?: Total 6 Click Score: 7   End of Session Equipment Utilized During Treatment: Other (comment)(Stedy and abdominal binder ) Nurse Communication: Mobility status;Need for lift equipment  Activity Tolerance: Patient tolerated treatment well Patient left: in chair;with call bell/phone within reach;with chair alarm set;with nursing/sitter in room  OT Visit Diagnosis: Other abnormalities of gait and mobility (R26.89);Muscle weakness (generalized) (M62.81);Pain Pain - part of body: (abdomen )                Time: 0511-0211 OT Time Calculation (min): 53 min Charges:  OT General Charges $OT Visit: 1 Visit OT Evaluation $OT Re-eval: 1 Re-eval OT Treatments $Therapeutic Activity: 8-22 mins G-Codes:     [facility], OTR/L [phone]   Lucille Passy M 05/27/2017, 1:41 PM

## 2017-05-28 LAB — BASIC METABOLIC PANEL
Anion gap: 7 (ref 5–15)
BUN: 26 mg/dL — ABNORMAL HIGH (ref 6–20)
CO2: 24 mmol/L (ref 22–32)
Calcium: 7.5 mg/dL — ABNORMAL LOW (ref 8.9–10.3)
Chloride: 111 mmol/L (ref 101–111)
Creatinine, Ser: 1.05 mg/dL (ref 0.61–1.24)
GFR calc Af Amer: >60 mL/min (ref >60)
GFR calc non Af Amer: >60 mL/min (ref >60)
Glucose, Bld: 190 mg/dL — ABNORMAL HIGH (ref 65–99)
Potassium: 3.6 mmol/L (ref 3.5–5.1)
Sodium: 142 mmol/L (ref 135–145)

## 2017-05-28 LAB — GLUCOSE, CAPILLARY
GLUCOSE-CAPILLARY: 104 mg/dL — AB (ref 65–99)
GLUCOSE-CAPILLARY: 144 mg/dL — AB (ref 65–99)
GLUCOSE-CAPILLARY: 163 mg/dL — AB (ref 65–99)
GLUCOSE-CAPILLARY: 174 mg/dL — AB (ref 65–99)
Glucose-Capillary: 162 mg/dL — ABNORMAL HIGH (ref 65–99)
Glucose-Capillary: 131 mg/dL — ABNORMAL HIGH (ref 65–99)

## 2017-05-28 LAB — CBC
HCT: 26.9 % — ABNORMAL LOW (ref 39.0–52.0)
Hemoglobin: 8.6 g/dL — ABNORMAL LOW (ref 13.0–17.0)
MCH: 25.8 pg — ABNORMAL LOW (ref 26.0–34.0)
MCHC: 32 g/dL (ref 30.0–36.0)
MCV: 80.8 fL (ref 78.0–100.0)
Platelets: 314 10*3/uL (ref 150–400)
RBC: 3.33 MIL/uL — ABNORMAL LOW (ref 4.22–5.81)
RDW: 26.1 % — ABNORMAL HIGH (ref 11.5–15.5)
WBC: 21.8 10*3/uL — ABNORMAL HIGH (ref 4.0–10.5)

## 2017-05-28 LAB — MAGNESIUM: Magnesium: 2 mg/dL (ref 1.7–2.4)

## 2017-05-28 MED ORDER — INSULIN ASPART 100 UNIT/ML ~~LOC~~ SOLN
0.0000 [IU] | SUBCUTANEOUS | Status: DC
  Administered 2017-05-28: 4 [IU] via SUBCUTANEOUS
  Administered 2017-05-28: 3 [IU] via SUBCUTANEOUS

## 2017-05-28 MED ORDER — ZINC TRACE METAL 1 MG/ML IV SOLN
INTRAVENOUS | Status: AC
  Administered 2017-05-28: 18:00:00 via INTRAVENOUS
  Filled 2017-05-28: qty 772.8

## 2017-05-28 MED ORDER — LABETALOL HCL 5 MG/ML IV SOLN
20.0000 mg | Freq: Four times a day (QID) | INTRAVENOUS | Status: DC
  Administered 2017-05-28 – 2017-05-30 (×7): 20 mg via INTRAVENOUS
  Filled 2017-05-28 (×7): qty 4

## 2017-05-28 MED ORDER — POTASSIUM CHLORIDE 10 MEQ/50ML IV SOLN
10.0000 meq | INTRAVENOUS | Status: AC
  Administered 2017-05-28 (×6): 10 meq via INTRAVENOUS
  Filled 2017-05-28 (×6): qty 50

## 2017-05-28 NOTE — Progress Notes (Signed)
17 Days Post-Op   Subjective/Chief Complaint: Tolerated clears per RN   Objective: Vital signs in last 24 hours: Temp:  [97.2 F (36.2 C)-97.9 F (36.6 C)] 97.2 F (36.2 C) (05/18 0734) Pulse Rate:  [44-67] 46 (05/18 0600) Resp:  [11-27] 11 (05/18 0600) BP: (111-132)/(44-117) 117/63 (05/18 0734) SpO2:  [95 %-100 %] 99 % (05/18 0600) Arterial Line BP: (138-164)/(41-63) 153/49 (05/18 0600) Weight:  [125.3 kg (276 lb 3.8 oz)] 125.3 kg (276 lb 3.8 oz) (05/17 1728) Last BM Date: 05/25/17  Intake/Output from previous day: 05/17 0701 - 05/18 0700 In: 3782.1 [P.O.:240; I.V.:3492.1; IV Piggyback:50] Out: 2695 [Urine:2625; Drains:45; Stool:25] Intake/Output this shift: No intake/output data recorded.  General appearance: cooperative GI: ostomy with stool, midline with some fascial separation and some slough, granulating  Lab Results:  Recent Labs    05/27/17 0423 05/28/17 0326  WBC 20.0* 21.8*  HGB 9.1* 8.6*  HCT 28.3* 26.9*  PLT 320 314   BMET Recent Labs    05/27/17 0423 05/28/17 0326  NA 140 142  K 3.5 3.6  CL 111 111  CO2 23 24  GLUCOSE 182* 190*  BUN 24* 26*  CREATININE 0.95 1.05  CALCIUM 7.2* 7.5*   PT/INR Recent Labs    05/25/17 1638  LABPROT 15.9*  INR 1.28   ABG No results for input(s): PHART, HCO3 in the last 72 hours.  Invalid input(s): PCO2, PO2  Studies/Results: Ct Image Guided Drainage By Percutaneous Catheter  Result Date: 05/26/2017 INDICATION: History of colon cancer, post LAR with primary colorectal anastomosis and diverting loop ileostomy with umbilical hernia repair on 05/11/2017 by Dr. Georgette Dover found to have postoperative pneumoperitoneum and perihepatic fluid on abdominal CT performed 05/25/2017 for which request made for placement of a percutaneous drainage catheter for infection source control purposes. EXAM: CT IMAGE GUIDED DRAINAGE BY PERCUTANEOUS CATHETER COMPARISON:  CT abdomen and pelvis - 05/25/2017; 05/19/2013 MEDICATIONS: The  patient is currently admitted to the hospital and receiving intravenous antibiotics. The antibiotics were administered within an appropriate time frame prior to the initiation of the procedure. ANESTHESIA/SEDATION: Moderate (conscious) sedation was employed during this procedure. A total of Versed 1 mg and Fentanyl 50 mcg was administered intravenously. Moderate Sedation Time: 14 minutes. The patient's level of consciousness and vital signs were monitored continuously by radiology nursing throughout the procedure under my direct supervision. CONTRAST:  None COMPLICATIONS: None immediate. PROCEDURE: Informed written consent was obtained from patient's family after a discussion of the risks, benefits and alternatives to treatment. The patient was placed supine on the CT gantry and a pre procedural CT was performed re-demonstrating the known abscess/fluid collection within the perihepatic space. The procedure was planned. A timeout was performed prior to the initiation of the procedure. The skin overlying the ventral aspect the right upper abdomen was prepped and draped in the usual sterile fashion. The overlying soft tissues were anesthetized with 1% lidocaine with epinephrine. Appropriate trajectory was planned with the use of a 22 gauge spinal needle. An 18 gauge trocar needle was advanced into the abscess/fluid collection and a short Amplatz super stiff wire was coiled within the collection. Appropriate positioning was confirmed with a limited CT scan. The tract was serially dilated allowing placement of a 10 [place] all-purpose drainage catheter. Appropriate positioning was confirmed with a limited postprocedural CT scan. Approximately 10 ml of dark yellow fluid was aspirated. The tube was connected to a JP bulb and sutured in place. A dressing was placed. The patient tolerated the procedure well without  immediate post procedural complication. IMPRESSION: Successful CT guided placement of a 10 [place] all purpose  drain catheter into the perihepatic air in fluid collection with aspiration of 10 mL of dark yellow fluid. Samples were sent to the laboratory as requested by the ordering clinical team. Electronically Signed   By: Sandi Mariscal M.D.   On: 05/26/2017 13:21    Anti-infectives: Anti-infectives (From admission, onward)   Start     Dose/Rate Route Frequency Ordered Stop   05/28/17 1200  vancomycin (VANCOCIN) 1,250 mg in sodium chloride 0.9 % 250 mL IVPB     1,250 mg 166.7 mL/hr over 90 Minutes Intravenous Every 24 hours 05/27/17 1324     05/27/17 1100  vancomycin (VANCOCIN) 1,250 mg in sodium chloride 0.9 % 250 mL IVPB  Status:  Discontinued     1,250 mg 166.7 mL/hr over 90 Minutes Intravenous Every 12 hours 05/27/17 1011 05/27/17 1324   05/20/17 2230  vancomycin (VANCOCIN) 1,250 mg in sodium chloride 0.9 % 250 mL IVPB  Status:  Discontinued     1,250 mg 166.7 mL/hr over 90 Minutes Intravenous Every 24 hours 05/20/17 2151 05/27/17 1011   05/20/17 1400  piperacillin-tazobactam (ZOSYN) IVPB 3.375 g     3.375 g 12.5 mL/hr over 240 Minutes Intravenous Every 8 hours 05/20/17 1044     05/20/17 0500  anidulafungin (ERAXIS) 100 mg in sodium chloride 0.9 % 100 mL IVPB     100 mg 78 mL/hr over 100 Minutes Intravenous Every 24 hours 05/19/17 0116     05/19/17 2200  vancomycin (VANCOCIN) 1,500 mg in sodium chloride 0.9 % 500 mL IVPB  Status:  Discontinued     1,500 mg 250 mL/hr over 120 Minutes Intravenous Every 24 hours 05/18/17 2126 05/20/17 2150   05/19/17 0130  anidulafungin (ERAXIS) 200 mg in sodium chloride 0.9 % 200 mL IVPB     200 mg 78 mL/hr over 200 Minutes Intravenous  Once 05/19/17 0116 05/19/17 0520   05/19/17 0130  Ampicillin-Sulbactam (UNASYN) 3 g in sodium chloride 0.9 % 100 mL IVPB  Status:  Discontinued     3 g 200 mL/hr over 30 Minutes Intravenous Every 8 hours 05/19/17 0118 05/20/17 1044   05/18/17 2200  vancomycin (VANCOCIN) 1,750 mg in sodium chloride 0.9 % 500 mL IVPB     1,750  mg 250 mL/hr over 120 Minutes Intravenous  Once 05/18/17 2126 05/19/17 0400   05/17/17 0930  piperacillin-tazobactam (ZOSYN) IVPB 3.375 g  Status:  Discontinued     3.375 g 12.5 mL/hr over 240 Minutes Intravenous Every 8 hours 05/17/17 0836 05/19/17 0118   05/11/17 2200  cefoTEtan (CEFOTAN) 2 g in sodium chloride 0.9 % 100 mL IVPB     2 g 200 mL/hr over 30 Minutes Intravenous Every 12 hours 05/11/17 1839 05/11/17 2034   05/11/17 0600  cefoTEtan in Dextrose 5% (CEFOTAN) IVPB 2 g     2 g Intravenous On call to O.R. 05/10/17 1142 05/11/17 1416      Assessment/Plan: s/p Procedure(s): Colon Resection Sigmoid Low Anterior Resection with Staple Colon Relast (N/A) HERNIA REPAIR UMBILICAL ADULT (N/A) Creation Vertical Loop ILEOSTOMY (N/A) Coronary artery disease. History of myocardial infarction Hypertension Status post thoracic aortic dissection and stent placement  status post bilateral iliac stent placements COPD AKI/CKD II- creatinine1.46 today. 745cc/24hrs NIDDM-hypoglycemia resolved. Chronic anemia- hgb8.6 - monitor Obesity Hypokalemia -resolved; continue to monitor Agitation -  Now restrained and on precedex   Reducible umbilical hernia Rectosigmoid colon mass- S/p LAR with  primary colorectal (EE)anastomosis and diverting loop ileostomy with umbilical hernia repair 0/0/71 Dr. Georgette Dover -Pathology shows adenoCA, 0/15 LNs -wet to dry to wound  -Low colorectal anastomotic leak.Diverted.JP drain pulled out inadvertently due to confusion.   -repeat CT scan shows no new fluid collection in pelvis near his anastomotic leak, but a new perihepatic collection.  This was drained by IR  -clears -oncology outpatient follow-up arranged  Spontaneous RTP hematoma with ABL anemia  Severe protein calorie malnutrition -TNA -prealbumin <5 and albumin less than 1.0   FEN:NPO/TNA/IVFs VTE: SCDs, lovenox QR:FXJOI 5/7 -->5/9 Unasyn 5/9-->5/10Vanc 5/9-->eraxis 5/9-->   Zosyn 5/10-->    LOS: 22 days    Zenovia Jarred 05/28/2017

## 2017-05-28 NOTE — Progress Notes (Signed)
Referring Physician(s): Blackman,D  Supervising Physician: Arne Cleveland  Patient Status:  Jesse Le  Chief Complaint: Perihepatic fluid collection   Subjective: Patient still with some confusion, asking why he is in restraints   Allergies: Patient has no known allergies.  Medications: Prior to Admission medications   Medication Sig Start Date End Date Taking? Authorizing Provider  aspirin EC 81 MG tablet Take 81 mg by mouth daily.   Yes [provider]  metFORMIN (GLUCOPHAGE) 500 MG tablet Take 1 tablet (500 mg total) by mouth 2 (two) times daily with a meal. 10/13/12  Yes Charolette Forward, MD  metoprolol tartrate (LOPRESSOR) 50 MG tablet Take 50 mg by mouth 2 (two) times daily.   Yes [provider]  glimepiride (AMARYL) 2 MG tablet Take 1 tablet (2 mg total) by mouth daily with breakfast. Patient not taking: Reported on 05/06/2017 10/13/12   Charolette Forward, MD     Vital Signs: BP (!) 129/53   Pulse (!) 51   Temp 98.7 F (37.1 C) (Axillary)   Resp 18   Ht 6' 0.99" (1.854 m)   Wt 276 lb 3.8 oz (125.3 kg)   SpO2 99%   BMI 36.45 kg/m   Physical Exam right upper quadrant drain intact, dressing mildly saturated, output 45 cc yellow fluid  Imaging: Ct Abdomen Pelvis W Contrast  Addendum Date: 05/25/2017   ADDENDUM REPORT: 05/25/2017 11:47 ADDENDUM: The original report was by Dr. Van Clines. The following addendum is by Dr. Van Clines: Critical Value/emergent results were called by telephone at the time of interpretation on 05/25/2017 at 11:44 am to Dr. Gretta Arab, who verbally acknowledged these results. Electronically Signed   By: Van Clines M.D.   On: 05/25/2017 11:47   Result Date: 05/25/2017 CLINICAL DATA:  Postoperative infection. Abdominal tube was retracted forcefully. EXAM: CT ABDOMEN AND PELVIS WITH CONTRAST TECHNIQUE: Multidetector CT imaging of the abdomen and pelvis was performed using the standard protocol following  bolus administration of intravenous contrast. CONTRAST:  174mL OMNIPAQUE IOHEXOL 300 MG/ML  SOLN COMPARISON:  05/19/2017 FINDINGS: Lower chest: Small right and trace left pleural effusions with passive atelectasis. There is some additional mild atelectasis in the right middle lobe. Mild cardiomegaly. Hepatobiliary: Motion artifact and streak artifact from arm positioning reduced sensitivity and specificity assessing the liver. At least 2 gallstones are present, the larger measuring 1.5 cm in diameter. No obvious hepatic abnormality observed observed. Pancreas: Unremarkable Spleen: Unremarkable Adrenals/Urinary Tract: Adrenal glands normal. Severely atrophic left kidney. Bilateral renal hypodense lesions are likely cysts, although 1 of these exophytic lesions on image 45/3 measuring 1.7 by 1.1 cm is mildly hyperdense and accordingly potentially a complex cyst or mass. This has roughly similar density characteristics to the precontrast exam of 05/19/2017 performed on the same CT scanner. A Foley catheter is present in the urinary bladder. Stomach/Bowel: Colorectal anastomosis is again observed. Currently there is only a trace amount of contained extraluminal gas tracking in the adjacent fascia plane, compared to the larger amount on 05/19/2017. However, there continues to be a considerable amount of free air in the belly, such that residual perforation is not excluded. Descending and sigmoid colon diverticulosis. 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 The stent extends down into the common iliac arteries, excluding a small abdominal aortic aneurysm. There is tenting of the celiac trunk and SMA which appear patent, and stenting of the patent right renal artery. 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 Reproductive: The prostate gland has a slightly lobular contour. Other: Acute right retroperitoneal hemorrhage primarily along the lateral and anterior margin of the right psoas muscle and between the right psoas and iliacus muscles, new compared to 05/19/17. This has multiple components with volume of he hematoma estimated at 280 cubic cm. I do not see definite active extravasation of contrast medium. Laparotomy wound healing by secondary intention noted. Right-sided ostomy site noted. There is a considerable new amount of ascites especially in the perihepatic region, containing locules of free intraperitoneal gas. There is also some new collected ascites loculated anteriorly in the left upper quadrant, and scattered pelvic ascites. Presacral edema similar to prior. Worsened subcutaneous edema especially along the flanks and upper thighs, and greater on the left than the right. Musculoskeletal: Thoracic and lumbar spondylosis with lumbar degenerative disc disease, and notable foraminal impingement bilaterally at L3-4, L4-5, and L5-S1. IMPRESSION: 1. Acute right retroperitoneal hemorrhage along the margin of the psoas and iliacus muscles is new compared to the 05/19/2017 exam, volume of hematoma approximately 280 cubic cm. No directly visualized active extravasation on today's exam. 2. Worsening ascites especially in the perihepatic region, with free intraperitoneal gas and loculated gas within the ascites. However, the amount of mesenteric gas immediately adjacent to the ileo colic anastomosis has focally decreased compared to previous. 3. Small right and trace left pleural effusions with passive atelectasis. 4. Stable appearance of stent graft traversing thoracic and abdominal aortic aneurysms and extending into the iliac vessels. 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 5. Other imaging findings of potential  clinical significance: Mild cardiomegaly. Cholelithiasis. Aortic Atherosclerosis (ICD10-I70.0). Aortic aneurysm NOS (ICD10-I71.9). Severely atrophic left kidney. Stable renal cystic lesions including a complex lesion of the right mid kidney which is most likely a complex cyst rather than a mass. Lobular prostate gland. Laparotomy wound healing by secondary intention. Right-sided ostomy. Worsened subcutaneous edema along the flanks and upper thighs. Multilevel lower lumbar foraminal impingement. Radiology assistant personnel have been notified to put me in telephone contact with the referring physician or the referring physician's clinical representative in order to discuss these findings. Once this communication is established I will issue an addendum to this report for documentation purposes. Electronically Signed: By: Van Clines M.D. On: 05/25/2017 11:20   Ct Image Guided Drainage By Percutaneous Catheter  Result Date: 05/26/2017 INDICATION: History of colon cancer, post LAR with primary colorectal anastomosis and diverting loop ileostomy with umbilical hernia repair on 05/11/2017 by Dr. Georgette Dover found to have postoperative pneumoperitoneum and perihepatic fluid on abdominal CT performed 05/25/2017 for which request made for placement of a percutaneous drainage catheter for infection source control purposes. EXAM: CT IMAGE GUIDED DRAINAGE BY PERCUTANEOUS CATHETER COMPARISON:  CT abdomen and pelvis - 05/25/2017; 05/19/2013 MEDICATIONS: The patient is currently admitted to the hospital and receiving intravenous antibiotics. The antibiotics were administered within an appropriate time frame prior to the initiation of the procedure. ANESTHESIA/SEDATION: Moderate (conscious) sedation was employed during this procedure. A total of Versed 1 mg and Fentanyl 50 mcg was administered intravenously. Moderate Sedation Time: 14 minutes. The patient's level of consciousness and vital signs were monitored continuously by  radiology nursing throughout the procedure under my direct supervision. CONTRAST:  None COMPLICATIONS: None immediate. PROCEDURE:  Informed written consent was obtained from patient's family after a discussion of the risks, benefits and alternatives to treatment. The patient was placed supine on the CT gantry and a pre procedural CT was performed re-demonstrating the known abscess/fluid collection within the perihepatic space. The procedure was planned. A timeout was performed prior to the initiation of the procedure. The skin overlying the ventral aspect the right upper abdomen was prepped and draped in the usual sterile fashion. The overlying soft tissues were anesthetized with 1% lidocaine with epinephrine. Appropriate trajectory was planned with the use of a 22 gauge spinal needle. An 18 gauge trocar needle was advanced into the abscess/fluid collection and a short Amplatz super stiff wire was coiled within the collection. Appropriate positioning was confirmed with a limited CT scan. The tract was serially dilated allowing placement of a 10 [place] all-purpose drainage catheter. Appropriate positioning was confirmed with a limited postprocedural CT scan. Approximately 10 ml of dark yellow fluid was aspirated. The tube was connected to a JP bulb and sutured in place. A dressing was placed. The patient tolerated the procedure well without immediate post procedural complication. IMPRESSION: Successful CT guided placement of a 10 French all purpose drain catheter into the perihepatic air in fluid collection with aspiration of 10 mL of dark yellow fluid. Samples were sent to the laboratory as requested by the ordering clinical team. Electronically Signed   By: Sandi Mariscal M.D.   On: 05/26/2017 13:21    Labs:  CBC: Recent Labs    05/25/17 1222 05/26/17 0426 05/27/17 0423 05/28/17 0326  WBC 14.4* 17.0* 20.0* 21.8*  HGB 6.7* 8.7* 9.1* 8.6*  HCT 22.4* 26.6* 28.3* 26.9*  PLT 232 268 320 314     COAGS: Recent Labs    05/19/17 0050 05/25/17 1638  INR 1.60 1.28    BMP: Recent Labs    05/25/17 0400 05/26/17 0426 05/27/17 0423 05/28/17 0326  NA 138 139 140 142  K 4.2 3.4* 3.5 3.6  CL 110 107 111 111  CO2 21* 22 23 24   GLUCOSE 154* 185* 182* 190*  BUN 24* 27* 24* 26*  CALCIUM 7.5* 7.2* 7.2* 7.5*  CREATININE 0.94 1.04 0.95 1.05  GFRNONAA >60 >60 >60 >60  GFRAA >60 >60 >60 >60    LIVER FUNCTION TESTS: Recent Labs    05/07/17 0332 05/20/17 0348 05/23/17 0356 05/26/17 0426  BILITOT 1.2 1.1 0.6 0.8  AST 14* 85* 34 51*  ALT 7* 50 20 38  ALKPHOS 44 76 56 110  PROT 6.7 4.8* 4.2* 4.7*  ALBUMIN 2.2* 1.2* <1.0* 1.2*    Assessment and Plan: Patient with history of colon cancer, post LAR with primary colorectal anastomosis and diverting loop ileostomy with umbilical hernia repair on 5/1.  Postop noted to have pneumoperitoneum/ perihepatic fluid.  Status post perihepatic drain placement on 5/16.  Afebrile, creatinine normal, drain fluid cultures with Pseudomonas, WBC 21.8 up slightly from 20, hemoglobin 8.6.  Continue current treatment.  Obtain follow-up CT when drain output minimal.  Electronically Signed: D. Rowe Robert, PA-C 05/28/2017, 1:05 PM   I spent a total of 15 minutes at the the patient's bedside AND on the patient's hospital floor or unit, greater than 50% of which was counseling/coordinating care for perihepatic fluid collection drain    Patient ID: Jesse Le, male   DOB: [birth year], 72 y.o.   MRN: [id number]

## 2017-05-28 NOTE — Progress Notes (Signed)
PULMONARY / CRITICAL CARE MEDICINE   Name: Diamond Martucci MRN: [id number] DOB: February 22, 1945    ADMISSION DATE:  05/06/2017 CONSULTATION DATE:  05/18/2017  REFERRING MD:  Dr. Terrence Dupont  CHIEF COMPLAINT:  Hypotension  HISTORY OF PRESENT ILLNESS:   72 yo male presented with abdominal pain, weight loss and intermittent hematochezia.  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  Had low anterior resection with primary anastomosis with diverting loop ileostomy.  Developed hypotension, hypoglycemia 5/07.  Developed hypotension and altered mental status 5/08 and transferred to ICU. Pt has an anastomotic leak, drain placed by surgery PMHx of CAD, DM, TAA.  5/13 The patient is awake and tracking but not really verbal. BP is running a bit high. 170-180. Patient remains ion a small dose of precedex at 0.28mcg/kg. respiratotory status appears comfortable. Does not appear to be onpressors at this time.  5/14 Little overall change in status The patient rermains lethargic and non-communicative. BP has been high requiring nicardipine drip in addition to prn labetalol and hydralazine Discussed the situation with surgery. The patient had pulled out his RLQ drain. A new CT scan ihas been ordered and he may need drain replaced.  5/15 The patient is awake and up in the chair. He went down for a CT scan today. There was less extraluminal gas in the area of the anastomosis. There is increasing ascities. A new spontaneous right retroperitoneal hematoma was noted as well. The patient remains hemodynamically stable. He is more awake and speaking to his girfriend.  5/17 The patient is awake. He appears to be a little tearful. Whe he speaks it is vvery difficult for me to understand him. He apparently  Was a bit delirious overnight. He is still requiring Precedex in varying dose. We don't have the ability yest to substitute an oral agent .  5/18 The patient is awake and talkative. He is  being allowed to take clear liquids. His BP remains elevated He has a heart rate of about 50 likely related to precedex. The patient is on a carden e drip as well to keep his BP < 601 systolic. The patient remains onTPN.  VITAL SIGNS: BP (!) 123/53   Pulse (!) 43   Temp (!) 97.2 F (36.2 C) (Axillary)   Resp 16   Ht 6' 0.99" (1.854 m)   Wt 276 lb 3.8 oz (125.3 kg)   SpO2 100%   BMI 36.45 kg/m   INTAKE / OUTPUT: I/O last 3 completed shifts: In: 6256.3 [P.O.:240; I.V.:5456.3; IV Piggyback:560] Out: 0932 [Urine:3405; Drains:90; Stool:25]  PHYSICAL EXAMINATION:  General -obese man, no distress Eyes -equal, reactive ENT -port intention, oropharynx clear Cardiac -regular, no murmur, mild pretibial edema Chest -few scattered bilateral inspiratory crackles Abd -ileostomy   JP drain wa s removed by the patient two days ago. Abdominal binder.Abdomen appears more distended Ext - 2+ lower extremity edema Skin -no rash Neuro -awake, somewhat sleepy, Not really seaking at this time current agitation   LABS:  BMET Recent Labs  Lab 05/26/17 0426 05/27/17 0423 05/28/17 0326  NA 139 140 142  K 3.4* 3.5 3.6  CL 107 111 111  CO2 22 23 24   BUN 27* 24* 26*  CREATININE 1.04 0.95 1.05  GLUCOSE 185* 182* 190*    Electrolytes Recent Labs  Lab 05/23/17 0356 05/24/17 0339  05/26/17 0426 05/27/17 0423 05/28/17 0326  CALCIUM 7.3* 7.4*   < > 7.2* 7.2* 7.5*  MG 2.2 2.1  --  1.6*  1.8 2.0  PHOS 4.6 3.3  --  3.2  --   --    < > = values in this interval not displayed.    CBC Recent Labs  Lab 05/26/17 0426 05/27/17 0423 05/28/17 0326  WBC 17.0* 20.0* 21.8*  HGB 8.7* 9.1* 8.6*  HCT 26.6* 28.3* 26.9*  PLT 268 320 314    Coag's Recent Labs  Lab 05/25/17 1638  INR 1.28    Sepsis Markers Recent Labs  Lab 05/22/17 0300  LATICACIDVEN 1.7    ABG Recent Labs  Lab 05/22/17 0254  PHART 7.405  PCO2ART 27.2*  PO2ART 73.0*    Liver Enzymes Recent Labs  Lab  05/23/17 0356 05/26/17 0426  AST 34 51*  ALT 20 38  ALKPHOS 56 110  BILITOT 0.6 0.8  ALBUMIN <1.0* 1.2*    Cardiac Enzymes No results for input(s): TROPONINI, PROBNP in the last 168 hours.  Glucose Recent Labs  Lab 05/27/17 1200 05/27/17 1636 05/27/17 2024 05/27/17 2353 05/28/17 0359 05/28/17 0739  GLUCAP 225* 183* 159* 144* 163* 162*    Imaging No results found.   STUDIES:  CT abd 4/26 >> 9 cm mass CT abd 5/09 >> discontinuity in the posterior wall just above the anastomosis with feculent appearing material and gas in the pelvis, measuring up to 5x 3 cm anterior and superior to the sigmoid colon.  Fluid collection right lower quadrant measuring up to 6.4 x 3.5 cm. Moderate pneumoperitoneum.  CULTURES: Blood culture 5/8 >  ANTIBIOTICS: Zosyn 5/7 > 5/8 Zosyn 5/10>> Unasyn 5/8 > 5/10 Vancomycin 5/7 > Eraxis 5/8 >  SIGNIFICANT EVENTS: 4/26 admit for abdominal pain. Mass identified on CT 4/28 colonoscopy. Biopsy sent.  5/1 bx grows adeno. To OR for resection ileostomy 5/8 hypotension hypoglycemia AMS, transfer to ICU 5/16: Large volume paracentesis  LINES/TUBES: Lt IJ CVL  5/8 > JP Drain 5/1>  DISCUSSION: 72 yo male with sepsis from peritonitis after surgery for colon cancer and CT abd +  for anastomotic leak.>> Drain placed 5/1  ASSESSMENT / PLAN:  Sepsis with septic shock from peritonitis.  Restarted on norepinephrine, overall volume resuscitation is been 6-1/2 L over the last 3 days Colon cancer s/p low anterior resection with ileostomy. Continue current Zosyn, vancomycin, Eraxis JP drain in place appears to be draining purulent fluid IV fluid resuscitation, attempt to wean norepinephrine as able Restart stress dose steroids (had been weaning). It appears that the patient is doing better hemodynamically at this time.   Discussed the plan with surgery re new CT scan and possible new drain placement. The patient is hemodynamically stable. He has been  off pressors for 2 days. Surgery spoke to me and they plan on asking IR to do a poaracetesis foir diagnostic and therapeutic purposes. 5/17 Patient s/p diagnostic paracentesis There were few GPCs  And rare GNR on the gram stain 5/18 The patient hasd been ON Vanco, Zosyn and Eraxis for about 1 week now. As noted this gram stain from the abdomen showed some residual organisms.  Systolic HTN BP has been hard to control. The patient is on a cardene drip at 10mg /hr. In addition there are prn orders for hydralazine and labetalol. Bp is better presently. 5/17 The patient requires Cardene for bp control and is on 12mg /hr. 5/18 I have changed labetalol to q 6h 20 mg. If his heart rate slows down further will need to back off precedex. unfortunatellt cannot take po meds yet.     Acute renal failure from  ATN.   Oliguria improved slightly over last 24 hours  Renal fn. Better. Creatinine is now down to 0.95. The patient had 2478 out in the poast 24 hours.    DM type II. Hypoglycemia in setting of sepsis, continues to be a problem. Scheduled CBG Restart stress dose steroids, question some degree of adrenal insufficiency contributing to his hypoglycemia Replacing dextrose aggressively in his TPN, discussed with pharmacy on 5/12. Blood sugars are vacillatinmg quite a bit lately. May be up lately due to stress dose steroids. Blood sugar remain a bit elevated. I think we can stop exogenous steroids at this time  That might help with that. Pharmacy may need to increase insulin in the TPN as blood sugarsa are still running a little high.  Acute metabolic encephalopathy 2nd to sepsis. Improving Follow mental status off of Precedex Minimize sedating medications Continue to reorient 5/18 I think his mental status is much improved. He is intermittently a little confused  Relative adrenal insufficiency. Hydrocortisone back to 50 mg every 6 hours. I have decreased hydrocort to q 12h. I am going to  stop steroiids at this time  CXR persisistent small opacity at the left base. No mjaor cough 02 sats on nasal cannula in the high 90s. DVT prophylaxis - lovenox SUP - pepcid Nutrition - NPO + TPN Goals of care - full code  I believe we can signoff at this time. Pleas call if additional assistance is needed.   Micheal Likens MD [facility]

## 2017-05-28 NOTE — Progress Notes (Signed)
Subjective:  More alert and awake now. Tolerating clear liquids. Denies any complaints.  Objective:  Vital Signs in the last 24 hours: Temp:  [97.2 F (36.2 C)-97.9 F (36.6 C)] 97.2 F (36.2 C) (05/18 0734) Pulse Rate:  [43-67] 43 (05/18 0906) Resp:  [11-27] 16 (05/18 0906) BP: (111-132)/(44-117) 123/53 (05/18 0900) SpO2:  [93 %-100 %] 100 % (05/18 0906) Arterial Line BP: (138-164)/(41-58) 147/47 (05/18 0906) Weight:  [125.3 kg (276 lb 3.8 oz)] 125.3 kg (276 lb 3.8 oz) (05/17 1728)  Intake/Output from previous day: 05/17 0701 - 05/18 0700 In: 3782.1 [P.O.:240; I.V.:3492.1; IV Piggyback:50] Out: 2695 [Urine:2625; Drains:45; Stool:25] Intake/Output from this shift: Total I/O In: 408.6 [P.O.:120; I.V.:288.6] Out: 200 [Urine:200]  Physical Exam: Neck: no adenopathy, no carotid bruit, no JVD and supple, symmetrical, trachea midline Lungs: Clear anteriorly decreased breath sound at bases Heart: Bradycardic S1 and S2 soft Abdomen: Distended with abdominal binder noted Extremities: No clubbing cyanosis 2+ edema noted  Lab Results: Recent Labs    05/27/17 0423 05/28/17 0326  WBC 20.0* 21.8*  HGB 9.1* 8.6*  PLT 320 314   Recent Labs    05/27/17 0423 05/28/17 0326  NA 140 142  K 3.5 3.6  CL 111 111  CO2 23 24  GLUCOSE 182* 190*  BUN 24* 26*  CREATININE 0.95 1.05   No results for input(s): TROPONINI in the last 72 hours.  Invalid input(s): CK, MB Hepatic Function Panel Recent Labs    05/26/17 0426  PROT 4.7*  ALBUMIN 1.2*  AST 51*  ALT 38  ALKPHOS 110  BILITOT 0.8   No results for input(s): CHOL in the last 72 hours. No results for input(s): PROTIME in the last 72 hours.  Imaging: Imaging results have been reviewed and Ct Image Guided Drainage By Percutaneous Catheter  Result Date: 05/26/2017 INDICATION: History of colon cancer, post LAR with primary colorectal anastomosis and diverting loop ileostomy with umbilical hernia repair on 05/11/2017 by Dr.  Georgette Dover found to have postoperative pneumoperitoneum and perihepatic fluid on abdominal CT performed 05/25/2017 for which request made for placement of a percutaneous drainage catheter for infection source control purposes. EXAM: CT IMAGE GUIDED DRAINAGE BY PERCUTANEOUS CATHETER COMPARISON:  CT abdomen and pelvis - 05/25/2017; 05/19/2013 MEDICATIONS: The patient is currently admitted to the hospital and receiving intravenous antibiotics. The antibiotics were administered within an appropriate time frame prior to the initiation of the procedure. ANESTHESIA/SEDATION: Moderate (conscious) sedation was employed during this procedure. A total of Versed 1 mg and Fentanyl 50 mcg was administered intravenously. Moderate Sedation Time: 14 minutes. The patient's level of consciousness and vital signs were monitored continuously by radiology nursing throughout the procedure under my direct supervision. CONTRAST:  None COMPLICATIONS: None immediate. PROCEDURE: Informed written consent was obtained from patient's family after a discussion of the risks, benefits and alternatives to treatment. The patient was placed supine on the CT gantry and a pre procedural CT was performed re-demonstrating the known abscess/fluid collection within the perihepatic space. The procedure was planned. A timeout was performed prior to the initiation of the procedure. The skin overlying the ventral aspect the right upper abdomen was prepped and draped in the usual sterile fashion. The overlying soft tissues were anesthetized with 1% lidocaine with epinephrine. Appropriate trajectory was planned with the use of a 22 gauge spinal needle. An 18 gauge trocar needle was advanced into the abscess/fluid collection and a short Amplatz super stiff wire was coiled within the collection. Appropriate positioning was confirmed with a  limited CT scan. The tract was serially dilated allowing placement of a 10 [place] all-purpose drainage catheter. Appropriate  positioning was confirmed with a limited postprocedural CT scan. Approximately 10 ml of dark yellow fluid was aspirated. The tube was connected to a JP bulb and sutured in place. A dressing was placed. The patient tolerated the procedure well without immediate post procedural complication. IMPRESSION: Successful CT guided placement of a 10 French all purpose drain catheter into the perihepatic air in fluid collection with aspiration of 10 mL of dark yellow fluid. Samples were sent to the laboratory as requested by the ordering clinical team. Electronically Signed   By: Sandi Mariscal M.D.   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 Status postAcute on chronic renal injury secondary to hypotension/sepsis. 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 Plan Continue present management per surgery/CCM Steroids are being weaned off. Wean off Cardene drip in view of bradycardia    LOS: 22 days    Charolette Forward 05/28/2017, 10:16 AM

## 2017-05-28 NOTE — Progress Notes (Signed)
PHARMACY - ADULT TOTAL PARENTERAL NUTRITION CONSULT NOTE   Pharmacy Consult:  TPN Indication: Prolonged ileus  Patient Measurements: Height: 6' 0.99" (185.4 cm) Weight: 276 lb 3.8 oz (125.3 kg) IBW/kg (Calculated) : 79.88 TPN AdjBW (KG): 94.5 Body mass index is 36.45 kg/m.  Assessment:  38 YOM with PMH significant for IDA with recent rectal bleed, PVD, CAD, HTN, HLD, COPD, and CKD, found to have rectosigmoid colon adenocarcinoma mass s/p lower anterior resection with primary colorectal anastomosis and diverting loop ileostomy with umbilical hernia repair on 05/11/17.  Developed anastomotic leak with feculent output in JP drain. Patient currently with high ileostomy output. Patient's abdomen has become more distended and patient was made NPO. Pharmacy consulted for TPN.   Patient was on clear liquids (on and off NPO as well) but only tolerating minimal intake (<25%) for >10 days. Patient is at risk for refeeding.   GI: abd wound dehiscence.  Prealbumin < 5, oleostomy O/P 75mL, drain O/P down to 73mL.  Pepcid in TPN.  Passed swallow eval 5/17 Endo: no hx DM - hypoglycemic prior to TPN.  CBGs improving off steroid - significant SSI use Insulin requirements in the past 24 hours: 26 units SSI + 15 units in TPN Lytes: K low normal at 3.6, others WNL Renal: SCr up 1.05, BUN 26 - UOP 0.9 ml/kg/hr, +13.7L since admit (up) Pulm: RA >> 2L [place] Cards: BP normal, brady, CVP 8 - nicardipine gtt, IV metoprolol (none given), Lasix 40mg  IV daily, PRN hydralazine.  Marked volume overload per Cards 5/17.  Weaning Cardene and Precedex as able d/t brady. Hepatobil: LFTs / tbili / TG WNL Heme: CT shows spontaneous RTP hematoma - hgb up to 9.1, plts WNL Neuro: Precedex gtt, Oxycodone, PRN Dilaudid/Benadryl/Haldol - GCS 14, CPOT 0, RASS 1  ID: [email or web address]/[email or web address]/[email or web address] for peritonitis. Wound re-opened and packed on 5/12.  Perihepatic fluid drained and cx sent - growing Pseudomonas.  Afebrile, WBC up to 21.8 TPN Access: CVC  triple lumen placed 05/18/17 TPN start date: 05/20/17  Nutritional Goals (per RD rec on 5/9): 2100-2400 kCal, 105-120g protein, 2.1- 2.4 L per day  Current Nutrition:  TPN Clear liquid diet - tolerating   Plan:  Continue concentrated TPN at 70 ml/hr, providing 116g AA, 370g CHO and 52g ILE for a total of 2242 kCal, meeting 100% of patient's needs. Electrolytes in TPN: reduce Na and increase K (100 mEq/d), Cl:Ac 1:1 Daily multivitamin and trace elements in TPN Continue resistant SSI Q4H + increase regular insulin in TPN to 20 units Pepcid 40mg  daily in TPN Add Vitamin C 500mg  and Zinc 5mg  to help with wound healing F/U CBGs/SSI use, PO intake / diet advancement to start weaning TPN  KCL x 6 runs for daily Lasix F/U micro to narrow abx.  Consider stopping vanc.   Jesse Le, PharmD, BCPS, BCCCP Pager:  [phone] 05/28/2017, 10:23 AM

## 2017-05-29 DIAGNOSIS — Z95828 Presence of other vascular implants and grafts: Secondary | ICD-10-CM

## 2017-05-29 DIAGNOSIS — Z932 Ileostomy status: Secondary | ICD-10-CM

## 2017-05-29 DIAGNOSIS — E119 Type 2 diabetes mellitus without complications: Secondary | ICD-10-CM

## 2017-05-29 DIAGNOSIS — D72829 Elevated white blood cell count, unspecified: Secondary | ICD-10-CM

## 2017-05-29 DIAGNOSIS — B965 Pseudomonas (aeruginosa) (mallei) (pseudomallei) as the cause of diseases classified elsewhere: Secondary | ICD-10-CM

## 2017-05-29 DIAGNOSIS — T82339A Leakage of unspecified vascular graft, initial encounter: Secondary | ICD-10-CM

## 2017-05-29 DIAGNOSIS — R509 Fever, unspecified: Secondary | ICD-10-CM

## 2017-05-29 DIAGNOSIS — Z9049 Acquired absence of other specified parts of digestive tract: Secondary | ICD-10-CM

## 2017-05-29 DIAGNOSIS — R58 Hemorrhage, not elsewhere classified: Secondary | ICD-10-CM

## 2017-05-29 LAB — CBC
HCT: 29.3 % — ABNORMAL LOW (ref 39.0–52.0)
Hemoglobin: 9.2 g/dL — ABNORMAL LOW (ref 13.0–17.0)
MCH: 26 pg (ref 26.0–34.0)
MCHC: 31.4 g/dL (ref 30.0–36.0)
MCV: 82.8 fL (ref 78.0–100.0)
PLATELETS: 300 10*3/uL (ref 150–400)
RBC: 3.54 MIL/uL — AB (ref 4.22–5.81)
RDW: 26.5 % — AB (ref 11.5–15.5)
WBC: 25.9 10*3/uL — ABNORMAL HIGH (ref 4.0–10.5)

## 2017-05-29 LAB — GLUCOSE, CAPILLARY
Glucose-Capillary: 104 mg/dL — ABNORMAL HIGH (ref 65–99)
Glucose-Capillary: 107 mg/dL — ABNORMAL HIGH (ref 65–99)
Glucose-Capillary: 126 mg/dL — ABNORMAL HIGH (ref 65–99)
Glucose-Capillary: 128 mg/dL — ABNORMAL HIGH (ref 65–99)
Glucose-Capillary: 142 mg/dL — ABNORMAL HIGH (ref 65–99)

## 2017-05-29 LAB — BASIC METABOLIC PANEL
Anion gap: 7 (ref 5–15)
BUN: 27 mg/dL — AB (ref 6–20)
CO2: 22 mmol/L (ref 22–32)
Calcium: 7.2 mg/dL — ABNORMAL LOW (ref 8.9–10.3)
Chloride: 111 mmol/L (ref 101–111)
Creatinine, Ser: 1.15 mg/dL (ref 0.61–1.24)
GFR calc Af Amer: >60 mL/min (ref >60)
GFR calc non Af Amer: >60 mL/min (ref >60)
GLUCOSE: 114 mg/dL — AB (ref 65–99)
POTASSIUM: 4.1 mmol/L (ref 3.5–5.1)
Sodium: 140 mmol/L (ref 135–145)

## 2017-05-29 MED ORDER — ORAL CARE MOUTH RINSE: 15.0000 mL | Freq: Two times a day (BID) | OROMUCOSAL | Status: DC

## 2017-05-29 MED ORDER — CHLORHEXIDINE GLUCONATE 0.12 % MT SOLN
15.0000 mL | Freq: Two times a day (BID) | OROMUCOSAL | Status: DC
  Administered 2017-05-30 – 2017-05-31 (×3): 15 mL via OROMUCOSAL
  Filled 2017-05-29 (×3): qty 15

## 2017-05-29 MED ORDER — INSULIN ASPART 100 UNIT/ML ~~LOC~~ SOLN
0.0000 [IU] | SUBCUTANEOUS | Status: DC
  Administered 2017-05-29 (×4): 2 [IU] via SUBCUTANEOUS
  Administered 2017-05-30 (×3): 3 [IU] via SUBCUTANEOUS
  Administered 2017-05-30 (×2): 2 [IU] via SUBCUTANEOUS
  Administered 2017-05-31 – 2017-06-01 (×5): 3 [IU] via SUBCUTANEOUS
  Administered 2017-06-01: 5 [IU] via SUBCUTANEOUS

## 2017-05-29 MED ORDER — ZINC TRACE METAL 1 MG/ML IV SOLN
INTRAVENOUS | Status: AC
  Administered 2017-05-29: 18:00:00 via INTRAVENOUS
  Filled 2017-05-29: qty 772.8

## 2017-05-29 NOTE — Progress Notes (Signed)
PHARMACY - ADULT TOTAL PARENTERAL NUTRITION CONSULT NOTE   Pharmacy Consult:  TPN Indication: Prolonged ileus  Patient Measurements: Height: 6' 0.99" (185.4 cm) Weight: (P) 282 lb 13.6 oz (128.3 kg) IBW/kg (Calculated) : 79.88 TPN AdjBW (KG): 94.5 Body mass index is 37.33 kg/m (pended).  Assessment:  37 YOM with PMH significant for IDA with recent rectal bleed, PVD, CAD, HTN, HLD, COPD, and CKD, found to have rectosigmoid colon adenocarcinoma mass s/p lower anterior resection with primary colorectal anastomosis and diverting loop ileostomy with umbilical hernia repair on 05/11/17.  Developed anastomotic leak with feculent output in JP drain. Patient currently with high ileostomy output. Patient's abdomen has become more distended and patient was made NPO. Pharmacy consulted for TPN.   Patient was on clear liquids (on and off NPO as well) but only tolerating minimal intake (<25%) for >10 days. Patient is at risk for refeeding.   GI: abd wound dehiscence.  Prealbumin < 5, no ileostomy O/P, drain O/P 14mL.  Pepcid in TPN.  Passed swallow eval 5/17 Endo: no hx DM - hypoglycemic prior to TPN.  CBGs tightly controlled with 20 units in TPN and off steroid Insulin requirements in the past 24 hours: 18 units SSI + 20 units in TPN Lytes: all WNL Renal: SCr up 1.15, BUN 27 - UOP 0.8 ml/kg/hr, +16.3L since admit (up) Pulm: increased to 4L [place] Cards: BP normal, brady, CVP 8 - off nicardipine gtt, IV labetalol, Lasix 40mg  IV daily, PRN hydralazine.  Marked volume overload per Cards 5/17. Hepatobil: LFTs / tbili / TG WNL Heme: CT shows spontaneous RTP hematoma - hgb up to 9.1, plts WNL Neuro: Precedex gtt, PRN Dilaudid/Benadryl/Haldol - GCS 14, CPOT 0 - still confused ID: [email or web address]/Zosyn/Eraxis for peritonitis. Perihepatic fluid drained and cx sent - growing Pseudomonas.  Tmax 100.8, WBC up to 25.9 TPN Access: CVC triple lumen placed 05/18/17 TPN start date: 05/20/17  Nutritional Goals (per RD rec on  5/9): 2100-2400 kCal, 105-120g protein, 2.1- 2.4 L per day  Current Nutrition:  TPN Clear liquid diet - tolerating   Plan:  Continue concentrated TPN at 70 ml/hr, providing 116g AA, 370g CHO and 52g ILE for a total of 2242 kCal, meeting 100% of patient's needs. Electrolytes in TPN: no change today (100 mEq K, Cl:Ac 1:1) Daily multivitamin and trace elements in TPN Change SSI to moderate Q4H + reduce regular insulin in TPN to 15 units Pepcid 40mg  daily in TPN Vitamin C 500mg  and Zinc 5mg  to help with wound healing (started 5/18) F/U PO intake / diet advancement to start weaning TPN, narrowing abx   Jesse Le, PharmD, BCPS, BCCCP Pager:  [phone] 05/29/2017, 9:27 AM

## 2017-05-29 NOTE — Consult Note (Addendum)
[facility]ious [facility]    Date of Admission:  05/06/2017   Total days of antibiotics: Zosyn 5/7 > 5/8        Zosyn 5/10>>        Unasyn 5/8 > 5/10        Vancomycin 5/7 >        Eraxis 5/8 >                 Reason for Consult: leukocytosis    Referring Provider: Debbora Dus   Assessment: Peritonitis Retroperitoneal hemorrhage Anastomotic leak Colon cancer DM2  Plan: 1. Stop eraxis 2. If clinically stable, consider stopping vanco 3. Consider changing pic site 4. Consider repeat CT scan  Comment- He has multiple possible causes of fever (peritonitis, retroperitoneal bleed, anastomotic leak, erythema at pic site).  Would suggest retroperitoneal bleed as most likely current cause of fever but any (and all) of above could be contributing.  We will follow.   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 chlorhexidine  15 mL Mouth Rinse BID  . Chlorhexidine Gluconate Cloth  6 each Topical Daily  . furosemide  40 mg Intravenous Daily  . insulin aspart  0-15 Units Subcutaneous Q4H  . labetalol  20 mg Intravenous Q6H  . lip balm  1 application Topical BID  . mouth rinse  15 mL Mouth Rinse Q4H  . sodium chloride flush  5 mL Intracatheter Q8H    HPI: Jesse Le is a 72 y.o. male with DM2, adm on 4-26 with wt loss and hematochezia. He was found to have 9 cm rectosigmoid colon mass/adenoCa.He underwent resection on 5-1 with diverting ileostomy. 0/15  LN.  He has also been noted to have wound dehisc on 5-16.  By 5-8 he had mental status change and hypotension, hypoglycemia. He was transferred to ICU. He was found to have an anastomotic leak on CT on 5-9.  He underwent IR drain placement of perihepatic fluid collection found (as well as pneumoperitoneum) on 5-16 and Cx grew pseudomonas. Pt pulled this drain out while confused.  Over last 24h patient has had low grade temps (100.8) and has been noted to have worsening leukocytosis (5-13 11--> 25.9 today.).   Review of Systems: Review of Systems  Unable to perform ROS: Critical illness  Constitutional: Positive for fever.  Respiratory: Negative for cough and shortness of breath.   Gastrointestinal: Negative for constipation and diarrhea.    Past Medical History:  Diagnosis Date  . Aneurysm of aorta (HCC)   . Coronary artery disease   . Diabetes mellitus without complication ([place])    NEW ONSET 09/2012  . Hypertension   . Myocardial infarct [facility])     Social History   Tobacco Use  . Smoking status: Former Smoker    Packs/day: 0.50    Types: Cigarettes    Last attempt to quit: 07/20/2011    Years since quitting: 5.8  . Smokeless tobacco: Never  Used  Substance Use Topics  . Alcohol use: Yes    Alcohol/week: 0.6 oz    Types: 1 Shots of liquor per week    Comment: weekends  . Drug use: No    History reviewed. No pertinent family history.   Medications:  Scheduled: . chlorhexidine  15 mL Mouth Rinse BID  . Chlorhexidine Gluconate Cloth  6 each Topical Daily  . furosemide  40 mg Intravenous Daily  . insulin aspart  0-15 Units Subcutaneous Q4H  . labetalol  20 mg Intravenous Q6H  . lip balm  1 application Topical BID  . mouth rinse  15 mL Mouth Rinse Q4H  . sodium chloride flush  5 mL Intracatheter Q8H    Abtx:  Anti-infectives (From admission, onward)   Start     Dose/Rate Route Frequency Ordered Stop   05/28/17 1200  vancomycin (VANCOCIN) 1,250 mg in sodium chloride  0.9 % 250 mL IVPB     1,250 mg 166.7 mL/hr over 90 Minutes Intravenous Every 24 hours 05/27/17 1324     05/27/17 1100  vancomycin (VANCOCIN) 1,250 mg in sodium chloride 0.9 % 250 mL IVPB  Status:  Discontinued     1,250 mg 166.7 mL/hr over 90 Minutes Intravenous Every 12 hours 05/27/17 1011 05/27/17 1324   05/20/17 2230  vancomycin (VANCOCIN) 1,250 mg in sodium chloride 0.9 % 250 mL IVPB  Status:  Discontinued     1,250 mg 166.7 mL/hr over 90 Minutes Intravenous Every 24 hours 05/20/17 2151 05/27/17 1011   05/20/17 1400  piperacillin-tazobactam (ZOSYN) IVPB 3.375 g     3.375 g 12.5 mL/hr over 240 Minutes Intravenous Every 8 hours 05/20/17 1044     05/20/17 0500  anidulafungin (ERAXIS) 100 mg in sodium chloride 0.9 % 100 mL IVPB     100 mg 78 mL/hr over 100 Minutes Intravenous Every 24 hours 05/19/17 0116     05/19/17 2200  vancomycin (VANCOCIN) 1,500 mg in sodium chloride 0.9 % 500 mL IVPB  Status:  Discontinued     1,500 mg 250 mL/hr over 120 Minutes Intravenous Every 24 hours 05/18/17 2126 05/20/17 2150   05/19/17 0130  anidulafungin (ERAXIS) 200 mg in sodium chloride 0.9 % 200 mL IVPB     200 mg 78 mL/hr over 200 Minutes Intravenous  Once 05/19/17 0116 05/19/17 0520   05/19/17 0130  Ampicillin-Sulbactam (UNASYN) 3 g in sodium chloride 0.9 % 100 mL IVPB  Status:  Discontinued     3 g 200 mL/hr over 30 Minutes Intravenous Every 8 hours 05/19/17 0118 05/20/17 1044   05/18/17 2200  vancomycin (VANCOCIN) 1,750 mg in sodium chloride 0.9 % 500 mL IVPB     1,750 mg 250 mL/hr over 120 Minutes Intravenous  Once 05/18/17 2126 05/19/17 0400   05/17/17 0930  piperacillin-tazobactam (ZOSYN) IVPB 3.375 g  Status:  Discontinued     3.375 g 12.5 mL/hr over 240 Minutes Intravenous Every 8 hours 05/17/17 0836 05/19/17 0118   05/11/17 2200  cefoTEtan (CEFOTAN) 2 g in sodium chloride 0.9 % 100 mL IVPB     2 g 200 mL/hr over 30 Minutes Intravenous Every 12 hours 05/11/17 1839 05/11/17 2034   05/11/17  0600  cefoTEtan in Dextrose 5% (CEFOTAN) IVPB 2 g     2 g Intravenous On call to O.R. 05/10/17 1142 05/11/17 1416        OBJECTIVE: Blood pressure (!) 130/48, pulse 60, temperature 99.1 F (37.3 C), temperature source Oral, resp. rate (!) 21, height  6' 0.99" (1.854 m), weight 128.3 kg (282 lb 13.6 oz), SpO2 100 %.  Physical Exam  Constitutional: No distress.  HENT:  Head: Normocephalic.  Eyes: Pupils are equal, round, and reactive to light. EOM are normal.  Neck: Neck supple.  Cardiovascular: Normal rate, regular rhythm and normal heart sounds.  Pulmonary/Chest: Effort normal and breath sounds normal.  Abdominal: Soft. Bowel sounds are normal. He exhibits distension.  Wound dressed, dressing clean.   Musculoskeletal: He exhibits edema.  Neurological: He is alert.  Skin:       Lab Results Results for orders placed or performed during the hospital encounter of 05/06/17 (from the past 48 hour(s))  Glucose, capillary     Status: Abnormal   Collection Time: 05/27/17 12:00 PM  Result Value Ref Range   Glucose-Capillary 225 (H) 65 - 99 mg/dL   Comment 1 Notify RN   Glucose, capillary     Status: Abnormal   Collection Time: 05/27/17  4:36 PM  Result Value Ref Range   Glucose-Capillary 183 (H) 65 - 99 mg/dL   Comment 1 Notify RN   Glucose, capillary     Status: Abnormal   Collection Time: 05/27/17  8:24 PM  Result Value Ref Range   Glucose-Capillary 159 (H) 65 - 99 mg/dL   Comment 1 Capillary Specimen   Glucose, capillary     Status: Abnormal   Collection Time: 05/27/17 11:53 PM  Result Value Ref Range   Glucose-Capillary 144 (H) 65 - 99 mg/dL   Comment 1 Capillary Specimen   CBC     Status: Abnormal   Collection Time: 05/28/17  3:26 AM  Result Value Ref Range   WBC 21.8 (H) 4.0 - 10.5 K/uL   RBC 3.33 (L) 4.22 - 5.81 MIL/uL   Hemoglobin 8.6 (L) 13.0 - 17.0 g/dL   HCT 26.9 (L) 39.0 - 52.0 %   MCV 80.8 78.0 - 100.0 fL   MCH 25.8 (L) 26.0 - 34.0 pg   MCHC 32.0 30.0 -  36.0 g/dL   RDW 26.1 (H) 11.5 - 15.5 %   Platelets 314 150 - 400 K/uL    Comment: Performed at [facility], [address] [place], [place] [address]  Basic metabolic panel     Status: Abnormal   Collection Time: 05/28/17  3:26 AM  Result Value Ref Range   Sodium 142 135 - 145 mmol/L   Potassium 3.6 3.5 - 5.1 mmol/L   Chloride 111 101 - 111 mmol/L   CO2 24 22 - 32 mmol/L   Glucose, Bld 190 (H) 65 - 99 mg/dL   BUN 26 (H) 6 - 20 mg/dL   Creatinine, Ser 1.05 0.61 - 1.24 mg/dL   Calcium 7.5 (L) 8.9 - 10.3 mg/dL   GFR calc non Af Amer >60 >60 mL/min   GFR calc Af Amer >60 >60 mL/min    Comment: (NOTE) The eGFR has been calculated using the CKD EPI equation. This calculation has not been validated in all clinical situations. eGFR's persistently <60 mL/min signify possible Chronic Kidney Disease.    Anion gap 7 5 - 15    Comment: Performed at [facility] [address] [place], [place] [address]  Magnesium     Status: None   Collection Time: 05/28/17  3:26 AM  Result Value Ref Range   Magnesium 2.0 1.7 - 2.4 mg/dL    Comment: Performed at [facility] [address], [place], [place] [address]  Glucose,  capillary     Status: Abnormal   Collection Time: 05/28/17  3:59 AM  Result Value Ref Range   Glucose-Capillary 163 (H) 65 - 99 mg/dL   Comment 1 Capillary Specimen   Glucose, capillary     Status: Abnormal   Collection Time: 05/28/17  7:39 AM  Result Value Ref Range   Glucose-Capillary 162 (H) 65 - 99 mg/dL   Comment 1 Capillary Specimen   Glucose, capillary     Status: Abnormal   Collection Time: 05/28/17 11:42 AM  Result Value Ref Range   Glucose-Capillary 174 (H) 65 - 99 mg/dL   Comment 1 Capillary Specimen   Glucose, capillary     Status: Abnormal   Collection Time: 05/28/17  4:41 PM  Result Value Ref Range   Glucose-Capillary 131 (H) 65 - 99 mg/dL   Comment 1 Capillary Specimen   Glucose, capillary     Status: Abnormal   Collection Time:  05/28/17  8:11 PM  Result Value Ref Range   Glucose-Capillary 104 (H) 65 - 99 mg/dL   Comment 1 Notify RN   Glucose, capillary     Status: Abnormal   Collection Time: 05/28/17 11:52 PM  Result Value Ref Range   Glucose-Capillary 104 (H) 65 - 99 mg/dL   Comment 1 Notify RN   Glucose, capillary     Status: Abnormal   Collection Time: 05/29/17  4:18 AM  Result Value Ref Range   Glucose-Capillary 107 (H) 65 - 99 mg/dL  CBC     Status: Abnormal   Collection Time: 05/29/17  4:20 AM  Result Value Ref Range   WBC 25.9 (H) 4.0 - 10.5 K/uL    Comment: REPEATED TO VERIFY   RBC 3.54 (L) 4.22 - 5.81 MIL/uL   Hemoglobin 9.2 (L) 13.0 - 17.0 g/dL   HCT 29.3 (L) 39.0 - 52.0 %   MCV 82.8 78.0 - 100.0 fL   MCH 26.0 26.0 - 34.0 pg   MCHC 31.4 30.0 - 36.0 g/dL   RDW 26.5 (H) 11.5 - 15.5 %   Platelets 300 150 - 400 K/uL    Comment: Performed at [facility], [address], [place], [place] [address]  Basic metabolic panel     Status: Abnormal   Collection Time: 05/29/17  4:20 AM  Result Value Ref Range   Sodium 140 135 - 145 mmol/L   Potassium 4.1 3.5 - 5.1 mmol/L   Chloride 111 101 - 111 mmol/L   CO2 22 22 - 32 mmol/L   Glucose, Bld 114 (H) 65 - 99 mg/dL   BUN 27 (H) 6 - 20 mg/dL   Creatinine, Ser 1.15 0.61 - 1.24 mg/dL   Calcium 7.2 (L) 8.9 - 10.3 mg/dL   GFR calc non Af Amer >60 >60 mL/min   GFR calc Af Amer >60 >60 mL/min    Comment: (NOTE) The eGFR has been calculated using the CKD EPI equation. This calculation has not been validated in all clinical situations. eGFR's persistently <60 mL/min signify possible Chronic Kidney Disease.    Anion gap 7 5 - 15    Comment: Performed at [facility] [address], [place], [place] [address]  Glucose, capillary     Status: Abnormal   Collection Time: 05/29/17  8:41 AM  Result Value Ref Range   Glucose-Capillary 126 (H) 65 - 99 mg/dL   Comment 1 Capillary Specimen       Component Value Date/Time   SDES ABSCESS 05/26/2017  1203   SPECREQUEST Normal 05/26/2017 1203   CULT  05/26/2017 1203    FEW PSEUDOMONAS AERUGINOSA NO ANAEROBES ISOLATED; CULTURE IN PROGRESS FOR 5 DAYS    REPTSTATUS PENDING 05/26/2017 1203   No results found. Recent Results (from the past 240 hour(s))  Aerobic/Anaerobic Culture (surgical/deep wound)     Status: None (Preliminary result)   Collection Time: 05/26/17 12:03 PM  Result Value Ref Range Status   Specimen Description ABSCESS  Final   Special Requests Normal  Final   Gram Stain   Final    FEW WBC PRESENT, PREDOMINANTLY PMN FEW GRAM POSITIVE COCCI IN PAIRS RARE GRAM NEGATIVE RODS RARE GRAM POSITIVE RODS Performed at [facility], [place] [address] [place], [place] [address]    Culture   Final    FEW PSEUDOMONAS AERUGINOSA NO ANAEROBES ISOLATED; CULTURE IN PROGRESS FOR 5 DAYS    Report Status PENDING  Incomplete   Organism ID, Bacteria PSEUDOMONAS AERUGINOSA  Final      Susceptibility   Pseudomonas aeruginosa - MIC*    CEFTAZIDIME <=1 SENSITIVE Sensitive     CIPROFLOXACIN <=0.25 SENSITIVE Sensitive     GENTAMICIN <=1 SENSITIVE Sensitive     IMIPENEM <=0.25 SENSITIVE Sensitive     PIP/TAZO <=4 SENSITIVE Sensitive     CEFEPIME <=1 SENSITIVE Sensitive     * FEW PSEUDOMONAS AERUGINOSA    Microbiology: Recent Results (from the past 240 hour(s))  Aerobic/Anaerobic Culture (surgical/deep wound)     Status: None (Preliminary result)   Collection Time: 05/26/17 12:03 PM  Result Value Ref Range Status   Specimen Description ABSCESS  Final   Special Requests Normal  Final   Gram Stain   Final    FEW WBC PRESENT, PREDOMINANTLY PMN FEW GRAM POSITIVE COCCI IN PAIRS RARE GRAM NEGATIVE RODS RARE GRAM POSITIVE RODS Performed at [facility], [address], [place], [place] [address]    Culture   Final    FEW PSEUDOMONAS AERUGINOSA NO ANAEROBES ISOLATED; CULTURE IN PROGRESS FOR 5 DAYS    Report Status PENDING  Incomplete   Organism ID, Bacteria PSEUDOMONAS  AERUGINOSA  Final      Susceptibility   Pseudomonas aeruginosa - MIC*    CEFTAZIDIME <=1 SENSITIVE Sensitive     CIPROFLOXACIN <=0.25 SENSITIVE Sensitive     GENTAMICIN <=1 SENSITIVE Sensitive     IMIPENEM <=0.25 SENSITIVE Sensitive     PIP/TAZO <=4 SENSITIVE Sensitive     CEFEPIME <=1 SENSITIVE Sensitive     * FEW PSEUDOMONAS AERUGINOSA    Radiographs and labs were personally reviewed by me.   Bobby Rumpf, MD [facility] for [facility] [phone] 05/29/2017, 11:42 AM

## 2017-05-29 NOTE — Progress Notes (Signed)
Subjective:  Appreciate all consultants help Patient is more alert and awake follows commands appropriately. Denies any chest pain or shortness of breath denies abdominal pain. White count remains elevated. Abdominal paracentesis fluid positive for a few Pseudomonas aeruginosa.  Objective:  Vital Signs in the last 24 hours: Temp:  [96.9 F (36.1 C)-100.8 F (38.2 C)] 99.1 F (37.3 C) (05/19 0837) Pulse Rate:  [51-133] 67 (05/19 0900) Resp:  [13-25] 13 (05/19 0900) BP: (106-141)/(48-96) 130/48 (05/19 0500) SpO2:  [95 %-100 %] 100 % (05/19 0900) Arterial Line BP: (132-175)/(39-56) 132/39 (05/19 0900) Weight:  [128.3 kg (282 lb 13.6 oz)] 128.3 kg (282 lb 13.6 oz) (05/19 0600)  Intake/Output from previous day: 05/18 0701 - 05/19 0700 In: 4615.1 [P.O.:1620; I.V.:2690.1; IV Piggyback:300] Out: 2508 [Urine:2400; Drains:108] Intake/Output from this shift: Total I/O In: 154.4 [I.V.:154.4] Out: 350 [Urine:175; Stool:175]  Physical Exam: Neck: no adenopathy, no carotid bruit, no JVD and supple, symmetrical, trachea midline Lungs: Clear to auscultation anteriorly Heart: regular rate and rhythm, S1, S2 normal and Soft systolic murmur noted Abdomen: Distended abdominal binder noted Extremities: No clubbing cyanosis  2+ edema noted  Lab Results: Recent Labs    05/28/17 0326 05/29/17 0420  WBC 21.8* 25.9*  HGB 8.6* 9.2*  PLT 314 300   Recent Labs    05/28/17 0326 05/29/17 0420  NA 142 140  K 3.6 4.1  CL 111 111  CO2 24 22  GLUCOSE 190* 114*  BUN 26* 27*  CREATININE 1.05 1.15   No results for input(s): TROPONINI in the last 72 hours.  Invalid input(s): CK, MB Hepatic Function Panel No results for input(s): PROT, ALBUMIN, AST, ALT, ALKPHOS, BILITOT, BILIDIR, IBILI in the last 72 hours. No results for input(s): CHOL in the last 72 hours. No results for input(s): PROTIME in the last 72 hours.  Imaging: Imaging results have been reviewed and No results found.  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 Marked leukocytosis Status postAcute on chronic renal injury secondary to hypotension/sepsis. 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   LOS: 23 days    Jesse Le, Jesse Le 05/29/2017, 10:08 AM

## 2017-05-29 NOTE — Progress Notes (Signed)
Per Dr. Dema Severin, pt to stay in bed today due for stabilization of open abdominal wound.   Shella Spearing, RN

## 2017-05-29 NOTE — Progress Notes (Signed)
18 Days Post-Op   Subjective/Chief Complaint: Tolerated clears; hungry. No other complaints today.   Objective: Vital signs in last 24 hours: Temp:  [96.9 F (36.1 C)-100.8 F (38.2 C)] 99.1 F (37.3 C) (05/19 0837) Pulse Rate:  [51-133] 67 (05/19 0900) Resp:  [13-25] 13 (05/19 0900) BP: (106-141)/(48-96) 130/48 (05/19 0500) SpO2:  [95 %-100 %] 100 % (05/19 0900) Arterial Line BP: (132-175)/(39-56) 132/39 (05/19 0900) Weight:  [128.3 kg (282 lb 13.6 oz)] 128.3 kg (282 lb 13.6 oz) (05/19 0600) Last BM Date: 05/25/17  Intake/Output from previous day: 05/18 0701 - 05/19 0700 In: 4615.1 [P.O.:1620; I.V.:2690.1; IV Piggyback:300] Out: 2508 [Urine:2400; Drains:108] Intake/Output this shift: Total I/O In: 154.4 [I.V.:154.4] Out: 350 [Urine:175; Stool:175]  General appearance: cooperative GI: ostomy with stool, midline with some fascial separation and some slough, turqousie staining of wet to dry  Lab Results:  Recent Labs    05/28/17 0326 05/29/17 0420  WBC 21.8* 25.9*  HGB 8.6* 9.2*  HCT 26.9* 29.3*  PLT 314 300   BMET Recent Labs    05/28/17 0326 05/29/17 0420  NA 142 140  K 3.6 4.1  CL 111 111  CO2 24 22  GLUCOSE 190* 114*  BUN 26* 27*  CREATININE 1.05 1.15  CALCIUM 7.5* 7.2*   PT/INR No results for input(s): LABPROT, INR in the last 72 hours. ABG No results for input(s): PHART, HCO3 in the last 72 hours.  Invalid input(s): PCO2, PO2  Studies/Results: No results found.  Anti-infectives: Anti-infectives (From admission, onward)   Start     Dose/Rate Route Frequency Ordered Stop   05/28/17 1200  vancomycin (VANCOCIN) 1,250 mg in sodium chloride 0.9 % 250 mL IVPB     1,250 mg 166.7 mL/hr over 90 Minutes Intravenous Every 24 hours 05/27/17 1324     05/27/17 1100  vancomycin (VANCOCIN) 1,250 mg in sodium chloride 0.9 % 250 mL IVPB  Status:  Discontinued     1,250 mg 166.7 mL/hr over 90 Minutes Intravenous Every 12 hours 05/27/17 1011 05/27/17 1324    05/20/17 2230  vancomycin (VANCOCIN) 1,250 mg in sodium chloride 0.9 % 250 mL IVPB  Status:  Discontinued     1,250 mg 166.7 mL/hr over 90 Minutes Intravenous Every 24 hours 05/20/17 2151 05/27/17 1011   05/20/17 1400  piperacillin-tazobactam (ZOSYN) IVPB 3.375 g     3.375 g 12.5 mL/hr over 240 Minutes Intravenous Every 8 hours 05/20/17 1044     05/20/17 0500  anidulafungin (ERAXIS) 100 mg in sodium chloride 0.9 % 100 mL IVPB     100 mg 78 mL/hr over 100 Minutes Intravenous Every 24 hours 05/19/17 0116     05/19/17 2200  vancomycin (VANCOCIN) 1,500 mg in sodium chloride 0.9 % 500 mL IVPB  Status:  Discontinued     1,500 mg 250 mL/hr over 120 Minutes Intravenous Every 24 hours 05/18/17 2126 05/20/17 2150   05/19/17 0130  anidulafungin (ERAXIS) 200 mg in sodium chloride 0.9 % 200 mL IVPB     200 mg 78 mL/hr over 200 Minutes Intravenous  Once 05/19/17 0116 05/19/17 0520   05/19/17 0130  Ampicillin-Sulbactam (UNASYN) 3 g in sodium chloride 0.9 % 100 mL IVPB  Status:  Discontinued     3 g 200 mL/hr over 30 Minutes Intravenous Every 8 hours 05/19/17 0118 05/20/17 1044   05/18/17 2200  vancomycin (VANCOCIN) 1,750 mg in sodium chloride 0.9 % 500 mL IVPB     1,750 mg 250 mL/hr over 120 Minutes Intravenous  Once 05/18/17 2126 05/19/17 0400   05/17/17 0930  piperacillin-tazobactam (ZOSYN) IVPB 3.375 g  Status:  Discontinued     3.375 g 12.5 mL/hr over 240 Minutes Intravenous Every 8 hours 05/17/17 0836 05/19/17 0118   05/11/17 2200  cefoTEtan (CEFOTAN) 2 g in sodium chloride 0.9 % 100 mL IVPB     2 g 200 mL/hr over 30 Minutes Intravenous Every 12 hours 05/11/17 1839 05/11/17 2034   05/11/17 0600  cefoTEtan in Dextrose 5% (CEFOTAN) IVPB 2 g     2 g Intravenous On call to O.R. 05/10/17 1142 05/11/17 1416      Assessment/Plan: s/p Procedure(s): Colon Resection Sigmoid Low Anterior Resection with Staple Colon Relast (N/A) HERNIA REPAIR UMBILICAL ADULT (N/A) Creation Vertical Loop ILEOSTOMY  (N/A) Coronary artery disease. History of myocardial infarction Hypertension Status post thoracic aortic dissection and stent placement  status post bilateral iliac stent placements COPD AKI/CKD II- creatinine1.15 today - improved. UOP 2.4L/24hrs; drain 108cc, serous; ileostomy 175cc overnight +200-300 more today NIDDM-hypoglycemia resolved. Chronic anemia- hgb9.2 - monitor Obesity Hypokalemia -resolved; continue to monitor Agitation -  Now restrained and on precedex - CCM following   Reducible umbilical hernia Rectosigmoid colon mass- S/p LAR with primary colorectal (EE)anastomosis and diverting loop ileostomy with umbilical hernia repair 04/19/68 Dr. Georgette Dover -Pathology shows adenoCA, 0/15 LNs -wet to dry to wound  -Low colorectal anastomotic leak.Diverted.JP drain pulled out inadvertently due to confusion.   -repeat CT scan shows no new fluid collection in pelvis near his anastomotic leak, but a new perihepatic collection.  This was drained by IR  -Advance to soft; continue TPN until reliably tolerating -oncology outpatient follow-up arranged  Spontaneous RTP hematoma with ABL anemia  Severe protein calorie malnutrition -TNA -prealbumin <5 and albumin less than 1.0   FEN:NPO/TNA/IVFs VTE: SCDs, lovenox YO:VZCHY 5/7 -->5/9 Unasyn 5/9-->5/10Vanc 5/9-->eraxis 5/9-->  Zosyn 5/10-->    LOS: 23 days    Ileana Roup 05/29/2017

## 2017-05-29 NOTE — Progress Notes (Signed)
PULMONARY / CRITICAL CARE MEDICINE   Name: Jesse Le MRN: [id number] DOB: July 02, 1945    ADMISSION DATE:  05/06/2017 CONSULTATION DATE:  05/18/2017  REFERRING MD:  Dr. Terrence Dupont  CHIEF COMPLAINT:  Hypotension  HISTORY OF PRESENT ILLNESS:   72 yo male presented with abdominal pain, weight loss and intermittent hematochezia.  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  Had low anterior resection with primary anastomosis with diverting loop ileostomy.  Developed hypotension, hypoglycemia 5/07.  Developed hypotension and altered mental status 5/08 and transferred to ICU. Pt has an anastomotic leak, drain placed by surgery PMHx of CAD, DM, TAA.  5/13 The patient is awake and tracking but not really verbal. BP is running a bit high. 170-180. Patient remains ion a small dose of precedex at 0.23mcg/kg. respiratotory status appears comfortable. Does not appear to be onpressors at this time.  5/14 Little overall change in status The patient rermains lethargic and non-communicative. BP has been high requiring nicardipine drip in addition to prn labetalol and hydralazine Discussed the situation with surgery. The patient had pulled out his RLQ drain. A new CT scan ihas been ordered and he may need drain replaced.  5/15 The patient is awake and up in the chair. He went down for a CT scan today. There was less extraluminal gas in the area of the anastomosis. There is increasing ascities. A new spontaneous right retroperitoneal hematoma was noted as well. The patient remains hemodynamically stable. He is more awake and speaking to his girfriend.  5/17 The patient is awake. He appears to be a little tearful. Whe he speaks it is vvery difficult for me to understand him. He apparently  Was a bit delirious overnight. He is still requiring Precedex in varying dose. We don't have the ability yest to substitute an oral agent .  5/18 The patient is awake and talkative. He is  being allowed to take clear liquids. His BP remains elevated He has a heart rate of about 50 likely related to precedex. The patient is on a carden e drip as well to keep his BP < 295 systolic. The patient remains onTPN.  5/19 The patient looks well. He is cooperative and communicative. No major compliants. He had a T max last night to about 100.8. There is some concern as he has a persistent and worsening leukocytosis although he had clinically looked better the past few days.The patient did grow out Pseduomonas on paracentesis and is on Zosyn the past several days. I have taken the liberty of asking Dr. Johnnye Sima of [facility] to weigh in on the case. The patient was placed on Hydrocortisone a few days ago when he wass havbing intractabkle shock. His agitation is better. He is on Seroquel. precedex is being weaned off. The carden has been turned off  VITAL SIGNS: BP (!) 130/48   Pulse 67   Temp 99.1 F (37.3 C) (Oral)   Resp 13   Ht 6' 0.99" (1.854 m)   Wt 282 lb 13.6 oz (128.3 kg)   SpO2 100%   BMI 37.33 kg/m   INTAKE / OUTPUT: I/O last 3 completed shifts: In: 6222.6 [P.O.:1620; I.V.:4247.6; Other:5; IV Piggyback:350] Out: 6213 [Urine:3350; Drains:118]  PHYSICAL EXAMINATION:  General -obese man, no distress Eyes -equal, reactive ENT -port intention, oropharynx clear Cardiac -regular, no murmur, mild pretibial edema Chest -few scattered bilateral inspiratory crackles Abd -ileostomy   JP drain wa s removed by the patient two days ago. Abdominal binder.Abdomen  appears more distended Ext - 2+ lower extremity edema Skin -no rash Neuro -awake, somewhat sleepy, Not really seaking at this time current agitation   LABS:  BMET Recent Labs  Lab 05/27/17 0423 05/28/17 0326 05/29/17 0420  NA 140 142 140  K 3.5 3.6 4.1  CL 111 111 111  CO2 23 24 22   BUN 24* 26* 27*  CREATININE 0.95 1.05 1.15  GLUCOSE 182* 190* 114*    Electrolytes Recent Labs  Lab 05/23/17 0356  05/24/17 0339  05/26/17 0426 05/27/17 0423 05/28/17 0326 05/29/17 0420  CALCIUM 7.3* 7.4*   < > 7.2* 7.2* 7.5* 7.2*  MG 2.2 2.1  --  1.6* 1.8 2.0  --   PHOS 4.6 3.3  --  3.2  --   --   --    < > = values in this interval not displayed.    CBC Recent Labs  Lab 05/27/17 0423 05/28/17 0326 05/29/17 0420  WBC 20.0* 21.8* 25.9*  HGB 9.1* 8.6* 9.2*  HCT 28.3* 26.9* 29.3*  PLT 320 314 300    Coag's Recent Labs  Lab 05/25/17 1638  INR 1.28    Sepsis Markers No results for input(s): LATICACIDVEN, PROCALCITON, O2SATVEN in the last 168 hours.  ABG No results for input(s): PHART, PCO2ART, PO2ART in the last 168 hours.  Liver Enzymes Recent Labs  Lab 05/23/17 0356 05/26/17 0426  AST 34 51*  ALT 20 38  ALKPHOS 56 110  BILITOT 0.6 0.8  ALBUMIN <1.0* 1.2*    Cardiac Enzymes No results for input(s): TROPONINI, PROBNP in the last 168 hours.  Glucose Recent Labs  Lab 05/28/17 1142 05/28/17 1641 05/28/17 2011 05/28/17 2352 05/29/17 0418 05/29/17 0841  GLUCAP 174* 131* 104* 104* 107* 126*    Imaging No results found.   STUDIES:  CT abd 4/26 >> 9 cm mass CT abd 5/09 >> discontinuity in the posterior wall just above the anastomosis with feculent appearing material and gas in the pelvis, measuring up to 5x 3 cm anterior and superior to the sigmoid colon.  Fluid collection right lower quadrant measuring up to 6.4 x 3.5 cm. Moderate pneumoperitoneum.  CULTURES: Blood culture 5/8 >  ANTIBIOTICS: Zosyn 5/7 > 5/8 Zosyn 5/10>> Unasyn 5/8 > 5/10 Vancomycin 5/7 > Eraxis 5/8 >  SIGNIFICANT EVENTS: 4/26 admit for abdominal pain. Mass identified on CT 4/28 colonoscopy. Biopsy sent.  5/1 bx grows adeno. To OR for resection ileostomy 5/8 hypotension hypoglycemia AMS, transfer to ICU 5/16: Large volume paracentesis  LINES/TUBES: Lt IJ CVL  5/8 > JP Drain 5/1>  DISCUSSION: 72 yo male with sepsis from peritonitis after surgery for colon cancer and CT abd +  for  anastomotic leak.>> Drain placed 5/1  ASSESSMENT / PLAN:  Sepsis with septic shock from peritonitis.  Restarted on norepinephrine, overall volume resuscitation is been 6-1/2 L over the last 3 days Colon cancer s/p low anterior resection with ileostomy. Continue current Zosyn, vancomycin, Eraxis JP drain in place appears to be draining purulent fluid IV fluid resuscitation, attempt to wean norepinephrine as able Restart stress dose steroids (had been weaning). It appears that the patient is doing better hemodynamically at this time.   Discussed the plan with surgery re new CT scan and possible new drain placement. The patient is hemodynamically stable. He has been off pressors for 2 days. Surgery spoke to me and they plan on asking IR to do a poaracetesis foir diagnostic and therapeutic purposes. 5/17 Patient s/p diagnostic paracentesis There were few  GPCs  And rare GNR on the gram stain 5/18 The patient hasd been ON Vanco, Zosyn and Eraxis for about 1 week now. As noted this gram stain from the abdomen showed some residual organisms. 5/19  As noted his WBC is rising despite the abx therpay. Grew out a few colonoies of Pseudomoinas in the ascitic fluid. Unclear if there possibly could be another source for the leukocytosis.  Systolic HTN BP has been hard to control. The patient is on a cardene drip at 10mg /hr. In addition there are prn orders for hydralazine and labetalol. Bp is better presently. 5/17 The patient requires Cardene for bp control and is on 12mg /hr. 5/18 I have changed labetalol to q 6h 20 mg. If his heart rate slows down further will need to back off precedex. unfortunatellt cannot take po meds yet. 5/19 BPO is better controlled presently     Acute renal failure from ATN.   Oliguria improved slightly over last 24 hours  Renal fn. Better. Creatinine is now down to 0.95. The patient had 2478 out in the past 24 hours. 5/29 Renal fn., is stable    DM type  II. Hypoglycemia in setting of sepsis, continues to be a problem. Scheduled CBG Restart stress dose steroids, question some degree of adrenal insufficiency contributing to his hypoglycemia Replacing dextrose aggressively in his TPN, discussed with pharmacy on 5/12. Blood sugars are vacillatinmg quite a bit lately. May be up lately due to stress dose steroids. Blood sugar remain a bit elevated. I think we can stop exogenous steroids at this time  That might help with that. Pharmacy may need to increase insulin in the TPN as blood sugarsa are still running a little high. 5/19 Blood sugars are poresently well controledd  Acute metabolic encephalopathy 2nd to sepsis. Improving Follow mental status off of Precedex Minimize sedating medications Continue to reorient 5/18 I think his mental status is much improved. He is intermittently a little confused  Relative adrenal insufficiency. Hydrocortisone back to 50 mg every 6 hours. I have decreased hydrocort to q 12h. I am going to stop steroiids at this time  CXR persisistent small opacity at the left base. No mjaor cough 02 sats on nasal cannula in the high 90s. DVT prophylaxis - lovenox SUP - pepcid Nutrition - NPO + TPN Goals of care - full code  I believe we can signoff at this time. Pleas call if additional assistance is needed.   Micheal Likens MD [facility]

## 2017-05-30 DIAGNOSIS — Z960 Presence of urogenital implants: Secondary | ICD-10-CM

## 2017-05-30 DIAGNOSIS — Z85030 Personal history of malignant carcinoid tumor of large intestine: Secondary | ICD-10-CM

## 2017-05-30 DIAGNOSIS — Z978 Presence of other specified devices: Secondary | ICD-10-CM

## 2017-05-30 DIAGNOSIS — B351 Tinea unguium: Secondary | ICD-10-CM

## 2017-05-30 DIAGNOSIS — N189 Chronic kidney disease, unspecified: Secondary | ICD-10-CM

## 2017-05-30 DIAGNOSIS — E1151 Type 2 diabetes mellitus with diabetic peripheral angiopathy without gangrene: Secondary | ICD-10-CM

## 2017-05-30 DIAGNOSIS — Z8719 Personal history of other diseases of the digestive system: Secondary | ICD-10-CM

## 2017-05-30 DIAGNOSIS — K651 Peritoneal abscess: Secondary | ICD-10-CM

## 2017-05-30 DIAGNOSIS — N179 Acute kidney failure, unspecified: Secondary | ICD-10-CM

## 2017-05-30 DIAGNOSIS — E785 Hyperlipidemia, unspecified: Secondary | ICD-10-CM

## 2017-05-30 LAB — COMPREHENSIVE METABOLIC PANEL
ALBUMIN: 1.2 g/dL — AB (ref 3.5–5.0)
ALT: 66 U/L — ABNORMAL HIGH (ref 17–63)
AST: 47 U/L — AB (ref 15–41)
Alkaline Phosphatase: 269 U/L — ABNORMAL HIGH (ref 38–126)
Anion gap: 9 (ref 5–15)
BUN: 29 mg/dL — AB (ref 6–20)
CHLORIDE: 107 mmol/L (ref 101–111)
CO2: 24 mmol/L (ref 22–32)
Calcium: 7.3 mg/dL — ABNORMAL LOW (ref 8.9–10.3)
Creatinine, Ser: 1.36 mg/dL — ABNORMAL HIGH (ref 0.61–1.24)
GFR calc Af Amer: 58 mL/min — ABNORMAL LOW (ref >60)
GFR calc non Af Amer: 50 mL/min — ABNORMAL LOW (ref >60)
GLUCOSE: 162 mg/dL — AB (ref 65–99)
POTASSIUM: 4.3 mmol/L (ref 3.5–5.1)
Sodium: 140 mmol/L (ref 135–145)
Total Bilirubin: 1.4 mg/dL — ABNORMAL HIGH (ref 0.3–1.2)
Total Protein: 5.3 g/dL — ABNORMAL LOW (ref 6.5–8.1)

## 2017-05-30 LAB — CBC
HEMATOCRIT: 30.8 % — AB (ref 39.0–52.0)
HEMOGLOBIN: 8.9 g/dL — AB (ref 13.0–17.0)
MCH: 28.3 pg (ref 26.0–34.0)
MCHC: 28.9 g/dL — AB (ref 30.0–36.0)
MCV: 98.1 fL (ref 78.0–100.0)
Platelets: 268 10*3/uL (ref 150–400)
RBC: 3.14 MIL/uL — ABNORMAL LOW (ref 4.22–5.81)
RDW: 28.6 % — ABNORMAL HIGH (ref 11.5–15.5)
WBC: 25.3 10*3/uL — ABNORMAL HIGH (ref 4.0–10.5)

## 2017-05-30 LAB — PREALBUMIN: Prealbumin: 21.7 mg/dL (ref 18–38)

## 2017-05-30 LAB — PHOSPHORUS: Phosphorus: 3.8 mg/dL (ref 2.5–4.6)

## 2017-05-30 LAB — GLUCOSE, CAPILLARY
GLUCOSE-CAPILLARY: 143 mg/dL — AB (ref 65–99)
GLUCOSE-CAPILLARY: 147 mg/dL — AB (ref 65–99)
GLUCOSE-CAPILLARY: 149 mg/dL — AB (ref 65–99)
GLUCOSE-CAPILLARY: 189 mg/dL — AB (ref 65–99)
GLUCOSE-CAPILLARY: 245 mg/dL — AB (ref 65–99)
Glucose-Capillary: 117 mg/dL — ABNORMAL HIGH (ref 65–99)
Glucose-Capillary: 168 mg/dL — ABNORMAL HIGH (ref 65–99)
Glucose-Capillary: 196 mg/dL — ABNORMAL HIGH (ref 65–99)

## 2017-05-30 LAB — DIFFERENTIAL
BASOS PCT: 0 %
Basophils Absolute: 0 10*3/uL (ref 0.0–0.1)
EOS PCT: 0 %
Eosinophils Absolute: 0 10*3/uL (ref 0.0–0.7)
LYMPHS ABS: 0.8 10*3/uL (ref 0.7–4.0)
Lymphocytes Relative: 3 %
MONOS PCT: 4 %
Monocytes Absolute: 1 10*3/uL (ref 0.1–1.0)
NEUTROS ABS: 23.5 10*3/uL — AB (ref 1.7–7.7)
Neutrophils Relative %: 93 %

## 2017-05-30 LAB — MAGNESIUM: MAGNESIUM: 1.9 mg/dL (ref 1.7–2.4)

## 2017-05-30 LAB — TRIGLYCERIDES: TRIGLYCERIDES: 75 mg/dL (ref <150)

## 2017-05-30 MED ORDER — ENSURE ENLIVE PO LIQD
237.0000 mL | Freq: Three times a day (TID) | ORAL | Status: DC
  Administered 2017-05-30 – 2017-05-31 (×4): 237 mL via ORAL

## 2017-05-30 MED ORDER — ZINC TRACE METAL 1 MG/ML IV SOLN
INTRAVENOUS | Status: AC
  Administered 2017-05-30: 18:00:00 via INTRAVENOUS
  Filled 2017-05-30: qty 772.8

## 2017-05-30 MED ORDER — METOPROLOL TARTRATE 25 MG PO TABS
25.0000 mg | ORAL_TABLET | Freq: Two times a day (BID) | ORAL | Status: DC
  Administered 2017-05-31 – 2017-06-01 (×3): 25 mg via ORAL
  Filled 2017-05-30 (×4): qty 1

## 2017-05-30 NOTE — Progress Notes (Signed)
CSW continuing to follow and assist with disposition when pt is closer to [place], [place] Social Worker [phone]

## 2017-05-30 NOTE — Progress Notes (Signed)
PHARMACY - ADULT TOTAL PARENTERAL NUTRITION CONSULT NOTE   Pharmacy Consult:  TPN Indication: Prolonged ileus  Patient Measurements: Height: 6' 0.99" (185.4 cm) Weight: 282 lb 13.6 oz (128.3 kg) IBW/kg (Calculated) : 79.88 TPN AdjBW (KG): 94.5 Body mass index is 37.33 kg/m.  Assessment:  25 YOM with PMH significant for IDA with recent rectal bleed, PVD, CAD, HTN, HLD, COPD, and CKD, found to have rectosigmoid colon adenocarcinoma mass s/p lower anterior resection with primary colorectal anastomosis and diverting loop ileostomy with umbilical hernia repair on 05/11/17.  Developed anastomotic leak with feculent output in JP drain. Patient currently with high ileostomy output. Patient's abdomen has become more distended and patient was made NPO. Pharmacy consulted for TPN.   Patient was on clear liquids (on and off NPO as well) but only tolerating minimal intake (<25%) for >10 days. Patient is at risk for refeeding.   GI: abd wound dehiscence. Prealbumin <5 - now up to 21.7, no ileostomy O/P, drain O/P 76mL. Pepcid in TPN. LBM 5/19. Tolerated clears 5/19 > advanced to soft diet with plans to continue TPN until reliably tolerating. Diet "fair" with meal intake of 25% charted - does not want to eat 5/20, backed off to full liquids again Endo: no hx DM - hypoglycemic prior to TPN. CBGs controlled <180. Off steroids  Insulin requirements in the past 24 hours: 10 units SSI + 15 units in TPN Lytes: all WNL except Mag 1.9 (goal >/= 2 with ileus), K trending up to 4.3 with no changes in replacement (likely due to rising SCr) Renal: SCr increasing to 1.36, BUN up to 29 - UOP 0.7 ml/kg/hr, +16.1L since admit Pulm: 2L [place] [address] Cards: BP normal/brady - off nicardipine gtt, IV labetalol scheduled, Lasix 40mg  IV daily, PRN hydralazine. Marked volume overload per Cards 5/17. Hepatobil: LFTs now mildly elevated / tbili up to 1.4 / TG WNL Heme: CT shows spontaneous RTP hematoma - hgb up to 9.1, plts WNL Neuro:  Precedex gtt now off, PRN Dilaudid/Benadryl/Haldol - GCS 15, CPOT 0 - still confused ID: Vanc/Zosyn for peritonitis. Perihepatic fluid drained and cx sent - growing pan-sensitive Pseudomonas. ID stopped Eraxis 5/19, recommends d/c vanc if clinically stable. afebrile, WBC 25.3 stable TPN Access: CVC triple lumen placed 05/18/17 TPN start date: 05/20/17  Nutritional Goals (per RD rec on 5/9): 2100-2400 kCal, 105-120g protein, 2.1- 2.4 L per day  Current Nutrition:  TPN Soft diet > back to full liquid 5/20   Plan:  Continue concentrated TPN at 70 ml/hr, providing 116g AA, 370g CHO and 52g ILE for a total of 2242 kCal, meeting 100% of patient's needs. Electrolytes in TPN: reduce K 5/20 with SCr bump, increase Mag slightly 5/20; Cl:Ac 1:1 Daily multivitamin and trace elements in TPN Continue SSI moderate Q4H + regular insulin 15 units added to TPN and monitor CBGs Pepcid 40mg  daily in TPN Vitamin C 500mg  and Zinc 5mg  added to TPN to help with wound healing (started 5/18) F/U PO intake / diet advancement to start weaning TPN, narrowing abx  Elicia Lamp, PharmD, BCPS Clinical Pharmacist Clinical phone for 05/30/2017 until 3:30pm: [phone] If after 3:30pm, please call main pharmacy at: [phone] 05/30/2017 7:48 AM

## 2017-05-30 NOTE — Progress Notes (Signed)
Subjective:  Patient awake, denies any complaints.head low-grade temp of 100.2 degrees Fahrenheit last night.  WBC count remains markedly elevated.  Underwent debridement of necrotic fascia earlier today by surgery.  Albumin remains low  Objective:  Vital Signs in the last 24 hours: Temp:  [97.4 F (36.3 C)-100.2 F (37.9 C)] 99.4 F (37.4 C) (05/20 0700) Pulse Rate:  [63-101] 71 (05/20 1100) Resp:  [19-25] 23 (05/20 1100) BP: (94-149)/(43-104) 138/64 (05/20 1100) SpO2:  [84 %-100 %] 100 % (05/20 1100) Arterial Line BP: (184)/(72) 184/72 (05/19 1300)  Intake/Output from previous day: 05/19 0701 - 05/20 0700 In: 2181.2 [P.O.:100; I.V.:1581.2; IV Piggyback:500] Out: 2600 [Urine:2120; Drains:5; Stool:475] Intake/Output from this shift: Total I/O In: 250.4 [I.V.:0.4; IV Piggyback:250] Out: 725 [Urine:650; Stool:75]  Physical Exam: Neck: no adenopathy, no carotid bruit, no JVD and supple, symmetrical, trachea midline Lungs: clear to auscultation anteriorly Heart: regular rate and rhythm, S1, S2 normal and soft systolic murmur noted Abdomen: distended abdominal binder noted Extremities: no clubbing, cyanosis, 1+ edema noted  Lab Results: Recent Labs    05/29/17 0420 05/30/17 0344  WBC 25.9* 25.3*  HGB 9.2* 8.9*  PLT 300 268   Recent Labs    05/29/17 0420 05/30/17 0637  NA 140 140  K 4.1 4.3  CL 111 107  CO2 22 24  GLUCOSE 114* 162*  BUN 27* 29*  CREATININE 1.15 1.36*   No results for input(s): TROPONINI in the last 72 hours.  Invalid input(s): CK, MB Hepatic Function Panel Recent Labs    05/30/17 0637  PROT 5.3*  ALBUMIN 1.2*  AST 47*  ALT 66*  ALKPHOS 269*  BILITOT 1.4*   No results for input(s): CHOL in the last 72 hours. No results for input(s): PROTIME in the last 72 hours.  Imaging: Imaging results have been reviewed and No results found.  Cardiac Studies:  Assessment/Plan:  Rectosigmoid adenocarcinoma status post resection with primary  colorectal anastomosis and dilating loop ileostomy and umbilical hernia repair Spontaneous retroperitoneal hematoma Abdominalsurgical wound infection Metabolic encephalopathy Status postSeptichypotensive shock secondary Intra-abdominal sepsis/possible abscess. 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  May need repeat CT if continues to have markedly elevated WBCs Hold Lasix for now  LOS: 24 days    Charolette Forward 05/30/2017, 12:29 PM

## 2017-05-30 NOTE — Progress Notes (Signed)
[facility] for Infectious Disease    Date of Admission:  05/06/2017   Total days of antibiotics 14        Day 14 vanco        Day 14 piptazo           ID: Jesse Le is a 72 y.o. 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  Developed anastomotic leak with feculent output in JP drain. Patient currently with high ileostomy output and started on broad spectrum abtx for intra-abdominal infection, s/p right perihepatic JP drain on 5/16 cx + PsA, small retroperitoneal bleed but in the last 2 days having increasing leukocytosis and fever   Principal Problem:   Cancer of sigmoid s/p LAR rectosigmoid resection 05/11/2017 Active Problems:   Generalized abdominal pain   Iron deficiency anemia due to chronic blood loss   Abnormal CT scan, sigmoid colon   Acute blood loss anemia   Ventral hernia s/p primary repair 05/11/2017   Physical deconditioning   Diabetes mellitus without complication (HCC)   CAD (coronary atherosclerotic disease)   Septic shock (HCC)   Hypoglycemia after GI (gastrointestinal) surgery   Colostomy present ([place])    Subjective: Laying still while doing ADLs, getting bathed with new condom catheter placement. Few words but follows commands.   Medications:  . chlorhexidine  15 mL Mouth Rinse BID  . Chlorhexidine Gluconate Cloth  6 each Topical Daily  . feeding supplement (ENSURE ENLIVE)  237 mL Oral TID BM  . insulin aspart  0-15 Units Subcutaneous Q4H  . labetalol  20 mg Intravenous Q6H  . lip balm  1 application Topical BID  . mouth rinse  15 mL Mouth Rinse q12n4p  . sodium chloride flush  5 mL Intracatheter Q8H    Objective: Vital signs in last 24 hours: Temp:  [97.4 F (36.3 C)-100.2 F (37.9 C)] 99.4 F (37.4 C) (05/20 1100) Pulse Rate:   [66-101] 71 (05/20 1100) Resp:  [19-25] 20 (05/20 1200) BP: (94-149)/(43-104) 148/76 (05/20 1200) SpO2:  [84 %-100 %] 100 % (05/20 1100) Physical Exam  Constitutional: He is oriented to person,. He appears chronically ill and well-nourished. No distress.  HENT:  Mouth/Throat: Oropharynx is clear and dry. Exudate on tongue Cardiovascular: Normal rate, regular rhythm and normal heart sounds. Exam reveals no gallop and no friction rub.  No murmur heard.  Pulmonary/Chest: Effort normal and breath sounds normal. No respiratory distress. He has no wheezes.  Abdominal: Soft. Bowel sounds are decreased. Mildly distension. Ostomy working. RUQ has trace serous fluid in bulb. Bandaged lower extremities Ext: anasarca, pitting edema on arms and legs Neurological: He is alert and oriented to person, Skin: RUQ c/d/i central line, onychomycosis thickened nailbeds bilaterally Psychiatric: flat affect    Lab Results Recent Labs    05/29/17 0420 05/30/17 0344 05/30/17 0637  WBC 25.9* 25.3*  --   HGB 9.2* 8.9*  --   HCT 29.3* 30.8*  --   NA 140  --  140  K 4.1  --  4.3  CL 111  --  107  CO2 22  --  24  BUN 27*  --  29*  CREATININE 1.15  --  1.36*   Liver Panel Recent Labs    05/30/17 0637  PROT 5.3*  ALBUMIN 1.2*  AST  47*  ALT 66*  ALKPHOS 269*  BILITOT 1.4*   Microbiology: 5/13 blood cx  5/16 aspirate - polymicrobial on gram stain. Few PsA on culture Studies/Results: No results found.   Assessment/Plan: Fevers and leukocytosis = unclear if this is all related to retroperitoneal bleed ,still suspicion it is from intra-abdominal process   - recommend repeat blood cx if temp>100.3 - recommend to see if can do line holiday vs. Changing out central line to different appendage - would keep on piptazo for now but discontinue vancomycin since he does not have hx of MRSA and also having some aki  PsA intra-abd abscess = being treated with piptazo   [facility]  for Infectious Diseases Cell: [phone] Pager: [phone]  05/30/2017, 2:06 PM

## 2017-05-30 NOTE — Progress Notes (Signed)
[facility]  Chart reviewed [facility] has been involved, signed off on 5/20. Please let us know if we can be of assistance  Roselie Awkward, MD [facility] Pager: [phone] Cell: [phone] After 3pm or if no response, call [phone]

## 2017-05-30 NOTE — Progress Notes (Signed)
Nutrition Follow-up  INTERVENTION:   Recommend decreasing TPN to help with appetite  Encourage PO intake at meals and oral nutrition supplements  Continue Ensure Enlive po TID, each supplement provides 350 kcal and 20 grams of protein  NUTRITION DIAGNOSIS:   Increased nutrient needs related to (cancer) as evidenced by estimated needs. Ongoing.   GOAL:   Patient will meet greater than or equal to 90% of their needs Progressing.   MONITOR:   Diet advancement, I & O's  ASSESSMENT:   72 year old male here with iron deficiency anemia with recent rectal bleeding found to have abnormal CT scan of the abdomen and pelvis suggestive of a sigmoid cancer.  He has a history of peripheral vascular disease, aortic aneurysm with endovascular repair, CAD, hypertension, hyperlipidemia, COPD, CKD.  Diet advancement:  5/17 clear liquids 5/19 soft diet, did not want to eat 5/20 back to full liquids, MD ordered ensure enlive TID  Spoke with pt and RN. Pt did not make eye contact. Explained importance of increasing PO intake and drinking oral nutrition supplements. Pt reports no appetite but also no nausea today. Per RN pt did not want anything to eat for breakfast and only got him to drink 2 sips of ensure. Encouragement provided to pt.   Weight is up 74 lb since admission, per I/O records pt is positive 15 L, pt with deep pitting edema in all extremities  JP drain minimal, IR following  Medications reviewed and include: SSI Labs reviewed: Alk phos 269 (H), AST/ALT: 47/66 (H)   TPN continues @ 70 ml/hr providing 2242 kcal, 116 grams protein, and additional 500 mg Vitamin C and 5 mg Zinc  Diet Order:   Diet Order           Diet full liquid Room service appropriate? Yes; Fluid consistency: Thin  Diet effective now          EDUCATION NEEDS:   Not appropriate for education at this time  Skin:  Skin Assessment: Skin Integrity Issues: Skin Integrity Issues:: Incisions Incisions: closed  abdomen  Last BM:  475 ml via ileostomy x 24 hr  Height:   Ht Readings from Last 1 Encounters:  05/11/17 6' 0.99" (1.854 m)    Weight:   Wt Readings from Last 1 Encounters:  05/29/17 282 lb 13.6 oz (128.3 kg)    Ideal Body Weight:  83.6 kg  BMI:  Body mass index is 37.33 kg/m.  Estimated Nutritional Needs:   Kcal:  2100-2400  Protein:  105-120 grams  Fluid:  2.1 - 2.4 L/day  Maylon Peppers RD, LDN, CNSC [phone] Pager [phone] After Hours Pager

## 2017-05-30 NOTE — Progress Notes (Signed)
     Supervising Physician: Marybelle Killings  Patient Status:  [facility] - In-pt  Chief Complaint: Peri-hepatic fluid collection  Subjective: Alert, less active. WBC elevated.  Allergies: Patient has no known allergies.  Medications: Prior to Admission medications   Medication Sig Start Date End Date Taking? Authorizing Provider  aspirin EC 81 MG tablet Take 81 mg by mouth daily.   Yes [provider]  metFORMIN (GLUCOPHAGE) 500 MG tablet Take 1 tablet (500 mg total) by mouth 2 (two) times daily with a meal. 10/13/12  Yes Charolette Forward, MD  metoprolol tartrate (LOPRESSOR) 50 MG tablet Take 50 mg by mouth 2 (two) times daily.   Yes [provider]  glimepiride (AMARYL) 2 MG tablet Take 1 tablet (2 mg total) by mouth daily with breakfast. Patient not taking: Reported on 05/06/2017 10/13/12   Charolette Forward, MD     Vital Signs: BP (!) 148/76   Pulse 71   Temp 99.4 F (37.4 C)   Resp 20   Ht 6' 0.99" (1.854 m)   Wt 282 lb 13.6 oz (128.3 kg)   SpO2 100%   BMI 37.33 kg/m   Physical Exam  NAD, alert, confused Abdomen:  RUQ drain in place.  Insertion site c/d/i.  Small amount of serous output in bulb, only 5 mL yesterday.  Imaging: No results found.  Labs:  CBC: Recent Labs    05/27/17 0423 05/28/17 0326 05/29/17 0420 05/30/17 0344  WBC 20.0* 21.8* 25.9* 25.3*  HGB 9.1* 8.6* 9.2* 8.9*  HCT 28.3* 26.9* 29.3* 30.8*  PLT 320 314 300 268    COAGS: Recent Labs    05/19/17 0050 05/25/17 1638  INR 1.60 1.28    BMP: Recent Labs    05/27/17 0423 05/28/17 0326 05/29/17 0420 05/30/17 0637  NA 140 142 140 140  K 3.5 3.6 4.1 4.3  CL 111 111 111 107  CO2 23 24 22 24   GLUCOSE 182* 190* 114* 162*  BUN 24* 26* 27* 29*  CALCIUM 7.2* 7.5* 7.2* 7.3*  CREATININE 0.95 1.05 1.15 1.36*  GFRNONAA >60 >60 >60 50*  GFRAA >60 >60 >60 58*    LIVER FUNCTION TESTS: Recent Labs    05/20/17 0348 05/23/17 0356 05/26/17 0426 05/30/17 0637  BILITOT 1.1 0.6 0.8  1.4*  AST 85* 34 51* 47*  ALT 50 20 38 66*  ALKPHOS 76 56 110 269*  PROT 4.8* 4.2* 4.7* 5.3*  ALBUMIN 1.2* <1.0* 1.2* 1.2*    Assessment and Plan: Perihepatic fluid collection  Patient s/p peri-hepatic fluid aspiration and drain placement. Output remains small amount, still serous drainage. Culture was positive for pseudomonas. WBC remains elevated.  Keep drain for now. IR to follow.  Electronically Signed: Docia Barrier, PA 05/30/2017, 2:17 PM   I spent a total of 15 Minutes at the the patient's bedside AND on the patient's hospital floor or unit, greater than 50% of which was counseling/coordinating care for peri-hepatic fluid collection.

## 2017-05-30 NOTE — Consult Note (Addendum)
[place] Nurse ostomy follow up Surgical team following for assessment and plan of care for abd wound.  Stoma type/location:Ileostomy surgery performed on 5/1 Stoma red and viable, 1 inch, above skin level.There is mucocutaneous separation to outer stoma from 2:00 o'clock to 5:00 o'clock, with depth of .3cm, red and moist; applied barrier ring to assist with protecting site and topromotehealing. Output:mod amtliquid green stool Ostomy pouching:2pc.  Education provided:Pt is in ICU; no family present for teaching session.He did not watch the pouch change procedure or ask questions. Supplies at bedside for staff nurse use, educational materials left in the room. Ptwill not be able to change or empty pouch without total assistance after discharge. Enrolled patient in [place] Discharge program:Yes [place] team will continue to follow while in the hospital for further teaching sessionswhen stable and out of ICU. Julien Girt MSN, RN, [place], [place], [place]

## 2017-05-30 NOTE — Progress Notes (Signed)
[facility]S/[facility] Progress Note 19 Days Post-Op  Subjective: Patient not verbal at all.  No acute distress.  Did not want to eat soft diet this AM  Objective: Vital signs in last 24 hours: Temp:  [97.4 F (36.3 C)-100.2 F (37.9 C)] 99.4 F (37.4 C) (05/20 0700) Pulse Rate:  [60-101] 71 (05/20 0800) Resp:  [19-25] 19 (05/20 0800) BP: (94-149)/(43-104) 143/69 (05/20 0800) SpO2:  [84 %-100 %] 100 % (05/20 0800) Arterial Line BP: (143-184)/(46-72) 184/72 (05/19 1300) Last BM Date: 05/29/17(from ileostomy)  Intake/Output from previous day: 05/19 0701 - 05/20 0700 In: 2181.2 [P.O.:100; I.V.:1581.2; IV Piggyback:500] Out: 2600 [Urine:2120; Drains:5; Stool:475] Intake/Output this shift: Total I/O In: -  Out: 150 [Urine:150]  General: No acute distress.  Lungs: Clear  Abd: Hypoactive bowel sounds.  Debrided necrotic fascia at the bedside.  Also cut some sutures and removed them.  Extremities: No changed  Neuro: Intact  Lab Results:  @LABLAST2 (wbc:2,hgb:2,hct:2,plt:2) BMET ) Recent Labs    05/29/17 0420 05/30/17 0637  NA 140 140  K 4.1 4.3  CL 111 107  CO2 22 24  GLUCOSE 114* 162*  BUN 27* 29*  CREATININE 1.15 1.36*  CALCIUM 7.2* 7.3*   PT/INR No results for input(s): LABPROT, INR in the last 72 hours. ABG No results for input(s): PHART, HCO3 in the last 72 hours.  Invalid input(s): PCO2, PO2  Studies/Results: No results found.  Anti-infectives: Anti-infectives (From admission, onward)   Start     Dose/Rate Route Frequency Ordered Stop   05/28/17 1200  vancomycin (VANCOCIN) 1,250 mg in sodium chloride 0.9 % 250 mL IVPB     1,250 mg 166.7 mL/hr over 90 Minutes Intravenous Every 24 hours 05/27/17 1324     05/27/17 1100  vancomycin (VANCOCIN) 1,250 mg in sodium chloride 0.9 % 250 mL IVPB  Status:  Discontinued     1,250 mg 166.7 mL/hr over 90 Minutes Intravenous Every 12 hours 05/27/17 1011 05/27/17 1324   05/20/17 2230  vancomycin (VANCOCIN) 1,250 mg in sodium  chloride 0.9 % 250 mL IVPB  Status:  Discontinued     1,250 mg 166.7 mL/hr over 90 Minutes Intravenous Every 24 hours 05/20/17 2151 05/27/17 1011   05/20/17 1400  piperacillin-tazobactam (ZOSYN) IVPB 3.375 g     3.375 g 12.5 mL/hr over 240 Minutes Intravenous Every 8 hours 05/20/17 1044     05/20/17 0500  anidulafungin (ERAXIS) 100 mg in sodium chloride 0.9 % 100 mL IVPB  Status:  Discontinued     100 mg 78 mL/hr over 100 Minutes Intravenous Every 24 hours 05/19/17 0116 05/29/17 1214   05/19/17 2200  vancomycin (VANCOCIN) 1,500 mg in sodium chloride 0.9 % 500 mL IVPB  Status:  Discontinued     1,500 mg 250 mL/hr over 120 Minutes Intravenous Every 24 hours 05/18/17 2126 05/20/17 2150   05/19/17 0130  anidulafungin (ERAXIS) 200 mg in sodium chloride 0.9 % 200 mL IVPB     200 mg 78 mL/hr over 200 Minutes Intravenous  Once 05/19/17 0116 05/19/17 0520   05/19/17 0130  Ampicillin-Sulbactam (UNASYN) 3 g in sodium chloride 0.9 % 100 mL IVPB  Status:  Discontinued     3 g 200 mL/hr over 30 Minutes Intravenous Every 8 hours 05/19/17 0118 05/20/17 1044   05/18/17 2200  vancomycin (VANCOCIN) 1,750 mg in sodium chloride 0.9 % 500 mL IVPB     1,750 mg 250 mL/hr over 120 Minutes Intravenous  Once 05/18/17 2126 05/19/17 0400   05/17/17 0930  piperacillin-tazobactam (  ZOSYN) IVPB 3.375 g  Status:  Discontinued     3.375 g 12.5 mL/hr over 240 Minutes Intravenous Every 8 hours 05/17/17 0836 05/19/17 0118   05/11/17 2200  cefoTEtan (CEFOTAN) 2 g in sodium chloride 0.9 % 100 mL IVPB     2 g 200 mL/hr over 30 Minutes Intravenous Every 12 hours 05/11/17 1839 05/11/17 2034   05/11/17 0600  cefoTEtan in Dextrose 5% (CEFOTAN) IVPB 2 g     2 g Intravenous On call to O.R. 05/10/17 1142 05/11/17 1416      Assessment/Plan: s/p Procedure(s): Colon Resection Sigmoid Low Anterior Resection with Staple Colon Relast HERNIA REPAIR UMBILICAL ADULT Creation Vertical Loop ILEOSTOMY Back off to full liquid diet.  LOS:  24 days   Kathryne Eriksson. Dahlia Bailiff, MD, FACS [phone] [facility] Surgery 05/30/2017

## 2017-05-31 ENCOUNTER — Inpatient Hospital Stay: Payer: Medicare Other | Admitting: Hematology

## 2017-05-31 LAB — CBC
HCT: 32.4 % — ABNORMAL LOW (ref 39.0–52.0)
Hemoglobin: 9.1 g/dL — ABNORMAL LOW (ref 13.0–17.0)
MCH: 29.1 pg (ref 26.0–34.0)
MCHC: 28.1 g/dL — AB (ref 30.0–36.0)
MCV: 103.5 fL — AB (ref 78.0–100.0)
PLATELETS: 247 10*3/uL (ref 150–400)
RBC: 3.13 MIL/uL — ABNORMAL LOW (ref 4.22–5.81)
RDW: 28.5 % — ABNORMAL HIGH (ref 11.5–15.5)
WBC: 19.7 10*3/uL — AB (ref 4.0–10.5)

## 2017-05-31 LAB — BASIC METABOLIC PANEL
Anion gap: 8 (ref 5–15)
BUN: 27 mg/dL — ABNORMAL HIGH (ref 6–20)
CALCIUM: 7.8 mg/dL — AB (ref 8.9–10.3)
CHLORIDE: 110 mmol/L (ref 101–111)
CO2: 24 mmol/L (ref 22–32)
CREATININE: 1.22 mg/dL (ref 0.61–1.24)
GFR calc Af Amer: >60 mL/min (ref >60)
GFR calc non Af Amer: 57 mL/min — ABNORMAL LOW (ref >60)
GLUCOSE: 131 mg/dL — AB (ref 65–99)
Potassium: 4.3 mmol/L (ref 3.5–5.1)
Sodium: 142 mmol/L (ref 135–145)

## 2017-05-31 LAB — AEROBIC/ANAEROBIC CULTURE W GRAM STAIN (SURGICAL/DEEP WOUND): Special Requests: NORMAL

## 2017-05-31 LAB — AEROBIC/ANAEROBIC CULTURE (SURGICAL/DEEP WOUND)

## 2017-05-31 LAB — GLUCOSE, CAPILLARY
GLUCOSE-CAPILLARY: 173 mg/dL — AB (ref 65–99)
GLUCOSE-CAPILLARY: 117 mg/dL — AB (ref 65–99)
GLUCOSE-CAPILLARY: 187 mg/dL — AB (ref 65–99)
Glucose-Capillary: 185 mg/dL — ABNORMAL HIGH (ref 65–99)
Glucose-Capillary: 181 mg/dL — ABNORMAL HIGH (ref 65–99)

## 2017-05-31 LAB — MAGNESIUM: Magnesium: 2 mg/dL (ref 1.7–2.4)

## 2017-05-31 LAB — PHOSPHORUS: Phosphorus: 3.3 mg/dL (ref 2.5–4.6)

## 2017-05-31 MED ORDER — ZINC TRACE METAL 1 MG/ML IV SOLN
INTRAVENOUS | Status: AC
  Administered 2017-05-31: 19:00:00 via INTRAVENOUS
  Filled 2017-05-31 (×2): qty 772.8

## 2017-05-31 MED ORDER — HALOPERIDOL LACTATE 5 MG/ML IJ SOLN: 1.0000 mg | INTRAMUSCULAR | Status: DC | PRN

## 2017-05-31 NOTE — Progress Notes (Signed)
CCS/Jesse Le Progress Note 20 Days Post-Op  Subjective: Patient still not eating.  Will need a Cortrak  Objective: Vital signs in last 24 hours: Temp:  [97.6 F (36.4 C)-99.5 F (37.5 C)] 97.6 F (36.4 C) (05/21 0400) Pulse Rate:  [71-88] 88 (05/20 2200) Resp:  [17-25] 22 (05/21 0700) BP: (127-158)/(62-123) 141/70 (05/21 0700) SpO2:  [100 %] 100 % (05/21 0700) Last BM Date: 05/30/17(ileostomy output)  Intake/Output from previous day: 05/20 0701 - 05/21 0700 In: 2089.3 [I.V.:1679.3; IV Piggyback:400] Out: 2292 [Urine:1960; Drains:7; Stool:325] Intake/Output this shift: Total I/O In: -  Out: 55 [Urine:55]  General: No distress,   Lungs: Clear  Abd: Distended, but hypoactive bowel sounds.  Wound unchanged.  Will look at it again tomorrow.  Extremities: No changes  Neuro: Slow to respond.  Lab Results:  @LABLAST2 (wbc:2,hgb:2,hct:2,plt:2) BMET ) Recent Labs    05/30/17 0637 05/31/17 0720  NA 140 142  K 4.3 4.3  CL 107 110  CO2 24 24  GLUCOSE 162* 131*  BUN 29* 27*  CREATININE 1.36* 1.22  CALCIUM 7.3* 7.8*   PT/INR No results for input(s): LABPROT, INR in the last 72 hours. ABG No results for input(s): PHART, HCO3 in the last 72 hours.  Invalid input(s): PCO2, PO2  Studies/Results: No results found.  Anti-infectives: Anti-infectives (From admission, onward)   Start     Dose/Rate Route Frequency Ordered Stop   05/28/17 1200  vancomycin (VANCOCIN) 1,250 mg in sodium chloride 0.9 % 250 mL IVPB  Status:  Discontinued     1,250 mg 166.7 mL/hr over 90 Minutes Intravenous Every 24 hours 05/27/17 1324 05/30/17 1615   05/27/17 1100  vancomycin (VANCOCIN) 1,250 mg in sodium chloride 0.9 % 250 mL IVPB  Status:  Discontinued     1,250 mg 166.7 mL/hr over 90 Minutes Intravenous Every 12 hours 05/27/17 1011 05/27/17 1324   05/20/17 2230  vancomycin (VANCOCIN) 1,250 mg in sodium chloride 0.9 % 250 mL IVPB  Status:  Discontinued     1,250 mg 166.7 mL/hr over 90  Minutes Intravenous Every 24 hours 05/20/17 2151 05/27/17 1011   05/20/17 1400  piperacillin-tazobactam (ZOSYN) IVPB 3.375 g     3.375 g 12.5 mL/hr over 240 Minutes Intravenous Every 8 hours 05/20/17 1044     05/20/17 0500  anidulafungin (ERAXIS) 100 mg in sodium chloride 0.9 % 100 mL IVPB  Status:  Discontinued     100 mg 78 mL/hr over 100 Minutes Intravenous Every 24 hours 05/19/17 0116 05/29/17 1214   05/19/17 2200  vancomycin (VANCOCIN) 1,500 mg in sodium chloride 0.9 % 500 mL IVPB  Status:  Discontinued     1,500 mg 250 mL/hr over 120 Minutes Intravenous Every 24 hours 05/18/17 2126 05/20/17 2150   05/19/17 0130  anidulafungin (ERAXIS) 200 mg in sodium chloride 0.9 % 200 mL IVPB     200 mg 78 mL/hr over 200 Minutes Intravenous  Once 05/19/17 0116 05/19/17 0520   05/19/17 0130  Ampicillin-Sulbactam (UNASYN) 3 g in sodium chloride 0.9 % 100 mL IVPB  Status:  Discontinued     3 g 200 mL/hr over 30 Minutes Intravenous Every 8 hours 05/19/17 0118 05/20/17 1044   05/18/17 2200  vancomycin (VANCOCIN) 1,750 mg in sodium chloride 0.9 % 500 mL IVPB     1,750 mg 250 mL/hr over 120 Minutes Intravenous  Once 05/18/17 2126 05/19/17 0400   05/17/17 0930  piperacillin-tazobactam (ZOSYN) IVPB 3.375 g  Status:  Discontinued     3.375 g 12.5 mL/hr  over 240 Minutes Intravenous Every 8 hours 05/17/17 0836 05/19/17 0118   05/11/17 2200  cefoTEtan (CEFOTAN) 2 g in sodium chloride 0.9 % 100 mL IVPB     2 g 200 mL/hr over 30 Minutes Intravenous Every 12 hours 05/11/17 1839 05/11/17 2034   05/11/17 0600  cefoTEtan in Dextrose 5% (CEFOTAN) IVPB 2 g     2 g Intravenous On call to O.R. 05/10/17 1142 05/11/17 1416      Assessment/Plan: s/p Procedure(s): Colon Resection Sigmoid Low Anterior Resection with Staple Colon Relast HERNIA REPAIR UMBILICAL ADULT Creation Vertical Loop ILEOSTOMY Cortrak for nutrition and meds.  LOS: 25 days   Kathryne Eriksson. Dahlia Bailiff, MD,  FACS [phone] [facility] Surgery 05/31/2017

## 2017-05-31 NOTE — Plan of Care (Signed)
  Problem: Clinical Measurements: Goal: Respiratory complications will improve Outcome: Progressing Goal: Cardiovascular complication will be avoided Outcome: Progressing   Problem: Nutrition: Goal: Adequate nutrition will be maintained Outcome: Progressing   Problem: Elimination: Goal: Will not experience complications related to urinary retention Outcome: Progressing Note:  Patient's Foley d/c and patient is urinating in condom cath.

## 2017-05-31 NOTE — Progress Notes (Signed)
PHARMACY - ADULT TOTAL PARENTERAL NUTRITION CONSULT NOTE   Pharmacy Consult:  TPN Indication: Prolonged ileus  Patient Measurements: Height: 6' 0.99" (185.4 cm) Weight: 282 lb 13.6 oz (128.3 kg) IBW/kg (Calculated) : 79.88 TPN AdjBW (KG): 94.5 Body mass index is 37.33 kg/m.  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  Developed anastomotic leak with feculent output in JP drain. Patient currently with high ileostomy output. Patient's abdomen has become more distended and patient was made NPO. Pharmacy consulted for TPN.   Patient was on clear liquids (on and off NPO as well) but only tolerating minimal intake (<25%) for >10 days. Patient is at risk for refeeding.   GI: abd wound dehiscence. Prealbumin <5 - now up to 21.7, 300 ml ileostomy O/P, drain O/P 1mL. Pepcid in TPN. LBM 5/20. Tolerated clears 5/19 > advanced to soft diet with plans to continue TPN until reliably tolerating. Backed off to full liquids again 5/20, as pt does not want to eat. To place Cortrak Endo: no hx DM - hypoglycemic prior to TPN. CBGs controlled. Off steroids  Insulin requirements in the past 24 hours: 12 units SSI + 15 units in TPN Lytes: all WNL, K stable 4.3 after decreasing in TPN 5/20 w/ AKI, Phos down 3.8>3.3 Renal: SCr back down to 1.22, BUN down to 27 - UOP 0.6 ml/kg/hr, +18.7 L since admit Pulm: [place] Cards: BP wnl/slightly high, HR ok - off nicardipine gtt, IV labetalol scheduled, PRN hydralazine. Marked volume overload per Cards 5/17. Hepatobil: LFTs now mildly elevated / tbili up to 1.4 / TG WNL Heme: CT shows spontaneous RTP hematoma - hgb up to 9.1, plts WNL Neuro: Precedex gtt now off, PRN Dilaudid/Benadryl/Haldol - CPOT 0 - still confused ID: Vanc for  peritonitis. Perihepatic fluid drained and cx sent - growing pan-sensitive Pseudomonas. ID stopped Eraxis and Vanc. Afebrile, WBC down to 19.7 TPN Access: CVC triple lumen placed 05/18/17 TPN start date: 05/20/17  Nutritional Goals (per RD rec on 5/9): 2100-2400 kCal, 105-120g protein, 2.1- 2.4 L per day  Current Nutrition:  TPN Soft diet > back to full liquid 5/20 Ensure Enlive TID - each supplement provides 350kCal and 20g protein. 2 doses charted yesterday but taking in "only a few sips" per RD note   Plan:  Continue concentrated TPN at 70 ml/hr, providing 116g AA, 370g CHO and 52g ILE for a total of 2242 kCal, meeting 100% of patient's needs. Electrolytes in TPN: reduce K 5/20 with SCr bump, increase Mag slightly 5/20; Increase phos slightly 5/21; Cl:Ac 1:1 Daily multivitamin and trace elements in TPN Continue SSI moderate Q4H + regular insulin 15 units added to TPN and monitor CBGs Pepcid 40mg  daily in TPN Vitamin C 500mg  and Zinc 5mg  added to TPN to help with wound healing (started 5/18) F/U Cortak placement and tube feed start for ability to wean TPN  Elicia Lamp, PharmD, BCPS Clinical Pharmacist Clinical phone for 05/31/2017 until 3:30pm: [phone] If after 3:30pm, please call main pharmacy at: [phone] 05/31/2017 11:14 AM

## 2017-05-31 NOTE — Progress Notes (Addendum)
[place] for Infectious Disease    Date of Admission:  05/06/2017   Total days of antibiotics 15        Day 14 vanco(stopped on 5/20 pm)        Day 15 piptazo           ID: Jesse Le is a 72 y.o. 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  Developed anastomotic leak with feculent output in JP drain. 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 Remains afebrile x 24hrs.   Wbc down in comparison to yesterday  Medications:  . chlorhexidine  15 mL Mouth Rinse BID  . Chlorhexidine Gluconate Cloth  6 each Topical Daily  . feeding supplement (ENSURE ENLIVE)  237 mL Oral TID BM  . insulin aspart  0-15 Units Subcutaneous Q4H  . lip balm  1 application Topical BID  . mouth rinse  15 mL Mouth Rinse q12n4p  . metoprolol tartrate  25 mg Oral BID  . sodium chloride flush  5 mL Intracatheter Q8H    Objective: Vital signs in last 24 hours: Temp:  [97.6 F (36.4 C)-99.5 F (37.5 C)] 98.1 F (36.7 C) (05/21 1155) Pulse Rate:  [72-88] 88  (05/20 2200) Resp:  [17-25] 22 (05/21 0700) BP: (127-158)/(63-123) 141/70 (05/21 0700) SpO2:  [100 %] 100 % (05/21 0700)  Constitutional: He is oriented to person,. He appears chronically ill and well-nourished. No distress. Sitting up in bed HENT:  Mouth/Throat: Oropharynx is clear and dry. MMM Cardiovascular: nl s1,s2,no g/m/r Pulmonary/Chest: cTAB no w/c/r Abdominal: Soft. Bowel sounds are decreased. Mildly distension. Ostomy working. RUQ has trace serous fluid in bulb. Bandaged lower extremities Ext: anasarca, pitting edema on arms and legs Neurological: He is alert and oriented to person, Skin: RUQ c/d/i central line, onychomycosis thickened nailbeds bilaterally   Lab Results Recent Labs    05/30/17 0344 05/30/17 0637 05/31/17 0428 05/31/17 0720  WBC 25.3*  --  19.7*  --   HGB 8.9*  --  9.1*  --   HCT 30.8*  --  32.4*  --   NA  --  140  --  142  K  --  4.3  --  4.3  CL  --  107  --  110  CO2  --  24  --  24  BUN  --  29*  --  27*  CREATININE  --  1.36*  --  1.22   Liver Panel Recent Labs    05/30/17 0637  PROT 5.3*  ALBUMIN 1.2*  AST 47*  ALT 66*  ALKPHOS 269*  BILITOT 1.4*   Microbiology: 5/13 blood cx  5/16 aspirate - polymicrobial on gram stain. Few PsA on culture Studies/Results: No results found.   Assessment/Plan: Fevers and leukocytosis =  suspicion it is from intra-abdominal process vs retroperitoneal bleed  - continue on piptazo for now. Recommend 7 more days unless status change - if he has recurrence of fever, would repeat blood cx plus r ecommend to see if can do line holiday vs. Changing out central line to different appendage  PsA intra-abd abscess = being treated with piptazo   [facility] Cell: [phone] Pager: [phone]  05/31/2017, 12:15 PM

## 2017-05-31 NOTE — Progress Notes (Signed)
PT Cancellation Note  Patient Details Name: Jesse Le MRN: [id number] DOB: [birth year]   Cancelled Treatment:    Reason Eval/Treat Not Completed: Active bedrest order. Per RN pt now on bedrest until abdomen heals due to incision being open. PT order cancelled. Please re-consult when appropriate in future. Thank you.  Kittie Plater, PT, DPT Pager #: [phone] Office #: [phone]    [name] 05/31/2017, 9:51 AM

## 2017-05-31 NOTE — Progress Notes (Signed)
Subjective:  More awake denies any complaints. Patient encouraged for by mouth intake.  Objective:  Vital Signs in the last 24 hours: Temp:  [97.6 F (36.4 C)-99.5 F (37.5 C)] 97.6 F (36.4 C) (05/21 0400) Pulse Rate:  [71-88] 88 (05/20 2200) Resp:  [17-25] 22 (05/21 0700) BP: (127-158)/(63-123) 141/70 (05/21 0700) SpO2:  [100 %] 100 % (05/21 0700)  Intake/Output from previous day: 05/20 0701 - 05/21 0700 In: 2089.3 [I.V.:1679.3; IV Piggyback:400] Out: 2292 [Urine:1960; Drains:7; Stool:325] Intake/Output from this shift: Total I/O In: -  Out: 55 [Urine:55]  Physical Exam: Neck: no adenopathy, no carotid bruit, no JVD and supple, symmetrical, trachea midline Lungs: Clear to auscultation anterolaterally Heart: regular rate and rhythm, S1, S2 normal and Soft systolic murmur noted Abdomen: Soft list distended faint bowel sounds surgical dressing dry ileostomy site okay Extremities: Generalized swelling of upper and lower extremities noted  Lab Results: Recent Labs    05/30/17 0344 05/31/17 0428  WBC 25.3* 19.7*  HGB 8.9* 9.1*  PLT 268 247   Recent Labs    05/30/17 0637 05/31/17 0720  NA 140 142  K 4.3 4.3  CL 107 110  CO2 24 24  GLUCOSE 162* 131*  BUN 29* 27*  CREATININE 1.36* 1.22   No results for input(s): TROPONINI in the last 72 hours.  Invalid input(s): CK, MB Hepatic Function Panel Recent Labs    05/30/17 0637  PROT 5.3*  ALBUMIN 1.2*  AST 47*  ALT 66*  ALKPHOS 269*  BILITOT 1.4*   No results for input(s): CHOL in the last 72 hours. No results for input(s): PROTIME in the last 72 hours.  Imaging: Imaging results have been reviewed and No results found.  Cardiac Studies:  Assessment/Plan:  Rectosigmoid adenocarcinoma status post resection with primary colorectal anastomosis and dilating loop ileostomy and umbilical hernia repair Spontaneous retroperitoneal hematoma Abdominalsurgical wound infection Metabolic encephalopathy Status  postSeptichypotensive shock secondary Intra-abdominal sepsis/possible abscess. 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

## 2017-05-31 NOTE — Progress Notes (Signed)
OT Cancellation Note  Patient Details Name: Jesse Le MRN: [id number] DOB: Sep 26, 1945   Cancelled Treatment:    Reason Eval/Treat Not Completed: Medical issues which prohibited therapy;Other (comment) (Will hold therapies per RN request. Per RN, MD wanting bedrest for stomach to heal. Will return as pt medically ready. Thank you.)  [name], OTR/L Acute Rehab Pager: [phone] Office: [phone] 05/31/2017, 9:45 AM

## 2017-06-01 ENCOUNTER — Inpatient Hospital Stay (HOSPITAL_COMMUNITY): Payer: Medicare Other

## 2017-06-01 DIAGNOSIS — J9601 Acute respiratory failure with hypoxia: Secondary | ICD-10-CM

## 2017-06-01 DIAGNOSIS — J9811 Atelectasis: Secondary | ICD-10-CM

## 2017-06-01 DIAGNOSIS — J189 Pneumonia, unspecified organism: Secondary | ICD-10-CM

## 2017-06-01 LAB — URINALYSIS, ROUTINE W REFLEX MICROSCOPIC
Bilirubin Urine: NEGATIVE
Glucose, UA: NEGATIVE mg/dL
HGB URINE DIPSTICK: NEGATIVE
KETONES UR: NEGATIVE mg/dL
Nitrite: NEGATIVE
PROTEIN: 30 mg/dL — AB
Specific Gravity, Urine: 1.016 (ref 1.005–1.030)
pH: 5 (ref 5.0–8.0)

## 2017-06-01 LAB — POCT I-STAT 3, ART BLOOD GAS (G3+)
ACID-BASE DEFICIT: 2 mmol/L (ref 0.0–2.0)
Acid-base deficit: 4 mmol/L — ABNORMAL HIGH (ref 0.0–2.0)
BICARBONATE: 24.1 mmol/L (ref 20.0–28.0)
Bicarbonate: 20 mmol/L (ref 20.0–28.0)
O2 Saturation: 96 %
O2 Saturation: 97 %
PCO2 ART: 31.4 mmHg — AB (ref 32.0–48.0)
PH ART: 7.412 (ref 7.350–7.450)
TCO2: 21 mmol/L — ABNORMAL LOW (ref 22–32)
TCO2: 26 mmol/L (ref 22–32)
pCO2 arterial: 44.7 mmHg (ref 32.0–48.0)
pH, Arterial: 7.338 — ABNORMAL LOW (ref 7.350–7.450)
pO2, Arterial: 77 mmHg — ABNORMAL LOW (ref 83.0–108.0)
pO2, Arterial: 96 mmHg (ref 83.0–108.0)

## 2017-06-01 LAB — BASIC METABOLIC PANEL
Anion gap: 9 (ref 5–15)
BUN: 27 mg/dL — ABNORMAL HIGH (ref 6–20)
CALCIUM: 7.7 mg/dL — AB (ref 8.9–10.3)
CO2: 22 mmol/L (ref 22–32)
CREATININE: 1.32 mg/dL — AB (ref 0.61–1.24)
Chloride: 112 mmol/L — ABNORMAL HIGH (ref 101–111)
GFR, EST NON AFRICAN AMERICAN: 52 mL/min — AB (ref >60)
Glucose, Bld: 206 mg/dL — ABNORMAL HIGH (ref 65–99)
Potassium: 4.1 mmol/L (ref 3.5–5.1)
SODIUM: 143 mmol/L (ref 135–145)

## 2017-06-01 LAB — GLUCOSE, CAPILLARY
Glucose-Capillary: 138 mg/dL — ABNORMAL HIGH (ref 65–99)
Glucose-Capillary: 158 mg/dL — ABNORMAL HIGH (ref 65–99)
Glucose-Capillary: 163 mg/dL — ABNORMAL HIGH (ref 65–99)
Glucose-Capillary: 183 mg/dL — ABNORMAL HIGH (ref 65–99)
Glucose-Capillary: 202 mg/dL — ABNORMAL HIGH (ref 65–99)
Glucose-Capillary: 88 mg/dL (ref 65–99)

## 2017-06-01 LAB — POCT I-STAT 3, VENOUS BLOOD GAS (G3P V)
ACID-BASE DEFICIT: 4 mmol/L — AB (ref 0.0–2.0)
BICARBONATE: 23 mmol/L (ref 20.0–28.0)
O2 Saturation: 73 %
TCO2: 25 mmol/L (ref 22–32)
pCO2, Ven: 52.2 mmHg (ref 44.0–60.0)
pH, Ven: 7.252 (ref 7.250–7.430)
pO2, Ven: 45 mmHg (ref 32.0–45.0)

## 2017-06-01 LAB — MAGNESIUM: MAGNESIUM: 2.1 mg/dL (ref 1.7–2.4)

## 2017-06-01 LAB — PHOSPHORUS: PHOSPHORUS: 3.1 mg/dL (ref 2.5–4.6)

## 2017-06-01 MED ORDER — FUROSEMIDE 10 MG/ML IJ SOLN
INTRAMUSCULAR | Status: AC
  Filled 2017-06-01: qty 4

## 2017-06-01 MED ORDER — INSULIN ASPART 100 UNIT/ML ~~LOC~~ SOLN
3.0000 [IU] | SUBCUTANEOUS | Status: DC
  Administered 2017-06-01: 6 [IU] via SUBCUTANEOUS

## 2017-06-01 MED ORDER — ORAL CARE MOUTH RINSE
15.0000 mL | Freq: Four times a day (QID) | OROMUCOSAL | Status: DC
  Administered 2017-06-01 (×2): 15 mL via OROMUCOSAL

## 2017-06-01 MED ORDER — HEPARIN (PORCINE) IN NACL 100-0.45 UNIT/ML-% IJ SOLN: 1600.0000 [IU]/h | INTRAMUSCULAR | Status: DC

## 2017-06-01 MED ORDER — DEXMEDETOMIDINE HCL IN NACL 200 MCG/50ML IV SOLN
0.0000 ug/kg/h | INTRAVENOUS | Status: DC
  Administered 2017-06-01: 0.4 ug/kg/h via INTRAVENOUS
  Administered 2017-06-01: 0.2 ug/kg/h via INTRAVENOUS
  Administered 2017-06-01 (×2): 0.4 ug/kg/h via INTRAVENOUS
  Administered 2017-06-01: 0.6 ug/kg/h via INTRAVENOUS
  Administered 2017-06-02 (×2): 0.7 ug/kg/h via INTRAVENOUS
  Administered 2017-06-02: 1.2 ug/kg/h via INTRAVENOUS
  Administered 2017-06-02 (×2): 0.7 ug/kg/h via INTRAVENOUS
  Filled 2017-06-01 (×2): qty 100
  Filled 2017-06-01 (×7): qty 50

## 2017-06-01 MED ORDER — MIDAZOLAM HCL 2 MG/2ML IJ SOLN
4.0000 mg | Freq: Once | INTRAMUSCULAR | Status: AC
  Administered 2017-06-01: 4 mg via INTRAVENOUS

## 2017-06-01 MED ORDER — HEPARIN (PORCINE) IN NACL 100-0.45 UNIT/ML-% IJ SOLN: 1750.0000 [IU]/h | INTRAMUSCULAR | Status: DC

## 2017-06-01 MED ORDER — FAMOTIDINE IN NACL 20-0.9 MG/50ML-% IV SOLN: 20.0000 mg | Freq: Two times a day (BID) | INTRAVENOUS | Status: DC

## 2017-06-01 MED ORDER — CHLORHEXIDINE GLUCONATE 0.12% ORAL RINSE (MEDLINE KIT)
15.0000 mL | Freq: Two times a day (BID) | OROMUCOSAL | Status: DC
  Administered 2017-06-01 (×2): 15 mL via OROMUCOSAL

## 2017-06-01 MED ORDER — MIDAZOLAM HCL 2 MG/2ML IJ SOLN
1.0000 mg | INTRAMUSCULAR | Status: DC | PRN
  Administered 2017-06-02 – 2017-06-04 (×4): 1 mg via INTRAVENOUS
  Filled 2017-06-01 (×6): qty 2

## 2017-06-01 MED ORDER — SODIUM CHLORIDE 0.9 % IV BOLUS
500.0000 mL | Freq: Once | INTRAVENOUS | Status: AC
  Administered 2017-06-01: 500 mL via INTRAVENOUS

## 2017-06-01 MED ORDER — HEPARIN BOLUS VIA INFUSION
5350.0000 [IU] | Freq: Once | INTRAVENOUS | Status: DC
  Filled 2017-06-01: qty 5350

## 2017-06-01 MED ORDER — FENTANYL CITRATE (PF) 100 MCG/2ML IJ SOLN
INTRAMUSCULAR | Status: AC
  Administered 2017-06-01: 50 ug via INTRAVENOUS
  Filled 2017-06-01: qty 2

## 2017-06-01 MED ORDER — MIDAZOLAM HCL 2 MG/2ML IJ SOLN
INTRAMUSCULAR | Status: AC
  Filled 2017-06-01: qty 2

## 2017-06-01 MED ORDER — FENTANYL CITRATE (PF) 100 MCG/2ML IJ SOLN
50.0000 ug | Freq: Once | INTRAMUSCULAR | Status: AC
  Administered 2017-06-01: 50 ug via INTRAVENOUS

## 2017-06-01 MED ORDER — HEPARIN BOLUS VIA INFUSION
4900.0000 [IU] | Freq: Once | INTRAVENOUS | Status: DC
  Filled 2017-06-01: qty 4900

## 2017-06-01 MED ORDER — MIDAZOLAM HCL 2 MG/2ML IJ SOLN
INTRAMUSCULAR | Status: AC
  Administered 2017-06-01: 4 mg via INTRAVENOUS
  Filled 2017-06-01: qty 2

## 2017-06-01 MED ORDER — FUROSEMIDE 10 MG/ML IJ SOLN
40.0000 mg | Freq: Once | INTRAMUSCULAR | Status: AC
  Administered 2017-06-01: 40 mg via INTRAVENOUS

## 2017-06-01 MED ORDER — ROCURONIUM BROMIDE 50 MG/5ML IV SOLN
100.0000 mg | Freq: Once | INTRAVENOUS | Status: AC
  Administered 2017-06-01: 100 mg via INTRAVENOUS

## 2017-06-01 MED ORDER — INSULIN ASPART 100 UNIT/ML ~~LOC~~ SOLN
0.0000 [IU] | SUBCUTANEOUS | Status: DC
  Administered 2017-06-01: 3 [IU] via SUBCUTANEOUS
  Administered 2017-06-01: 2 [IU] via SUBCUTANEOUS
  Administered 2017-06-02: 5 [IU] via SUBCUTANEOUS
  Administered 2017-06-02 (×2): 2 [IU] via SUBCUTANEOUS
  Administered 2017-06-02: 5 [IU] via SUBCUTANEOUS
  Administered 2017-06-02: 2 [IU] via SUBCUTANEOUS
  Administered 2017-06-03: 3 [IU] via SUBCUTANEOUS
  Administered 2017-06-03: 5 [IU] via SUBCUTANEOUS
  Administered 2017-06-03 – 2017-06-04 (×9): 3 [IU] via SUBCUTANEOUS
  Administered 2017-06-05 – 2017-06-06 (×4): 2 [IU] via SUBCUTANEOUS
  Administered 2017-06-06: 3 [IU] via SUBCUTANEOUS
  Administered 2017-06-06 – 2017-06-07 (×4): 2 [IU] via SUBCUTANEOUS
  Administered 2017-06-07: 3 [IU] via SUBCUTANEOUS
  Administered 2017-06-07 (×2): 2 [IU] via SUBCUTANEOUS
  Administered 2017-06-07: 3 [IU] via SUBCUTANEOUS
  Administered 2017-06-08: 2 [IU] via SUBCUTANEOUS
  Administered 2017-06-08 (×5): 3 [IU] via SUBCUTANEOUS
  Administered 2017-06-09 (×2): 2 [IU] via SUBCUTANEOUS
  Administered 2017-06-09 (×2): 3 [IU] via SUBCUTANEOUS
  Administered 2017-06-09: 2 [IU] via SUBCUTANEOUS
  Administered 2017-06-09: 3 [IU] via SUBCUTANEOUS
  Administered 2017-06-10 – 2017-06-12 (×3): 2 [IU] via SUBCUTANEOUS
  Administered 2017-06-12: 3 [IU] via SUBCUTANEOUS
  Administered 2017-06-12: 2 [IU] via SUBCUTANEOUS
  Administered 2017-06-12: 3 [IU] via SUBCUTANEOUS
  Administered 2017-06-13 (×2): 2 [IU] via SUBCUTANEOUS
  Administered 2017-06-13 – 2017-06-14 (×4): 3 [IU] via SUBCUTANEOUS
  Administered 2017-06-14: 5 [IU] via SUBCUTANEOUS
  Administered 2017-06-14: 2 [IU] via SUBCUTANEOUS
  Administered 2017-06-14 – 2017-06-15 (×2): 3 [IU] via SUBCUTANEOUS
  Administered 2017-06-15: 2 [IU] via SUBCUTANEOUS
  Administered 2017-06-15: 3 [IU] via SUBCUTANEOUS
  Administered 2017-06-15 (×2): 2 [IU] via SUBCUTANEOUS

## 2017-06-01 MED ORDER — PROPOFOL 1000 MG/100ML IV EMUL
INTRAVENOUS | Status: AC
  Filled 2017-06-01: qty 100

## 2017-06-01 MED ORDER — MIDAZOLAM HCL 2 MG/2ML IJ SOLN
1.0000 mg | INTRAMUSCULAR | Status: AC | PRN
  Administered 2017-06-02 – 2017-06-03 (×3): 1 mg via INTRAVENOUS
  Filled 2017-06-01 (×3): qty 2

## 2017-06-01 MED ORDER — ZINC TRACE METAL 1 MG/ML IV SOLN
INTRAVENOUS | Status: AC
  Administered 2017-06-01: 17:00:00 via INTRAVENOUS
  Filled 2017-06-01: qty 772.8

## 2017-06-01 MED ORDER — FENTANYL CITRATE (PF) 100 MCG/2ML IJ SOLN
50.0000 ug | INTRAMUSCULAR | Status: AC | PRN
  Administered 2017-06-01 (×3): 50 ug via INTRAVENOUS
  Filled 2017-06-01 (×3): qty 2

## 2017-06-01 MED ORDER — ETOMIDATE 2 MG/ML IV SOLN
20.0000 mg | Freq: Once | INTRAVENOUS | Status: AC
  Administered 2017-06-01: 20 mg via INTRAVENOUS

## 2017-06-01 MED ORDER — FENTANYL CITRATE (PF) 100 MCG/2ML IJ SOLN
50.0000 ug | INTRAMUSCULAR | Status: DC | PRN
  Administered 2017-06-03 – 2017-06-07 (×4): 50 ug via INTRAVENOUS
  Filled 2017-06-01 (×6): qty 2

## 2017-06-01 NOTE — Progress Notes (Signed)
Nutrition Follow-up  INTERVENTION:   Recommend decreasing TPN as TF started and tolerated  Recommend  Vital 1.5 @ 55 ml/hr  30 Prostat BID  Provides: 2180 kcal, 119 grams protein, and 1008 ml H2O.    NUTRITION DIAGNOSIS:   Increased nutrient needs related to (cancer) as evidenced by estimated needs. Ongoing.   GOAL:   Patient will meet greater than or equal to 90% of their needs Progressing.   MONITOR:   Diet advancement, I & O's  ASSESSMENT:   72 year old male here with iron deficiency anemia with recent rectal bleeding found to have abnormal CT scan of the abdomen and pelvis suggestive of a sigmoid cancer.  He has a history of peripheral vascular disease, aortic aneurysm with endovascular repair, CAD, hypertension, hyperlipidemia, COPD, CKD.   5/17 clear liquids 5/19 soft diet, did not want to eat 5/20 back to full liquids, MD ordered ensure enlive TID 5/22 pt re-intubated, Cortrak inserted tip post pyloric   Patient is currently intubated on ventilator support MV: 14.7 L/min Temp (24hrs), Avg:98.5 F (36.9 C), Min:97.8 F (36.6 C), Max:100 F (37.8 C)   Weight is up 74 lb since admission, per I/O records pt is positive 21 L, pt with deep pitting edema in all extremities  JP drain minimal, IR following  Medications reviewed and include: SSI Labs reviewed   TPN continues @ 70 ml/hr providing 2242 kcal, 116 grams protein, and additional 500 mg Vitamin C and 5 mg Zinc  Diet Order:   Diet Order           Diet NPO time specified  Diet effective now          EDUCATION NEEDS:   Not appropriate for education at this time  Skin:  Skin Assessment: Skin Integrity Issues: Skin Integrity Issues:: (MASD, multiple open areas on perineum ) Incisions: closed abdomen  Last BM:  150 ml via ileostomy 5/22  Height:   Ht Readings from Last 1 Encounters:  06/01/17 5\' 10"  (1.778 m)    Weight:   Wt Readings from Last 1 Encounters:  05/29/17 282 lb 13.6 oz  (128.3 kg)    Ideal Body Weight:  83.6 kg  BMI:  Body mass index is 40.58 kg/m.  Estimated Nutritional Needs:   Kcal:  2194  Protein:  105-120 grams  Fluid:  2.1 - 2.4 L/day  Maylon Peppers RD, LDN, CNSC [phone] Pager [phone] After Hours Pager

## 2017-06-01 NOTE — Progress Notes (Signed)
[facility] Progress Note Patient Name: Jesse Le DOB: [birth year] MRN: [id number]   Date of Service  06/01/2017  HPI/Events of Note  Hypoxia - O2 Sats decreased into high 70's. Now on 100% NRBM. Suspect retained secretions and atelectasis.   eICU Interventions  Will order: 1. NT suction PRN.  2. Trial of BiPAP. 3.Will ask PCCM ground team to evaluate at bedside.      Intervention Category Major Interventions: Hypoxemia - evaluation and management  Brennan Karam Eugene 06/01/2017, 5:49 AM

## 2017-06-01 NOTE — Progress Notes (Signed)
Called [facility] after patient's O2 sats in 70s and patient refusing to wear nasal cannula or NRB. Lung sounds coarse bilaterally. Patient refuses to cough. Chest PT done, attempted NTS. CCM at bedside. Orders received. Patient now on BiPAP. Will continue to monitor.

## 2017-06-01 NOTE — Progress Notes (Signed)
SLP Cancellation Note  Patient Details Name: Jesse Le MRN: [id number] DOB: 1945-07-23   Cancelled treatment:        Pt intubated. Will sign off. Please re-consult if/when needed.    [name] 06/01/2017, 11:52 AM   [name] Colvin Caroli.Ed [employer] [phone]

## 2017-06-01 NOTE — Progress Notes (Signed)
Cortrak Tube Team Note:  Consult received to place a Cortrak feeding tube.   A 10 F Cortrak tube was placed in the right nare and secured with a nasal bridle at 100 cm. Per the Cortrak monitor reading the tube tip is post pyloric.   No x-ray is required. RN may begin using tube.   If the tube becomes dislodged please keep the tube and contact the Cortrak team at [email or web address] ([email or web address]) for replacement.  If after hours and replacement cannot be delayed, place a NG tube and confirm placement with an abdominal x-ray.    Koleen Distance MS, RD, LDN Pager #- [phone] Office#- [phone] After Hours Pager: [phone]

## 2017-06-01 NOTE — Consult Note (Signed)
[facility] Nurse ostomy follow up Stoma type/location: RLQ ileostomy Existing pouch not removed. Last changed on 5/20.  No leakage; stoma producing thin brown effluent.  [place]ister 2 3/4 cut to fit two piece pouching system and pattern at bedside.  No needs identified at this time. Val Riles, RN, MSN, CWOCN, CNS-BC, pager [phone]

## 2017-06-01 NOTE — Progress Notes (Signed)
PULMONARY / CRITICAL CARE MEDICINE   Name: Jesse Le MRN: [id number] DOB: March 03, 1945    ADMISSION DATE:  05/06/2017 CONSULTATION DATE:  05/18/2017  REFERRING MD:  Dr. Terrence Dupont  CHIEF COMPLAINT:  Hypotension  HISTORY OF PRESENT ILLNESS:   72 yo male presented with abdominal pain, weight loss and intermittent hematochezia.  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  Had low anterior resection with primary anastomosis with diverting loop ileostomy.  Developed hypotension, hypoglycemia 5/07.  Developed hypotension and altered mental status 5/08 and transferred to ICU. Pt has an anastomotic leak, drain placed by surgery PMHx of CAD, DM, TAA.  5/13 The patient is awake and tracking but not really verbal. BP is running a bit high. 170-180. Patient remains ion a small dose of precedex at 0.76mcg/kg. respiratotory status appears comfortable. Does not appear to be onpressors at this time.  5/14 Little overall change in status The patient rermains lethargic and non-communicative. BP has been high requiring nicardipine drip in addition to prn labetalol and hydralazine Discussed the situation with surgery. The patient had pulled out his RLQ drain. A new CT scan ihas been ordered and he may need drain replaced.  5/15 The patient is awake and up in the chair. He went down for a CT scan today. There was less extraluminal gas in the area of the anastomosis. There is increasing ascities. A new spontaneous right retroperitoneal hematoma was noted as well. The patient remains hemodynamically stable. He is more awake and speaking to his girfriend.  5/17 The patient is awake. He appears to be a little tearful. Whe he speaks it is vvery difficult for me to understand him. He apparently  Was a bit delirious overnight. He is still requiring Precedex in varying dose. We don't have the ability yest to substitute an oral agent .  5/18 The patient is awake and talkative. He is  being allowed to take clear liquids. His BP remains elevated He has a heart rate of about 50 likely related to precedex. The patient is on a carden e drip as well to keep his BP < 465 systolic. The patient remains onTPN.  5/19 The patient looks well. He is cooperative and communicative. No major compliants. He had a T max last night to about 100.8.  There is some concern as he has a persistent and worsening leukocytosis although he had clinically looked better the past few days.The patient did grow out Pseduomonas on paracentesis and is on Zosyn the past several days. I have taken the liberty of asking Dr. Johnnye Sima of [facility] to weigh in on the case. The patient was placed on Hydrocortisone a few days ago when he wass having intractable shock. His agitation is better. He is on Seroquel. precedex is being weaned off. The cardene has been turned off  5/20 PCCM signed off.  Debridement of necrotic fascia per Surgery.  On full liquid diet; poor intake  5/22 Re-consult for progressive hypoxia throughout the night and ongoing confusion, ~70's on RA, placed on NRB then to BiPAP.  CXR- rotated film worsening R multifocal consolidation, small pleural effusions.  Net +21 L.  Hypertensive, afebrile x 48 hours  VITAL SIGNS: BP (!) 149/65 (BP Location: Left Arm)   Pulse (!) 102   Temp 98.2 F (36.8 C) (Oral)   Resp (!) 31   Ht 6' 0.99" (1.854 m)   Wt 128.3 kg (282 lb 13.6 oz)   SpO2 90%   BMI 37.33 kg/m  INTAKE / OUTPUT: I/O last 3 completed shifts: In: 3016.8 [P.O.:120; I.V.:2431.8; Other:15; IV Piggyback:450] Out: 3077 [Urine:2595; Drains:7; Stool:475]  PHYSICAL EXAMINATION: General:  Elderly male lying in bed in moderate distress/ agitated HEENT: MM pink/moist, pupils 3/reactive Neuro: Alert, confused, intermittently follows commands, MAE CV: IRIR, hypertensive PULM: even, tachypneic, R with diffuse rhonchi/rales, left coarse, currently on HFNC 15L  GI: Abd binder in place, obese,  hypo BS, ileostomy, condom cath in place Extremities: warm/dry, anasarca  Skin: no rashes   LABS:  BMET Recent Labs  Lab 05/30/17 0637 05/31/17 0720 06/01/17 0343  NA 140 142 143  K 4.3 4.3 4.1  CL 107 110 112*  CO2 24 24 22   BUN 29* 27* 27*  CREATININE 1.36* 1.22 1.32*  GLUCOSE 162* 131* 206*    Electrolytes Recent Labs  Lab 05/30/17 0637 05/31/17 0720 06/01/17 0343  CALCIUM 7.3* 7.8* 7.7*  MG 1.9 2.0 2.1  PHOS 3.8 3.3 3.1    CBC Recent Labs  Lab 05/29/17 0420 05/30/17 0344 05/31/17 0428  WBC 25.9* 25.3* 19.7*  HGB 9.2* 8.9* 9.1*  HCT 29.3* 30.8* 32.4*  PLT 300 268 247    Coag's Recent Labs  Lab 05/25/17 1638  INR 1.28    Sepsis Markers No results for input(s): LATICACIDVEN, PROCALCITON, O2SATVEN in the last 168 hours.  ABG No results for input(s): PHART, PCO2ART, PO2ART in the last 168 hours.  Liver Enzymes Recent Labs  Lab 05/26/17 0426 05/30/17 0637  AST 51* 47*  ALT 38 66*  ALKPHOS 110 269*  BILITOT 0.8 1.4*  ALBUMIN 1.2* 1.2*    Cardiac Enzymes No results for input(s): TROPONINI, PROBNP in the last 168 hours.  Glucose Recent Labs  Lab 05/31/17 0404 05/31/17 0725 05/31/17 1153 05/31/17 1545 05/31/17 2038 06/01/17 0023  GLUCAP 173* 117* 187* 185* 181* 183*    Imaging Dg Chest Port 1 View  Result Date: 06/01/2017 CLINICAL DATA:  Oxygen desaturation. EXAM: PORTABLE CHEST 1 VIEW COMPARISON:  Chest radiograph May 21, 2017 FINDINGS: Worsening multifocal RIGHT lung consolidation mild interstitial prominence and volume loss. Improved aeration LEFT lung. Small pleural effusions. Stable cardiomegaly with descending aortic stent graft. Similarly widened mediastinum. RIGHT PICC distal tip projects in distal superior vena cava. No pneumothorax. Soft tissue planes and included osseous structures are unchanged. IMPRESSION: Multifocal consolidation RIGHT lung most concerning for pneumonia. Small pleural effusions. Stable appearance of RIGHT  PICC. Electronically Signed   By: Elon Alas M.D.   On: 06/01/2017 06:01   STUDIES:  CT abd 5/09 >> discontinuity in the posterior wall just above the anastomosis with feculent appearing material and gas in the pelvis, measuring up to 5x 3 cm anterior and superior to the sigmoid colon.  Fluid collection right lower quadrant measuring up to 6.4 x 3.5 cm. Moderate pneumoperitoneum.  CT chest 5/22 >>  CULTURES: MRSA PCR 5/8 >> neg Blood culture 5/8 >> neg Abd ascess 5/16 >> pseudomonas > pan sensitive  ANTIBIOTICS: Zosyn 5/7 > 5/8 Zosyn 5/10>> Unasyn 5/8 > 5/10 Vancomycin 5/7 > Eraxis 5/8 > 5/18  LINES/TUBES: Lt IJ CVL  5/8 > 5/10 JP Drain 5/1> RUE PICC 5/10 >>  DISCUSSION: 72 yo male with sepsis from peritonitis after surgery for colon cancer and CT abd +  for anastomotic leak.>> Drain placed 5/1  ASSESSMENT / PLAN:  Hypoxic respiratory insufficiency 2/2 R lung consolidation PNA vs aspiration pneumonitis  - attempt BiPAP, if fails will need intubation - continue supplemental O2 for sats > 92% - lasix  40 mg x 1 - CT chest to further investigate right infiltrate  - NPO for now  Sepsis with septic shock from peritonitis- resolved  Abd abscess w/ pseudomonas  Colon cancer s/p low anterior resection with ileostomy w/anastomosis leak  - Abx per ID- continued on zosyn - pan cultured 5/22 - per surgery  - ongoing TPN  Acute renal failure from ATN.   - net +21 L/  anasarca  - increasing sCr since 5/19, although UOP remains adequate - diuresis as above - Trend BMET/ mag/ phos/ daily wt/ UOP    DM type II. Hypoglycemia in setting of sepsis- resolved now hyperglycemic  - CBG q 4 - change SSI to ICU protocol   Acute metabolic encephalopathy 2nd to sepsis- ongoing Follow mental status Limit sedation  Hypertension Lopressor 25 mg BID Hydralazine prn   DVT prophylaxis -SCDs only (given concern for retroperitoneal  SUP - n/a Nutrition - NPO + TPN Goals of care -  full code  Kennieth Rad, AGACNP-BC [place] Pgr: [phone] or if no answer [phone] 06/01/2017, 6:48 AM

## 2017-06-01 NOTE — Progress Notes (Signed)
[facility] PCCM  Resp acidosis on ABG, hypoxemia persists Increase RR to 24 on vent, increase PEEP to 10  Roselie Awkward, MD [place] PCCM Pager: [phone] Cell: [phone] After 3pm or if no response, call [phone]

## 2017-06-01 NOTE — Procedures (Signed)
Central Venous Catheter Insertion Procedure Note Jesse Le [id number] 10-02-1945  Procedure: Insertion of Central Venous Catheter Indications: Assessment of intravascular volume, Drug and/or fluid administration and Frequent blood sampling  Procedure Details Consent: Risks of procedure as well as the alternatives and risks of each were explained to the (patient/caregiver).  Consent for procedure obtained. Time Out: Verified patient identification, verified procedure, site/side was marked, verified correct patient position, special equipment/implants available, medications/allergies/relevent history reviewed, required imaging and test results available.  Performed  Maximum sterile technique was used including antiseptics, cap, gloves, gown, hand hygiene, mask and sheet. Skin prep: Chlorhexidine; local anesthetic administered A antimicrobial bonded/coated triple lumen catheter was placed in the right internal jugular vein using the Seldinger technique.  Evaluation Blood flow good Complications: No apparent complications Patient did tolerate procedure well. Chest X-ray ordered to verify placement.  CXR: normal.  Performed with Ultrasound guidance under the direct supervision of Jesse Gens NP and Dr. Heath Lark 06/01/2017, 2:03 PM

## 2017-06-01 NOTE — Progress Notes (Signed)
[facility] Progress Note 21 Days Post-Op  Subjective:  Agitated and disoriented  Objective: Vital signs in last 24 hours: Temp:  [97.9 F (36.6 C)-100 F (37.8 C)] 98.2 F (36.8 C) (05/22 0436) Pulse Rate:  [85-113] 85 (05/22 0700) Resp:  [17-32] 24 (05/22 0700) BP: (132-168)/(56-122) 153/73 (05/22 0700) SpO2:  [90 %-100 %] 95 % (05/22 0700) Last BM Date: (ostomy)  Intake/Output from previous day: 05/21 0701 - 05/22 0700 In: 1896.7 [P.O.:120; I.V.:1621.7; IV Piggyback:150] Out: 2145 [Urine:1805; Stool:340] Intake/Output this shift: No intake/output data recorded.  General: Agitated  Lungs: Desaturating intermittently  Abd: Wide open fascia.  Necrosis of subcutaneous tissue.  Bowel at the bottom of the wound.    Extremities: No changes  Neuro: Agitated and disoriented.  Lab Results:  @LABLAST2 (wbc:2,hgb:2,hct:2,plt:2) BMET ) Recent Labs    05/31/17 0720 06/01/17 0343  NA 142 143  K 4.3 4.1  CL 110 112*  CO2 24 22  GLUCOSE 131* 206*  BUN 27* 27*  CREATININE 1.22 1.32*  CALCIUM 7.8* 7.7*   PT/INR No results for input(s): LABPROT, INR in the last 72 hours. ABG Recent Labs    06/01/17 0628  PHART 7.412  HCO3 20.0    Studies/Results: Dg Chest Port 1 View  Result Date: 06/01/2017 CLINICAL DATA:  Oxygen desaturation. EXAM: PORTABLE CHEST 1 VIEW COMPARISON:  Chest radiograph May 21, 2017 FINDINGS: Worsening multifocal RIGHT lung consolidation mild interstitial prominence and volume loss. Improved aeration LEFT lung. Small pleural effusions. Stable cardiomegaly with descending aortic stent graft. Similarly widened mediastinum. RIGHT PICC distal tip projects in distal superior vena cava. No pneumothorax. Soft tissue planes and included osseous structures are unchanged. IMPRESSION: Multifocal consolidation RIGHT lung most concerning for pneumonia. Small pleural effusions. Stable appearance of RIGHT PICC. Electronically Signed   By: Elon Alas M.D.   On:  06/01/2017 06:01    Anti-infectives: Anti-infectives (From admission, onward)   Start     Dose/Rate Route Frequency Ordered Stop   05/28/17 1200  vancomycin (VANCOCIN) 1,250 mg in sodium chloride 0.9 % 250 mL IVPB  Status:  Discontinued     1,250 mg 166.7 mL/hr over 90 Minutes Intravenous Every 24 hours 05/27/17 1324 05/30/17 1615   05/27/17 1100  vancomycin (VANCOCIN) 1,250 mg in sodium chloride 0.9 % 250 mL IVPB  Status:  Discontinued     1,250 mg 166.7 mL/hr over 90 Minutes Intravenous Every 12 hours 05/27/17 1011 05/27/17 1324   05/20/17 2230  vancomycin (VANCOCIN) 1,250 mg in sodium chloride 0.9 % 250 mL IVPB  Status:  Discontinued     1,250 mg 166.7 mL/hr over 90 Minutes Intravenous Every 24 hours 05/20/17 2151 05/27/17 1011   05/20/17 1400  piperacillin-tazobactam (ZOSYN) IVPB 3.375 g     3.375 g 12.5 mL/hr over 240 Minutes Intravenous Every 8 hours 05/20/17 1044     05/20/17 0500  anidulafungin (ERAXIS) 100 mg in sodium chloride 0.9 % 100 mL IVPB  Status:  Discontinued     100 mg 78 mL/hr over 100 Minutes Intravenous Every 24 hours 05/19/17 0116 05/29/17 1214   05/19/17 2200  vancomycin (VANCOCIN) 1,500 mg in sodium chloride 0.9 % 500 mL IVPB  Status:  Discontinued     1,500 mg 250 mL/hr over 120 Minutes Intravenous Every 24 hours 05/18/17 2126 05/20/17 2150   05/19/17 0130  anidulafungin (ERAXIS) 200 mg in sodium chloride 0.9 % 200 mL IVPB     200 mg 78 mL/hr over 200 Minutes Intravenous  Once 05/19/17 0116 05/19/17  3491   05/19/17 0130  Ampicillin-Sulbactam (UNASYN) 3 g in sodium chloride 0.9 % 100 mL IVPB  Status:  Discontinued     3 g 200 mL/hr over 30 Minutes Intravenous Every 8 hours 05/19/17 0118 05/20/17 1044   05/18/17 2200  vancomycin (VANCOCIN) 1,750 mg in sodium chloride 0.9 % 500 mL IVPB     1,750 mg 250 mL/hr over 120 Minutes Intravenous  Once 05/18/17 2126 05/19/17 0400   05/17/17 0930  piperacillin-tazobactam (ZOSYN) IVPB 3.375 g  Status:  Discontinued      3.375 g 12.5 mL/hr over 240 Minutes Intravenous Every 8 hours 05/17/17 0836 05/19/17 0118   05/11/17 2200  cefoTEtan (CEFOTAN) 2 g in sodium chloride 0.9 % 100 mL IVPB     2 g 200 mL/hr over 30 Minutes Intravenous Every 12 hours 05/11/17 1839 05/11/17 2034   05/11/17 0600  cefoTEtan in Dextrose 5% (CEFOTAN) IVPB 2 g     2 g Intravenous On call to O.R. 05/10/17 1142 05/11/17 1416      Assessment/Plan: s/p Procedure(s): Colon Resection Sigmoid Low Anterior Resection with Staple Colon Relast HERNIA REPAIR UMBILICAL ADULT Creation Vertical Loop ILEOSTOMY Cortrak for nutrition.  LOS: 26 days   Kathryne Eriksson. Dahlia Bailiff, MD, FACS [phone] [facility] 06/01/2017

## 2017-06-01 NOTE — Procedures (Signed)
PCCM Video Bronchoscopy Procedure Note  The patient was informed of the risks (including but not limited to bleeding, infection, respiratory failure, lung injury, tooth/oral injury) and benefits of the procedure and gave consent, see chart.  Indication: Acute respiratory failure with hypoxemia  Post Procedure Diagnosis: Mucus plugging of RLL, RML  Location: 2H ICU [facility]  Condition pre procedure: Critically ill, on vent  Medications for procedure: sedation per PAD protocol for mechanical ventilation  Procedure description: The bronchoscope was introduced through the endotracheal tube and passed to the bilateral lungs to the level of the subsegmental bronchi throughout the tracheobronchial tree.  Airway exam revealed normal trachea, sharp carina, normal left lung anatomy without airway lesion or mass or secretions.  However the right tracheobronchial tree showed mucus plugging (thick, grey mucus) and complete obstruction of the right middle lobe, superior segment of the RLL and partial occlusion of the basilar segments of the RLL.   Procedures performed:  1) BAL RML 2) Suctioning of the RML and RLL mucus plugs.  Partial clearance achieved  Specimens sent: BAL RML  Condition post procedure: critically ill, on vent  EBL: none from procedure  Complications: none immediate  Roselie Awkward, MD [place] PCCM Pager: [phone] Cell: [phone] After 3pm or if no response, call [phone]

## 2017-06-01 NOTE — Progress Notes (Signed)
PHARMACY - ADULT TOTAL PARENTERAL NUTRITION CONSULT NOTE   Pharmacy Consult:  TPN Indication: Prolonged ileus  Patient Measurements: Height: 5\' 10"  (177.8 cm) Weight: 282 lb 13.6 oz (128.3 kg) IBW/kg (Calculated) : 73 TPN AdjBW (KG): 94.5 Body mass index is 40.58 kg/m.  Assessment:  55 YOM with PMH significant for IDA with recent rectal bleed, PVD, CAD, HTN, HLD, COPD, and CKD, found to have rectosigmoid colon adenocarcinoma mass s/p lower anterior resection with primary colorectal anastomosis and diverting loop ileostomy with umbilical hernia repair on 05/11/17.  Developed anastomotic leak with feculent output in JP drain. Patient currently with high ileostomy output. Patient's abdomen has become more distended and patient was made NPO. Pharmacy consulted for TPN.   Patient was on clear liquids (on and off NPO as well) but only tolerating minimal intake (<25%) for >10 days. Patient is at risk for refeeding.   GI: abd wound dehiscence. Prealbumin <5 - now up to 21.7, 225 ml ileostomy O/P, drain O/P 15mL. Pepcid in TPN. LBM 5/20. Tolerated clears 5/19 > soft diet but back to full liquids 5/20, as pt did not want to eat. Now to place Cortrak Endo: no hx DM - hypoglycemic prior to TPN. Off steroids. CBGs up a bit today 158-206 (?stress related) after previously controlled Insulin requirements in the past 24 hours: 17 units SSI + 15 units in TPN Lytes: all WNL except Cl up to 112 / CO2 WNL Renal: SCr up to 1.32, BUN stable 27 - UOP 0.6 ml/kg/hr, +21 L since admit Pulm: [place] > intubated 5/22 with worsening respiratory status Cards: BP trending up, HR ST 80-100s. Lopressor, PRN hydralazine. Anasarca - lasix x 1 on 5/22 Hepatobil: LFTs now mildly elevated, tbili up to 1.4, TG WNL Heme: CT shows spontaneous RTP hematoma, holding heparin - low but stable, plts WNL Neuro: Precedex gtt resumed - ongoing confusion. PRN fent/versed. GCS 14, RASS 1 (goal 0), CPOT 0 ID: [email or web address] for abdominal abscess due to  pan-sensitive pseudomonas - ID recommending 7 more days from 5/21, stopped Eraxis/Vanc. Changing central line 5/22. 5/22 UC pending. Afebrile, WBC down to 19.7 TPN Access: CVC triple lumen placed 05/18/17 TPN start date: 05/20/17  Nutritional Goals (per RD rec on 5/9): 2100-2400 kCal, 105-120g protein, 2.1- 2.4 L per day  Current Nutrition:  TPN  Plan:  Continue concentrated TPN at 70 ml/hr, providing 116g AA, 370g CHO and 52g ILE for a total of 2242 kCal, meeting 100% of patient's needs. Electrolytes in TPN: reduce K 5/20 with SCr bump, increase Mag slightly 5/20; Increase phos slightly 5/21; Cl:Ac to 1:2 Daily multivitamin and trace elements in TPN Continue SSI moderate Q4H + regular insulin 15 units added to TPN - no further adjustments today since previously controlled, f/u trend in CBGs tomorrow Pepcid 40mg  daily in TPN Vitamin C 500mg  and Zinc 5mg  added to TPN to help with wound healing (started 5/18) F/U Cortak placement and tube feed start for ability to wean TPN  Elicia Lamp, PharmD, BCPS Clinical Pharmacist Clinical phone for 06/01/2017 until 3:30pm: [phone] If after 3:30pm, please call main pharmacy at: [phone] 06/01/2017 9:19 AM

## 2017-06-01 NOTE — Procedures (Signed)
Intubation Procedure Note Jesse Le [id number] August 22, 1945  Procedure: Intubation Indications: Airway protection and maintenance  Procedure Details Consent: Risks of procedure as well as the alternatives and risks of each were explained to the (patient/caregiver).  Consent for procedure obtained. Time Out: Verified patient identification, verified procedure, site/side was marked, verified correct patient position, special equipment/implants available, medications/allergies/relevent history reviewed, required imaging and test results available.  Performed  Drugs Etomidate 20 mg IV, Versed 2mg , Fentanyl 18mcg IV, Rocuronium 100mg  IV DL x 1 with GS 3 blade Grade 1 view > thick brown secretions noted in pharynx around vocal cords 7.5 ET tube passed through cords under direct visualization Placement confirmed with bilateral breath sounds, positive EtCO2 change and smoke in tube   Evaluation Hemodynamic Status: BP stable throughout; O2 sats: transiently fell during during procedure Patient's Current Condition: stable Complications: No apparent complications Patient did tolerate procedure well. Chest X-ray ordered to verify placement.  CXR: pending.   Simonne Maffucci 06/01/2017

## 2017-06-01 NOTE — Progress Notes (Signed)
Patient tore bipap off of his face and is refusing to wear it. RT try to place mask back on patient and he became combative. NRB was placed on patient and he is tolerating well at this time.

## 2017-06-01 NOTE — Progress Notes (Signed)
Subjective:  Appreciated CCM reconsult .  Patient noted to be more confused, tachycardic and hypoxic earlier this a.m. Chest x-ray showed multilobar right infiltrates.  Patient afebrile  Objective:  Vital Signs in the last 24 hours: Temp:  [97.8 F (36.6 C)-100 F (37.8 C)] 97.8 F (36.6 C) (05/22 0810) Pulse Rate:  [85-113] 85 (05/22 0700) Resp:  [17-32] 24 (05/22 0700) BP: (132-168)/(56-122) 153/73 (05/22 0700) SpO2:  [90 %-100 %] 95 % (05/22 0700)  Intake/Output from previous day: 05/21 0701 - 05/22 0700 In: 1896.7 [P.O.:120; I.V.:1621.7; IV Piggyback:150] Out: 2145 [Urine:1805; Stool:340] Intake/Output from this shift: No intake/output data recorded.  Physical Exam: Neck: no adenopathy, no carotid bruit, no JVD and supple, symmetrical, trachea midline Lungs: decrdeased breath sounds at bases.  Patient is very uncooperative Heart: tachycardic S1, S2, soft Abdomen: distended abdominal binder noted.  Surgical wound dressing dry, noted to have wide-open fascia.  As per surgery and bowel at the bottom of the wound Extremities: tender left edema noted  Lab Results: Recent Labs    05/30/17 0344 05/31/17 0428  WBC 25.3* 19.7*  HGB 8.9* 9.1*  PLT 268 247   Recent Labs    05/31/17 0720 06/01/17 0343  NA 142 143  K 4.3 4.1  CL 110 112*  CO2 24 22  GLUCOSE 131* 206*  BUN 27* 27*  CREATININE 1.22 1.32*   No results for input(s): TROPONINI in the last 72 hours.  Invalid input(s): CK, MB Hepatic Function Panel Recent Labs    05/30/17 0637  PROT 5.3*  ALBUMIN 1.2*  AST 47*  ALT 66*  ALKPHOS 269*  BILITOT 1.4*   No results for input(s): CHOL in the last 72 hours. No results for input(s): PROTIME in the last 72 hours.  Imaging: Imaging results have been reviewed and Dg Chest Port 1 View  Result Date: 06/01/2017 CLINICAL DATA:  Oxygen desaturation. EXAM: PORTABLE CHEST 1 VIEW COMPARISON:  Chest radiograph May 21, 2017 FINDINGS: Worsening multifocal RIGHT lung  consolidation mild interstitial prominence and volume loss. Improved aeration LEFT lung. Small pleural effusions. Stable cardiomegaly with descending aortic stent graft. Similarly widened mediastinum. RIGHT PICC distal tip projects in distal superior vena cava. No pneumothorax. Soft tissue planes and included osseous structures are unchanged. IMPRESSION: Multifocal consolidation RIGHT lung most concerning for pneumonia. Small pleural effusions. Stable appearance of RIGHT PICC. Electronically Signed   By: Elon Alas M.D.   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

## 2017-06-01 NOTE — Progress Notes (Addendum)
OT Cancellation Note  Patient Details Name: Jesse Le MRN: [id number] DOB: 1945/12/16   Cancelled Treatment:    Reason Eval/Treat Not Completed: Medical issues which prohibited therapy;Patient not medically ready. Pt with medical decline and now intubated. Will sign off. Please re-consult when appropriate for therapy. Thank you.   [place], OTR/L Acute Rehab Pager: [phone] Office: [phone] 06/01/2017, 4:12 PM

## 2017-06-02 ENCOUNTER — Inpatient Hospital Stay (HOSPITAL_COMMUNITY): Payer: Medicare Other

## 2017-06-02 LAB — BLOOD GAS, ARTERIAL
Acid-base deficit: 7.8 mmol/L — ABNORMAL HIGH (ref 0.0–2.0)
Bicarbonate: 20.3 mmol/L (ref 20.0–28.0)
FIO2: 100
LHR: 14 {breaths}/min
MECHVT: 580 mL
O2 SAT: 83.7 %
PATIENT TEMPERATURE: 98.6
PEEP: 5 cmH2O
PO2 ART: 69.3 mmHg — AB (ref 83.0–108.0)
pCO2 arterial: 66.8 mmHg (ref 32.0–48.0)
pH, Arterial: 7.111 — CL (ref 7.350–7.450)

## 2017-06-02 LAB — COMPREHENSIVE METABOLIC PANEL
ALK PHOS: 126 U/L (ref 38–126)
ALT: 38 U/L (ref 17–63)
AST: 33 U/L (ref 15–41)
Albumin: 1.2 g/dL — ABNORMAL LOW (ref 3.5–5.0)
Anion gap: 8 (ref 5–15)
BILIRUBIN TOTAL: 1.2 mg/dL (ref 0.3–1.2)
BUN: 41 mg/dL — ABNORMAL HIGH (ref 6–20)
CALCIUM: 7.5 mg/dL — AB (ref 8.9–10.3)
CHLORIDE: 114 mmol/L — AB (ref 101–111)
CO2: 22 mmol/L (ref 22–32)
CREATININE: 1.75 mg/dL — AB (ref 0.61–1.24)
GFR, EST AFRICAN AMERICAN: 43 mL/min — AB (ref >60)
GFR, EST NON AFRICAN AMERICAN: 37 mL/min — AB (ref >60)
Glucose, Bld: 217 mg/dL — ABNORMAL HIGH (ref 65–99)
Potassium: 4.7 mmol/L (ref 3.5–5.1)
Sodium: 144 mmol/L (ref 135–145)
TOTAL PROTEIN: 5.5 g/dL — AB (ref 6.5–8.1)

## 2017-06-02 LAB — GLUCOSE, CAPILLARY
GLUCOSE-CAPILLARY: 140 mg/dL — AB (ref 65–99)
GLUCOSE-CAPILLARY: 145 mg/dL — AB (ref 65–99)
GLUCOSE-CAPILLARY: 201 mg/dL — AB (ref 65–99)
GLUCOSE-CAPILLARY: 239 mg/dL — AB (ref 65–99)
GLUCOSE-CAPILLARY: 88 mg/dL (ref 65–99)
Glucose-Capillary: 125 mg/dL — ABNORMAL HIGH (ref 65–99)

## 2017-06-02 LAB — CBC
HCT: 28.1 % — ABNORMAL LOW (ref 39.0–52.0)
Hemoglobin: 8.2 g/dL — ABNORMAL LOW (ref 13.0–17.0)
MCH: 25.9 pg — ABNORMAL LOW (ref 26.0–34.0)
MCHC: 29.2 g/dL — AB (ref 30.0–36.0)
MCV: 88.6 fL (ref 78.0–100.0)
Platelets: 246 10*3/uL (ref 150–400)
RBC: 3.17 MIL/uL — ABNORMAL LOW (ref 4.22–5.81)
RDW: 26.4 % — AB (ref 11.5–15.5)
WBC: 17.4 10*3/uL — ABNORMAL HIGH (ref 4.0–10.5)

## 2017-06-02 LAB — URINE CULTURE: Culture: NO GROWTH

## 2017-06-02 LAB — MAGNESIUM: MAGNESIUM: 2.3 mg/dL (ref 1.7–2.4)

## 2017-06-02 LAB — PHOSPHORUS: PHOSPHORUS: 5.3 mg/dL — AB (ref 2.5–4.6)

## 2017-06-02 MED ORDER — CHLORHEXIDINE GLUCONATE 0.12 % MT SOLN
OROMUCOSAL | Status: AC
  Administered 2017-06-02: 15 mL via OROMUCOSAL
  Filled 2017-06-02: qty 15

## 2017-06-02 MED ORDER — NOREPINEPHRINE 4 MG/250ML-% IV SOLN
0.0000 ug/min | INTRAVENOUS | Status: DC
  Filled 2017-06-02: qty 250

## 2017-06-02 MED ORDER — VITAL 1.5 CAL PO LIQD
1000.0000 mL | ORAL | Status: DC
  Administered 2017-06-02 – 2017-06-03 (×2): 1000 mL
  Filled 2017-06-02 (×7): qty 1000

## 2017-06-02 MED ORDER — NOREPINEPHRINE 16 MG/250ML-% IV SOLN
0.0000 ug/min | INTRAVENOUS | Status: DC
  Administered 2017-06-02: 5 ug/min via INTRAVENOUS
  Filled 2017-06-02 (×2): qty 250

## 2017-06-02 MED ORDER — FAMOTIDINE IN NACL 20-0.9 MG/50ML-% IV SOLN: 20.0000 mg | Freq: Two times a day (BID) | INTRAVENOUS | Status: DC

## 2017-06-02 MED ORDER — FAT EMULSION PLANT BASED 30 % IV EMUL
INTRAVENOUS | Status: AC
  Administered 2017-06-02: 17:00:00 via INTRAVENOUS
  Filled 2017-06-02: qty 772.8

## 2017-06-02 MED ORDER — PRO-STAT SUGAR FREE PO LIQD
30.0000 mL | Freq: Two times a day (BID) | ORAL | Status: DC
  Administered 2017-06-02 – 2017-06-09 (×14): 30 mL
  Filled 2017-06-02 (×14): qty 30

## 2017-06-02 MED ORDER — ORAL CARE MOUTH RINSE
15.0000 mL | OROMUCOSAL | Status: DC
  Administered 2017-06-02 – 2017-06-07 (×55): 15 mL via OROMUCOSAL

## 2017-06-02 MED ORDER — FENTANYL CITRATE (PF) 100 MCG/2ML IJ SOLN
100.0000 ug | Freq: Once | INTRAMUSCULAR | Status: AC
  Administered 2017-06-02: 100 ug via INTRAVENOUS

## 2017-06-02 MED ORDER — DEXMEDETOMIDINE HCL IN NACL 400 MCG/100ML IV SOLN
0.0000 ug/kg/h | INTRAVENOUS | Status: DC
  Administered 2017-06-02: 1.2 ug/kg/h via INTRAVENOUS
  Administered 2017-06-02: 0.9 ug/kg/h via INTRAVENOUS
  Administered 2017-06-02: 1 ug/kg/h via INTRAVENOUS
  Administered 2017-06-03: 1.2 ug/kg/h via INTRAVENOUS
  Administered 2017-06-03: 1 ug/kg/h via INTRAVENOUS
  Administered 2017-06-03: 1.002 ug/kg/h via INTRAVENOUS
  Administered 2017-06-03: 1.2 ug/kg/h via INTRAVENOUS
  Administered 2017-06-03: 1 ug/kg/h via INTRAVENOUS
  Administered 2017-06-03 – 2017-06-04 (×2): 1.2 ug/kg/h via INTRAVENOUS
  Administered 2017-06-04: 1 ug/kg/h via INTRAVENOUS
  Administered 2017-06-04 (×3): 1.2 ug/kg/h via INTRAVENOUS
  Administered 2017-06-04: 1 ug/kg/h via INTRAVENOUS
  Administered 2017-06-05 (×2): 0.8 ug/kg/h via INTRAVENOUS
  Administered 2017-06-05: 1.2 ug/kg/h via INTRAVENOUS
  Administered 2017-06-05: 0.9 ug/kg/h via INTRAVENOUS
  Administered 2017-06-05 – 2017-06-07 (×9): 0.8 ug/kg/h via INTRAVENOUS
  Administered 2017-06-07: 0.5 ug/kg/h via INTRAVENOUS
  Administered 2017-06-07 (×3): 0.8 ug/kg/h via INTRAVENOUS
  Filled 2017-06-02 (×37): qty 100

## 2017-06-02 MED ORDER — CHLORHEXIDINE GLUCONATE 0.12% ORAL RINSE (MEDLINE KIT)
15.0000 mL | Freq: Two times a day (BID) | OROMUCOSAL | Status: DC
  Administered 2017-06-02 – 2017-06-07 (×11): 15 mL via OROMUCOSAL

## 2017-06-02 NOTE — Consult Note (Signed)
   [facility] Inpatient Consult   06/02/2017  Jesse Le 1945-11-30 [id number]   Patient screened for long length of stayl [facility] services. Patient is in the [facility] of the [facility] [facility] services under patient's [employer] plan. Chart review reveals patient is intubated and sedated on vent [Cancer of the sigmoid colon post resection]. For questions contact:   Natividad Brood, RN BSN [facility] Liaison  [phone] business mobile phone Toll free office [phone]

## 2017-06-02 NOTE — Plan of Care (Signed)
Pt continues to maintain 02 sats at 100% w/ ventilator support. Pt was experiencing SBP in the 70s but BP has been WDL w/ "pressor" support. Pt is usually in NSR but becomes Sinus Brady at rest (Pt is sedated). Pt continues to receive TPN via central line. Pt being turned q2 in order to prevent further skin breakdown.

## 2017-06-02 NOTE — Progress Notes (Signed)
Subjective:  Patient underwent bronchoscopy and suctioning of right middle and right lower lobe mucous plugs with partial clearing.  Intubated, sedated on low-dose pressors, requiring high FiO2  Objective:  Vital Signs in the last 24 hours: Temp:  [98 F (36.7 C)-99.1 F (37.3 C)] 98 F (36.7 C) (05/23 0738) Pulse Rate:  [50-110] 56 (05/23 0750) Resp:  [14-29] 24 (05/23 0750) BP: (75-158)/(46-85) 135/79 (05/23 0700) SpO2:  [82 %-100 %] 100 % (05/23 0750) Arterial Line BP: (123-188)/(39-71) 173/54 (05/23 0700) FiO2 (%):  [60 %-80 %] 60 % (05/23 0750)  Intake/Output from previous day: 05/22 0701 - 05/23 0700 In: 2001.2 [I.V.:1851.2; IV Piggyback:150] Out: 1150 [Urine:1000; Stool:150] Intake/Output from this shift: Total I/O In: -  Out: 120 [Urine:110; Drains:10]  Physical Exam: Neck: no adenopathy, no carotid bruit, no JVD and supple, symmetrical, trachea midline Lungs: Bilateral rhonchi noted Heart: Bradycardic S1 and S2 soft Abdomen: Distended abdominal binder noted Extremities: Generalized edema slightly improved  Lab Results: Recent Labs    05/31/17 0428  WBC 19.7*  HGB 9.1*  PLT 247   Recent Labs    06/01/17 0343 06/02/17 0500  NA 143 144  K 4.1 4.7  CL 112* 114*  CO2 22 22  GLUCOSE 206* 217*  BUN 27* 41*  CREATININE 1.32* 1.75*   No results for input(s): TROPONINI in the last 72 hours.  Invalid input(s): CK, MB Hepatic Function Panel Recent Labs    06/02/17 0500  PROT 5.5*  ALBUMIN 1.2*  AST 33  ALT 38  ALKPHOS 126  BILITOT 1.2   No results for input(s): CHOL in the last 72 hours. No results for input(s): PROTIME in the last 72 hours.  Imaging: Imaging results have been reviewed and Dg Chest Port 1 View  Result Date: 06/02/2017 CLINICAL DATA:  Acute respiratory failure with hypoxemia. Endotracheal tube present. EXAM: PORTABLE CHEST 1 VIEW COMPARISON:  06/01/2017 FINDINGS: Endotracheal tube is appropriately positioned, measuring 3.9 cm above  the carina. Feeding tube extends into the upper abdomen but the tip is not visualized. Again noted is an aortic stent graft involving the aortic arch and descending thoracic aorta and thoracoabdominal aorta. Density around the stent graft compatible with the thoracic aortic aneurysm sac. Persistent airspace disease in the right upper lung has minimally changed. PICC line tip and right jugular central venous catheter are both in the lower SVC region. Cardiac silhouette is stable for size but poorly characterized due to the enlarged thoracic aorta. IMPRESSION: No significant change in the airspace disease within the right upper lung. Findings are suggestive for pneumonia. Support apparatuses are appropriately positioned as described. Incomplete evaluation of the feeding tube. Electronically Signed   By: Markus Daft M.D.   On: 06/02/2017 09:31   Dg Chest Port 1 View  Addendum Date: 06/01/2017   ADDENDUM REPORT: 06/01/2017 17:27 ADDENDUM: Right internal jugular central line tip is at the cavoatrial junction. Right PICC line tip is in the lower SVC. Electronically Signed   By: Rolm Baptise M.D.   On: 06/01/2017 17:27   Result Date: 06/01/2017 CLINICAL DATA:  Status post central line placement EXAM: PORTABLE CHEST 1 VIEW COMPARISON:  06/01/2017 FINDINGS: Endotracheal tube and feeding catheter in satisfactory position. Aortic stent graft is again identified and stable. Right jugular central line and right-sided PICC line are noted in satisfactory position. No pneumothorax is noted. Left lung is well aerated with minimal left basilar atelectasis. Increasing right upper lobe infiltrate is noted. IMPRESSION: Increasing right upper lobe infiltrate. No  pneumothorax following central line placement. The remainder the exam is stable. Electronically Signed: By: Inez Catalina M.D. On: 06/01/2017 13:26   Portable Chest X-ray  Result Date: 06/01/2017 CLINICAL DATA:  Endotracheal tube placement. EXAM: PORTABLE CHEST 1 VIEW  COMPARISON:  06/01/2017 and CT chest 05/06/2017. FINDINGS: Endotracheal tube terminates approximately 4.2 cm above the carina. Right PICC tip is in the low SVC. Descending thoracic aortic aneurysm with indwelling thoracoabdominal stent. Patchy airspace consolidation throughout the right lung with a right pleural effusion. Minimal left basilar atelectasis. IMPRESSION: 1. Endotracheal tube is in satisfactory position. 2. Patchy airspace opacification throughout the right lung may be due to pneumonia. Right pleural effusion. Electronically Signed   By: Lorin Picket M.D.   On: 06/01/2017 10:07   Dg Chest Port 1 View  Result Date: 06/01/2017 CLINICAL DATA:  Oxygen desaturation. EXAM: PORTABLE CHEST 1 VIEW COMPARISON:  Chest radiograph May 21, 2017 FINDINGS: Worsening multifocal RIGHT lung consolidation mild interstitial prominence and volume loss. Improved aeration LEFT lung. Small pleural effusions. Stable cardiomegaly with descending aortic stent graft. Similarly widened mediastinum. RIGHT PICC distal tip projects in distal superior vena cava. No pneumothorax. Soft tissue planes and included osseous structures are unchanged. IMPRESSION: Multifocal consolidation RIGHT lung most concerning for pneumonia. Small pleural effusions. Stable appearance of RIGHT PICC. Electronically Signed   By: Elon Alas M.D.   On: 06/01/2017 06:01    Cardiac Studies:  Assessment/Plan:   Rectosigmoid adenocarcinoma status post resection with primary colorectal anastomosis and dilating loop ileostomy and umbilical hernia repair Acute respiratory failure secondary to aspiration pneumonia status post bronchoscopy and suctioning of right middle and lower lobe with partial clearing Spontaneous retroperitoneal hematoma Abdominalsurgical wound infection with bowel exposed. Metabolic encephalopathy Status postSeptichypotensive shock secondary Intra-abdominal sepsis/possible abscess. 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

## 2017-06-02 NOTE — Progress Notes (Signed)
Nutrition Follow-up  INTERVENTION:   Recommend decreasing TPN as TF started and tolerated  Vital 1.5 @ 55 ml/hr via Cortrak  30 Prostat BID  Provides: 2180 kcal, 119 grams protein, and 1008 ml H2O.    NUTRITION DIAGNOSIS:   Increased nutrient needs related to (cancer) as evidenced by estimated needs. Ongoing.   GOAL:   Patient will meet greater than or equal to 90% of their needs Progressing.   MONITOR:   Diet advancement, I & O's  ASSESSMENT:   72 year old male here with iron deficiency anemia with recent rectal bleeding found to have abnormal CT scan of the abdomen and pelvis suggestive of a sigmoid cancer.  He has a history of peripheral vascular disease, aortic aneurysm with endovascular repair, CAD, hypertension, hyperlipidemia, COPD, CKD.  Spoke with RN, pharmacy, and MD this am.  Plan for pharmacy to d/c TPN 5/24 if TF tolerated  5/17 clear liquids 5/19 soft diet, did not want to eat 5/20 back to full liquids, MD ordered ensure enlive TID 5/22 pt re-intubated, Cortrak inserted tip in stomach 5/23 spoke with Hulen Skains, ok to start TF  CT of abd shows Progressive multifocal loculated fluid and gas collections throughout much of the peritoneal space compared to 05/25/2017 despite the well-positioned right perihepatic percutaneous drainage catheter suggesting persistent anastomotic leak and/or peritoneal abscess. A very large fluid and gas collection now extends from the right subdiaphragmatic space inferiorly and laterally spanning across the entirety of the upper abdomen. There is a second large fluid collection in the left lower quadrant extending into the small bowel mesenteries which has also enlarged compared to prior. There may be some communication between these 2 collections.  Patient is currently intubated on ventilator support MV: 14.7 L/min Temp (24hrs), Avg:98.3 F (36.8 C), Min:97.6 F (36.4 C), Max:99.1 F (37.3 C)   Weight is up 74 lb since  admission, per I/O records pt is positive 21 L, pt with deep pitting edema in all extremities  JP drain minimal, IR following  Medications reviewed and include: SSI Labs reviewed   TPN continues @ 70 ml/hr providing 2242 kcal, 116 grams protein, and additional 500 mg Vitamin C and 5 mg Zinc  Diet Order:   Diet Order           Diet NPO time specified  Diet effective now          EDUCATION NEEDS:   Not appropriate for education at this time  Skin:  Skin Assessment: Skin Integrity Issues: Skin Integrity Issues:: (MASD, multiple open areas on perineum ) Incisions: closed abdomen  Last BM:  150 ml via ileostomy 5/22  Height:   Ht Readings from Last 1 Encounters:  06/01/17 5\' 10"  (1.778 m)    Weight:   Wt Readings from Last 1 Encounters:  06/02/17 268 lb 8.3 oz (121.8 kg)    Ideal Body Weight:  83.6 kg  BMI:  Body mass index is 38.53 kg/m.  Estimated Nutritional Needs:   Kcal:  2194  Protein:  105-120 grams  Fluid:  2.1 - 2.4 L/day  Maylon Peppers RD, LDN, CNSC [phone] Pager [phone] After Hours Pager

## 2017-06-02 NOTE — Progress Notes (Signed)
Pharmacy Antibiotic Note  Jesse Le is a 72 y.o. male admitted on 05/06/2017 with sepsis. Now being treated for intra-abdominal infection.  Pharmacy has been consulted for Zosyn.   Tmax 99.1, WBC 25.9>17.4, renal function declining 1.22>1.32>1.75. Day 16 of antibiotics  Plan:   Zosyn 3.375g IV q 8 hrs Consider stop date of 5/27 Will sign off for now  Height: 5\' 10"  (177.8 cm) Weight: 282 lb 13.6 oz (128.3 kg) IBW/kg (Calculated) : 73  Temp (24hrs), Avg:98.4 F (36.9 C), Min:97.6 F (36.4 C), Max:99.1 F (37.3 C)  Recent Labs  Lab 05/28/17 0326 05/29/17 0420 05/30/17 0344 05/30/17 0637 05/31/17 0428 05/31/17 0720 06/01/17 0343 06/02/17 0500  WBC 21.8* 25.9* 25.3*  --  19.7*  --   --  17.4*  CREATININE 1.05 1.15  --  1.36*  --  1.22 1.32* 1.75*    Estimated Creatinine Clearance: 51.3 mL/min (A) (by C-G formula based on SCr of 1.75 mg/dL (H)).    No Known Allergies  Antimicrobials this admission: Zosyn 5/7>>5/9; 5/10>> Unasyn 5/9>>5/10 Vancomycin 5/7>>5/20 Eraxis 5/8>>5/19  Dose adjustments this admission: 5/14 VT =16 > cont 1250/24h  Microbiology results: 5/8 BCx: Neg 5/8 MRSA PCR: neg  Nida Boatman, PharmD PGY1 Acute Care Pharmacy Resident Pager: [phone] 06/02/2017 1:41 PM

## 2017-06-02 NOTE — Progress Notes (Signed)
Jesse Le Progress Note 22 Days Post-Op  Subjective: Patient was reintubated yesterday for respiratory failure.  He is still on fairly high ventilatory settings this AM  Objective: Vital signs in last 24 hours: Temp:  [98 F (36.7 C)-99.1 F (37.3 C)] 98 F (36.7 C) (05/23 0738) Pulse Rate:  [50-110] 50 (05/23 0700) Resp:  [14-29] 24 (05/23 0700) BP: (75-158)/(46-85) 135/79 (05/23 0700) SpO2:  [82 %-100 %] 100 % (05/23 0700) Arterial Line BP: (123-188)/(39-71) 173/54 (05/23 0700) FiO2 (%):  [70 %-100 %] 70 % (05/23 0430) Last BM Date: 05/31/17  Intake/Output from previous day: 05/22 0701 - 05/23 0700 In: 2001.2 [I.V.:1851.2; IV Piggyback:150] Out: 1150 [Urine:1000; Stool:150] Intake/Output this shift: No intake/output data recorded.  General: Intubated and sedated  Lungs: Clear.  FIO2 60% with PEEP 10  Abd: Soft, ileoatomy is functioning well.  Starting tube feedings through Cortrak today.  Midline wound is not worse, but has blue staining of Pseudomonas, but no sweet smell.  Cannot use Dakin's with bowel exposed.  Extremities: No changes  Neuro: Sedated  Lab Results:  @LABLAST2 (wbc:2,hgb:2,hct:2,plt:2) BMET ) Recent Labs    06/01/17 0343 06/02/17 0500  NA 143 144  K 4.1 4.7  CL 112* 114*  CO2 22 22  GLUCOSE 206* 217*  BUN 27* 41*  CREATININE 1.32* 1.75*  CALCIUM 7.7* 7.5*   PT/INR No results for input(s): LABPROT, INR in the last 72 hours. ABG Recent Labs    06/01/17 0916 06/01/17 1309 06/01/17 1403  PHART 7.111*  --  7.338*  HCO3 20.3 23.0 24.1    Studies/Results: Dg Chest Port 1 View  Addendum Date: 06/01/2017   ADDENDUM REPORT: 06/01/2017 17:27 ADDENDUM: Right internal jugular central line tip is at the cavoatrial junction. Right PICC line tip is in the lower SVC. Electronically Signed   By: Rolm Baptise M.D.   On: 06/01/2017 17:27   Result Date: 06/01/2017 CLINICAL DATA:  Status post central line placement EXAM: PORTABLE CHEST 1 VIEW  COMPARISON:  06/01/2017 FINDINGS: Endotracheal tube and feeding catheter in satisfactory position. Aortic stent graft is again identified and stable. Right jugular central line and right-sided PICC line are noted in satisfactory position. No pneumothorax is noted. Left lung is well aerated with minimal left basilar atelectasis. Increasing right upper lobe infiltrate is noted. IMPRESSION: Increasing right upper lobe infiltrate. No pneumothorax following central line placement. The remainder the exam is stable. Electronically Signed: By: Inez Catalina M.D. On: 06/01/2017 13:26   Portable Chest X-ray  Result Date: 06/01/2017 CLINICAL DATA:  Endotracheal tube placement. EXAM: PORTABLE CHEST 1 VIEW COMPARISON:  06/01/2017 and CT chest 05/06/2017. FINDINGS: Endotracheal tube terminates approximately 4.2 cm above the carina. Right PICC tip is in the low SVC. Descending thoracic aortic aneurysm with indwelling thoracoabdominal stent. Patchy airspace consolidation throughout the right lung with a right pleural effusion. Minimal left basilar atelectasis. IMPRESSION: 1. Endotracheal tube is in satisfactory position. 2. Patchy airspace opacification throughout the right lung may be due to pneumonia. Right pleural effusion. Electronically Signed   By: Lorin Picket M.D.   On: 06/01/2017 10:07   Dg Chest Port 1 View  Result Date: 06/01/2017 CLINICAL DATA:  Oxygen desaturation. EXAM: PORTABLE CHEST 1 VIEW COMPARISON:  Chest radiograph May 21, 2017 FINDINGS: Worsening multifocal RIGHT lung consolidation mild interstitial prominence and volume loss. Improved aeration LEFT lung. Small pleural effusions. Stable cardiomegaly with descending aortic stent graft. Similarly widened mediastinum. RIGHT PICC distal tip projects in distal superior vena cava. No pneumothorax. Soft tissue  planes and included osseous structures are unchanged. IMPRESSION: Multifocal consolidation RIGHT lung most concerning for pneumonia. Small pleural  effusions. Stable appearance of RIGHT PICC. Electronically Signed   By: Elon Alas M.D.   On: 06/01/2017 06:01    Anti-infectives: Anti-infectives (From admission, onward)   Start     Dose/Rate Route Frequency Ordered Stop   05/28/17 1200  vancomycin (VANCOCIN) 1,250 mg in sodium chloride 0.9 % 250 mL IVPB  Status:  Discontinued     1,250 mg 166.7 mL/hr over 90 Minutes Intravenous Every 24 hours 05/27/17 1324 05/30/17 1615   05/27/17 1100  vancomycin (VANCOCIN) 1,250 mg in sodium chloride 0.9 % 250 mL IVPB  Status:  Discontinued     1,250 mg 166.7 mL/hr over 90 Minutes Intravenous Every 12 hours 05/27/17 1011 05/27/17 1324   05/20/17 2230  vancomycin (VANCOCIN) 1,250 mg in sodium chloride 0.9 % 250 mL IVPB  Status:  Discontinued     1,250 mg 166.7 mL/hr over 90 Minutes Intravenous Every 24 hours 05/20/17 2151 05/27/17 1011   05/20/17 1400  piperacillin-tazobactam (ZOSYN) IVPB 3.375 g     3.375 g 12.5 mL/hr over 240 Minutes Intravenous Every 8 hours 05/20/17 1044     05/20/17 0500  anidulafungin (ERAXIS) 100 mg in sodium chloride 0.9 % 100 mL IVPB  Status:  Discontinued     100 mg 78 mL/hr over 100 Minutes Intravenous Every 24 hours 05/19/17 0116 05/29/17 1214   05/19/17 2200  vancomycin (VANCOCIN) 1,500 mg in sodium chloride 0.9 % 500 mL IVPB  Status:  Discontinued     1,500 mg 250 mL/hr over 120 Minutes Intravenous Every 24 hours 05/18/17 2126 05/20/17 2150   05/19/17 0130  anidulafungin (ERAXIS) 200 mg in sodium chloride 0.9 % 200 mL IVPB     200 mg 78 mL/hr over 200 Minutes Intravenous  Once 05/19/17 0116 05/19/17 0520   05/19/17 0130  Ampicillin-Sulbactam (UNASYN) 3 g in sodium chloride 0.9 % 100 mL IVPB  Status:  Discontinued     3 g 200 mL/hr over 30 Minutes Intravenous Every 8 hours 05/19/17 0118 05/20/17 1044   05/18/17 2200  vancomycin (VANCOCIN) 1,750 mg in sodium chloride 0.9 % 500 mL IVPB     1,750 mg 250 mL/hr over 120 Minutes Intravenous  Once 05/18/17 2126  05/19/17 0400   05/17/17 0930  piperacillin-tazobactam (ZOSYN) IVPB 3.375 g  Status:  Discontinued     3.375 g 12.5 mL/hr over 240 Minutes Intravenous Every 8 hours 05/17/17 0836 05/19/17 0118   05/11/17 2200  cefoTEtan (CEFOTAN) 2 g in sodium chloride 0.9 % 100 mL IVPB     2 g 200 mL/hr over 30 Minutes Intravenous Every 12 hours 05/11/17 1839 05/11/17 2034   05/11/17 0600  cefoTEtan in Dextrose 5% (CEFOTAN) IVPB 2 g     2 g Intravenous On call to O.R. 05/10/17 1142 05/11/17 1416      Assessment/Plan: s/p Procedure(s): Colon Resection Sigmoid Low Anterior Resection with Staple Colon Relast HERNIA REPAIR UMBILICAL ADULT Creation Vertical Loop ILEOSTOMY Continue aggressive care.  LOS: 27 days   Kathryne Eriksson. Dahlia Bailiff, MD, FACS [phone] [facility] Surgery 06/02/2017

## 2017-06-02 NOTE — Procedures (Signed)
Arterial Catheter Insertion Procedure Note Tyrion Glaude [id number] [birth year]  Procedure: Insertion of Arterial Catheter  Indications: Blood pressure monitoring  Procedure Details Consent: Unable to obtain consent because of altered level of consciousness. Time Out: Verified patient identification, verified procedure, site/side was marked, verified correct patient position, special equipment/implants available, medications/allergies/relevent history reviewed, required imaging and test results available.  Performed  Maximum sterile technique was used including antiseptics, cap, gloves, gown, hand hygiene, mask and sheet. Skin prep: Chlorhexidine; local anesthetic administered 22 gauge catheter was inserted into left radial artery using the Seldinger technique. ULTRASOUND GUIDANCE USED: NO Evaluation Blood flow good; BP tracing good. Complications: No apparent complications.   Vivien Rossetti 06/02/2017

## 2017-06-02 NOTE — Progress Notes (Addendum)
PULMONARY / CRITICAL CARE MEDICINE   Name: Jesse Le MRN: [id number] DOB: [birth year]    ADMISSION DATE:  05/06/2017 CONSULTATION DATE:  05/18/2017  REFERRING MD:  Dr. Terrence Dupont  CHIEF COMPLAINT:  Hypotension  HISTORY OF PRESENT ILLNESS:   72 yo male presented with abdominal pain, weight loss and intermittent hematochezia.  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  Had low anterior resection with primary anastomosis with diverting loop ileostomy.  Developed hypotension, hypoglycemia 5/07.  Developed hypotension and altered mental status 5/08 and transferred to ICU. Pt has an anastomotic leak, drain placed by surgery PMHx of CAD, DM, TAA.  5/13 The patient is awake and tracking but not really verbal. BP is running a bit high. 170-180. Patient remains ion a small dose of precedex at 0.62mg/kg. respiratotory status appears comfortable. Does not appear to be onpressors at this time.  5/14 Little overall change in status The patient rermains lethargic and non-communicative. BP has been high requiring nicardipine drip in addition to prn labetalol and hydralazine Discussed the situation with surgery. The patient had pulled out his RLQ drain. A new CT scan ihas been ordered and he may need drain replaced.  5/15 The patient is awake and up in the chair. He went down for a CT scan today. There was less extraluminal gas in the area of the anastomosis. There is increasing ascities. A new spontaneous right retroperitoneal hematoma was noted as well. The patient remains hemodynamically stable. He is more awake and speaking to his girfriend.  5/17 The patient is awake. He appears to be a little tearful. Whe he speaks it is vvery difficult for me to understand him. He apparently  Was a bit delirious overnight. He is still requiring Precedex in varying dose. We don't have the ability yest to substitute an oral agent .  5/18 The patient is awake and talkative. He is  being allowed to take clear liquids. His BP remains elevated He has a heart rate of about 50 likely related to precedex. The patient is on a carden e drip as well to keep his BP < 1009systolic. The patient remains onTPN.  5/19 The patient looks well. He is cooperative and communicative. No major compliants. He had a T max last night to about 100.8.  There is some concern as he has a persistent and worsening leukocytosis although he had clinically looked better the past few days.The patient did grow out Pseduomonas on paracentesis and is on Zosyn the past several days. I have taken the liberty of asking Dr. HJohnnye Simaof Infx disease to weigh in on the case. The patient was placed on Hydrocortisone a few days ago when he wass having intractable shock. His agitation is better. He is on Seroquel. precedex is being weaned off. The cardene has been turned off  5/20 PCCM signed off.  Debridement of necrotic fascia per Surgery.  On full liquid diet; poor intake  5/22 Re-consult for progressive hypoxia throughout the night and ongoing confusion, ~70's on RA, placed on NRB then to BiPAP.  CXR- rotated film worsening R multifocal consolidation, small pleural effusions.  Net +21 L.  Hypertensive, afebrile x 48 hours>> Intubated  VITAL SIGNS: BP 135/79   Pulse (!) 56   Temp 98 F (36.7 C) (Axillary)   Resp (!) 24   Ht '5\' 10"'$  (1.778 m)   Wt 282 lb 13.6 oz (128.3 kg)   SpO2 100%   BMI 40.58 kg/m   INTAKE /  OUTPUT: I/O last 3 completed shifts: In: 3040.3 [I.V.:2790.3; IV Piggyback:250] Out: 2510 [Urine:2170; Stool:340]  SUBJECTIVE: PRVC 60% FiO2, 10 peep On 5 mcg Levophed Lightly sedated on precedex gtt  ROS: Unable to obtain due to intubation/sedation  PHYSICAL EXAMINATION: General:  Actutely ill-appearing male, laying in bed, intubated, in no acute distress HEENT: Atraumatic, normocephalic, ETT in place, neck supple, MM pink/moist, Neuro: Sedated, drowsy, arouses to voice, follows  commands CV: RRR, bradycardia, No M/R/G PULM: Diffuse rhonchi on Right, left coarse throughout, symmetrical expansion, no assessory muscle use  GI: Abdominal binder in place, obese, Hypoactive BS, Ileostomy  Extremities: Anasarca, active ROM all extremties Skin: No obvious rashes, lesions, or ulcerations  LABS:  BMET Recent Labs  Lab 05/31/17 0720 06/01/17 0343 06/02/17 0500  NA 142 143 144  K 4.3 4.1 4.7  CL 110 112* 114*  CO2 '24 22 22  '$ BUN 27* 27* 41*  CREATININE 1.22 1.32* 1.75*  GLUCOSE 131* 206* 217*    Electrolytes Recent Labs  Lab 05/31/17 0720 06/01/17 0343 06/02/17 0500  CALCIUM 7.8* 7.7* 7.5*  MG 2.0 2.1 2.3  PHOS 3.3 3.1 5.3*    CBC Recent Labs  Lab 05/29/17 0420 05/30/17 0344 05/31/17 0428  WBC 25.9* 25.3* 19.7*  HGB 9.2* 8.9* 9.1*  HCT 29.3* 30.8* 32.4*  PLT 300 268 247    Coag's No results for input(s): APTT, INR in the last 168 hours.  Sepsis Markers No results for input(s): LATICACIDVEN, PROCALCITON, O2SATVEN in the last 168 hours.  ABG Recent Labs  Lab 06/01/17 0628 06/01/17 0916 06/01/17 1403  PHART 7.412 7.111* 7.338*  PCO2ART 31.4* 66.8* 44.7  PO2ART 77.0* 69.3* 96.0    Liver Enzymes Recent Labs  Lab 05/30/17 0637 06/02/17 0500  AST 47* 33  ALT 66* 38  ALKPHOS 269* 126  BILITOT 1.4* 1.2  ALBUMIN 1.2* 1.2*    Cardiac Enzymes No results for input(s): TROPONINI, PROBNP in the last 168 hours.  Glucose Recent Labs  Lab 06/01/17 1305 06/01/17 1628 06/01/17 2000 06/01/17 2357 06/02/17 0338 06/02/17 0744  GLUCAP 163* 138* 88 88 145* 239*    Imaging Dg Chest Port 1 View  Addendum Date: 06/01/2017   ADDENDUM REPORT: 06/01/2017 17:27 ADDENDUM: Right internal jugular central line tip is at the cavoatrial junction. Right PICC line tip is in the lower SVC. Electronically Signed   By: Rolm Baptise M.D.   On: 06/01/2017 17:27   Result Date: 06/01/2017 CLINICAL DATA:  Status post central line placement EXAM: PORTABLE  CHEST 1 VIEW COMPARISON:  06/01/2017 FINDINGS: Endotracheal tube and feeding catheter in satisfactory position. Aortic stent graft is again identified and stable. Right jugular central line and right-sided PICC line are noted in satisfactory position. No pneumothorax is noted. Left lung is well aerated with minimal left basilar atelectasis. Increasing right upper lobe infiltrate is noted. IMPRESSION: Increasing right upper lobe infiltrate. No pneumothorax following central line placement. The remainder the exam is stable. Electronically Signed: By: Inez Catalina M.D. On: 06/01/2017 13:26   Portable Chest X-ray  Result Date: 06/01/2017 CLINICAL DATA:  Endotracheal tube placement. EXAM: PORTABLE CHEST 1 VIEW COMPARISON:  06/01/2017 and CT chest 05/06/2017. FINDINGS: Endotracheal tube terminates approximately 4.2 cm above the carina. Right PICC tip is in the low SVC. Descending thoracic aortic aneurysm with indwelling thoracoabdominal stent. Patchy airspace consolidation throughout the right lung with a right pleural effusion. Minimal left basilar atelectasis. IMPRESSION: 1. Endotracheal tube is in satisfactory position. 2. Patchy airspace opacification throughout the right  lung may be due to pneumonia. Right pleural effusion. Electronically Signed   By: Lorin Picket M.D.   On: 06/01/2017 10:07   STUDIES:  CT abd 5/09 >> discontinuity in the posterior wall just above the anastomosis with feculent appearing material and gas in the pelvis, measuring up to 5x 3 cm anterior and superior to the sigmoid colon.  Fluid collection right lower quadrant measuring up to 6.4 x 3.5 cm. Moderate pneumoperitoneum.   CULTURES: MRSA PCR 5/8 >> neg Blood culture 5/8 >> neg Abd ascess 5/16 >> pseudomonas > pan sensitive Blood culture 5/22>> Urine culture 5/22>> No growth Bronchial alveolar lavage 5/22>>  ANTIBIOTICS: Zosyn 5/7 > 5/8 Zosyn 5/10>> Unasyn 5/8 > 5/10 Vancomycin 5/7 >5/20 Eraxis 5/8 >  5/18  LINES/TUBES: Lt IJ CVL  5/8 > 5/10 JP Drain 5/1> RUE PICC 5/10 >>5/23 Right IJ CVC 5/22>>  DISCUSSION: 72 yo male with sepsis from peritonitis after surgery for colon cancer and CT abd +  for anastomotic leak.>> Drain placed 5/1.  Pt with acute hypoxic respiratory failure on 5/22 requiring intubation.  Concern for Aspiration.  ASSESSMENT / PLAN:  Acute Hypoxic respiratory insufficiency 2/2 R lung consolidation HAP vs aspiration pneumonitis  - Intubated 5/22 -Full vent support, TV 8 cc/kg, wean FiO2 and Peep as tolerated -Maintain O2 sats>92% -SBT when parameters met -VAP bundle -Pulmonary hygiene -NPO for now -Continue Zosyn -Considering broadening Abx coverage with addition of Vancomycin (Aspiration Vs HAP), but will     hold for now 5/23;  follow Bronch lavage cultures as pt remains afebrile and leukocytosis trending down -Follow CXR  Sepsis with septic shock from peritonitis- resolved  Abd abscess w/ pseudomonas  Colon cancer s/p low anterior resection with ileostomy w/anastomosis leak  UA w/ +Leukocytes & many bacteria>> UC negative on 5/23 -Continue abx per ID- continue Zosyn -Follow pan cultures from 5/22  -Surgery following, appreciate input -Ongoing TPN -Monitor fever curve -Follow CBC -Per ID recommendation, CVC placed 5/22, PICC line removed 5/23  Acute renal failure from ATN.   -Follow I&O's  -Net 21L with Anasarca -Will hold off on further diuresis at this time while on vasopressors -Serum Cr continues to increase, but UOP remains adequate -Trend BMET/mag/phos daily -Avoid nephrotoxic agents as able  Anemia -Monitor for S/sx of bleeding -Follow CBC -Transfuse for Hbg <7     DM type II. Hypoglycemia in setting of sepsis- resolved now hyperglycemic  -CBG q4h -SSI -Follow ICU hyperglycemic protocol   Acute metabolic encephalopathy 2nd to sepsis- ongoing RASS goal 0 to -1 Precedex gtt to maintain RASS goal Avoid sedating medications as  able Daily WUA Provide supportive care  Hypertension>>resolved, now hypotensive likely in setting of vent/sedation Bradycardia>>likely in setting or precedex gtt -Hold Lopressor 25 mg BID on 5/23  -Levophed as needed to maintain MAP >65 -Use lowest dose of precedex as able to maintain RASS goal -Follow fever curve and Pan cultures (performed 5/22)   VTE prophylaxis -SCD's only (given hx of retroperitoneal bleed) SUP- Pepcid mixed in TPN Nutrition - NPO +TPN+ TF Goals of care- Full code  Prognosis is guarded  Family- No family present at bedside 5/23.  Darel Hong, AGACNP-BC [facility] 06/02/2017, 9:03 AM

## 2017-06-02 NOTE — Progress Notes (Signed)
PHARMACY - ADULT TOTAL PARENTERAL NUTRITION CONSULT NOTE   Pharmacy Consult:  TPN Indication: Prolonged ileus  Patient Measurements: Height: _0  (177.8 cm) Weight: 282 lb 13.6 oz (128.3 kg) IBW/kg (Calculated) : 73 TPN AdjBW (KG): 94.5 Body mass index is 40.58 kg/m.  Assessment:  39 YOM with PMH significant for IDA with recent rectal bleed, PVD, CAD, HTN, HLD, COPD, and CKD, found to have rectosigmoid colon adenocarcinoma mass s/p lower anterior resection with primary colorectal anastomosis and diverting loop ileostomy with umbilical hernia repair on 05/11/17.  Developed anastomotic leak with feculent output in JP drain. Patient currently with high ileostomy output. Patient's abdomen has become more distended and patient was made NPO. Pharmacy consulted for TPN.   Patient was on clear liquids (on and off NPO as well) but only tolerating minimal intake (<25%) for >10 days. Patient is at risk for refeeding.   GI: abd wound dehiscence. Prealbumin <5 - now up to 21.7, 340 ml ileostomy O/P, drain O/P 104m. Pepcid in TPN. LBM 5/21. Started soft diet 5/20 but back to full liquids, as pt did not want to eat. Cortrak placed 5/22, tube feeds ordered 5/23 - started at goal this AM but patient only received ~127m before going for stat CT Endo: no hx DM - hypoglycemic prior to TPN. Off steroids. CBGs fluctuating 88-239, ?stress-related. Previously controlled until 5/22 Insulin requirements in the past 24 hours: 13 units SSI + 15 units in TPN Lytes: K up from 4.1>4.7 after reduced in TPN (due to AKI); Cl up to 114 / CO2 WNL, Phos now high 3.1>5.3 after no change (Ca x Phos ~53), others WNL Renal: AKI - SCr up to 1.75, BUN up to 41 - UOP 0.4 ml/kg/hr, +21.1 L since admit Pulm: [place] > intubated 5/22 with worsening respiratory status; FiO2 60% Cards: Norepi gtt started 5/23 for low BP (weaning). HR 50s - holding Lopressor. lasix x 1 on 5/22 Hepatobil: LFTs / Tbili normalized, TG WNL Heme: CT shows  spontaneous RTP hematoma, holding heparin - low but stable, plts WNL Neuro: Precedex gtt resumed - ongoing agitation. PRN fent/versed. GCS 10, RASS 2 (goal -2), CPOT 1 ID: Zosyn for abdominal abscess due to pan-sensitive pseudomonas - ID recommends 7 more days from 5/21, Changing central line 5/22. BAL cx and BCx pending. Afebrile, WBC down to 19.7 TPN Access: CVC triple lumen placed 05/18/17 TPN start date: 05/20/17  Nutritional Goals (per RD rec updated on 5/22): 2194 kCal, 105-120g protein, 2.1- 2.4 L per day  Current Nutrition:  TPN Prostat 3062mID + Pivot 1.5 at goal 55 ml/hr - will provide 2180 kCal + 119g protein  Plan:  Continue concentrated TPN at 70 ml/hr, providing 116g AA, 370g CHO and 52g ILE for a total of 2242 kCal, meeting 100% of patient's needs. Electrolytes in TPN: reduce K and remove phos 5/23 with SCr bump; Cl:Ac to max acetate Daily multivitamin and trace elements in TPN Continue SSI moderate Q4H + regular insulin 15 units added to TPN - will not adjust for now, manage with SSI and monitor CBG trend tomorrow Pepcid 102m5mily in TPN Vitamin C 500mg78m Zinc 5mg a69md to TPN to help with wound healing (started 5/18) F/U AM labs, toleration of tube feeds and ability to wean TPN - no wean today per discussion with Trauma until sure patient tolerating tube feeds   Alphus Zeck Elicia LampmD, BCPS Clinical Pharmacist Clinical phone for 06/02/2017 until 3:30pm: [phone] 3:30pm, please call main pharmacy at: [phone]  06/02/2017 9:30 AM

## 2017-06-02 NOTE — Progress Notes (Signed)
Pt transported to/from CT scan w/ no apparent complications.

## 2017-06-03 ENCOUNTER — Inpatient Hospital Stay (HOSPITAL_COMMUNITY): Payer: Medicare Other

## 2017-06-03 DIAGNOSIS — K9189 Other postprocedural complications and disorders of digestive system: Secondary | ICD-10-CM

## 2017-06-03 DIAGNOSIS — Z9911 Dependence on respirator [ventilator] status: Secondary | ICD-10-CM

## 2017-06-03 LAB — GLUCOSE, CAPILLARY
GLUCOSE-CAPILLARY: 257 mg/dL — AB (ref 65–99)
GLUCOSE-CAPILLARY: 161 mg/dL — AB (ref 65–99)
Glucose-Capillary: 160 mg/dL — ABNORMAL HIGH (ref 65–99)
Glucose-Capillary: 161 mg/dL — ABNORMAL HIGH (ref 65–99)
Glucose-Capillary: 192 mg/dL — ABNORMAL HIGH (ref 65–99)
Glucose-Capillary: 200 mg/dL — ABNORMAL HIGH (ref 65–99)

## 2017-06-03 LAB — BASIC METABOLIC PANEL
Anion gap: 9 (ref 5–15)
BUN: 47 mg/dL — AB (ref 6–20)
CALCIUM: 7.5 mg/dL — AB (ref 8.9–10.3)
CO2: 21 mmol/L — AB (ref 22–32)
CREATININE: 1.66 mg/dL — AB (ref 0.61–1.24)
Chloride: 114 mmol/L — ABNORMAL HIGH (ref 101–111)
GFR calc non Af Amer: 40 mL/min — ABNORMAL LOW (ref >60)
GFR, EST AFRICAN AMERICAN: 46 mL/min — AB (ref >60)
Glucose, Bld: 270 mg/dL — ABNORMAL HIGH (ref 65–99)
Potassium: 4.4 mmol/L (ref 3.5–5.1)
SODIUM: 144 mmol/L (ref 135–145)

## 2017-06-03 LAB — CBC
HEMATOCRIT: 26.7 % — AB (ref 39.0–52.0)
Hemoglobin: 7.9 g/dL — ABNORMAL LOW (ref 13.0–17.0)
MCH: 25.7 pg — AB (ref 26.0–34.0)
MCHC: 29.6 g/dL — AB (ref 30.0–36.0)
MCV: 87 fL (ref 78.0–100.0)
Platelets: 241 10*3/uL (ref 150–400)
RBC: 3.07 MIL/uL — ABNORMAL LOW (ref 4.22–5.81)
RDW: 26.4 % — AB (ref 11.5–15.5)
WBC: 12.1 10*3/uL — ABNORMAL HIGH (ref 4.0–10.5)

## 2017-06-03 LAB — MAGNESIUM: Magnesium: 2.4 mg/dL (ref 1.7–2.4)

## 2017-06-03 LAB — PHOSPHORUS: PHOSPHORUS: 3.3 mg/dL (ref 2.5–4.6)

## 2017-06-03 MED ORDER — FAMOTIDINE 40 MG/5ML PO SUSR
40.0000 mg | Freq: Every day | ORAL | Status: DC
  Administered 2017-06-04 – 2017-06-15 (×9): 40 mg
  Filled 2017-06-03 (×13): qty 5

## 2017-06-03 MED ORDER — ACETAMINOPHEN 160 MG/5ML PO SOLN
650.0000 mg | Freq: Three times a day (TID) | ORAL | Status: DC | PRN
  Administered 2017-06-03 – 2017-06-18 (×5): 650 mg via ORAL
  Filled 2017-06-03 (×5): qty 20.3

## 2017-06-03 MED ORDER — VITAMIN C 500 MG PO TABS
500.0000 mg | ORAL_TABLET | Freq: Every day | ORAL | Status: DC
  Administered 2017-06-04 – 2017-06-15 (×8): 500 mg
  Filled 2017-06-03 (×8): qty 1

## 2017-06-03 NOTE — Progress Notes (Signed)
PULMONARY / CRITICAL CARE MEDICINE   Name: Jesse Le MRN: [id number] DOB: 1945-10-20    ADMISSION DATE:  05/06/2017 CONSULTATION DATE:  05/18/2017  REFERRING MD:  Dr. Terrence Dupont  CHIEF COMPLAINT:  Hypotension  HISTORY OF PRESENT ILLNESS:   72 yo male presented with abdominal pain, weight loss and intermittent hematochezia.  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  Had low anterior resection with primary anastomosis with diverting loop ileostomy.  Developed hypotension, hypoglycemia 5/07.  Developed hypotension and altered mental status 5/08 and transferred to ICU. Pt has an anastomotic leak, drain placed by surgery PMHx of CAD, DM, TAA.  5/22 Re-consult for progressive hypoxia throughout the night and ongoing confusion, ~70's on RA, placed on NRB then to BiPAP.  CXR- rotated film worsening R multifocal consolidation, small pleural effusions.  Net +21 L.  Hypertensive, afebrile x 48 hours>> Intubated  Subjective: Off norepi this am, now back on low dose Stable from resp standpoint  VITAL SIGNS: BP (!) 150/56   Pulse (!) 59   Temp 98.6 F (37 C) (Oral)   Resp (!) 27   Ht 5\' 10"  (1.778 m)   Wt 121.6 kg (268 lb 1.3 oz)   SpO2 100%   BMI 38.47 kg/m   INTAKE / OUTPUT: I/O last 3 completed shifts: In: 4666.2 [I.V.:3459.2; Other:5; NG/GT:1001.9; IV Piggyback:200] Out: 1829 [Urine:1425; Drains:360; Stool:25]  PHYSICAL EXAMINATION: General:  Actutely ill-appearing male, laying in bed, intubated, in no acute distress HEENT: Atraumatic, normocephalic, ETT in place, neck supple, MM pink/moist, Neuro: Sedated, drowsy, arouses to voice, follows commands CV: Regular, borderline bradycardia, no murmur PULM: Coarse breath sounds bilaterally GI: Ileostomy, drain in place, abdominal binder in place, hypoactive bowel sounds Extremities: Diffuse edema Skin: No obvious rashes, lesions, or ulcerations  LABS:  BMET Recent Labs  Lab 06/01/17 0343  06/02/17 0500 06/03/17 0435  NA 143 144 144  K 4.1 4.7 4.4  CL 112* 114* 114*  CO2 22 22 21*  BUN 27* 41* 47*  CREATININE 1.32* 1.75* 1.66*  GLUCOSE 206* 217* 270*    Electrolytes Recent Labs  Lab 06/01/17 0343 06/02/17 0500 06/03/17 0435  CALCIUM 7.7* 7.5* 7.5*  MG 2.1 2.3 2.4  PHOS 3.1 5.3* 3.3    CBC Recent Labs  Lab 05/31/17 0428 06/02/17 0500 06/03/17 0435  WBC 19.7* 17.4* 12.1*  HGB 9.1* 8.2* 7.9*  HCT 32.4* 28.1* 26.7*  PLT 247 246 241    Coag's No results for input(s): APTT, INR in the last 168 hours.  Sepsis Markers No results for input(s): LATICACIDVEN, PROCALCITON, O2SATVEN in the last 168 hours.  ABG Recent Labs  Lab 06/01/17 0628 06/01/17 0916 06/01/17 1403  PHART 7.412 7.111* 7.338*  PCO2ART 31.4* 66.8* 44.7  PO2ART 77.0* 69.3* 96.0    Liver Enzymes Recent Labs  Lab 05/30/17 0637 06/02/17 0500  AST 47* 33  ALT 66* 38  ALKPHOS 269* 126  BILITOT 1.4* 1.2  ALBUMIN 1.2* 1.2*    Cardiac Enzymes No results for input(s): TROPONINI, PROBNP in the last 168 hours.  Glucose Recent Labs  Lab 06/02/17 1557 06/02/17 2041 06/02/17 2351 06/03/17 0416 06/03/17 0823 06/03/17 1200  GLUCAP 125* 140* 160* 257* 200* 161*    Imaging Ct Abdomen Pelvis Wo Contrast  Result Date: 06/02/2017 CLINICAL DATA:  History of colon cancer, post LAR with primary colorectal anastomosis and diverting loop ileostomy with umbilical hernia repair on 05/11/2017 by Dr. Georgette Dover found to have postoperative pneumoperitoneum and perihepatic fluid secondary  to anastomotic leak on abdominal CT performed 05/25/2017. Percutaneous drain placed in the right perihepatic fluid collection on 05/26/2017. EXAM: CT ABDOMEN AND PELVIS WITHOUT CONTRAST TECHNIQUE: Multidetector CT imaging of the abdomen and pelvis was performed following the standard protocol without IV contrast. 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 The surgical changes are incompletely imaged and incompletely evaluated in the absence of intravenous contrast material. Small right-sided pleural effusion with associated right lower lobe atelectasis. The lungs are otherwise clear. The heart is within normal limits for size. No pericardial effusion. Hepatobiliary: Grossly normal hepatic contour and morphology. Stones are present within the gallbladder lumen. No intra or extrahepatic biliary ductal dilatation. Persistent perihepatic fluid collection despite the well-positioned drainage catheter. Using the coronal and sagittal reformatted images, the fluid collection measures approximately 24 x 12 by 16 cm in diameter and extends inferiorly and anteriorly communicating with a large air and gas filled pocket which extends across the entire anterior aspect of the abdomen. The margins of the fluid collection are well-defined and convex. Pancreas: Unremarkable. No pancreatic ductal dilatation or surrounding inflammatory changes. Spleen: Normal in size without focal abnormality. Adrenals/Urinary Tract: The adrenal glands are unremarkable. A trophic left kidney. No evidence of right-sided hydronephrosis or nephrolithiasis. Stomach/Bowel: A weighted tip enteric feeding tube is present within the stomach. No evidence of bowel obstruction. Surgical changes of prior low anterior resection with rectal colonic anastomosis and a diverting right lower quadrant loop ileostomy. Colonic diverticular disease without CT evidence of active inflammation. Vascular/Lymphatic: Incompletely imaged in the absence of intravenous contrast. More multiple a fenestrated endovascular abdominal aortic repair. No suspicious lymphadenopathy. Reproductive: Prostate is unremarkable. Other: Improving right psoas muscle hematoma. The psoas is less expanded and there  is less high attenuation material within the peri psoas retroperitoneal fat. Enlarging loculated fluid collection in the right lower quadrant extending into the small bowel mesentery which measures approximately 12.6 x 7.5 cm. This may communicate with the inferior margin of the perihepatic and upper/mid abdominal fluid collection as well. Musculoskeletal: No acute fracture or aggressive appearing lytic or blastic osseous lesion. Multilevel degenerative disc disease. Open midline surgical incision. IMPRESSION: 1. Progressive multifocal loculated fluid and gas collections throughout much of the peritoneal space compared to 05/25/2017 despite the well-positioned right perihepatic percutaneous drainage catheter suggesting persistent anastomotic leak and/or peritoneal abscess. A very large fluid and gas collection now extends from the right subdiaphragmatic space inferiorly and laterally spanning across the entirety of the upper abdomen. There is a second large fluid collection in the left lower quadrant extending into the small bowel mesenteries which has also enlarged compared to prior. There may be some communication between these 2 collections. 2. The tip of the enteric feeding tube is present within the stomach. 3. Improving right psoas hematoma. 4. Small right-sided pleural effusion with associated right lower lobe atelectasis is likely reactive. 5. Additional ancillary findings as above without significant interval change. Electronically Signed   By: Jacqulynn Cadet M.D.   On: 06/02/2017 13:26   Dg Chest Port 1 View  Result Date: 06/03/2017 CLINICAL DATA:  Check endotracheal tube placement EXAM: PORTABLE CHEST 1 VIEW COMPARISON:  06/02/2017 FINDINGS: Aortic stent graft is again noted and stable. Endotracheal tube, feeding catheter and right jugular central line are again noted and stable. The right-sided PICC line has been removed in the  interval. Diffuse right upper lobe infiltrate is again identified and  stable from the prior exam. No acute bony abnormality is seen. IMPRESSION: Stable right upper lobe infiltrate. Electronically Signed   By: Inez Catalina M.D.   On: 06/03/2017 07:44   STUDIES:  CT abd 5/09 >> discontinuity in the posterior wall just above the anastomosis with feculent appearing material and gas in the pelvis, measuring up to 5x 3 cm anterior and superior to the sigmoid colon.  Fluid collection right lower quadrant measuring up to 6.4 x 3.5 cm. Moderate pneumoperitoneum.   CULTURES: MRSA PCR 5/8 >> neg Blood culture 5/8 >> neg Abd ascess 5/16 >> pseudomonas > pan sensitive Blood culture 5/22>> Urine culture 5/22>> No growth Bronchial alveolar lavage 5/22>>  ANTIBIOTICS: Zosyn 5/7 > 5/8 Zosyn 5/10>> Unasyn 5/8 > 5/10 Vancomycin 5/7 >5/20 Eraxis 5/8 > 5/18  LINES/TUBES: Lt IJ CVL  5/8 > 5/10 JP Drain 5/1> RUE PICC 5/10 >>5/23 Right IJ CVC 5/22>>  DISCUSSION: 72 yo male with sepsis from peritonitis after surgery for colon cancer and CT abd +  for anastomotic leak.>> Drain placed 5/1.  Pt with acute hypoxic respiratory failure on 5/22 requiring intubation.  Concern for Aspiration.  ASSESSMENT / PLAN:  Acute Hypoxic respiratory insufficiency 2/2 R lung consolidation HAP vs aspiration pneumonitis  Continue PRVC 8 cc/kg, wean PEEP from 10 to 5 5/24 VAP prevention bundle Pulmonary hygiene Zosyn as below Push spontaneous breathing trials once he tolerates lower PEEP Question whether he may require tracheostomy to get through this  Sepsis with septic shock from peritonitis- resolved  Abd abscess w/ pseudomonas  Colon cancer s/p low anterior resection with ileostomy w/anastomosis leak  UA w/ +Leukocytes & many bacteria>> UC negative on 5/23 Probable aspiration pneumonia Continue Zosyn given concern for new pulmonary infection Follow culture data Abdominal drain in place, surgery following  Acute renal failure from ATN.   Diuresis as he can tolerate, hematin Amick  instability has precluded this recently Follow urine output, serum creatinine, electrolytes Avoid nephrotoxins  Anemia Follow CBC Transfusion threshold hemoglobin < 7     DM type II. Hypoglycemia in setting of sepsis- resolved now hyperglycemic  ICU hyperglycemia protocol Sliding scale insulin  Acute metabolic encephalopathy 2nd to sepsis- ongoing RASS goal 0 to -1 Continue Precedex drip Avoid other sedating medications if able Daily wake up assessment  Hypertension>>resolved, now hypotensive likely in setting of vent/sedation Bradycardia>>likely in setting or precedex gtt Wean norepinephrine to off as able Metoprolol on hold Minimize sedation which is likely contributing to his hypotension   VTE prophylaxis -SCD's only (given hx of retroperitoneal bleed) SUP-Pepcid Nutrition -transitioning TPN to tube feeding since he has tolerated Goals of care- Full code  Prognosis is guarded  Family- No family present at bedside 5/24   Baltazar Apo, MD, PhD 06/03/2017, 2:14 PM [facility] [phone] or if no answer [phone]

## 2017-06-03 NOTE — Progress Notes (Signed)
Notified that condom cath has been removed by Cassie Freer

## 2017-06-03 NOTE — Progress Notes (Signed)
[place] [facility]    Date of Admission:  05/06/2017   Total days of antibiotics 18        Day 18 piptazo           ID: Jesse Le is a 72 y.o. male with  Principal Problem:   Cancer of sigmoid s/p LAR rectosigmoid resection 05/11/2017 Active Problems:   Generalized abdominal pain   Iron deficiency anemia due to chronic blood loss   Abnormal CT scan, sigmoid colon   Acute blood loss anemia   Ventral hernia s/p primary repair 05/11/2017   Physical deconditioning   Diabetes mellitus without complication (HCC)   CAD (coronary atherosclerotic disease)   Septic shock (HCC)   Hypoglycemia after GI (gastrointestinal) surgery   Colostomy present ([place])   Acute respiratory failure with hypoxemia (HCC)   Atelectasis    Subjective: Remains intubated. Off of pressors, had abd ct showing large fluid collection measuring 24 x 12 x 16 cm  And lasrge RLQ loculated fluid collection measuring 12.6 by 7.5cm concerning for anastomotic leak  Medications:  . chlorhexidine gluconate (MEDLINE KIT)  15 mL Mouth Rinse BID  . Chlorhexidine Gluconate Cloth  6 each Topical Daily  . [START ON 06/04/2017] famotidine  40 mg Per Tube Daily  . feeding supplement (PRO-STAT SUGAR FREE 64)  30 mL Per Tube BID  . insulin aspart  0-15 Units Subcutaneous Q4H  . lip balm  1 application Topical BID  . mouth rinse  15 mL Mouth Rinse 10 times per day  . sodium chloride flush  5 mL Intracatheter Q8H  . [START ON 06/04/2017] vitamin C  500 mg Per Tube Daily    Objective: Vital signs in last 24 hours: Temp:  [98 F (36.7 C)-99.9 F (37.7 C)] 99.9 F (37.7 C) (05/24 1315) Pulse Rate:  [46-89] 46 (05/24 1315) Resp:  [9-29] 25 (05/24 1315) BP: (84-150)/(42-96) 86/60 (05/24 1300) SpO2:  [73 %-100 %] 100 % (05/24 1315) Arterial Line BP: (113-222)/(36-75) 222/75 (05/24 1315) FiO2 (%):  [40 %-50 %] 40 % (05/24 1300) Weight:  [268 lb 1.3 oz (121.6 kg)-268 lb 8.3 oz (121.8 kg)] 268 lb 1.3 oz (121.6  kg) (05/24 0431) Physical Exam  Constitutional: sedated, intubated. He appears well-developed and well-nourished. No distress.  HENT:  Mouth/Throat: OETT in place Cardiovascular: Normal rate, regular rhythm and normal heart sounds. Exam reveals no gallop and no friction rub.  No murmur heard.  Pulmonary/Chest: Effort normal and breath sounds normal. No respiratory distress. He has no wheezes.  Abdominal: Soft. Bowel sounds are decreased. Right perihepatic drain yellowish fluid in drain. Large midline abdominal defect, right lateral edge greenish/bluish exudate. 80% wound bed is pink Ext: diffuse anasarca, cool feet    Lab Results Recent Labs    06/02/17 0500 06/03/17 0435  WBC 17.4* 12.1*  HGB 8.2* 7.9*  HCT 28.1* 26.7*  NA 144 144  K 4.7 4.4  CL 114* 114*  CO2 22 21*  BUN 41* 47*  CREATININE 1.75* 1.66*   Liver Panel Recent Labs    06/02/17 0500  PROT 5.5*  ALBUMIN 1.2*  AST 33  ALT 38  ALKPHOS 126  BILITOT 1.2    Microbiology: 5/22 blood cx ngtd 5/22 bal is yeast 5/16 abd fluid cx -pansensitive PsA Studies/Results: Ct Abdomen Pelvis Wo Contrast  Result Date: 06/02/2017 CLINICAL DATA:  History of colon cancer, post LAR with primary colorectal anastomosis and diverting loop ileostomy with umbilical hernia repair on 05/11/2017 by  Dr. Georgette Dover found to have postoperative pneumoperitoneum and perihepatic fluid secondary to anastomotic leak on abdominal CT performed 05/25/2017. Percutaneous drain placed in the right perihepatic fluid collection on 05/26/2017. EXAM: CT ABDOMEN AND PELVIS WITHOUT CONTRAST TECHNIQUE: Multidetector CT imaging of the abdomen and pelvis was performed following the standard protocol without IV contrast. 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  The surgical changes are incompletely imaged and incompletely evaluated in the absence of intravenous contrast material. Small right-sided pleural effusion with associated right lower lobe atelectasis. The lungs are otherwise clear. The heart is within normal limits for size. No pericardial effusion. Hepatobiliary: Grossly normal hepatic contour and morphology. Stones are present within the gallbladder lumen. No intra or extrahepatic biliary ductal dilatation. Persistent perihepatic fluid collection despite the well-positioned drainage catheter. Using the coronal and sagittal reformatted images, the fluid collection measures approximately 24 x 12 by 16 cm in diameter and extends inferiorly and anteriorly communicating with a large air and gas filled pocket which extends across the entire anterior aspect of the abdomen. The margins of the fluid collection are well-defined and convex. Pancreas: Unremarkable. No pancreatic ductal dilatation or surrounding inflammatory changes. Spleen: Normal in size without focal abnormality. Adrenals/Urinary Tract: The adrenal glands are unremarkable. A trophic left kidney. No evidence of right-sided hydronephrosis or nephrolithiasis. Stomach/Bowel: A weighted tip enteric feeding tube is present within the stomach. No evidence of bowel obstruction. Surgical changes of prior low anterior resection with rectal colonic anastomosis and a diverting right lower quadrant loop ileostomy. Colonic diverticular disease without CT evidence of active inflammation. Vascular/Lymphatic: Incompletely imaged in the absence of intravenous contrast. More multiple a fenestrated endovascular abdominal aortic repair. No suspicious lymphadenopathy. Reproductive: Prostate is unremarkable. Other: Improving right psoas muscle hematoma. The psoas is less expanded and there is less high attenuation material within the peri psoas retroperitoneal fat. Enlarging loculated fluid collection in the right lower quadrant  extending into the small bowel mesentery which measures approximately 12.6 x 7.5 cm. This may communicate with the inferior margin of the perihepatic and upper/mid abdominal fluid collection as well. Musculoskeletal: No acute fracture or aggressive appearing lytic or blastic osseous lesion. Multilevel degenerative disc disease. Open midline surgical incision. IMPRESSION: 1. Progressive multifocal loculated fluid and gas collections throughout much of the peritoneal space compared to 05/25/2017 despite the well-positioned right perihepatic percutaneous drainage catheter suggesting persistent anastomotic leak and/or peritoneal abscess. A very large fluid and gas collection now extends from the right subdiaphragmatic space inferiorly and laterally spanning across the entirety of the upper abdomen. There is a second large fluid collection in the left lower quadrant extending into the small bowel mesenteries which has also enlarged compared to prior. There may be some communication between these 2 collections. 2. The tip of the enteric feeding tube is present within the stomach. 3. Improving right psoas hematoma. 4. Small right-sided pleural effusion with associated right lower lobe atelectasis is likely reactive. 5. Additional ancillary findings as above without significant interval change. Electronically Signed   By: Jacqulynn Cadet M.D.   On: 06/02/2017 13:26   Dg Chest Port 1 View  Result Date: 06/03/2017 CLINICAL DATA:  Check endotracheal tube placement EXAM: PORTABLE CHEST 1 VIEW COMPARISON:  06/02/2017 FINDINGS: Aortic stent graft is again noted and stable. Endotracheal tube, feeding catheter and right jugular central line are again noted  and stable. The right-sided PICC line has been removed in the interval. Diffuse right upper lobe infiltrate is again identified and stable from the prior exam. No acute bony abnormality is seen. IMPRESSION: Stable right upper lobe infiltrate. Electronically Signed   By:  Inez Catalina M.D.   On: 06/03/2017 07:44   Dg Chest Port 1 View  Result Date: 06/02/2017 CLINICAL DATA:  Acute respiratory failure with hypoxemia. Endotracheal tube present. EXAM: PORTABLE CHEST 1 VIEW COMPARISON:  06/01/2017 FINDINGS: Endotracheal tube is appropriately positioned, measuring 3.9 cm above the carina. Feeding tube extends into the upper abdomen but the tip is not visualized. Again noted is an aortic stent graft involving the aortic arch and descending thoracic aorta and thoracoabdominal aorta. Density around the stent graft compatible with the thoracic aortic aneurysm sac. Persistent airspace disease in the right upper lung has minimally changed. PICC line tip and right jugular central venous catheter are both in the lower SVC region. Cardiac silhouette is stable for size but poorly characterized due to the enlarged thoracic aorta. IMPRESSION: No significant change in the airspace disease within the right upper lung. Findings are suggestive for pneumonia. Support apparatuses are appropriately positioned as described. Incomplete evaluation of the feeding tube. Electronically Signed   By: Markus Daft M.D.   On: 06/02/2017 09:31     Assessment/Plan: Large intra-abdominal fluid collection concerning for abscess 2/2 anastomosis leak = patient currently on piptazo. likley has fever and leukocytosis due to source control. Defer to general surgery for intervention for reassessing for leak, vs IR placing further drains  Dr Lucianne Lei dam available for questions over the weekend.  [facility] Cell: [phone] Pager: [phone]  06/03/2017, 1:48 PM

## 2017-06-03 NOTE — Progress Notes (Signed)
Pt temp 38.2 C, Trama MD on-call notified. Orders placed for tylenol and UA collection. Will continue to monitor.

## 2017-06-03 NOTE — Progress Notes (Signed)
d        Dr. Hulen Skains with General Surgery and Wound care nurse at bedside to assess condom cath, penile and scrotal wounds.  Updated on output.  New orders received and noted.

## 2017-06-03 NOTE — Progress Notes (Signed)
Subjective:  Patient remains sedated, intubated patient is off inotropic remains very edematous albumin remains very low  Objective:  Vital Signs in the last 24 hours: Temp:  [97.6 F (36.4 C)-99.1 F (37.3 C)] 98.6 F (37 C) (05/24 0818) Pulse Rate:  [50-89] 59 (05/24 0729) Resp:  [9-30] 27 (05/24 0729) BP: (84-143)/(42-96) 119/89 (05/24 0818) SpO2:  [73 %-100 %] 100 % (05/24 0832) Arterial Line BP: (118-202)/(36-60) 124/39 (05/24 0729) FiO2 (%):  [40 %-50 %] 40 % (05/24 0832) Weight:  [121.6 kg (268 lb 1.3 oz)-121.8 kg (268 lb 8.3 oz)] 121.6 kg (268 lb 1.3 oz) (05/24 0431)  Intake/Output from previous day: 05/23 0701 - 05/24 0700 In: 3493.7 [I.V.:2386.8; NG/GT:1001.9; IV Piggyback:100] Out: 1610 [Urine:1225; Drains:360; Stool:25] Intake/Output from this shift: Total I/O In: -  Out: 750 [Urine:175; Drains:75; Stool:500]  Physical Exam: Neck: no adenopathy, no carotid bruit, no JVD and supple, symmetrical, trachea midline Lungs: bilateral rhonchi noted Heart: regular rate and rhythm, S1, S2 normal and soft systolic murmur noted Abdomen: distended abdominal binder noted Extremities: no clubbing, cyanosis 4+ edema with cool extremities.  No evidence of ischemia  Lab Results: Recent Labs    06/02/17 0500 06/03/17 0435  WBC 17.4* 12.1*  HGB 8.2* 7.9*  PLT 246 241   Recent Labs    06/02/17 0500 06/03/17 0435  NA 144 144  K 4.7 4.4  CL 114* 114*  CO2 22 21*  GLUCOSE 217* 270*  BUN 41* 47*  CREATININE 1.75* 1.66*   No results for input(s): TROPONINI in the last 72 hours.  Invalid input(s): CK, MB Hepatic Function Panel Recent Labs    06/02/17 0500  PROT 5.5*  ALBUMIN 1.2*  AST 33  ALT 38  ALKPHOS 126  BILITOT 1.2   No results for input(s): CHOL in the last 72 hours. No results for input(s): PROTIME in the last 72 hours.  Imaging: Imaging results have been reviewed and Ct Abdomen Pelvis Wo Contrast  Result Date: 06/02/2017 CLINICAL DATA:  History of  colon cancer, post LAR with primary colorectal anastomosis and diverting loop ileostomy with umbilical hernia repair on 05/11/2017 by Dr. Georgette Dover found to have postoperative pneumoperitoneum and perihepatic fluid secondary to anastomotic leak on abdominal CT performed 05/25/2017. Percutaneous drain placed in the right perihepatic fluid collection on 05/26/2017. EXAM: CT ABDOMEN AND PELVIS WITHOUT CONTRAST TECHNIQUE: Multidetector CT imaging of the abdomen and pelvis was performed following the standard protocol without IV contrast. 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 The surgical changes are incompletely imaged and incompletely evaluated in the absence of intravenous contrast material. Small right-sided pleural effusion with associated right lower lobe atelectasis. The lungs are otherwise clear. The heart is within normal limits for size. No pericardial effusion. Hepatobiliary: Grossly normal hepatic contour and morphology. Stones are present within the gallbladder lumen. No intra or extrahepatic biliary ductal dilatation. Persistent perihepatic fluid collection despite the well-positioned drainage catheter. Using the coronal and sagittal reformatted images, the fluid collection measures approximately 24 x 12 by 16 cm in diameter and extends inferiorly and anteriorly communicating with a large air and gas filled pocket which extends across the entire anterior aspect of the abdomen. The margins of the fluid collection are well-defined and convex. Pancreas: Unremarkable. No pancreatic ductal dilatation or surrounding inflammatory changes. Spleen: Normal in size without focal abnormality. Adrenals/Urinary  Tract: The adrenal glands are unremarkable. A trophic left kidney. No evidence of right-sided  hydronephrosis or nephrolithiasis. Stomach/Bowel: A weighted tip enteric feeding tube is present within the stomach. No evidence of bowel obstruction. Surgical changes of prior low anterior resection with rectal colonic anastomosis and a diverting right lower quadrant loop ileostomy. Colonic diverticular disease without CT evidence of active inflammation. Vascular/Lymphatic: Incompletely imaged in the absence of intravenous contrast. More multiple a fenestrated endovascular abdominal aortic repair. No suspicious lymphadenopathy. Reproductive: Prostate is unremarkable. Other: Improving right psoas muscle hematoma. The psoas is less expanded and there is less high attenuation material within the peri psoas retroperitoneal fat. Enlarging loculated fluid collection in the right lower quadrant extending into the small bowel mesentery which measures approximately 12.6 x 7.5 cm. This may communicate with the inferior margin of the perihepatic and upper/mid abdominal fluid collection as well. Musculoskeletal: No acute fracture or aggressive appearing lytic or blastic osseous lesion. Multilevel degenerative disc disease. Open midline surgical incision. IMPRESSION: 1. Progressive multifocal loculated fluid and gas collections throughout much of the peritoneal space compared to 05/25/2017 despite the well-positioned right perihepatic percutaneous drainage catheter suggesting persistent anastomotic leak and/or peritoneal abscess. A very large fluid and gas collection now extends from the right subdiaphragmatic space inferiorly and laterally spanning across the entirety of the upper abdomen. There is a second large fluid collection in the left lower quadrant extending into the small bowel mesenteries which has also enlarged compared to prior. There may be some communication between these 2 collections. 2. The tip of the enteric feeding tube is present within the stomach. 3. Improving right psoas hematoma. 4. Small right-sided  pleural effusion with associated right lower lobe atelectasis is likely reactive. 5. Additional ancillary findings as above without significant interval change. Electronically Signed   By: Jacqulynn Cadet M.D.   On: 06/02/2017 13:26   Dg Chest Port 1 View  Result Date: 06/03/2017 CLINICAL DATA:  Check endotracheal tube placement EXAM: PORTABLE CHEST 1 VIEW COMPARISON:  06/02/2017 FINDINGS: Aortic stent graft is again noted and stable. Endotracheal tube, feeding catheter and right jugular central line are again noted and stable. The right-sided PICC line has been removed in the interval. Diffuse right upper lobe infiltrate is again identified and stable from the prior exam. No acute bony abnormality is seen. IMPRESSION: Stable right upper lobe infiltrate. Electronically Signed   By: Inez Catalina M.D.   On: 06/03/2017 07:44   Dg Chest Port 1 View  Result Date: 06/02/2017 CLINICAL DATA:  Acute respiratory failure with hypoxemia. Endotracheal tube present. EXAM: PORTABLE CHEST 1 VIEW COMPARISON:  06/01/2017 FINDINGS: Endotracheal tube is appropriately positioned, measuring 3.9 cm above the carina. Feeding tube extends into the upper abdomen but the tip is not visualized. Again noted is an aortic stent graft involving the aortic arch and descending thoracic aorta and thoracoabdominal aorta. Density around the stent graft compatible with the thoracic aortic aneurysm sac. Persistent airspace disease in the right upper lung has minimally changed. PICC line tip and right jugular central venous catheter are both in the lower SVC region. Cardiac silhouette is stable for size but poorly characterized due to the enlarged thoracic aorta. IMPRESSION: No significant change in the airspace disease within the right upper lung. Findings are suggestive for pneumonia. Support apparatuses are appropriately positioned as described. Incomplete evaluation of the feeding tube. Electronically Signed   By: Markus Daft M.D.   On:  06/02/2017 09:31   Dg Chest Port 1 View  Addendum  Date: 06/01/2017   ADDENDUM REPORT: 06/01/2017 17:27 ADDENDUM: Right internal jugular central line tip is at the cavoatrial junction. Right PICC line tip is in the lower SVC. Electronically Signed   By: Rolm Baptise M.D.   On: 06/01/2017 17:27   Result Date: 06/01/2017 CLINICAL DATA:  Status post central line placement EXAM: PORTABLE CHEST 1 VIEW COMPARISON:  06/01/2017 FINDINGS: Endotracheal tube and feeding catheter in satisfactory position. Aortic stent graft is again identified and stable. Right jugular central line and right-sided PICC line are noted in satisfactory position. No pneumothorax is noted. Left lung is well aerated with minimal left basilar atelectasis. Increasing right upper lobe infiltrate is noted. IMPRESSION: Increasing right upper lobe infiltrate. No pneumothorax following central line placement. The remainder the exam is stable. Electronically Signed: By: Inez Catalina M.D. 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 Dr. Doylene Canard  on-call for weekend     LOS: 28 days    Charolette Forward 06/03/2017, 10:32 AM

## 2017-06-03 NOTE — Progress Notes (Signed)
Vent changes made per MD order. 

## 2017-06-03 NOTE — Progress Notes (Addendum)
[facility] Progress Note 23 Days Post-Op  Subjective: Patient is currently sedated on Precedex, off of the Levophed.  Objective: Vital signs in last 24 hours: Temp:  [97.6 F (36.4 C)-99.1 F (37.3 C)] 98.6 F (37 C) (05/24 0818) Pulse Rate:  [50-89] 59 (05/24 0729) Resp:  [9-30] 27 (05/24 0729) BP: (84-143)/(42-105) 119/89 (05/24 0818) SpO2:  [73 %-100 %] 100 % (05/24 0729) Arterial Line BP: (118-202)/(36-60) 124/39 (05/24 0729) FiO2 (%):  [40 %-50 %] 40 % (05/24 0400) Weight:  [121.6 kg (268 lb 1.3 oz)-121.8 kg (268 lb 8.3 oz)] 121.6 kg (268 lb 1.3 oz) (05/24 0431) Last BM Date: 06/02/17  Intake/Output from previous day: 05/23 0701 - 05/24 0700 In: 3493.7 [I.V.:2386.8; NG/GT:1001.9; IV Piggyback:100] Out: 1610 [Urine:1225; Drains:360; Stool:25] Intake/Output this shift: Total I/O In: -  Out: 650 [Urine:75; Drains:75; Stool:500]  General: Sedated.  Lungs: Clear.  PEEP 10, FIO2 40%.  Abd: Soft, open wound with exposed bowel.  Tolerating tube feedings well.  Extremities: Ice cold feet with significant  4+ edema.  No palpable pulses in periphery  Neuro: Sedated  Lab Results:  @LABLAST2 (wbc:2,hgb:2,hct:2,plt:2) BMET ) Recent Labs    06/02/17 0500 06/03/17 0435  NA 144 144  K 4.7 4.4  CL 114* 114*  CO2 22 21*  GLUCOSE 217* 270*  BUN 41* 47*  CREATININE 1.75* 1.66*  CALCIUM 7.5* 7.5*   PT/INR No results for input(s): LABPROT, INR in the last 72 hours. ABG Recent Labs    06/01/17 0916 06/01/17 1309 06/01/17 1403  PHART 7.111*  --  7.338*  HCO3 20.3 23.0 24.1    Studies/Results: Ct Abdomen Pelvis Wo Contrast  Result Date: 06/02/2017 CLINICAL DATA:  History of colon cancer, post LAR with primary colorectal anastomosis and diverting loop ileostomy with umbilical hernia repair on 05/11/2017 by Dr. Georgette Dover found to have postoperative pneumoperitoneum and perihepatic fluid secondary to anastomotic leak on abdominal CT performed 05/25/2017. Percutaneous drain  placed in the right perihepatic fluid collection on 05/26/2017. EXAM: CT ABDOMEN AND PELVIS WITHOUT CONTRAST TECHNIQUE: Multidetector CT imaging of the abdomen and pelvis was performed following the standard protocol without IV contrast. 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 The surgical changes are incompletely imaged and incompletely evaluated in the absence of intravenous contrast material. Small right-sided pleural effusion with associated right lower lobe atelectasis. The lungs are otherwise clear. The heart is within normal limits for size. No pericardial effusion. Hepatobiliary: Grossly normal hepatic contour and morphology. Stones are present within the gallbladder lumen. No intra or extrahepatic biliary ductal dilatation. Persistent perihepatic fluid collection despite the well-positioned drainage catheter. Using the coronal and sagittal reformatted images, the fluid collection measures approximately 24 x 12 by 16 cm in diameter and extends inferiorly and anteriorly communicating with a large air and gas filled pocket which extends across the entire anterior aspect of the abdomen. The margins of the fluid collection are well-defined and convex. Pancreas: Unremarkable. No pancreatic ductal dilatation or surrounding inflammatory changes. Spleen: Normal in size without focal abnormality. Adrenals/Urinary Tract: The adrenal glands are unremarkable. A trophic left kidney. No evidence of right-sided hydronephrosis or nephrolithiasis. Stomach/Bowel: A weighted tip enteric feeding tube is present within the stomach. No evidence of bowel obstruction. Surgical changes of prior low anterior resection with rectal colonic anastomosis and a diverting right lower quadrant loop ileostomy. Colonic diverticular  disease without CT evidence of active inflammation. Vascular/Lymphatic: Incompletely imaged in the absence of intravenous contrast. More multiple a fenestrated endovascular abdominal aortic repair. No suspicious lymphadenopathy. Reproductive: Prostate is unremarkable. Other: Improving right psoas muscle hematoma. The psoas is less expanded and there is less high attenuation material within the peri psoas retroperitoneal fat. Enlarging loculated fluid collection in the right lower quadrant extending into the small bowel mesentery which measures approximately 12.6 x 7.5 cm. This may communicate with the inferior margin of the perihepatic and upper/mid abdominal fluid collection as well. Musculoskeletal: No acute fracture or aggressive appearing lytic or blastic osseous lesion. Multilevel degenerative disc disease. Open midline surgical incision. IMPRESSION: 1. Progressive multifocal loculated fluid and gas collections throughout much of the peritoneal space compared to 05/25/2017 despite the well-positioned right perihepatic percutaneous drainage catheter suggesting persistent anastomotic leak and/or peritoneal abscess. A very large fluid and gas collection now extends from the right subdiaphragmatic space inferiorly and laterally spanning across the entirety of the upper abdomen. There is a second large fluid collection in the left lower quadrant extending into the small bowel mesenteries which has also enlarged compared to prior. There may be some communication between these 2 collections. 2. The tip of the enteric feeding tube is present within the stomach. 3. Improving right psoas hematoma. 4. Small right-sided pleural effusion with associated right lower lobe atelectasis is likely reactive. 5. Additional ancillary findings as above without significant interval change. Electronically Signed   By: Jacqulynn Cadet M.D.   On: 06/02/2017 13:26   Dg Chest Port 1 View  Result Date: 06/03/2017 CLINICAL DATA:  Check  endotracheal tube placement EXAM: PORTABLE CHEST 1 VIEW COMPARISON:  06/02/2017 FINDINGS: Aortic stent graft is again noted and stable. Endotracheal tube, feeding catheter and right jugular central line are again noted and stable. The right-sided PICC line has been removed in the interval. Diffuse right upper lobe infiltrate is again identified and stable from the prior exam. No acute bony abnormality is seen. IMPRESSION: Stable right upper lobe infiltrate. Electronically Signed   By: Inez Catalina M.D.   On: 06/03/2017 07:44   Dg Chest Port 1 View  Result Date: 06/02/2017 CLINICAL DATA:  Acute respiratory failure with hypoxemia. Endotracheal tube present. EXAM: PORTABLE CHEST 1 VIEW COMPARISON:  06/01/2017 FINDINGS: Endotracheal tube is appropriately positioned, measuring 3.9 cm above the carina. Feeding tube extends into the upper abdomen but the tip is not visualized. Again noted is an aortic stent graft involving the aortic arch and descending thoracic aorta and thoracoabdominal aorta. Density around the stent graft compatible with the thoracic aortic aneurysm sac. Persistent airspace disease in the right upper lung has minimally changed. PICC line tip and right jugular central venous catheter are both in the lower SVC region. Cardiac silhouette is stable for size but poorly characterized due to the enlarged thoracic aorta. IMPRESSION: No significant change in the airspace disease within the right upper lung. Findings are suggestive for pneumonia. Support apparatuses are appropriately positioned as described. Incomplete evaluation of the feeding tube. Electronically Signed   By: Markus Daft M.D.   On: 06/02/2017 09:31   Dg Chest Port 1 View  Addendum Date: 06/01/2017   ADDENDUM REPORT: 06/01/2017 17:27 ADDENDUM: Right internal jugular central line tip is at the cavoatrial junction. Right PICC line tip is in the lower SVC. Electronically Signed   By: Rolm Baptise M.D.   On: 06/01/2017 17:27   Result Date:  06/01/2017 CLINICAL DATA:  Status post central line placement  EXAM: PORTABLE CHEST 1 VIEW COMPARISON:  06/01/2017 FINDINGS: Endotracheal tube and feeding catheter in satisfactory position. Aortic stent graft is again identified and stable. Right jugular central line and right-sided PICC line are noted in satisfactory position. No pneumothorax is noted. Left lung is well aerated with minimal left basilar atelectasis. Increasing right upper lobe infiltrate is noted. IMPRESSION: Increasing right upper lobe infiltrate. No pneumothorax following central line placement. The remainder the exam is stable. Electronically Signed: By: Inez Catalina M.D. On: 06/01/2017 13:26   Portable Chest X-ray  Result Date: 06/01/2017 CLINICAL DATA:  Endotracheal tube placement. EXAM: PORTABLE CHEST 1 VIEW COMPARISON:  06/01/2017 and CT chest 05/06/2017. FINDINGS: Endotracheal tube terminates approximately 4.2 cm above the carina. Right PICC tip is in the low SVC. Descending thoracic aortic aneurysm with indwelling thoracoabdominal stent. Patchy airspace consolidation throughout the right lung with a right pleural effusion. Minimal left basilar atelectasis. IMPRESSION: 1. Endotracheal tube is in satisfactory position. 2. Patchy airspace opacification throughout the right lung may be due to pneumonia. Right pleural effusion. Electronically Signed   By: Lorin Picket M.D.   On: 06/01/2017 10:07    Anti-infectives: Anti-infectives (From admission, onward)   Start     Dose/Rate Route Frequency Ordered Stop   05/28/17 1200  vancomycin (VANCOCIN) 1,250 mg in sodium chloride 0.9 % 250 mL IVPB  Status:  Discontinued     1,250 mg 166.7 mL/hr over 90 Minutes Intravenous Every 24 hours 05/27/17 1324 05/30/17 1615   05/27/17 1100  vancomycin (VANCOCIN) 1,250 mg in sodium chloride 0.9 % 250 mL IVPB  Status:  Discontinued     1,250 mg 166.7 mL/hr over 90 Minutes Intravenous Every 12 hours 05/27/17 1011 05/27/17 1324   05/20/17 2230   vancomycin (VANCOCIN) 1,250 mg in sodium chloride 0.9 % 250 mL IVPB  Status:  Discontinued     1,250 mg 166.7 mL/hr over 90 Minutes Intravenous Every 24 hours 05/20/17 2151 05/27/17 1011   05/20/17 1400  piperacillin-tazobactam (ZOSYN) IVPB 3.375 g     3.375 g 12.5 mL/hr over 240 Minutes Intravenous Every 8 hours 05/20/17 1044     05/20/17 0500  anidulafungin (ERAXIS) 100 mg in sodium chloride 0.9 % 100 mL IVPB  Status:  Discontinued     100 mg 78 mL/hr over 100 Minutes Intravenous Every 24 hours 05/19/17 0116 05/29/17 1214   05/19/17 2200  vancomycin (VANCOCIN) 1,500 mg in sodium chloride 0.9 % 500 mL IVPB  Status:  Discontinued     1,500 mg 250 mL/hr over 120 Minutes Intravenous Every 24 hours 05/18/17 2126 05/20/17 2150   05/19/17 0130  anidulafungin (ERAXIS) 200 mg in sodium chloride 0.9 % 200 mL IVPB     200 mg 78 mL/hr over 200 Minutes Intravenous  Once 05/19/17 0116 05/19/17 0520   05/19/17 0130  Ampicillin-Sulbactam (UNASYN) 3 g in sodium chloride 0.9 % 100 mL IVPB  Status:  Discontinued     3 g 200 mL/hr over 30 Minutes Intravenous Every 8 hours 05/19/17 0118 05/20/17 1044   05/18/17 2200  vancomycin (VANCOCIN) 1,750 mg in sodium chloride 0.9 % 500 mL IVPB     1,750 mg 250 mL/hr over 120 Minutes Intravenous  Once 05/18/17 2126 05/19/17 0400   05/17/17 0930  piperacillin-tazobactam (ZOSYN) IVPB 3.375 g  Status:  Discontinued     3.375 g 12.5 mL/hr over 240 Minutes Intravenous Every 8 hours 05/17/17 0836 05/19/17 0118   05/11/17 2200  cefoTEtan (CEFOTAN) 2 g in sodium chloride 0.9 %  100 mL IVPB     2 g 200 mL/hr over 30 Minutes Intravenous Every 12 hours 05/11/17 1839 05/11/17 2034   05/11/17 0600  cefoTEtan in Dextrose 5% (CEFOTAN) IVPB 2 g     2 g Intravenous On call to O.R. 05/10/17 1142 05/11/17 1416      Assessment/Plan: s/p Procedure(s): Colon Resection Sigmoid Low Anterior Resection with Staple Colon Relast HERNIA REPAIR UMBILICAL ADULT Creation Vertical Loop  ILEOSTOMY Wean off TPN and stay on tube feedings.   May need vascular to see patient about cold extremties without palpable pulses. Cut TPN to half rate then discontinue after current bag.--patient seems to be tolerating tube feedings. Well.  LOS: 28 days   Kathryne Eriksson. Dahlia Bailiff, MD, FACS [phone] [facility] Surgery 06/03/2017

## 2017-06-03 NOTE — Progress Notes (Signed)
PHARMACY - ADULT TOTAL PARENTERAL NUTRITION CONSULT NOTE   Pharmacy Consult:  TPN Indication: Prolonged ileus  Patient Measurements: Height: _0  (177.8 cm) Weight: 268 lb 1.3 oz (121.6 kg) IBW/kg (Calculated) : 73 TPN AdjBW (KG): 94.5 Body mass index is 38.47 kg/m.  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  Developed anastomotic leak with feculent output in JP drain. Patient currently with high ileostomy output. Patient's abdomen has become more distended and patient was made NPO. Pharmacy consulted for TPN.   Patient was on clear liquids (on and off NPO as well) but only tolerating minimal intake (<25%) for >10 days. Patient is at risk for refeeding.   GI: abd wound dehiscence. Prealbumin <5 - now up to 21.7, 25 ml ileostomy O/P, drain O/P 360 mL. Pepcid in TPN. LBM 5/21. Started soft diet 5/20 but back to full liquids, as pt did not want to eat. Cortrak placed 5/22, tube feeds ordered 5/23 - at goal and tolerating, wean TPN today Endo: no hx DM - hypoglycemic prior to TPN. Off steroids. CBGs fluctuating 125-257, ?TF related. Previously controlled until 5/22 Insulin requirements in the past 24 hours: 22 units SSI + 15 units in TPN Lytes: Cl up to 114 / CO2 WNL, others WNL Renal: AKI - SCr down 1.66, BUN up to 47 - UOP 0.3 ml/kg/hr, +20 L since admit Pulm: intubated 5/22 with worsening respiratory status; FiO2 40% Cards: Norepi gtt started 5/23 for low BP > off. HR 50-60s - holding Lopressor.  Hepatobil: LFTs / Tbili normalized, TG WNL Heme: CT shows spontaneous RTP hematoma, holding heparin - low but stable, plts WNL Neuro: Precedex gtt resumed - ongoing agitation. PRN fent/versed. GCS 10, RASS at goal -2, CPOT 0 ID: Zosyn for  abdominal abscess due to pan-sensitive pseudomonas - ID recommends 7 more days from 5/21, Changing central line 5/22. BAL cx and BCx pending. Afebrile, WBC down to 12.1 TPN Access: CVC triple lumen placed 05/18/17 TPN start date: 05/20/17  Nutritional Goals (per RD rec updated on 5/22): 2194 kCal, 105-120g protein, 2.1- 2.4 L per day  Current Nutrition:  TPN - weaning Prostat 40m BID + Vital 1.5 at goal 55 ml/hr - will provide 2180 kCal + 119g protein  Plan:  Wean TPN per Trauma note as tolerating tube feeds Continue SSI moderate Q4H, monitor CBG trend without insulin in TPN on board and add basal insulin if needed Change vitamin C 500 mg per tube daily Change Pepcid 40 mg per tube daily D/C TPN consult, labs and nursing care orders    JRenold Genta PharmD, BCPS Clinical Pharmacist Clinical phone for 06/03/2017 until 3p is [phone] After 3p, please call Main Rx at [phone] assistance 06/03/2017 8:52 AM

## 2017-06-03 NOTE — Consult Note (Addendum)
[facility] Nurse wound consult note Reason for Consult:Moisture associated skin damage to penile meatus. Recessed penis present.  Currently with condom cath in place.  Will replace with foley catheter today. MASD to buttocks and scrotum noted. Protect with barrier cream twice daily and PRN soilage.  Wound type:MASD to meatus.  New epithelial tissue noted.  No further treatment needed.  Will place foley to remove exposure to urinary incontinence.  Pressure Injury POA: NA Measurement: 0.2 cm pink areas of new epithelial growth Wound YIR:SWNIOE Drainage (amount, consistency, odor) none Periwound:intact Dressing procedure/placement/frequency:Cleanse penis with soap and water and perform foley catheter care as needed.   Domenic Moras RN BSN [facility] Pager [phone] [place] Nurse ostomy follow up Stoma type/location: RLQ ileostomy  Separation noted at 3:00.  Will protect with barrier ring Stomal assessment/size: 1 1/2" pale pink patent and producing light brown effluent Peristomal assessment: separation noted at 3 o'clock Treatment options for stomal/peristomal skin: protect with barrier ring Output light brown stool Ostomy pouching: 2pc. 2 3/4" pouch with barrier ring  Education provided: none Enrolled patient in [place] Start Discharge program:No  [place] team will follow and remain available to patient, medical and nursing teams.  Domenic Moras RN BSN [place] Pager [phone]

## 2017-06-04 LAB — BASIC METABOLIC PANEL
Anion gap: 7 (ref 5–15)
BUN: 52 mg/dL — AB (ref 6–20)
CHLORIDE: 116 mmol/L — AB (ref 101–111)
CO2: 22 mmol/L (ref 22–32)
CREATININE: 1.79 mg/dL — AB (ref 0.61–1.24)
Calcium: 7.5 mg/dL — ABNORMAL LOW (ref 8.9–10.3)
GFR calc Af Amer: 42 mL/min — ABNORMAL LOW (ref >60)
GFR calc non Af Amer: 36 mL/min — ABNORMAL LOW (ref >60)
Glucose, Bld: 178 mg/dL — ABNORMAL HIGH (ref 65–99)
Potassium: 3.9 mmol/L (ref 3.5–5.1)
SODIUM: 145 mmol/L (ref 135–145)

## 2017-06-04 LAB — GLUCOSE, CAPILLARY
GLUCOSE-CAPILLARY: 90 mg/dL (ref 65–99)
Glucose-Capillary: 156 mg/dL — ABNORMAL HIGH (ref 65–99)
Glucose-Capillary: 161 mg/dL — ABNORMAL HIGH (ref 65–99)
Glucose-Capillary: 181 mg/dL — ABNORMAL HIGH (ref 65–99)

## 2017-06-04 LAB — CBC
HCT: 24.8 % — ABNORMAL LOW (ref 39.0–52.0)
Hemoglobin: 7.4 g/dL — ABNORMAL LOW (ref 13.0–17.0)
MCH: 26.1 pg (ref 26.0–34.0)
MCHC: 29.8 g/dL — AB (ref 30.0–36.0)
MCV: 87.3 fL (ref 78.0–100.0)
Platelets: 186 10*3/uL (ref 150–400)
RBC: 2.84 MIL/uL — AB (ref 4.22–5.81)
RDW: 26.8 % — AB (ref 11.5–15.5)
WBC: 8 10*3/uL (ref 4.0–10.5)

## 2017-06-04 LAB — POCT I-STAT 3, ART BLOOD GAS (G3+)
Acid-base deficit: 4 mmol/L — ABNORMAL HIGH (ref 0.0–2.0)
BICARBONATE: 20.3 mmol/L (ref 20.0–28.0)
O2 Saturation: 94 %
PCO2 ART: 32.5 mmHg (ref 32.0–48.0)
PO2 ART: 72 mmHg — AB (ref 83.0–108.0)
Patient temperature: 37.3
TCO2: 21 mmol/L — AB (ref 22–32)
pH, Arterial: 7.406 (ref 7.350–7.450)

## 2017-06-04 LAB — CULTURE, BAL-QUANTITATIVE

## 2017-06-04 LAB — MAGNESIUM: MAGNESIUM: 2.5 mg/dL — AB (ref 1.7–2.4)

## 2017-06-04 LAB — CULTURE, BAL-QUANTITATIVE W GRAM STAIN

## 2017-06-04 MED ORDER — VITAL 1.5 CAL PO LIQD
1000.0000 mL | ORAL | Status: DC
  Administered 2017-06-05 – 2017-06-06 (×3): 1000 mL
  Filled 2017-06-04 (×6): qty 1000

## 2017-06-04 NOTE — Progress Notes (Signed)
While turning patient this am patient vomited bright yellow bile. MD Kinsinger and NP Groce notified. Per MD hold TF until tomorrow 5/26 and continue to monitor CBG's q4.  Patient cleaned up and given bath, VSS throughout. Will continue to monitor closely.

## 2017-06-04 NOTE — Progress Notes (Signed)
Ref: Jesse Forward, MD   Subjective:  Sedated and intubated. Tube feed on hold. T max 100.9 degree F.   Objective:  Vital Signs in the last 24 hours: Temp:  [99.1 F (37.3 C)-100.9 F (38.3 C)] 99.7 F (37.6 C) (05/25 0800) Pulse Rate:  [46-72] 54 (05/25 0832) Cardiac Rhythm: Sinus bradycardia (05/25 0800) Resp:  [21-32] 29 (05/25 0832) BP: (86-150)/(41-75) 105/41 (05/25 0800) SpO2:  [88 %-100 %] 100 % (05/25 0832) Arterial Line BP: (108-222)/(31-75) 125/37 (05/25 0800) FiO2 (%):  [40 %] 40 % (05/25 0832) Weight:  [120.7 kg (266 lb 1.5 oz)] 120.7 kg (266 lb 1.5 oz) (05/25 0500)  Physical Exam: BP Readings from Last 1 Encounters:  06/04/17 (!) 105/41     Wt Readings from Last 1 Encounters:  06/04/17 120.7 kg (266 lb 1.5 oz)    Weight change: -1.1 kg (-2 lb 6.8 oz) Body mass index is 38.18 kg/m. HEENT: [place]/AT, Eyes-Brown, Conjunctiva-Pale, Sclera-Non-icteric Neck: No JVD, No bruit, Trachea midline. Lungs:  Clearing, Bilateral. Cardiac:  Regular rhythm, normal S1 and S2, no S3. II/VI systolic murmur. Abdomen:  Soft, non-tender. BS present. Extremities:  2 + edema present. No cyanosis. No clubbing. CNS: AxOx3, Cranial nerves grossly intact, moves all 4 extremities.  Skin: Warm and dry.   Intake/Output from previous day: 05/24 0701 - 05/25 0700 In: 2948 [I.V.:1164.4; NG/GT:1583.6; IV Piggyback:150] Out: 8546 [Urine:1525; Drains:515; Stool:2100]    Lab Results: BMET    Component Value Date/Time   NA 145 06/04/2017 0345   NA 144 06/03/2017 0435   NA 144 06/02/2017 0500   K 3.9 06/04/2017 0345   K 4.4 06/03/2017 0435   K 4.7 06/02/2017 0500   CL 116 (H) 06/04/2017 0345   CL 114 (H) 06/03/2017 0435   CL 114 (H) 06/02/2017 0500   CO2 22 06/04/2017 0345   CO2 21 (L) 06/03/2017 0435   CO2 22 06/02/2017 0500   GLUCOSE 178 (H) 06/04/2017 0345   GLUCOSE 270 (H) 06/03/2017 0435   GLUCOSE 217 (H) 06/02/2017 0500   BUN 52 (H) 06/04/2017 0345   BUN 47 (H) 06/03/2017  0435   BUN 41 (H) 06/02/2017 0500   CREATININE 1.79 (H) 06/04/2017 0345   CREATININE 1.66 (H) 06/03/2017 0435   CREATININE 1.75 (H) 06/02/2017 0500   CALCIUM 7.5 (L) 06/04/2017 0345   CALCIUM 7.5 (L) 06/03/2017 0435   CALCIUM 7.5 (L) 06/02/2017 0500   GFRNONAA 36 (L) 06/04/2017 0345   GFRNONAA 40 (L) 06/03/2017 0435   GFRNONAA 37 (L) 06/02/2017 0500   GFRAA 42 (L) 06/04/2017 0345   GFRAA 46 (L) 06/03/2017 0435   GFRAA 43 (L) 06/02/2017 0500   CBC    Component Value Date/Time   WBC 8.0 06/04/2017 0345   RBC 2.84 (L) 06/04/2017 0345   HGB 7.4 (L) 06/04/2017 0345   HCT 24.8 (L) 06/04/2017 0345   PLT 186 06/04/2017 0345   MCV 87.3 06/04/2017 0345   MCH 26.1 06/04/2017 0345   MCHC 29.8 (L) 06/04/2017 0345   RDW 26.8 (H) 06/04/2017 0345   LYMPHSABS 0.8 05/30/2017 0344   MONOABS 1.0 05/30/2017 0344   EOSABS 0.0 05/30/2017 0344   BASOSABS 0.0 05/30/2017 0344   HEPATIC Function Panel Recent Labs    05/26/17 0426 05/30/17 0637 06/02/17 0500  PROT 4.7* 5.3* 5.5*   HEMOGLOBIN A1C No components found for: HGA1C,  MPG CARDIAC ENZYMES No results found for: CKTOTAL, CKMB, CKMBINDEX, TROPONINI BNP No results for input(s): PROBNP in the last 8760  hours. TSH No results for input(s): TSH in the last 8760 hours. CHOLESTEROL No results for input(s): CHOL in the last 8760 hours.  Scheduled Meds: . chlorhexidine gluconate (MEDLINE KIT)  15 mL Mouth Rinse BID  . Chlorhexidine Gluconate Cloth  6 each Topical Daily  . famotidine  40 mg Per Tube Daily  . feeding supplement (PRO-STAT SUGAR FREE 64)  30 mL Per Tube BID  . insulin aspart  0-15 Units Subcutaneous Q4H  . lip balm  1 application Topical BID  . mouth rinse  15 mL Mouth Rinse 10 times per day  . sodium chloride flush  5 mL Intracatheter Q8H  . vitamin C  500 mg Per Tube Daily   Continuous Infusions: . dexmedetomidine (PRECEDEX) IV infusion 1.2 mcg/kg/hr (06/04/17 1007)  . feeding supplement (VITAL 1.5 CAL) Stopped  (06/04/17 1000)  . norepinephrine (LEVOPHED) Adult infusion Stopped (06/03/17 1324)  . piperacillin-tazobactam (ZOSYN)  IV Stopped (06/04/17 0921)   PRN Meds:.acetaminophen (TYLENOL) oral liquid 160 mg/5 mL, dextrose, fentaNYL (SUBLIMAZE) injection, hydrALAZINE, hydrocortisone, hydrocortisone cream, midazolam, [DISCONTINUED] ondansetron **OR** ondansetron (ZOFRAN) IV, prochlorperazine, sodium chloride flush  Assessment/Plan: Septic shock with intra-abdominal infection S/P Rectosigmoid adenocarcinoma with LAR resection S/P vertical loop ileostomy Perihepatic drain Abdominal wound dehisencence Acute respiratory failure CAD DM, II Leukocytosis COPS Obesity Hypoalbuminemia  Continue follow up with CCM and surgery.   LOS: 29 days    [name]  MD  06/04/2017, 11:01 AM

## 2017-06-04 NOTE — Progress Notes (Addendum)
PULMONARY / CRITICAL CARE MEDICINE   Name: Jesse Le MRN: [id number] DOB: 08-27-1945    ADMISSION DATE:  05/06/2017 CONSULTATION DATE:  05/18/2017  REFERRING MD:  Dr. Terrence Dupont  CHIEF COMPLAINT:  Hypotension  HISTORY OF PRESENT ILLNESS:   73 yo male presented with abdominal pain, weight loss and intermittent hematochezia.  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  Had low anterior resection with primary anastomosis with diverting loop ileostomy.  Developed hypotension, hypoglycemia 5/07.  Developed hypotension and altered mental status 5/08 and transferred to ICU. Pt has an anastomotic leak, drain placed by surgery PMHx of CAD, DM, TAA.  5/22 Re-consult for progressive hypoxia throughout the night and ongoing confusion, ~70's on RA, placed on NRB then to BiPAP.  CXR- rotated film worsening R multifocal consolidation, small pleural effusions.  Net +21 L.  Hypertensive, afebrile x 48 hours>> Intubated  Subjective: Off norepi since  5/24 am Tolerated PEEP of 5 CPAP/PS40%,  5 PEEP, 14 PS T Max 100.9 + 14 L since admission  VITAL SIGNS: BP (!) 105/41 (BP Location: Right Wrist)   Pulse (!) 54   Temp 99.7 F (37.6 C) (Core (Comment))   Resp (!) 29   Ht 5\' 10"  (1.778 m)   Wt 266 lb 1.5 oz (120.7 kg)   SpO2 100%   BMI 38.18 kg/m   INTAKE / OUTPUT: I/O last 3 completed shifts: In: 4958.8 [I.V.:2470.3; Other:50; NG/GT:2188.6; IV Piggyback:250] Out: 2841 [Urine:2160; Drains:715; Stool:2100]  PHYSICAL EXAMINATION: General:  Critically ill appearing male,  intubated, sedated, on CPAP/PS,in NAD HEENT: Atraumatic, normocephalic, ETT secure and  in place, neck thick and supple, MM pink/moist, Neuro: Sedated, drowsy, arouses to voice, follows some commands CV: Regular,  Bradycardia with HR in the 50's, S1, S2, No RMG PULM: Coarse breath sounds bilaterally, diminished per bases due to large abdomen/ wound GI: Ileostomy, JP drain in place with about  30 cc  drainage per hour, abdominal binder in place, hypoactive bowel sounds Extremities: Diffuse 2-3 +edema, nailbeds are pink with  Brisk refill Skin: No obvious rashes, lesions, or ulcerations, large abdominal wound  LABS:  BMET Recent Labs  Lab 06/02/17 0500 06/03/17 0435 06/04/17 0345  NA 144 144 145  K 4.7 4.4 3.9  CL 114* 114* 116*  CO2 22 21* 22  BUN 41* 47* 52*  CREATININE 1.75* 1.66* 1.79*  GLUCOSE 217* 270* 178*    Electrolytes Recent Labs  Lab 06/01/17 0343 06/02/17 0500 06/03/17 0435 06/04/17 0345  CALCIUM 7.7* 7.5* 7.5* 7.5*  MG 2.1 2.3 2.4 2.5*  PHOS 3.1 5.3* 3.3  --     CBC Recent Labs  Lab 06/02/17 0500 06/03/17 0435 06/04/17 0345  WBC 17.4* 12.1* 8.0  HGB 8.2* 7.9* 7.4*  HCT 28.1* 26.7* 24.8*  PLT 246 241 186    Coag's No results for input(s): APTT, INR in the last 168 hours.  Sepsis Markers No results for input(s): LATICACIDVEN, PROCALCITON, O2SATVEN in the last 168 hours.  ABG Recent Labs  Lab 06/01/17 0916 06/01/17 1403 06/04/17 0352  PHART 7.111* 7.338* 7.406  PCO2ART 66.8* 44.7 32.5  PO2ART 69.3* 96.0 72.0*    Liver Enzymes Recent Labs  Lab 05/30/17 0637 06/02/17 0500  AST 47* 33  ALT 66* 38  ALKPHOS 269* 126  BILITOT 1.4* 1.2  ALBUMIN 1.2* 1.2*    Cardiac Enzymes No results for input(s): TROPONINI, PROBNP in the last 168 hours.  Glucose Recent Labs  Lab 06/03/17 1200 06/03/17 1629 06/03/17  2030 06/04/17 0003 06/04/17 0438 06/04/17 0823  GLUCAP 161* 161* 192* 181* 161* 90    Imaging No results found. STUDIES:  CT abd 5/09 >> discontinuity in the posterior wall just above the anastomosis with feculent appearing material and gas in the pelvis, measuring up to 5x 3 cm anterior and superior to the sigmoid colon.  Fluid collection right lower quadrant measuring up to 6.4 x 3.5 cm. Moderate pneumoperitoneum.   CULTURES: MRSA PCR 5/8 >> neg Blood culture 5/8 >> neg Abd ascess 5/16 >> pseudomonas > pan  sensitive Blood culture 5/22>> Urine culture 5/22>> No growth Bronchial alveolar lavage 5/22>>  ANTIBIOTICS: Zosyn 5/7 > 5/8 Zosyn 5/10>> Unasyn 5/8 > 5/10 Vancomycin 5/7 >5/20 Eraxis 5/8 > 5/18  LINES/TUBES: Lt IJ CVL  5/8 > 5/10 JP Drain 5/1> RUE PICC 5/10 >>5/23 Right IJ CVC 5/22>>  DISCUSSION: 72 yo male with sepsis from peritonitis after surgery for colon cancer and CT abd +  for anastomotic leak.>> Drain placed 5/1.  Pt with acute hypoxic respiratory failure on 5/22 requiring intubation.  Concern for Aspiration.  ASSESSMENT / PLAN:  Acute Hypoxic respiratory insufficiency 2/2 R lung consolidation HAP vs aspiration pneumonitis  Continue PRVC 8 cc/kg, wean PEEP from 10 to 5 5/24 Tolerating CPAP 5/25/am ( 40%, % Peep, 14 PS) Plan: CXR prn Titrate oxygen and PEEP for sats > 92% VAP prevention bundle Pulmonary hygiene Zosyn as below Push spontaneous breathing trials once he tolerates lower PEEP May need trach if current weaning is unsuccessful  Sepsis with septic shock from peritonitis- resolved  Abd abscess w/ pseudomonas  Colon cancer s/p low anterior resection with ileostomy w/anastomosis leak  UA w/ +Leukocytes & many bacteria>> UC negative on 5/23 Probable aspiration pneumonia Wound debridement / dressing change per surgery 5/25/am Plan: Continue Zosyn given concern for new pulmonary infection Trend Fever and WBC (WBC  Down trending 5/25) Follow culture data>>reculture as is clinically indicated Abdominal drain in place, surgery following  Acute renal failure from ATN. Slight bump in creatinine overnight  Plan:   Diuresis as he can tolerate, hematin Amick instability has precluded this recently Follow urine output, serum creatinine, electrolytes Avoid nephrotoxins Replete electrolytes prn  Anemia HGB 7.4 >> ( ? if hemo dilutional) Plan Follow CBC Transfusion threshold hemoglobin < 7 Monitor for any obvious bleeding  GI Ilesotomy/ JP/ Open abdominal  wound Hypoalbuminemia TNA stopped 5/24 TF started 5/24>> vomited large amount 5/25 am Plan: Hold TF x 4 hours, then resume at 10cc/hr and then per surgery CMET 5/26 to monitor albumin Monitor ileostomy for high output>> Creatinine increase>> decrease in bicarb Consider immodium/ lomotil per Surgery if needed     DM type II. Hypoglycemia in setting of sepsis- resolved now hyperglycemic  ICU hyperglycemia protocol Sliding scale insulin  Acute metabolic encephalopathy 2nd to sepsis- ongoing RASS goal 0 to -1 Continue Precedex drip>> wean as able Minimize/ Avoid  other sedating medications if able Daily wake up assessment  Hypertension>>resolved, now hypotensive likely in setting of vent/sedation Bradycardia>>likely in setting or precedex gtt Norepinephrine off since 5/24 Metoprolol on hold Minimize sedation >> likely contributing to his hypotension/need for pressors   VTE prophylaxis -SCD's only (given hx of retroperitoneal bleed) SUP-Pepcid Nutrition - Transitioned to TF 5/24 Goals of care- Full code  Prognosis continued to be  guarded  Family- No family present at bedside 5/25   Magdalen Spatz, AGACNP-BC 06/04/2017, 9:18 AM [facility] [phone]

## 2017-06-04 NOTE — Progress Notes (Signed)
Progress Note: General Surgery Service   Assessment/Plan: Patient Active Problem List   Diagnosis Date Noted  . Acute respiratory failure with hypoxemia ([place])   . Atelectasis   . Septic shock ([place])   . Hypoglycemia after GI (gastrointestinal) surgery   . Colostomy present ([place])   . Ventral hernia s/p primary repair 05/11/2017 05/14/2017  . Physical deconditioning 05/14/2017  . CAD (coronary atherosclerotic disease) 05/14/2017  . Diabetes mellitus without complication ([place])   . Acute blood loss anemia   . 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 Iron deficiency anemia due to chronic blood loss   . Abnormal CT scan, sigmoid colon   . Generalized abdominal pain 05/06/2017  . 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 Aftercare following surgery of the circulatory system, [place] 11/22/2011   s/p Procedure(s): Colon Resection Sigmoid Low Anterior Resection with Staple Colon Relast HERNIA REPAIR UMBILICAL ADULT Creation Vertical Loop ILEOSTOMY 05/11/2017 -wound sharply debrided at bedside today -continue dressing changes -continue tube feeds at goal -continue perihepatic drain -ileostomy starting to be high output, if continues and Cr increases, may require immodium or lomotil for high output ileostomy. Will hold off at this time -failed extubation last week with reintubation -anastomotic leak controlled with diversion, WBC trending down, no fevers -wound dehiscence with granulated base, concern for bowel present for a 3x4cm area at base of wound.    LOS: 29 days  Chief Complaint/Subjective: No events overnight, some agitation this morning  Objective: Vital signs in last 24 hours: Temp:  [97.9 F (36.6 C)-100.9 F (38.3 C)] 99.5 F (37.5 C) (05/25 0700) Pulse Rate:  [46-72] 53 (05/25 0700) Resp:  [21-32] 29 (05/25 0020) BP: (86-150)/(43-89) 112/46 (05/25 0700) SpO2:  [88 %-100 %] 99 % (05/25 0700) Arterial Line BP: (108-222)/(31-75) 128/39 (05/25  0700) FiO2 (%):  [40 %] 40 % (05/25 0305) Weight:  [120.7 kg (266 lb 1.5 oz)] 120.7 kg (266 lb 1.5 oz) (05/25 0500) Last BM Date: 06/03/17  Intake/Output from previous day: 05/24 0701 - 05/25 0700 In: 2948 [I.V.:1164.4; KW/IO:9735.3; IV Piggyback:150] Out: 2992 [Urine:1525; Drains:515; Stool:2100] Intake/Output this shift: No intake/output data recorded.  Lungs: assisted, coarse b/l  Cardiovascular: bradycardic  Abd: soft, open wound 10cm at widest for skin, fascial sutures seen with some removed, question of bowel scarred to base of wound, right side of subcut with green and necrotic changes, sharply debrided to remove necrotic subcu  Extremities: trace edema  Neuro: intubated sedated, moves all extremities  Lab Results: CBC  Recent Labs    06/03/17 0435 06/04/17 0345  WBC 12.1* 8.0  HGB 7.9* 7.4*  HCT 26.7* 24.8*  PLT 241 186   BMET Recent Labs    06/03/17 0435 06/04/17 0345  NA 144 145  K 4.4 3.9  CL 114* 116*  CO2 21* 22  GLUCOSE 270* 178*  BUN 47* 52*  CREATININE 1.66* 1.79*  CALCIUM 7.5* 7.5*   PT/INR No results for input(s): LABPROT, INR in the last 72 hours. ABG Recent Labs    06/01/17 1403 06/04/17 0352  PHART 7.338* 7.406  HCO3 24.1 20.3    Studies/Results:  Anti-infectives: Anti-infectives (From admission, onward)   Start     Dose/Rate Route Frequency Ordered Stop   05/28/17 1200  vancomycin (VANCOCIN) 1,250 mg in sodium chloride 0.9 % 250 mL IVPB  Status:  Discontinued     1,250 mg 166.7 mL/hr over 90 Minutes Intravenous Every 24 hours 05/27/17 1324 05/30/17 1615   05/27/17 1100  vancomycin (VANCOCIN) 1,250 mg in sodium chloride 0.9 % 250 mL IVPB  Status:  Discontinued     1,250 mg 166.7 mL/hr over 90 Minutes Intravenous Every 12 hours 05/27/17 1011 05/27/17 1324   05/20/17 2230  vancomycin (VANCOCIN) 1,250 mg in sodium chloride 0.9 % 250 mL IVPB  Status:  Discontinued     1,250 mg 166.7 mL/hr over 90 Minutes Intravenous Every 24  hours 05/20/17 2151 05/27/17 1011   05/20/17 1400  piperacillin-tazobactam (ZOSYN) IVPB 3.375 g     3.375 g 12.5 mL/hr over 240 Minutes Intravenous Every 8 hours 05/20/17 1044     05/20/17 0500  anidulafungin (ERAXIS) 100 mg in sodium chloride 0.9 % 100 mL IVPB  Status:  Discontinued     100 mg 78 mL/hr over 100 Minutes Intravenous Every 24 hours 05/19/17 0116 05/29/17 1214   05/19/17 2200  vancomycin (VANCOCIN) 1,500 mg in sodium chloride 0.9 % 500 mL IVPB  Status:  Discontinued     1,500 mg 250 mL/hr over 120 Minutes Intravenous Every 24 hours 05/18/17 2126 05/20/17 2150   05/19/17 0130  anidulafungin (ERAXIS) 200 mg in sodium chloride 0.9 % 200 mL IVPB     200 mg 78 mL/hr over 200 Minutes Intravenous  Once 05/19/17 0116 05/19/17 0520   05/19/17 0130  Ampicillin-Sulbactam (UNASYN) 3 g in sodium chloride 0.9 % 100 mL IVPB  Status:  Discontinued     3 g 200 mL/hr over 30 Minutes Intravenous Every 8 hours 05/19/17 0118 05/20/17 1044   05/18/17 2200  vancomycin (VANCOCIN) 1,750 mg in sodium chloride 0.9 % 500 mL IVPB     1,750 mg 250 mL/hr over 120 Minutes Intravenous  Once 05/18/17 2126 05/19/17 0400   05/17/17 0930  piperacillin-tazobactam (ZOSYN) IVPB 3.375 g  Status:  Discontinued     3.375 g 12.5 mL/hr over 240 Minutes Intravenous Every 8 hours 05/17/17 0836 05/19/17 0118   05/11/17 2200  cefoTEtan (CEFOTAN) 2 g in sodium chloride 0.9 % 100 mL IVPB     2 g 200 mL/hr over 30 Minutes Intravenous Every 12 hours 05/11/17 1839 05/11/17 2034   05/11/17 0600  cefoTEtan in Dextrose 5% (CEFOTAN) IVPB 2 g     2 g Intravenous On call to O.R. 05/10/17 1142 05/11/17 1416      Medications: Scheduled Meds: . chlorhexidine gluconate (MEDLINE KIT)  15 mL Mouth Rinse BID  . Chlorhexidine Gluconate Cloth  6 each Topical Daily  . famotidine  40 mg Per Tube Daily  . feeding supplement (PRO-STAT SUGAR FREE 64)  30 mL Per Tube BID  . insulin aspart  0-15 Units Subcutaneous Q4H  . lip balm  1  application Topical BID  . mouth rinse  15 mL Mouth Rinse 10 times per day  . sodium chloride flush  5 mL Intracatheter Q8H  . vitamin C  500 mg Per Tube Daily   Continuous Infusions: . dexmedetomidine (PRECEDEX) IV infusion 1.199 mcg/kg/hr (06/04/17 0700)  . feeding supplement (VITAL 1.5 CAL) 55 mL/hr at 06/04/17 0700  . norepinephrine (LEVOPHED) Adult infusion Stopped (06/03/17 1324)  . piperacillin-tazobactam (ZOSYN)  IV 3.375 g (06/04/17 0521)   PRN Meds:.acetaminophen (TYLENOL) oral liquid 160 mg/5 mL, dextrose, fentaNYL (SUBLIMAZE) injection, hydrALAZINE, hydrocortisone, hydrocortisone cream, midazolam, [DISCONTINUED] ondansetron **OR** ondansetron (ZOFRAN) IV, prochlorperazine, sodium chloride flush  Mickeal Skinner, MD Pg# [phone] [facility]

## 2017-06-05 ENCOUNTER — Inpatient Hospital Stay (HOSPITAL_COMMUNITY): Payer: Medicare Other

## 2017-06-05 LAB — COMPREHENSIVE METABOLIC PANEL
ALBUMIN: 1.1 g/dL — AB (ref 3.5–5.0)
ALK PHOS: 155 U/L — AB (ref 38–126)
ALT: 31 U/L (ref 17–63)
AST: 41 U/L (ref 15–41)
Anion gap: 6 (ref 5–15)
BILIRUBIN TOTAL: 1.3 mg/dL — AB (ref 0.3–1.2)
BUN: 52 mg/dL — ABNORMAL HIGH (ref 6–20)
CALCIUM: 7.3 mg/dL — AB (ref 8.9–10.3)
CO2: 21 mmol/L — ABNORMAL LOW (ref 22–32)
CREATININE: 1.95 mg/dL — AB (ref 0.61–1.24)
Chloride: 115 mmol/L — ABNORMAL HIGH (ref 101–111)
GFR calc Af Amer: 38 mL/min — ABNORMAL LOW (ref >60)
GFR, EST NON AFRICAN AMERICAN: 33 mL/min — AB (ref >60)
GLUCOSE: 133 mg/dL — AB (ref 65–99)
Potassium: 4.3 mmol/L (ref 3.5–5.1)
Sodium: 142 mmol/L (ref 135–145)
Total Protein: 5.6 g/dL — ABNORMAL LOW (ref 6.5–8.1)

## 2017-06-05 LAB — POCT I-STAT 3, ART BLOOD GAS (G3+)
Acid-base deficit: 5 mmol/L — ABNORMAL HIGH (ref 0.0–2.0)
BICARBONATE: 19.9 mmol/L — AB (ref 20.0–28.0)
O2 Saturation: 96 %
PO2 ART: 86 mmHg (ref 83.0–108.0)
Patient temperature: 37.1
TCO2: 21 mmol/L — AB (ref 22–32)
pCO2 arterial: 37.1 mmHg (ref 32.0–48.0)
pH, Arterial: 7.339 — ABNORMAL LOW (ref 7.350–7.450)

## 2017-06-05 LAB — GLUCOSE, CAPILLARY
GLUCOSE-CAPILLARY: 106 mg/dL — AB (ref 65–99)
GLUCOSE-CAPILLARY: 125 mg/dL — AB (ref 65–99)
GLUCOSE-CAPILLARY: 78 mg/dL (ref 65–99)
Glucose-Capillary: 112 mg/dL — ABNORMAL HIGH (ref 65–99)
Glucose-Capillary: 127 mg/dL — ABNORMAL HIGH (ref 65–99)
Glucose-Capillary: 132 mg/dL — ABNORMAL HIGH (ref 65–99)
Glucose-Capillary: 166 mg/dL — ABNORMAL HIGH (ref 65–99)
Glucose-Capillary: 95 mg/dL (ref 65–99)

## 2017-06-05 LAB — CBC
HEMATOCRIT: 25.1 % — AB (ref 39.0–52.0)
Hemoglobin: 7.4 g/dL — ABNORMAL LOW (ref 13.0–17.0)
MCH: 26 pg (ref 26.0–34.0)
MCHC: 29.5 g/dL — ABNORMAL LOW (ref 30.0–36.0)
MCV: 88.1 fL (ref 78.0–100.0)
PLATELETS: 183 10*3/uL (ref 150–400)
RBC: 2.85 MIL/uL — ABNORMAL LOW (ref 4.22–5.81)
RDW: 26.7 % — AB (ref 11.5–15.5)
WBC: 7.8 10*3/uL (ref 4.0–10.5)

## 2017-06-05 MED ORDER — LACTATED RINGERS IV SOLN
INTRAVENOUS | Status: DC
  Administered 2017-06-05: 22:00:00 via INTRAVENOUS
  Administered 2017-06-05: 100 mL via INTRAVENOUS
  Administered 2017-06-07: 02:00:00 via INTRAVENOUS

## 2017-06-05 NOTE — Progress Notes (Signed)
25 Days Post-Op   Subjective/Chief Complaint: No new events   Objective: Vital signs in last 24 hours: Temp:  [98.1 F (36.7 C)-100.2 F (37.9 C)] 98.1 F (36.7 C) (05/26 0700) Pulse Rate:  [46-61] 47 (05/26 0700) Resp:  [16-29] 16 (05/26 0340) BP: (80-117)/(41-58) 89/52 (05/26 0700) SpO2:  [97 %-100 %] 99 % (05/26 0700) Arterial Line BP: (88-126)/(27-40) 88/36 (05/26 0700) FiO2 (%):  [40 %] 40 % (05/26 0340) Weight:  [118.3 kg (260 lb 12.9 oz)] 118.3 kg (260 lb 12.9 oz) (05/26 0400) Last BM Date: 06/03/17  Intake/Output from previous day: 05/25 0701 - 05/26 0700 In: 1099.9 [I.V.:776.9; NG/GT:168; IV Piggyback:155] Out: 1945 [Urine:1110; Drains:135; Stool:700] Intake/Output this shift: No intake/output data recorded.  Lungs: assisted, coarse b/l  Cardiovascular: bradycardic  Abd: soft, open wound 10cm at widest for skin, fascial sutures seen with some removed, question of bowel scarred to base of wound, right side of subcut with green and necrotic changes,   Extremities: trace edema  Neuro: intubated sedated, moves all extremities    Lab Results:  Recent Labs    06/04/17 0345 06/05/17 0326  WBC 8.0 7.8  HGB 7.4* 7.4*  HCT 24.8* 25.1*  PLT 186 183   BMET Recent Labs    06/04/17 0345 06/05/17 0326  NA 145 142  K 3.9 4.3  CL 116* 115*  CO2 22 21*  GLUCOSE 178* 133*  BUN 52* 52*  CREATININE 1.79* 1.95*  CALCIUM 7.5* 7.3*   PT/INR No results for input(s): LABPROT, INR in the last 72 hours. ABG Recent Labs    06/04/17 0352 06/05/17 0327  PHART 7.406 7.339*  HCO3 20.3 19.9*    Studies/Results: No results found.  Anti-infectives: Anti-infectives (From admission, onward)   Start     Dose/Rate Route Frequency Ordered Stop   05/28/17 1200  vancomycin (VANCOCIN) 1,250 mg in sodium chloride 0.9 % 250 mL IVPB  Status:  Discontinued     1,250 mg 166.7 mL/hr over 90 Minutes Intravenous Every 24 hours 05/27/17 1324 05/30/17 1615   05/27/17 1100   vancomycin (VANCOCIN) 1,250 mg in sodium chloride 0.9 % 250 mL IVPB  Status:  Discontinued     1,250 mg 166.7 mL/hr over 90 Minutes Intravenous Every 12 hours 05/27/17 1011 05/27/17 1324   05/20/17 2230  vancomycin (VANCOCIN) 1,250 mg in sodium chloride 0.9 % 250 mL IVPB  Status:  Discontinued     1,250 mg 166.7 mL/hr over 90 Minutes Intravenous Every 24 hours 05/20/17 2151 05/27/17 1011   05/20/17 1400  piperacillin-tazobactam (ZOSYN) IVPB 3.375 g     3.375 g 12.5 mL/hr over 240 Minutes Intravenous Every 8 hours 05/20/17 1044     05/20/17 0500  anidulafungin (ERAXIS) 100 mg in sodium chloride 0.9 % 100 mL IVPB  Status:  Discontinued     100 mg 78 mL/hr over 100 Minutes Intravenous Every 24 hours 05/19/17 0116 05/29/17 1214   05/19/17 2200  vancomycin (VANCOCIN) 1,500 mg in sodium chloride 0.9 % 500 mL IVPB  Status:  Discontinued     1,500 mg 250 mL/hr over 120 Minutes Intravenous Every 24 hours 05/18/17 2126 05/20/17 2150   05/19/17 0130  anidulafungin (ERAXIS) 200 mg in sodium chloride 0.9 % 200 mL IVPB     200 mg 78 mL/hr over 200 Minutes Intravenous  Once 05/19/17 0116 05/19/17 0520   05/19/17 0130  Ampicillin-Sulbactam (UNASYN) 3 g in sodium chloride 0.9 % 100 mL IVPB  Status:  Discontinued  3 g 200 mL/hr over 30 Minutes Intravenous Every 8 hours 05/19/17 0118 05/20/17 1044   05/18/17 2200  vancomycin (VANCOCIN) 1,750 mg in sodium chloride 0.9 % 500 mL IVPB     1,750 mg 250 mL/hr over 120 Minutes Intravenous  Once 05/18/17 2126 05/19/17 0400   05/17/17 0930  piperacillin-tazobactam (ZOSYN) IVPB 3.375 g  Status:  Discontinued     3.375 g 12.5 mL/hr over 240 Minutes Intravenous Every 8 hours 05/17/17 0836 05/19/17 0118   05/11/17 2200  cefoTEtan (CEFOTAN) 2 g in sodium chloride 0.9 % 100 mL IVPB     2 g 200 mL/hr over 30 Minutes Intravenous Every 12 hours 05/11/17 1839 05/11/17 2034   05/11/17 0600  cefoTEtan in Dextrose 5% (CEFOTAN) IVPB 2 g     2 g Intravenous On call to O.R.  05/10/17 1142 05/11/17 1416      Assessment/Plan: s/p Procedure(s): Colon Resection Sigmoid Low Anterior Resection with Staple Colon Anastomosis Primary repair umbilical hernia Creation diverting loop ILEOSTOMY 05/11/2017 VDRF Physical decondition, marginal functional status pre-op   -continue dressing changes - as necrotic subcutaneous tissue improves, patient may benefit from wound VAC with white sponge to expedite granulation tissue formation over exposed bowel.  Reoperation at this time would be very difficult due to the amount of adhesions.  Malnutrition and chronic debilitation have contributed to the fascial dehiscence. -continue tube feeds at goal -continue perihepatic drain -ileostomy - 700 cc recorded -failed extubation last week with reintubation -anastomotic leak controlled with diversion, WBC trending down, no fevers -wound dehiscence with granulated base, concern for bowel present for a 3x4cm area at base of wound.    LOS: 30 days    Maia Petties 06/05/2017

## 2017-06-05 NOTE — Progress Notes (Addendum)
PULMONARY / CRITICAL CARE MEDICINE   Name: Jesse Le MRN: [id number] DOB: November 30, 1945    ADMISSION DATE:  05/06/2017 CONSULTATION DATE:  05/18/2017  REFERRING MD:  Dr. Terrence Dupont  CHIEF COMPLAINT:  Hypotension  HISTORY OF PRESENT ILLNESS:   72 yo male presented with abdominal pain, weight loss and intermittent hematochezia.  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  Had low anterior resection with primary anastomosis with diverting loop ileostomy.  Developed hypotension, hypoglycemia 5/07.  Developed hypotension and altered mental status 5/08 and transferred to ICU. Pt has an anastomotic leak, drain placed by surgery PMHx of CAD, DM, TAA.  5/22 Re-consult for progressive hypoxia throughout the night and ongoing confusion, ~70's on RA, placed on NRB then to BiPAP.  CXR- rotated film worsening R multifocal consolidation, small pleural effusions.  Net +21 L.  Hypertensive, afebrile x 48 hours>> Intubated  Subjective: Off norepi since  5/24 am Tolerated PEEP of 5 T Max 100.2 + 11 L since admission  VITAL SIGNS: BP (!) 89/52   Pulse (!) 47   Temp 98.1 F (36.7 C)   Resp 16   Ht 5\' 10"  (1.778 m)   Wt 260 lb 12.9 oz (118.3 kg)   SpO2 99%   BMI 37.42 kg/m   INTAKE / OUTPUT: I/O last 3 completed shifts: In: 2368 [I.V.:1195; NG/GT:918; IV Piggyback:255] Out: 7425 [Urine:1810; Drains:400; Stool:1300]  PHYSICAL EXAMINATION: General:  Elderly male, intubated, sedated, on CPAP/PS,in NAD HEENT: Atraumatic, normocephalic, ETT secure and  in place, neck thick no LAD , MM pink/moist, Neuro: Sedated, arouses to voice, follows some commands, MAE x 4 CV: Regular,  Bradycardia with HR in the 40's, S1, S2, No RMG PULM: Bilateral chest excursion, Coarse breath sounds throughout, diminished per bases due to large abdomen/ wound GI: Ileostomy with 700 cc out, JP drain in place averaging   30 cc drainage per hour, abdominal binder in place, hypoactive bowel sounds,  dressing CDI, Stoma pink Extremities: Diffuse 2-3 +edema, nailbeds are pink with  Brisk refill Skin: No obvious rashes, lesions, or ulcerations, large abdominal wound, ileostomy  LABS:  BMET Recent Labs  Lab 06/03/17 0435 06/04/17 0345 06/05/17 0326  NA 144 145 142  K 4.4 3.9 4.3  CL 114* 116* 115*  CO2 21* 22 21*  BUN 47* 52* 52*  CREATININE 1.66* 1.79* 1.95*  GLUCOSE 270* 178* 133*    Electrolytes Recent Labs  Lab 06/01/17 0343 06/02/17 0500 06/03/17 0435 06/04/17 0345 06/05/17 0326  CALCIUM 7.7* 7.5* 7.5* 7.5* 7.3*  MG 2.1 2.3 2.4 2.5*  --   PHOS 3.1 5.3* 3.3  --   --     CBC Recent Labs  Lab 06/03/17 0435 06/04/17 0345 06/05/17 0326  WBC 12.1* 8.0 7.8  HGB 7.9* 7.4* 7.4*  HCT 26.7* 24.8* 25.1*  PLT 241 186 183    Coag's No results for input(s): APTT, INR in the last 168 hours.  Sepsis Markers No results for input(s): LATICACIDVEN, PROCALCITON, O2SATVEN in the last 168 hours.  ABG Recent Labs  Lab 06/01/17 1403 06/04/17 0352 06/05/17 0327  PHART 7.338* 7.406 7.339*  PCO2ART 44.7 32.5 37.1  PO2ART 96.0 72.0* 86.0    Liver Enzymes Recent Labs  Lab 05/30/17 0637 06/02/17 0500 06/05/17 0326  AST 47* 33 41  ALT 66* 38 31  ALKPHOS 269* 126 155*  BILITOT 1.4* 1.2 1.3*  ALBUMIN 1.2* 1.2* 1.1*    Cardiac Enzymes No results for input(s): TROPONINI, PROBNP in the  last 168 hours.  Glucose Recent Labs  Lab 06/04/17 0823 06/04/17 1558 06/04/17 2018 06/04/17 2358 06/05/17 0324 06/05/17 0805  GLUCAP 90 156* 166* 112* 132* 125*    Imaging Dg Chest Port 1 View  Result Date: 06/05/2017 CLINICAL DATA:  Respiratory failure. EXAM: PORTABLE CHEST 1 VIEW COMPARISON:  Radiograph of Jun 03, 2017. FINDINGS: Stable cardiomegaly. Thoracic aortic stent is again noted. Endotracheal and feeding tubes are unchanged in position. Right internal jugular catheter is again noted. No pneumothorax is noted. Stable right upper lobe airspace opacity is noted  consistent with pneumonia. Mild left basilar atelectasis is noted with associated pleural effusion. Bony thorax is unremarkable. IMPRESSION: Stable support apparatus. Stable right upper lobe airspace opacity is noted consistent with pneumonia. Mild left basilar atelectasis is noted with probable small pleural effusion. Electronically Signed   By: Marijo Conception, M.D.   On: 06/05/2017 08:21   STUDIES:  CT abd 5/09 >> discontinuity in the posterior wall just above the anastomosis with feculent appearing material and gas in the pelvis, measuring up to 5x 3 cm anterior and superior to the sigmoid colon.  Fluid collection right lower quadrant measuring up to 6.4 x 3.5 cm. Moderate pneumoperitoneum.   CULTURES: MRSA PCR 5/8 >> neg Blood culture 5/8 >> neg Abd ascess 5/16 >> pseudomonas > pan sensitive Blood culture 5/22>> Urine culture 5/22>> No growth Bronchial alveolar lavage 5/22>> Candida Glabrata>> suspect contaminent  ANTIBIOTICS: Zosyn 5/7 > 5/8 Zosyn 5/10>> Unasyn 5/8 > 5/10 Vancomycin 5/7 >5/20 Eraxis 5/8 > 5/18  LINES/TUBES: Lt IJ CVL  5/8 > 5/10 JP Drain 5/1> RUE PICC 5/10 >>5/23 Right IJ CVC 5/22>>  DISCUSSION: 72 yo male with sepsis from peritonitis after surgery for colon cancer and CT abd +  for anastomotic leak.>> Drain placed 5/1.  Pt with acute hypoxic respiratory failure on 5/22 requiring intubation.  Concern for Aspiration.  ASSESSMENT / PLAN:  Acute Hypoxic respiratory insufficiency 2/2 R lung consolidation HAP vs aspiration pneumonitis  Continue PRVC 8 cc/kg, wean PEEP from 10 to 5 5/24 On full support this am due to HR of 44  CXR 5/26>> stable RUL pneumia  Plan: CXR prn Titrate oxygen and PEEP for sats > 92% VAP prevention bundle Pulmonary hygiene Zosyn as below Push spontaneous breathing trials as he is  Tolerating  lower PEEP May need trach if current weaning is unsuccessful  Sepsis with septic shock from peritonitis- resolved  Abd abscess w/  pseudomonas  Colon cancer s/p low anterior resection with ileostomy w/anastomosis leak  UA w/ +Leukocytes & many bacteria>> UC negative on 5/23 ( Leukocytosis resolving) Probable aspiration pneumonia Wound debridement / dressing change per surgery 5/25/am Plan: Continue Zosyn given concern for new pulmonary infection Trend Fever and WBC (WBC  Down trending 5/26) Follow culture data>>reculture as is clinically indicated Abdominal drain in place, surgery following  Acute renal failure from ATN. Continued increase in creatinine overnight to 1.95 on 5/26 Plan:   Diuresis as he can tolerate, hematin Amick instability has precluded this recently Follow urine output, serum creatinine, electrolytes Avoid nephrotoxins Replete electrolytes prn  Anemia HGB 7.4 >> ( ? if hemo dilutional) Plan Follow CBC Transfusion threshold hemoglobin < 7 Monitor for any obvious bleeding  GI Ilesotomy/ JP/ Open abdominal wound Hypoalbuminemia TNA stopped 5/24 TF started 5/24>> vomited large amount 5/25 am Bicarb down trending on CMET, and ABG ? High volume loss from illeostomy Plan: TF held x 24 hours>> resuming at low rate 5/26 ( Goal for  30 cc hour) CMET 5/26 to monitor albumin Monitor ileostomy for high output>> Creatinine increase>> decrease in bicarb May need to consider increasing IVF as replacement ABG in am and prn Consider immodium/ lomotil per Surgery if needed     DM type II. Hypoglycemia in setting of sepsis- resolved now hyperglycemic  ICU hyperglycemia protocol Sliding scale insulin  Acute metabolic encephalopathy 2nd to sepsis- ongoing RASS goal 0 to -1 Continue Precedex drip>> wean as able Minimize/ Avoid  other sedating medications if able Daily wake up assessment  Hypertension>>resolved, now hypotensive likely in setting of vent/sedation Bradycardia>>likely in setting or precedex gtt Norepinephrine off since 5/24 Metoprolol on hold Minimize sedation >> likely  contributing to his hypotension/need for pressors   VTE prophylaxis -SCD's only (given hx of retroperitoneal bleed) SUP-Pepcid Nutrition - Transitioned to TF 5/24 Goals of care- Full code  Prognosis continued to be  guarded  Family- No family present at bedside 5/26   Magdalen Spatz, AGACNP-BC 06/05/2017, 9:25 AM [facility] [phone]

## 2017-06-05 NOTE — Progress Notes (Signed)
Ref: Jesse Forward, MD   Subjective:  Remains sedated and intubated. T max 100.2 degree F. Blood pressure and Hgb remains low.  Objective:  Vital Signs in the last 24 hours: Temp:  [97.9 F (36.6 C)-100.2 F (37.9 C)] 97.9 F (36.6 C) (05/26 1138) Pulse Rate:  [45-57] 49 (05/26 1244) Cardiac Rhythm: Sinus bradycardia (05/26 0100) Resp:  [16-27] 16 (05/26 1244) BP: (80-107)/(30-58) 94/52 (05/26 1244) SpO2:  [97 %-100 %] 97 % (05/26 1244) Arterial Line BP: (88-120)/(27-40) 95/30 (05/26 1000) FiO2 (%):  [40 %] 40 % (05/26 1244) Weight:  [118.3 kg (260 lb 12.9 oz)] 118.3 kg (260 lb 12.9 oz) (05/26 0400)  Physical Exam: BP Readings from Last 1 Encounters:  06/05/17 (!) 94/52     Wt Readings from Last 1 Encounters:  06/05/17 118.3 kg (260 lb 12.9 oz)    Weight change: -2.4 kg (-5 lb 4.7 oz) Body mass index is 37.42 kg/m. HEENT: [place]/AT, Eyes-Brown, Conjunctiva-Pale, Sclera-Non-icteric Neck: No JVD, No bruit, Trachea midline. Lungs:  Clearing, Bilateral. Cardiac:  Regular rhythm, normal S1 and S2, no S3. II/VI systolic murmur. Abdomen:  Soft, non-tender. BS present. Extremities:  2 + edema present. No cyanosis. No clubbing. CNS: AxOx3, Cranial nerves grossly intact, moves all 4 extremities.  Skin: Warm and dry.   Intake/Output from previous day: 05/25 0701 - 05/26 0700 In: 1099.9 [I.V.:776.9; NG/GT:168; IV Piggyback:155] Out: 1945 [Urine:1110; Drains:135; Stool:700]    Lab Results: BMET    Component Value Date/Time   NA 142 06/05/2017 0326   NA 145 06/04/2017 0345   NA 144 06/03/2017 0435   K 4.3 06/05/2017 0326   K 3.9 06/04/2017 0345   K 4.4 06/03/2017 0435   CL 115 (H) 06/05/2017 0326   CL 116 (H) 06/04/2017 0345   CL 114 (H) 06/03/2017 0435   CO2 21 (L) 06/05/2017 0326   CO2 22 06/04/2017 0345   CO2 21 (L) 06/03/2017 0435   GLUCOSE 133 (H) 06/05/2017 0326   GLUCOSE 178 (H) 06/04/2017 0345   GLUCOSE 270 (H) 06/03/2017 0435   BUN 52 (H) 06/05/2017 0326   BUN 52 (H) 06/04/2017 0345   BUN 47 (H) 06/03/2017 0435   CREATININE 1.95 (H) 06/05/2017 0326   CREATININE 1.79 (H) 06/04/2017 0345   CREATININE 1.66 (H) 06/03/2017 0435   CALCIUM 7.3 (L) 06/05/2017 0326   CALCIUM 7.5 (L) 06/04/2017 0345   CALCIUM 7.5 (L) 06/03/2017 0435   GFRNONAA 33 (L) 06/05/2017 0326   GFRNONAA 36 (L) 06/04/2017 0345   GFRNONAA 40 (L) 06/03/2017 0435   GFRAA 38 (L) 06/05/2017 0326   GFRAA 42 (L) 06/04/2017 0345   GFRAA 46 (L) 06/03/2017 0435   CBC    Component Value Date/Time   WBC 7.8 06/05/2017 0326   RBC 2.85 (L) 06/05/2017 0326   HGB 7.4 (L) 06/05/2017 0326   HCT 25.1 (L) 06/05/2017 0326   PLT 183 06/05/2017 0326   MCV 88.1 06/05/2017 0326   MCH 26.0 06/05/2017 0326   MCHC 29.5 (L) 06/05/2017 0326   RDW 26.7 (H) 06/05/2017 0326   LYMPHSABS 0.8 05/30/2017 0344   MONOABS 1.0 05/30/2017 0344   EOSABS 0.0 05/30/2017 0344   BASOSABS 0.0 05/30/2017 0344   HEPATIC Function Panel Recent Labs    05/30/17 0637 06/02/17 0500 06/05/17 0326  PROT 5.3* 5.5* 5.6*   HEMOGLOBIN A1C No components found for: HGA1C,  MPG CARDIAC ENZYMES No results found for: CKTOTAL, CKMB, CKMBINDEX, TROPONINI BNP No results for input(s): PROBNP in the  last 8760 hours. TSH No results for input(s): TSH in the last 8760 hours. CHOLESTEROL No results for input(s): CHOL in the last 8760 hours.  Scheduled Meds: . chlorhexidine gluconate (MEDLINE KIT)  15 mL Mouth Rinse BID  . Chlorhexidine Gluconate Cloth  6 each Topical Daily  . famotidine  40 mg Per Tube Daily  . feeding supplement (PRO-STAT SUGAR FREE 64)  30 mL Per Tube BID  . feeding supplement (VITAL 1.5 CAL)  1,000 mL Per Tube Q24H  . insulin aspart  0-15 Units Subcutaneous Q4H  . lip balm  1 application Topical BID  . mouth rinse  15 mL Mouth Rinse 10 times per day  . sodium chloride flush  5 mL Intracatheter Q8H  . vitamin C  500 mg Per Tube Daily   Continuous Infusions: . dexmedetomidine (PRECEDEX) IV infusion  0.8 mcg/kg/hr (06/05/17 1119)  . lactated ringers 100 mL (06/05/17 1121)  . norepinephrine (LEVOPHED) Adult infusion Stopped (06/03/17 1324)  . piperacillin-tazobactam (ZOSYN)  IV Stopped (06/05/17 1028)   PRN Meds:.acetaminophen (TYLENOL) oral liquid 160 mg/5 mL, dextrose, fentaNYL (SUBLIMAZE) injection, hydrALAZINE, hydrocortisone, hydrocortisone cream, midazolam, [DISCONTINUED] ondansetron **OR** ondansetron (ZOFRAN) IV, prochlorperazine, sodium chloride flush  Assessment/Plan: Septic shock Acute intraabdominal infection from colon anastomotic leak Abdominal wound dehiscence Acute respiratory failure CAD DM, II COPD Obesity Hypoalbuminemia  Continue follow up with CCM and Surgery.   LOS: 30 days    [name]  MD  06/05/2017, 2:08 PM

## 2017-06-05 NOTE — Progress Notes (Signed)
I was called to the bedside by RN  for family update. Daughter lives in [place]. She was unaware of how sick her father has been. The patient's significant other of 20 years is not the daughters Mother. We discussed the possibility of need for Trach and what quality of life may be in a LTAC. We discussed Code Status. Daughter states she needs to have a conversation with her father's significant other regarding whether or not any living will exists, or if he had ever expressed his desires in the event of chronic illness that impacts his quality of life. He is currently on precedex and I explained these type discussions needed to be had off any type of sedation . We will address further when daughter has spoken with significant other. At present she does not want to change code status.I told her we would be available for further discussion as needed, and that we would  Call her if her father takes a turn for the worse.She verbalized understanding.

## 2017-06-06 ENCOUNTER — Inpatient Hospital Stay (HOSPITAL_COMMUNITY): Payer: Medicare Other

## 2017-06-06 DIAGNOSIS — R634 Abnormal weight loss: Secondary | ICD-10-CM

## 2017-06-06 DIAGNOSIS — K921 Melena: Secondary | ICD-10-CM

## 2017-06-06 LAB — POCT I-STAT 3, ART BLOOD GAS (G3+)
Acid-base deficit: 5 mmol/L — ABNORMAL HIGH (ref 0.0–2.0)
Bicarbonate: 19.2 mmol/L — ABNORMAL LOW (ref 20.0–28.0)
O2 Saturation: 95 %
PCO2 ART: 29.3 mmHg — AB (ref 32.0–48.0)
PH ART: 7.425 (ref 7.350–7.450)
TCO2: 20 mmol/L — AB (ref 22–32)
pO2, Arterial: 73 mmHg — ABNORMAL LOW (ref 83.0–108.0)

## 2017-06-06 LAB — CULTURE, BLOOD (ROUTINE X 2)
CULTURE: NO GROWTH
Culture: NO GROWTH
SPECIAL REQUESTS: ADEQUATE
SPECIAL REQUESTS: ADEQUATE

## 2017-06-06 LAB — GLUCOSE, CAPILLARY
GLUCOSE-CAPILLARY: 126 mg/dL — AB (ref 65–99)
GLUCOSE-CAPILLARY: 129 mg/dL — AB (ref 65–99)
GLUCOSE-CAPILLARY: 136 mg/dL — AB (ref 65–99)
GLUCOSE-CAPILLARY: 140 mg/dL — AB (ref 65–99)
GLUCOSE-CAPILLARY: 163 mg/dL — AB (ref 65–99)
GLUCOSE-CAPILLARY: 84 mg/dL (ref 65–99)

## 2017-06-06 LAB — BLOOD GAS, ARTERIAL
ACID-BASE DEFICIT: 6.5 mmol/L — AB (ref 0.0–2.0)
Bicarbonate: 17.6 mmol/L — ABNORMAL LOW (ref 20.0–28.0)
FIO2: 40
MECHVT: 580 mL
O2 Saturation: 97.8 %
PEEP: 5 cmH2O
PH ART: 7.389 (ref 7.350–7.450)
Patient temperature: 98.6
RATE: 16 resp/min
pCO2 arterial: 29.8 mmHg — ABNORMAL LOW (ref 32.0–48.0)
pO2, Arterial: 100 mmHg (ref 83.0–108.0)

## 2017-06-06 LAB — CBC
HEMATOCRIT: 25.8 % — AB (ref 39.0–52.0)
HEMOGLOBIN: 7.7 g/dL — AB (ref 13.0–17.0)
MCH: 26.2 pg (ref 26.0–34.0)
MCHC: 29.8 g/dL — ABNORMAL LOW (ref 30.0–36.0)
MCV: 87.8 fL (ref 78.0–100.0)
Platelets: 169 10*3/uL (ref 150–400)
RBC: 2.94 MIL/uL — AB (ref 4.22–5.81)
RDW: 26.9 % — ABNORMAL HIGH (ref 11.5–15.5)
WBC: 5.9 10*3/uL (ref 4.0–10.5)

## 2017-06-06 LAB — COMPREHENSIVE METABOLIC PANEL
ALK PHOS: 143 U/L — AB (ref 38–126)
ALT: 30 U/L (ref 17–63)
ANION GAP: 7 (ref 5–15)
AST: 38 U/L (ref 15–41)
Albumin: <1 g/dL — ABNORMAL LOW (ref 3.5–5.0)
BUN: 52 mg/dL — AB (ref 6–20)
CALCIUM: 7.7 mg/dL — AB (ref 8.9–10.3)
CO2: 22 mmol/L (ref 22–32)
CREATININE: 1.98 mg/dL — AB (ref 0.61–1.24)
Chloride: 120 mmol/L — ABNORMAL HIGH (ref 101–111)
GFR calc Af Amer: 37 mL/min — ABNORMAL LOW (ref >60)
GFR calc non Af Amer: 32 mL/min — ABNORMAL LOW (ref >60)
GLUCOSE: 142 mg/dL — AB (ref 65–99)
Potassium: 4.5 mmol/L (ref 3.5–5.1)
SODIUM: 149 mmol/L — AB (ref 135–145)
Total Bilirubin: 1.1 mg/dL (ref 0.3–1.2)
Total Protein: 5.5 g/dL — ABNORMAL LOW (ref 6.5–8.1)

## 2017-06-06 MED ORDER — FREE WATER
100.0000 mL | Freq: Three times a day (TID) | Status: DC
  Administered 2017-06-06 – 2017-06-07 (×3): 100 mL

## 2017-06-06 NOTE — Plan of Care (Signed)
  Problem: Clinical Measurements: Goal: Ability to maintain clinical measurements within normal limits will improve 06/06/2017 0541 by Kriste Basque, RN Outcome: Progressing 06/06/2017 0540 by Roland Rack D, RN Outcome: Progressing Goal: Will remain free from infection 06/06/2017 0541 by Kriste Basque, RN Outcome: Progressing 06/06/2017 0540 by Roland Rack D, RN Outcome: Progressing Goal: Diagnostic test results will improve 06/06/2017 0541 by Kriste Basque, RN Outcome: Progressing 06/06/2017 0540 by Roland Rack D, RN Outcome: Progressing Goal: Respiratory complications will improve 06/06/2017 0541 by Kriste Basque, RN Outcome: Progressing 06/06/2017 0540 by Roland Rack D, RN Outcome: Progressing Goal: Cardiovascular complication will be avoided 06/06/2017 0541 by Kriste Basque, RN Outcome: Progressing 06/06/2017 0540 by Roland Rack D, RN Outcome: Progressing   Problem: Nutrition: Goal: Adequate nutrition will be maintained 06/06/2017 0541 by Kriste Basque, RN Outcome: Progressing 06/06/2017 0540 by Roland Rack D, RN Outcome: Progressing   Problem: Coping: Goal: Level of anxiety will decrease 06/06/2017 0541 by Kriste Basque, RN Outcome: Progressing 06/06/2017 0540 by Roland Rack D, RN Outcome: Progressing   Problem: Elimination: Goal: Will not experience complications related to urinary retention 06/06/2017 0541 by Kriste Basque, RN Outcome: Progressing 06/06/2017 0540 by Roland Rack D, RN Outcome: Progressing   Problem: Pain Managment: Goal: General experience of comfort will improve 06/06/2017 0541 by Kriste Basque, RN Outcome: Progressing 06/06/2017 0540 by Roland Rack D, RN Outcome: Progressing   Problem: Safety: Goal: Ability to remain free from injury will improve 06/06/2017 0541 by Kriste Basque, RN Outcome: Progressing 06/06/2017 0540 by Roland Rack D, RN Outcome: Progressing   Problem: Skin Integrity: Goal: Risk for impaired skin integrity will decrease 06/06/2017 0541 by Kriste Basque, RN Outcome: Progressing 06/06/2017 0540 by Kriste Basque, RN Outcome: Progressing   Problem: Activity: Goal: Ability to tolerate increased activity will improve 06/06/2017 0541 by Kriste Basque, RN Outcome: Progressing 06/06/2017 0540 by Roland Rack D, RN Outcome: Progressing   Problem: Respiratory: Goal: Ability to maintain a clear airway and adequate ventilation will improve 06/06/2017 0541 by Kriste Basque, RN Outcome: Progressing 06/06/2017 0540 by Roland Rack D, RN Outcome: Progressing   Problem: Role Relationship: Goal: Method of communication will improve 06/06/2017 0541 by Kriste Basque, RN Outcome: Progressing 06/06/2017 0540 by Kriste Basque, RN Outcome: Progressing

## 2017-06-06 NOTE — Progress Notes (Signed)
[facility]    Date of Admission:  05/06/2017   Total days of antibiotics 20           ID: Jesse Le is a 72 y.o. 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 with primary anastomosis with diverting loop ileostomy c/b anastomotic leak, drain placed by surgery, intubated due to aspiration still on FIo2 40%   Principal Problem:   Cancer of sigmoid s/p LAR rectosigmoid resection 05/11/2017 Active Problems:   Generalized abdominal pain   Iron deficiency anemia due to chronic blood loss   Abnormal CT scan, sigmoid colon   Acute blood loss anemia   Ventral hernia s/p primary repair 05/11/2017   Physical deconditioning   Diabetes mellitus without complication (HCC)   CAD (coronary atherosclerotic disease)   Septic shock (HCC)   Hypoglycemia after GI (gastrointestinal) surgery   Colostomy present ([place])   Acute respiratory failure with hypoxemia (HCC)   Atelectasis    Subjective: Afebrile, remains intubated, not on pressors.   Labs reveal leukocytosis is improved. Now wbc at 5.9K  ROS: unable to obtain  Medications:  . chlorhexidine gluconate (MEDLINE KIT)  15 mL Mouth Rinse BID  . Chlorhexidine Gluconate Cloth  6 each Topical Daily  . famotidine  40 mg Per Tube Daily  . feeding supplement (PRO-STAT SUGAR FREE 64)  30 mL Per Tube BID  . feeding supplement (VITAL 1.5 CAL)  1,000 mL Per Tube Q24H  . free water  100 mL Per Tube Q8H  . insulin aspart  0-15 Units Subcutaneous Q4H  . lip balm  1 application Topical BID  . mouth rinse  15 mL Mouth Rinse 10 times per day  . sodium chloride flush  5 mL Intracatheter Q8H  . vitamin C  500 mg Per Tube Daily    Objective: Vital signs in last 24 hours: Temp:  [97.7 F (36.5 C)-98.8 F (37.1 C)] 98.4 F (36.9 C) (05/27 0900) Pulse Rate:  [48-61] 61 (05/27 0900) Resp:  [16-24] 22 (05/27 0802) BP:  (85-109)/(42-72) 99/63 (05/27 0900) SpO2:  [84 %-100 %] 99 % (05/27 0900) Arterial Line BP: (87-131)/(30-59) 131/46 (05/27 0900) FiO2 (%):  [40 %] 40 % (05/27 0900) Weight:  [268 lb 1.3 oz (121.6 kg)] 268 lb 1.3 oz (121.6 kg) (05/27 0500) Physical Exam  Constitutional: intubated, sedated He appears well-developed and well-nourished. No distress.  HENT: bridled dobhoff tube for enteral feeds Mouth/Throat: OETT in place Cardiovascular: Normal rate, regular rhythm and normal heart sounds. Exam reveals no gallop and no friction rub.  No murmur heard.  Pulmonary/Chest: Effort normal and breath sounds normal. No respiratory distress. He has no wheezes.  Abdominal: distended. Decreased BS Ext: anasarca appearance +2 pitting edema     Lab Results Recent Labs    06/05/17 0326 06/06/17 0450  WBC 7.8 5.9  HGB 7.4* 7.7*  HCT 25.1* 25.8*  NA 142 149*  K 4.3 4.5  CL 115* 120*  CO2 21* 22  BUN 52* 52*  CREATININE 1.95* 1.98*   Liver Panel Recent Labs    06/05/17 0326 06/06/17 0450  PROT 5.6* 5.5*  ALBUMIN 1.1* <1.0*  AST 41 38  ALT 31 30  ALKPHOS 155* 143*  BILITOT 1.3* 1.1    Microbiology: reviewed Studies/Results: Dg Chest Port 1 View  Result Date: 06/06/2017 CLINICAL DATA:  Respiratory failure.  Follow-up exam. EXAM: PORTABLE CHEST 1  VIEW COMPARISON:  06/05/2017 and older exams. FINDINGS: There is persistent patchy consolidation in the right upper lobe and at the left lung base, similar to the most recent prior exam. Right upper lobe consolidation has improved compared to exams from 06/01/2017 and 06/03/2017. No new lung abnormalities. Probable small effusions. No pneumothorax. Endotracheal tube, nasal/orogastric tube and right internal jugular central venous line are stable. IMPRESSION: 1. No significant change from the previous day's study. 2. Patchy right upper lobe airspace opacity consistent with residual pneumonia. Left lung base opacity may reflect additional pneumonia,  atelectasis or a combination. 3. Support apparatus is stable and well positioned. Electronically Signed   By: Lajean Manes M.D.   On: 06/06/2017 07:52   Dg Chest Port 1 View  Result Date: 06/05/2017 CLINICAL DATA:  Respiratory failure. EXAM: PORTABLE CHEST 1 VIEW COMPARISON:  Radiograph of Jun 03, 2017. FINDINGS: Stable cardiomegaly. Thoracic aortic stent is again noted. Endotracheal and feeding tubes are unchanged in position. Right internal jugular catheter is again noted. No pneumothorax is noted. Stable right upper lobe airspace opacity is noted consistent with pneumonia. Mild left basilar atelectasis is noted with associated pleural effusion. Bony thorax is unremarkable. IMPRESSION: Stable support apparatus. Stable right upper lobe airspace opacity is noted consistent with pneumonia. Mild left basilar atelectasis is noted with probable small pleural effusion. Electronically Signed   By: Marijo Conception, M.D.   On: 06/05/2017 08:21   CT from 5/23 1. Progressive multifocal loculated fluid and gas collections throughout much of the peritoneal space compared to 05/25/2017 despite the well-positioned right perihepatic percutaneous drainage catheter suggesting persistent anastomotic leak and/or peritoneal abscess. A very large fluid and gas collection now extends from the right subdiaphragmatic space inferiorly and laterally spanning across the entirety of the upper abdomen. There is a second large fluid collection in the left lower quadrant extending into the small bowel mesenteries which has also enlarged compared to prior. There may be some communication between these 2 collections. 2. The tip of the enteric feeding tube is present within the stomach. 3. Improving right psoas hematoma.  Assessment/Plan: Intra-abdominal infection/abscess= based upon repeat CT on 5/23, appears to have larger intra-abdominal fluid collection- in both right periphepatic areas as well as left lower quadrant.  Defer to general surgery to how these should be drain- if need for 2nd drain placement be needed  Recommend to continue on piptazo for addn 10d. Would repeat imaging in the next week to see if fluid collections are improving. Depending on the repeat CT findings, may need to extend abtx vs observing off of abtx  Will sign off. Call us back into care at 10 d point if need further guidance.  Elzie [facility] Cell: [phone] Pager: [phone]  06/06/2017, 10:44 AM

## 2017-06-06 NOTE — Progress Notes (Deleted)
PULMONARY / CRITICAL CARE MEDICINE   Name: Jesse Le MRN: [id number] DOB: 04-27-1945    ADMISSION DATE:  05/06/2017 CONSULTATION DATE:  05/18/2017  REFERRING MD:  Dr. Terrence Dupont  CHIEF COMPLAINT:  Hypotension  HISTORY OF PRESENT ILLNESS:   72 yo male presented with abdominal pain, weight loss and intermittent hematochezia.  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  Had low anterior resection with primary anastomosis with diverting loop ileostomy.  Developed hypotension, hypoglycemia 5/07.  Developed hypotension and altered mental status 5/08 and transferred to ICU. Pt has an anastomotic leak, drain placed by surgery PMHx of CAD, DM, TAA.  5/22 Re-consult for progressive hypoxia throughout the night and ongoing confusion, ~70's on RA, placed on NRB then to BiPAP.  CXR- rotated film worsening R multifocal consolidation, small pleural effusions.  Net +21 L.  Hypertensive, afebrile x 48 hours>> Intubated  Subjective: Off norepi since  5/24 am Tolerating PEEP of 5 T Max 98.8 + 10 L since admission  VITAL SIGNS: BP 99/63   Pulse 61   Temp 98.4 F (36.9 C)   Resp (!) 22   Ht 5\' 10"  (1.778 m)   Wt 268 lb 1.3 oz (121.6 kg)   SpO2 99%   BMI 38.47 kg/m   INTAKE / OUTPUT: I/O last 3 completed shifts: In: 3506.4 [I.V.:2930.9; Other:5; ME/QA:834.1; IV Piggyback:155] Out: 2070 [Urine:1810; Drains:95; Stool:165]  PHYSICAL EXAMINATION: General:  Elderly male, intubated, sedated, on CPAP/PS,in NAD, lip speaking HEENT: NCAT,  ETT secure and  in place, neck thick,  no LAD , MM pink/moist Neuro: Sedated, awake and lipspeaking, follows some commands, MAE x 4 CV:   HR in the 60's, S1, S2,RRR No RMG PULM: Bilateral chest excursion noted, Coarse breath sounds throughout with a few rhonchit, diminished per bases due to large abdomen/ wound GI: Ileostomy with 400cc  last 24, JP drain in place averaging  50 cc drainage per 12 hours, abdominal binder in place,  hypoactive bowel sounds, dressing CDI, Stoma pink Extremities: Generalized Diffuse 2-3 +edema, nailbeds are pink with  Brisk refill Skin: No obvious rashes, lesions, or ulcerations, large abdominal wound, ileostomy  LABS:  BMET Recent Labs  Lab 06/04/17 0345 06/05/17 0326 06/06/17 0450  NA 145 142 149*  K 3.9 4.3 4.5  CL 116* 115* 120*  CO2 22 21* 22  BUN 52* 52* 52*  CREATININE 1.79* 1.95* 1.98*  GLUCOSE 178* 133* 142*    Electrolytes Recent Labs  Lab 06/01/17 0343 06/02/17 0500 06/03/17 0435 06/04/17 0345 06/05/17 0326 06/06/17 0450  CALCIUM 7.7* 7.5* 7.5* 7.5* 7.3* 7.7*  MG 2.1 2.3 2.4 2.5*  --   --   PHOS 3.1 5.3* 3.3  --   --   --     CBC Recent Labs  Lab 06/04/17 0345 06/05/17 0326 06/06/17 0450  WBC 8.0 7.8 5.9  HGB 7.4* 7.4* 7.7*  HCT 24.8* 25.1* 25.8*  PLT 186 183 169    Coag's No results for input(s): APTT, INR in the last 168 hours.  Sepsis Markers No results for input(s): LATICACIDVEN, PROCALCITON, O2SATVEN in the last 168 hours.  ABG Recent Labs  Lab 06/05/17 0327 06/06/17 0435 06/06/17 0735  PHART 7.339* 7.389 7.425  PCO2ART 37.1 29.8* 29.3*  PO2ART 86.0 100 73.0*    Liver Enzymes Recent Labs  Lab 06/02/17 0500 06/05/17 0326 06/06/17 0450  AST 33 41 38  ALT 38 31 30  ALKPHOS 126 155* 143*  BILITOT 1.2 1.3* 1.1  ALBUMIN 1.2* 1.1* <1.0*    Cardiac Enzymes No results for input(s): TROPONINI, PROBNP in the last 168 hours.  Glucose Recent Labs  Lab 06/05/17 1139 06/05/17 1638 06/05/17 2005 06/05/17 2330 06/06/17 0424 06/06/17 0804  GLUCAP 95 106* 127* 78 129* 136*    Imaging Dg Chest Port 1 View  Result Date: 06/06/2017 CLINICAL DATA:  Respiratory failure.  Follow-up exam. EXAM: PORTABLE CHEST 1 VIEW COMPARISON:  06/05/2017 and older exams. FINDINGS: There is persistent patchy consolidation in the right upper lobe and at the left lung base, similar to the most recent prior exam. Right upper lobe consolidation has  improved compared to exams from 06/01/2017 and 06/03/2017. No new lung abnormalities. Probable small effusions. No pneumothorax. Endotracheal tube, nasal/orogastric tube and right internal jugular central venous line are stable. IMPRESSION: 1. No significant change from the previous day's study. 2. Patchy right upper lobe airspace opacity consistent with residual pneumonia. Left lung base opacity may reflect additional pneumonia, atelectasis or a combination. 3. Support apparatus is stable and well positioned. Electronically Signed   By: Lajean Manes M.D.   On: 06/06/2017 07:52   STUDIES:  CT abd 5/09 >> discontinuity in the posterior wall just above the anastomosis with feculent appearing material and gas in the pelvis, measuring up to 5x 3 cm anterior and superior to the sigmoid colon.  Fluid collection right lower quadrant measuring up to 6.4 x 3.5 cm. Moderate pneumoperitoneum.   CULTURES: MRSA PCR 5/8 >> neg Blood culture 5/8 >> neg Abd ascess 5/16 >> pseudomonas > pan sensitive Blood culture 5/22>> Urine culture 5/22>> No growth Bronchial alveolar lavage 5/22>> Candida Glabrata>> suspect contaminent  ANTIBIOTICS: Zosyn 5/7 > 5/8 Zosyn 5/10>> Unasyn 5/8 > 5/10 Vancomycin 5/7 >5/20 Eraxis 5/8 > 5/18  LINES/TUBES: Lt IJ CVL  5/8 > 5/10 JP Drain 5/1> RUE PICC 5/10 >>5/23 Right IJ CVC 5/22>>  DISCUSSION: 72 yo male with sepsis from peritonitis after surgery for colon cancer and CT abd +  for anastomotic leak.>> Drain placed 5/1.  Pt with acute hypoxic respiratory failure on 5/22 requiring intubation.  Concern for Aspiration.  ASSESSMENT / PLAN:  Acute Hypoxic respiratory insufficiency 2/2 R lung consolidation HAP vs aspiration pneumonitis  Continue PRVC 8 cc/kg, wean PEEP from 10 to 5 5/24 CPAP/PS 5/27 8/5 CXR 5/27>> stable RUL pneumonia  Plan: CXR prn Titrate oxygen and PEEP for sats > 92% VAP prevention bundle Pulmonary hygiene Zosyn as below Push spontaneous breathing  trials as he is  Tolerating  lower PEEP of 5 May need trach if current weaning is unsuccessful  Sepsis with septic shock from peritonitis- resolved  Abd abscess w/ pseudomonas  Colon cancer s/p low anterior resection with ileostomy w/anastomosis leak  UA w/ +Leukocytes & many bacteria>> UC negative on 5/23 ( Leukocytosis resolving) Probable aspiration pneumonia Wound debridement / dressing change per surgery 5/25/am Plan: Continue Zosyn given concern for new pulmonary infection Trend Fever and WBC (WBC  Continues to Down trend 5/27) Follow culture data>>reculture as is clinically indicated Abdominal drain in place, surgery following  Acute renal failure from ATN. Continued increase in creatinine overnight to 1.98 on 5/27 Hypernatremia Plan:   Diuresis as he can tolerate, hematin Amick instability has precluded this recently Follow urine output, serum creatinine, electrolytes Avoid nephrotoxins Replete electrolytes prn Will add free water 100 cc every 8 hours  Anemia HGB 7.4 >> ( ? if hemo dilutional) Plan Follow CBC Transfusion threshold hemoglobin < 7 Monitor for any obvious bleeding  GI Ilesotomy/ JP/ Open abdominal wound Hypoalbuminemia>> less than 1 on 5/27 TNA stopped 5/24 TF started 5/24>> vomited large amount 5/25 am Bicarb down trending on CMET, and ABG ? High volume loss from illeostomy Plan: Continue TF at 30 cc /hr>> tolerating well CMET prn to monitor albumin Monitor ileostomy for high output>> Creatinine increase>> decrease in bicarb Continue LR at 50 for replacement of wound loss ABG  prn Consider immodium/ lomotil per Surgery if needed     DM type II. Hypoglycemia in setting of sepsis- resolved now hyperglycemic  ICU hyperglycemia protocol CBG's Sliding scale insulin  Acute metabolic encephalopathy 2nd to sepsis- Slowly improving RASS goal 0 to -1 Continue Precedex drip>> wean as able Minimize/ Avoid  other sedating medications if able Daily  wake up assessment  Hypertension>>resolved, now hypotensive likely in setting of vent/sedation Bradycardia>>likely in setting or precedex gtt Norepinephrine off since 5/24 Metoprolol on hold Monitor QTc EKG prn Minimize sedation >> likely contributing to his hypotension/need for pressors   VTE prophylaxis -SCD's only (given hx of retroperitoneal bleed) SUP-Pepcid Nutrition - Transitioned to TF 5/24 Goals of care- Full code  Prognosis continued to be  guarded  Family- No family present at bedside 5/27 Updated daughter at length see note dated 5/26   Magdalen Spatz, AGACNP-BC 06/06/2017, 10:53 AM [facility] [phone]

## 2017-06-06 NOTE — Progress Notes (Signed)
PULMONARY / CRITICAL CARE MEDICINE   Name: Jesse Le MRN: [id number] DOB: 1945-08-11    ADMISSION DATE:  05/06/2017 CONSULTATION DATE:  05/18/2017  REFERRING MD:  Dr. Terrence Dupont  CHIEF COMPLAINT:  Hypotension  HISTORY OF PRESENT ILLNESS:   72 yo male presented with abdominal pain, weight loss and intermittent hematochezia.  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  Had low anterior resection with primary anastomosis with diverting loop ileostomy.  Developed hypotension, hypoglycemia 5/07.  Developed hypotension and altered mental status 5/08 and transferred to ICU. Pt has an anastomotic leak, drain placed by surgery PMHx of CAD, DM, TAA.  5/22 Re-consult for progressive hypoxia throughout the night and ongoing confusion, ~70's on RA, placed on NRB then to BiPAP.  CXR- rotated film worsening R multifocal consolidation, small pleural effusions.  Net +21 L.  Hypertensive, afebrile x 48 hours>> Intubated  Subjective: Off norepi since  5/24 am Tolerating PEEP of 5 T Max 98.8 + 10 L since admission  VITAL SIGNS: BP 99/63   Pulse 61   Temp 98.4 F (36.9 C)   Resp (!) 22   Ht 5\' 10"  (1.778 m)   Wt 268 lb 1.3 oz (121.6 kg)   SpO2 99%   BMI 38.47 kg/m   INTAKE / OUTPUT: I/O last 3 completed shifts: In: 3506.4 [I.V.:2930.9; Other:5; XA/JO:878.6; IV Piggyback:155] Out: 2070 [Urine:1810; Drains:95; Stool:165]  PHYSICAL EXAMINATION: General:  Elderly male, intubated, sedated, on CPAP/PS,in NAD, lip speaking HEENT: NCAT,  ETT secure and  in place, neck thick,  no LAD , MM pink/moist Neuro: Sedated, awake and lipspeaking, follows some commands, MAE x 4 CV:   HR in the 60's, S1, S2,RRR No RMG PULM: Bilateral chest excursion noted, Coarse breath sounds throughout with a few rhonchit, diminished per bases due to large abdomen/ wound GI: Ileostomy with 400cc  last 24, JP drain in place averaging  50 cc drainage per 12 hours, abdominal binder in place,  hypoactive bowel sounds, dressing CDI, Stoma pink Extremities: Generalized Diffuse 2-3 +edema, nailbeds are pink with  Brisk refill Skin: No obvious rashes, lesions, or ulcerations, large abdominal wound, ileostomy  LABS:  BMET Recent Labs  Lab 06/04/17 0345 06/05/17 0326 06/06/17 0450  NA 145 142 149*  K 3.9 4.3 4.5  CL 116* 115* 120*  CO2 22 21* 22  BUN 52* 52* 52*  CREATININE 1.79* 1.95* 1.98*  GLUCOSE 178* 133* 142*    Electrolytes Recent Labs  Lab 06/01/17 0343 06/02/17 0500 06/03/17 0435 06/04/17 0345 06/05/17 0326 06/06/17 0450  CALCIUM 7.7* 7.5* 7.5* 7.5* 7.3* 7.7*  MG 2.1 2.3 2.4 2.5*  --   --   PHOS 3.1 5.3* 3.3  --   --   --     CBC Recent Labs  Lab 06/04/17 0345 06/05/17 0326 06/06/17 0450  WBC 8.0 7.8 5.9  HGB 7.4* 7.4* 7.7*  HCT 24.8* 25.1* 25.8*  PLT 186 183 169    Coag's No results for input(s): APTT, INR in the last 168 hours.  Sepsis Markers No results for input(s): LATICACIDVEN, PROCALCITON, O2SATVEN in the last 168 hours.  ABG Recent Labs  Lab 06/05/17 0327 06/06/17 0435 06/06/17 0735  PHART 7.339* 7.389 7.425  PCO2ART 37.1 29.8* 29.3*  PO2ART 86.0 100 73.0*    Liver Enzymes Recent Labs  Lab 06/02/17 0500 06/05/17 0326 06/06/17 0450  AST 33 41 38  ALT 38 31 30  ALKPHOS 126 155* 143*  BILITOT 1.2 1.3* 1.1  ALBUMIN 1.2* 1.1* <1.0*    Cardiac Enzymes No results for input(s): TROPONINI, PROBNP in the last 168 hours.  Glucose Recent Labs  Lab 06/05/17 1139 06/05/17 1638 06/05/17 2005 06/05/17 2330 06/06/17 0424 06/06/17 0804  GLUCAP 95 106* 127* 78 129* 136*    Imaging Dg Chest Port 1 View  Result Date: 06/06/2017 CLINICAL DATA:  Respiratory failure.  Follow-up exam. EXAM: PORTABLE CHEST 1 VIEW COMPARISON:  06/05/2017 and older exams. FINDINGS: There is persistent patchy consolidation in the right upper lobe and at the left lung base, similar to the most recent prior exam. Right upper lobe consolidation has  improved compared to exams from 06/01/2017 and 06/03/2017. No new lung abnormalities. Probable small effusions. No pneumothorax. Endotracheal tube, nasal/orogastric tube and right internal jugular central venous line are stable. IMPRESSION: 1. No significant change from the previous day's study. 2. Patchy right upper lobe airspace opacity consistent with residual pneumonia. Left lung base opacity may reflect additional pneumonia, atelectasis or a combination. 3. Support apparatus is stable and well positioned. Electronically Signed   By: Lajean Manes M.D.   On: 06/06/2017 07:52   STUDIES:  CT abd 5/09 >> discontinuity in the posterior wall just above the anastomosis with feculent appearing material and gas in the pelvis, measuring up to 5x 3 cm anterior and superior to the sigmoid colon.  Fluid collection right lower quadrant measuring up to 6.4 x 3.5 cm. Moderate pneumoperitoneum.   CULTURES: MRSA PCR 5/8 >> neg Blood culture 5/8 >> neg Abd ascess 5/16 >> pseudomonas > pan sensitive Blood culture 5/22>> Urine culture 5/22>> No growth Bronchial alveolar lavage 5/22>> Candida Glabrata>> suspect contaminent  ANTIBIOTICS: Zosyn 5/7 > 5/8 Zosyn 5/10>> Unasyn 5/8 > 5/10 Vancomycin 5/7 >5/20 Eraxis 5/8 > 5/18  LINES/TUBES: Lt IJ CVL  5/8 > 5/10 JP Drain 5/1> RUE PICC 5/10 >>5/23 Right IJ CVC 5/22>>  DISCUSSION: 72 yo male with sepsis from peritonitis after surgery for colon cancer and CT abd +  for anastomotic leak.>> Drain placed 5/1.  Pt with acute hypoxic respiratory failure on 5/22 requiring intubation.  Concern for Aspiration.  ASSESSMENT / PLAN:  Acute Hypoxic respiratory insufficiency 2/2 R lung consolidation HAP vs aspiration pneumonitis  Continue PRVC 8 cc/kg, wean PEEP from 10 to 5 5/24 CPAP/PS 5/27 8/5 CXR 5/27>> stable RUL pneumonia  Plan: CXR prn Titrate oxygen and PEEP for sats > 92% VAP prevention bundle Pulmonary hygiene Zosyn as below Push spontaneous breathing  trials as he is  Tolerating  lower PEEP of 5 May need trach if current weaning is unsuccessful  Sepsis with septic shock from peritonitis- resolved  Abd abscess w/ pseudomonas  Colon cancer s/p low anterior resection with ileostomy w/anastomosis leak  UA w/ +Leukocytes & many bacteria>> UC negative on 5/23 ( Leukocytosis resolving) Probable aspiration pneumonia Wound debridement / dressing change per surgery 5/25/am Plan: Continue Zosyn given concern for new pulmonary infection Trend Fever and WBC (WBC  Continues to Down trend 5/27) Follow culture data>>reculture as is clinically indicated Abdominal drain in place, surgery following  Acute renal failure from ATN. Continued increase in creatinine overnight to 1.98 on 5/27 Hypernatremia Plan:   Diuresis as he can tolerate, hematin Amick instability has precluded this recently Follow urine output, serum creatinine, electrolytes Avoid nephrotoxins Replete electrolytes prn Will add free water 100 cc every 8 hours  Anemia HGB 7.4 >> ( ? if hemo dilutional) Plan Follow CBC Transfusion threshold hemoglobin < 7 Monitor for any obvious bleeding  GI Ilesotomy/ JP/ Open abdominal wound Hypoalbuminemia>> less than 1 on 5/27 TNA stopped 5/24 TF started 5/24>> vomited large amount 5/25 am Bicarb down trending on CMET, and ABG ? High volume loss from illeostomy Plan: Continue TF at 30 cc /hr>> tolerating well CMET prn to monitor albumin Monitor ileostomy for high output>> Creatinine increase>> decrease in bicarb Continue LR at 50 for replacement of wound loss ABG  prn Consider immodium/ lomotil per Surgery if needed     DM type II. Hypoglycemia in setting of sepsis- resolved now hyperglycemic  ICU hyperglycemia protocol CBG's Sliding scale insulin  Acute metabolic encephalopathy 2nd to sepsis- Slowly improving RASS goal 0 to -1 Continue Precedex drip>> wean as able Minimize/ Avoid  other sedating medications if able Daily  wake up assessment  Hypertension>>resolved, now hypotensive likely in setting of vent/sedation Bradycardia>>likely in setting or precedex gtt Norepinephrine off since 5/24 Metoprolol on hold Monitor QTc EKG prn Minimize sedation >> likely contributing to his hypotension/need for pressors   VTE prophylaxis -SCD's only (given hx of retroperitoneal bleed) SUP-Pepcid Nutrition - Transitioned to TF 5/24 Goals of care- Full code  Prognosis continued to be  guarded  Family- No family present at bedside 5/27 Updated daughter at length see note dated 5/26   Magdalen Spatz, AGACNP-BC 06/06/2017, 10:22 AM [facility] [phone]

## 2017-06-06 NOTE — Progress Notes (Signed)
Progress Note: General Surgery Service   Assessment/Plan: Patient Active Problem List   Diagnosis Date Noted  . Acute respiratory failure with hypoxemia ([place])   . Atelectasis   . Septic shock ([place])   . Hypoglycemia after GI (gastrointestinal) surgery   . Colostomy present ([place])   . Ventral hernia s/p primary repair 05/11/2017 05/14/2017  . Physical deconditioning 05/14/2017  . CAD (coronary atherosclerotic disease) 05/14/2017  . Diabetes mellitus without complication ([place])   . Acute blood loss anemia   . 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 Iron deficiency anemia due to chronic blood loss   . Abnormal CT scan, sigmoid colon   . Generalized abdominal pain 05/06/2017  . 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 Aftercare following surgery of the circulatory system, [place] 11/22/2011   s/p Procedure(s): Colon Resection Sigmoid Low Anterior Resection with Staple Colon Relast HERNIA REPAIR UMBILICAL ADULT Creation Vertical Loop ILEOSTOMY 05/11/2017 -continue shift dressing changes -can slowly increase tube feeds    LOS: 31 days  Chief Complaint/Subjective: No events overnight  Objective: Vital signs in last 24 hours: Temp:  [97.7 F (36.5 C)-98.8 F (37.1 C)] 98.8 F (37.1 C) (05/27 0600) Pulse Rate:  [46-60] 49 (05/27 0500) Resp:  [16-24] 16 (05/27 0323) BP: (87-109)/(42-72) 97/47 (05/27 0600) SpO2:  [94 %-100 %] 97 % (05/27 0500) Arterial Line BP: (95-126)/(29-59) 108/37 (05/27 0600) FiO2 (%):  [40 %] 40 % (05/27 0400) Weight:  [121.6 kg (268 lb 1.3 oz)] 121.6 kg (268 lb 1.3 oz) (05/27 0500) Last BM Date: 06/05/17  Intake/Output from previous day: 05/26 0701 - 05/27 0700 In: 2864.4 [I.V.:2426.9; NG/GT:382.5; IV Piggyback:50] Out: 1480 [Urine:1335; Drains:80; Stool:65] Intake/Output this shift: No intake/output data recorded.  Lungs: assisted, coarse  Cardiovascular: bradycardic  Abd: soft, open wound with right sided subcutaneous  fibrinous debride, 2 areas at base of wound which are soft, otherwise good granulation tissue  Extremities: trace edema  Neuro: opens eyes and moves extremities to harsh stimulus  Lab Results: CBC  Recent Labs    06/05/17 0326 06/06/17 0450  WBC 7.8 5.9  HGB 7.4* 7.7*  HCT 25.1* 25.8*  PLT 183 169   BMET Recent Labs    06/05/17 0326 06/06/17 0450  NA 142 149*  K 4.3 4.5  CL 115* 120*  CO2 21* 22  GLUCOSE 133* 142*  BUN 52* 52*  CREATININE 1.95* 1.98*  CALCIUM 7.3* 7.7*   PT/INR No results for input(s): LABPROT, INR in the last 72 hours. ABG Recent Labs    06/06/17 0435 06/06/17 0735  PHART 7.389 7.425  HCO3 17.6* 19.2*    Studies/Results:  Anti-infectives: Anti-infectives (From admission, onward)   Start     Dose/Rate Route Frequency Ordered Stop   05/28/17 1200  vancomycin (VANCOCIN) 1,250 mg in sodium chloride 0.9 % 250 mL IVPB  Status:  Discontinued     1,250 mg 166.7 mL/hr over 90 Minutes Intravenous Every 24 hours 05/27/17 1324 05/30/17 1615   05/27/17 1100  vancomycin (VANCOCIN) 1,250 mg in sodium chloride 0.9 % 250 mL IVPB  Status:  Discontinued     1,250 mg 166.7 mL/hr over 90 Minutes Intravenous Every 12 hours 05/27/17 1011 05/27/17 1324   05/20/17 2230  vancomycin (VANCOCIN) 1,250 mg in sodium chloride 0.9 % 250 mL IVPB  Status:  Discontinued     1,250 mg 166.7 mL/hr over 90 Minutes Intravenous Every 24 hours 05/20/17 2151 05/27/17 1011   05/20/17 1400  piperacillin-tazobactam (ZOSYN) IVPB 3.375  g     3.375 g 12.5 mL/hr over 240 Minutes Intravenous Every 8 hours 05/20/17 1044     05/20/17 0500  anidulafungin (ERAXIS) 100 mg in sodium chloride 0.9 % 100 mL IVPB  Status:  Discontinued     100 mg 78 mL/hr over 100 Minutes Intravenous Every 24 hours 05/19/17 0116 05/29/17 1214   05/19/17 2200  vancomycin (VANCOCIN) 1,500 mg in sodium chloride 0.9 % 500 mL IVPB  Status:  Discontinued     1,500 mg 250 mL/hr over 120 Minutes Intravenous Every 24  hours 05/18/17 2126 05/20/17 2150   05/19/17 0130  anidulafungin (ERAXIS) 200 mg in sodium chloride 0.9 % 200 mL IVPB     200 mg 78 mL/hr over 200 Minutes Intravenous  Once 05/19/17 0116 05/19/17 0520   05/19/17 0130  Ampicillin-Sulbactam (UNASYN) 3 g in sodium chloride 0.9 % 100 mL IVPB  Status:  Discontinued     3 g 200 mL/hr over 30 Minutes Intravenous Every 8 hours 05/19/17 0118 05/20/17 1044   05/18/17 2200  vancomycin (VANCOCIN) 1,750 mg in sodium chloride 0.9 % 500 mL IVPB     1,750 mg 250 mL/hr over 120 Minutes Intravenous  Once 05/18/17 2126 05/19/17 0400   05/17/17 0930  piperacillin-tazobactam (ZOSYN) IVPB 3.375 g  Status:  Discontinued     3.375 g 12.5 mL/hr over 240 Minutes Intravenous Every 8 hours 05/17/17 0836 05/19/17 0118   05/11/17 2200  cefoTEtan (CEFOTAN) 2 g in sodium chloride 0.9 % 100 mL IVPB     2 g 200 mL/hr over 30 Minutes Intravenous Every 12 hours 05/11/17 1839 05/11/17 2034   05/11/17 0600  cefoTEtan in Dextrose 5% (CEFOTAN) IVPB 2 g     2 g Intravenous On call to O.R. 05/10/17 1142 05/11/17 1416      Medications: Scheduled Meds: . chlorhexidine gluconate (MEDLINE KIT)  15 mL Mouth Rinse BID  . Chlorhexidine Gluconate Cloth  6 each Topical Daily  . famotidine  40 mg Per Tube Daily  . feeding supplement (PRO-STAT SUGAR FREE 64)  30 mL Per Tube BID  . feeding supplement (VITAL 1.5 CAL)  1,000 mL Per Tube Q24H  . insulin aspart  0-15 Units Subcutaneous Q4H  . lip balm  1 application Topical BID  . mouth rinse  15 mL Mouth Rinse 10 times per day  . sodium chloride flush  5 mL Intracatheter Q8H  . vitamin C  500 mg Per Tube Daily   Continuous Infusions: . dexmedetomidine (PRECEDEX) IV infusion 0.8 mcg/kg/hr (06/06/17 0032)  . lactated ringers 100 mL/hr at 06/05/17 2159  . norepinephrine (LEVOPHED) Adult infusion Stopped (06/03/17 1324)  . piperacillin-tazobactam (ZOSYN)  IV 3.375 g (06/06/17 0515)   PRN Meds:.acetaminophen (TYLENOL) oral liquid 160  mg/5 mL, dextrose, fentaNYL (SUBLIMAZE) injection, hydrALAZINE, hydrocortisone, hydrocortisone cream, midazolam, [DISCONTINUED] ondansetron **OR** ondansetron (ZOFRAN) IV, prochlorperazine, sodium chloride flush  Mickeal Skinner, MD Pg# [phone] [facility]

## 2017-06-06 NOTE — Progress Notes (Signed)
CSW continuing to follow for disposition planning pending medical readiness.  Estanislado Emms, [place]

## 2017-06-06 NOTE — Progress Notes (Signed)
Subjective:  Patient remains intubated sedated. Generalised edematous albumin remains very low. Bradycardic on monitor had  occasional nonsustained few beats of SVT  Objective:  Vital Signs in the last 24 hours: Temp:  [97.7 F (36.5 C)-98.8 F (37.1 C)] 98.4 F (36.9 C) (05/27 0900) Pulse Rate:  [47-61] 61 (05/27 0900) Resp:  [16-24] 22 (05/27 0802) BP: (85-109)/(42-72) 99/63 (05/27 0900) SpO2:  [84 %-100 %] 99 % (05/27 0900) Arterial Line BP: (87-131)/(30-59) 131/46 (05/27 0900) FiO2 (%):  [40 %] 40 % (05/27 0900) Weight:  [121.6 kg (268 lb 1.3 oz)] 121.6 kg (268 lb 1.3 oz) (05/27 0500)  Intake/Output from previous day: 05/26 0701 - 05/27 0700 In: 3018.8 [I.V.:2551.3; NG/GT:412.5; IV Piggyback:50] Out: 4270 [WCBJS:2831; Drains:80; Stool:65] Intake/Output from this shift: Total I/O In: 338.8 [I.V.:248.8; NG/GT:90] Out: 435 [Urine:85; Drains:50; Stool:300]  Physical Exam: Neck: no adenopathy, no carotid bruit, no JVD and supple, symmetrical, trachea midline Lungs: Bilateral rhonchi Heart: Bradycardic S1 and S2 soft Abdomen: Distended abdominal binder and surgical dressing noted Extremities: No clubbing cyanosis generalized edema noted  Lab Results: Recent Labs    06/05/17 0326 06/06/17 0450  WBC 7.8 5.9  HGB 7.4* 7.7*  PLT 183 169   Recent Labs    06/05/17 0326 06/06/17 0450  NA 142 149*  K 4.3 4.5  CL 115* 120*  CO2 21* 22  GLUCOSE 133* 142*  BUN 52* 52*  CREATININE 1.95* 1.98*   No results for input(s): TROPONINI in the last 72 hours.  Invalid input(s): CK, MB Hepatic Function Panel Recent Labs    06/06/17 0450  PROT 5.5*  ALBUMIN <1.0*  AST 38  ALT 30  ALKPHOS 143*  BILITOT 1.1   No results for input(s): CHOL in the last 72 hours. No results for input(s): PROTIME in the last 72 hours.  Imaging: Imaging results have been reviewed and Dg Chest Port 1 View  Result Date: 06/06/2017 CLINICAL DATA:  Respiratory failure.  Follow-up exam. EXAM:  PORTABLE CHEST 1 VIEW COMPARISON:  06/05/2017 and older exams. FINDINGS: There is persistent patchy consolidation in the right upper lobe and at the left lung base, similar to the most recent prior exam. Right upper lobe consolidation has improved compared to exams from 06/01/2017 and 06/03/2017. No new lung abnormalities. Probable small effusions. No pneumothorax. Endotracheal tube, nasal/orogastric tube and right internal jugular central venous line are stable. IMPRESSION: 1. No significant change from the previous day's study. 2. Patchy right upper lobe airspace opacity consistent with residual pneumonia. Left lung base opacity may reflect additional pneumonia, atelectasis or a combination. 3. Support apparatus is stable and well positioned. Electronically Signed   By: Lajean Manes M.D.   On: 06/06/2017 07:52   Dg Chest Port 1 View  Result Date: 06/05/2017 CLINICAL DATA:  Respiratory failure. EXAM: PORTABLE CHEST 1 VIEW COMPARISON:  Radiograph of Jun 03, 2017. FINDINGS: Stable cardiomegaly. Thoracic aortic stent is again noted. Endotracheal and feeding tubes are unchanged in position. Right internal jugular catheter is again noted. No pneumothorax is noted. Stable right upper lobe airspace opacity is noted consistent with pneumonia. Mild left basilar atelectasis is noted with associated pleural effusion. Bony thorax is unremarkable. IMPRESSION: Stable support apparatus. Stable right upper lobe airspace opacity is noted consistent with pneumonia. Mild left basilar atelectasis is noted with probable small pleural effusion. Electronically Signed   By: Marijo Conception, M.D.   On: 06/05/2017 08:21    Cardiac Studies:  Assessment/Plan:  Rectosigmoid adenocarcinoma status post resection with  primary colorectal anastomosis and dilating loop ileostomy and umbilical hernia repair Acute respiratory failure secondary to aspiration pneumonia status post bronchoscopy and suctioning of right middle and lower lobe  with partial clearing Spontaneous retroperitoneal hematoma Abdominalsurgical wound infectionwith bowel exposed. Metabolic encephalopathy Status postSeptichypotensive shock secondary Intra-abdominal sepsis/possible abscess/anastomosis leak Acute on chronic renal injury. 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

## 2017-06-07 ENCOUNTER — Inpatient Hospital Stay (HOSPITAL_COMMUNITY): Payer: Medicare Other

## 2017-06-07 ENCOUNTER — Inpatient Hospital Stay: Payer: Medicare Other | Admitting: Hematology

## 2017-06-07 ENCOUNTER — Inpatient Hospital Stay: Payer: Medicare Other

## 2017-06-07 DIAGNOSIS — J810 Acute pulmonary edema: Secondary | ICD-10-CM

## 2017-06-07 DIAGNOSIS — R188 Other ascites: Secondary | ICD-10-CM

## 2017-06-07 DIAGNOSIS — K651 Peritoneal abscess: Secondary | ICD-10-CM

## 2017-06-07 DIAGNOSIS — A498 Other bacterial infections of unspecified site: Secondary | ICD-10-CM

## 2017-06-07 LAB — CBC
HEMATOCRIT: 26.3 % — AB (ref 39.0–52.0)
HEMOGLOBIN: 7.9 g/dL — AB (ref 13.0–17.0)
MCH: 26.3 pg (ref 26.0–34.0)
MCHC: 30 g/dL (ref 30.0–36.0)
MCV: 87.7 fL (ref 78.0–100.0)
Platelets: 142 10*3/uL — ABNORMAL LOW (ref 150–400)
RBC: 3 MIL/uL — ABNORMAL LOW (ref 4.22–5.81)
RDW: 26.1 % — AB (ref 11.5–15.5)
WBC: 4.8 10*3/uL (ref 4.0–10.5)

## 2017-06-07 LAB — GLUCOSE, CAPILLARY
GLUCOSE-CAPILLARY: 143 mg/dL — AB (ref 65–99)
GLUCOSE-CAPILLARY: 139 mg/dL — AB (ref 65–99)
GLUCOSE-CAPILLARY: 147 mg/dL — AB (ref 65–99)
Glucose-Capillary: 165 mg/dL — ABNORMAL HIGH (ref 65–99)
Glucose-Capillary: 186 mg/dL — ABNORMAL HIGH (ref 65–99)
Glucose-Capillary: 161 mg/dL — ABNORMAL HIGH (ref 65–99)

## 2017-06-07 LAB — BASIC METABOLIC PANEL
ANION GAP: 7 (ref 5–15)
BUN: 45 mg/dL — ABNORMAL HIGH (ref 6–20)
CALCIUM: 7.7 mg/dL — AB (ref 8.9–10.3)
CO2: 21 mmol/L — AB (ref 22–32)
Chloride: 121 mmol/L — ABNORMAL HIGH (ref 101–111)
Creatinine, Ser: 1.79 mg/dL — ABNORMAL HIGH (ref 0.61–1.24)
GFR calc non Af Amer: 36 mL/min — ABNORMAL LOW (ref >60)
GFR, EST AFRICAN AMERICAN: 42 mL/min — AB (ref >60)
Glucose, Bld: 162 mg/dL — ABNORMAL HIGH (ref 65–99)
Potassium: 4.2 mmol/L (ref 3.5–5.1)
Sodium: 149 mmol/L — ABNORMAL HIGH (ref 135–145)

## 2017-06-07 LAB — PREALBUMIN: PREALBUMIN: 6.7 mg/dL — AB (ref 18–38)

## 2017-06-07 MED ORDER — FUROSEMIDE 10 MG/ML IJ SOLN
40.0000 mg | Freq: Four times a day (QID) | INTRAMUSCULAR | Status: AC
  Administered 2017-06-07 (×3): 40 mg via INTRAVENOUS
  Filled 2017-06-07 (×3): qty 4

## 2017-06-07 MED ORDER — CHLORHEXIDINE GLUCONATE 0.12 % MT SOLN
15.0000 mL | Freq: Two times a day (BID) | OROMUCOSAL | Status: DC
  Administered 2017-06-07 – 2017-06-08 (×3): 15 mL via OROMUCOSAL
  Filled 2017-06-07 (×2): qty 15

## 2017-06-07 MED ORDER — ORAL CARE MOUTH RINSE
15.0000 mL | Freq: Two times a day (BID) | OROMUCOSAL | Status: DC
  Administered 2017-06-08 (×2): 15 mL via OROMUCOSAL

## 2017-06-07 MED ORDER — VITAL 1.5 CAL PO LIQD
1000.0000 mL | ORAL | Status: DC
  Administered 2017-06-07 – 2017-06-09 (×3): 1000 mL
  Filled 2017-06-07 (×4): qty 1000

## 2017-06-07 MED ORDER — FREE WATER
250.0000 mL | Freq: Four times a day (QID) | Status: DC
  Administered 2017-06-07 – 2017-06-14 (×18): 250 mL

## 2017-06-07 NOTE — Progress Notes (Signed)
PULMONARY / CRITICAL CARE MEDICINE   Name: Jesse Le MRN: [id number] DOB: Dec 05, 1945    ADMISSION DATE:  05/06/2017 CONSULTATION DATE:  05/18/2017  REFERRING MD:  Dr. Terrence Dupont  CHIEF COMPLAINT:  Hypotension  HISTORY OF PRESENT ILLNESS:   72 yo male presented with abdominal pain, weight loss and intermittent hematochezia.  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  Had low anterior resection with primary anastomosis with diverting loop ileostomy.  Developed hypotension, hypoglycemia 5/07.  Developed hypotension and altered mental status 5/08 and transferred to ICU. Pt has an anastomotic leak, drain placed by surgery PMHx of CAD, DM, TAA.  5/22 Re-consult for progressive hypoxia throughout the night and ongoing confusion, ~70's on RA, placed on NRB then to BiPAP.  CXR- rotated film worsening R multifocal consolidation, small pleural effusions.  Net +21 L.  Hypertensive, afebrile x 48 hours>> Intubated  Subjective: No events overnight, off pressors, no new complaints  VITAL SIGNS: BP (!) 158/55   Pulse 62   Temp 98.4 F (36.9 C)   Resp (!) 23   Ht 5\' 10"  (1.778 m)   Wt 268 lb 4.8 oz (121.7 kg)   SpO2 100%   BMI 38.50 kg/m   INTAKE / OUTPUT: I/O last 3 completed shifts: In: 5073.4 [I.V.:3558.4; Other:5; NG/GT:1260; IV Piggyback:250] Out: 1829 [Urine:2235; Drains:350; Stool:790]  PHYSICAL EXAMINATION: General:  Elderly male, resting in exam bed, NAD, mouthing words, intubated HEENT: [place]/AT, PERRL, EOM-I and MMM Neuro: Arousable, on precedex, no agitation, moving all ext to command CV: RRR, Nl S1/S2 and -M/R/G PULM: Coarse BS diffusely on 10/5. GI: Ileostomy with 400cc  last 24, JP drain in place averaging  50 cc drainage per 12 hours, abdominal binder in place, hypoactive bowel sounds, dressing CDI, Stoma pink, non-tender Extremities: 2 to 3+ edema Skin: Wound noted  LABS:  BMET Recent Labs  Lab 06/05/17 0326 06/06/17 0450 06/07/17 0406   NA 142 149* 149*  K 4.3 4.5 4.2  CL 115* 120* 121*  CO2 21* 22 21*  BUN 52* 52* 45*  CREATININE 1.95* 1.98* 1.79*  GLUCOSE 133* 142* 162*   Electrolytes Recent Labs  Lab 06/01/17 0343 06/02/17 0500 06/03/17 0435 06/04/17 0345 06/05/17 0326 06/06/17 0450 06/07/17 0406  CALCIUM 7.7* 7.5* 7.5* 7.5* 7.3* 7.7* 7.7*  MG 2.1 2.3 2.4 2.5*  --   --   --   PHOS 3.1 5.3* 3.3  --   --   --   --    CBC Recent Labs  Lab 06/05/17 0326 06/06/17 0450 06/07/17 0406  WBC 7.8 5.9 4.8  HGB 7.4* 7.7* 7.9*  HCT 25.1* 25.8* 26.3*  PLT 183 169 142*   Coag's No results for input(s): APTT, INR in the last 168 hours.  Sepsis Markers No results for input(s): LATICACIDVEN, PROCALCITON, O2SATVEN in the last 168 hours.  ABG Recent Labs  Lab 06/05/17 0327 06/06/17 0435 06/06/17 0735  PHART 7.339* 7.389 7.425  PCO2ART 37.1 29.8* 29.3*  PO2ART 86.0 100 73.0*   Liver Enzymes Recent Labs  Lab 06/02/17 0500 06/05/17 0326 06/06/17 0450  AST 33 41 38  ALT 38 31 30  ALKPHOS 126 155* 143*  BILITOT 1.2 1.3* 1.1  ALBUMIN 1.2* 1.1* <1.0*   Cardiac Enzymes No results for input(s): TROPONINI, PROBNP in the last 168 hours.  Glucose Recent Labs  Lab 06/06/17 1248 06/06/17 1605 06/06/17 2003 06/06/17 2345 06/07/17 0407 06/07/17 0733  GLUCAP 126* 140* 84 163* 143* 139*   Imaging  Dg Chest Port 1 View  Result Date: 06/07/2017 CLINICAL DATA:  Intubation.  Respiratory failure. EXAM: PORTABLE CHEST 1 VIEW COMPARISON:  06/06/2017. FINDINGS: Endotracheal tube, feeding tube, right IJ line stable position. Heart size normal. Aortic stent graft in stable position. Persistent right upper lobe and left lower lobe infiltrates. Persistent bibasilar atelectasis. Tiny left pleural effusion cannot be excluded. No pneumothorax. IMPRESSION: 1. Lines and tubes in stable position. Aortic stent graft in stable position. 2. Persistent right upper lobe and left lower lobe infiltrate. Persistent bibasilar  atelectasis. Tiny left pleural effusion cannot be excluded. Chest is unchanged from prior exam. Electronically Signed   By: Marcello Moores  Register   On: 06/07/2017 07:44   STUDIES:  CT abd 5/09 >> discontinuity in the posterior wall just above the anastomosis with feculent appearing material and gas in the pelvis, measuring up to 5x 3 cm anterior and superior to the sigmoid colon.  Fluid collection right lower quadrant measuring up to 6.4 x 3.5 cm. Moderate pneumoperitoneum.  CULTURES: MRSA PCR 5/8 >> neg Blood culture 5/8 >> neg Abd ascess 5/16 >> pseudomonas > pan sensitive Blood culture 5/22>> Urine culture 5/22>> No growth Bronchial alveolar lavage 5/22>> Candida Glabrata>> suspect contaminent  ANTIBIOTICS: Zosyn 5/7 > 5/8 Zosyn 5/10>> Unasyn 5/8 > 5/10 Vancomycin 5/7 >5/20 Eraxis 5/8 > 5/18  LINES/TUBES: Lt IJ CVL  5/8 > 5/10 JP Drain 5/1> RUE PICC 5/10 >>5/23 Right IJ CVC 5/22>> ETT 5/22>>>  DISCUSSION: 72 yo male with sepsis from peritonitis after surgery for colon cancer and CT abd +  for anastomotic leak.>> Drain placed 5/1.  Pt with acute hypoxic respiratory failure on 5/22 requiring intubation.  Concern for Aspiration.  ASSESSMENT / PLAN:  Acute Hypoxic respiratory insufficiency 2/2 R lung consolidation HAP vs aspiration pneumonitis  Continue PRVC 8 cc/kg, wean PEEP from 10 to 5 5/24 CPAP/PS 5/27 8/5 CXR 5/27>> stable RUL pneumonia  Plan: CXR and ABG in AM Titrate oxygen and PEEP for sats > 92% VAP prevention bundle Pulmonary hygiene Zosyn as below PS at tolerated today If not weanable by the end of the week will consider trach Active diureses today  Sepsis with septic shock from peritonitis- resolved  Abd abscess w/ pseudomonas  Colon cancer s/p low anterior resection with ileostomy w/anastomosis leak  UA w/ +Leukocytes & many bacteria>> UC negative on 5/23 ( Leukocytosis resolving) Probable aspiration pneumonia Wound debridement / dressing change per surgery  5/25/am Plan: Continue zosyn Trend Fever and WBC (WBC  Continues to Down trend 5/27) Follow culture data>>reculture as is clinically indicated Abdominal drain in place, surgery following, appreciate input  Acute renal failure from ATN. Continued increase in creatinine overnight to 1.98 on 5/27 Hypernatremia Plan:   Diuresis as he can tolerate, hematin Amick instability has precluded this recently Avoid nephrotoxins Replete electrolytes as beeded Increase free water to 250 q6 Lasix 40 mg IV q6 x3 doses D/C LR  Anemia HGB 7.4 >> ( ? if hemo dilutional) Plan Follow CBC Transfusion threshold hemoglobin < 7 Monitor for any obvious bleeding  GI Ilesotomy/ JP/ Open abdominal wound Hypoalbuminemia>> less than 1 on 5/27 TNA stopped 5/24 TF started 5/24>> vomited large amount 5/25 am Bicarb down trending on CMET, and ABG ? High volume loss from illeostomy Plan: Continue TF at 30 cc /hr>> tolerating well, advance to goal CMET prn to monitor albumin Monitor ileostomy for high output>> Creatinine increase>> decrease in bicarb ABG  prn Consider immodium/ lomotil per Surgery if needed    DM  type II. Hypoglycemia in setting of sepsis- resolved now hyperglycemic  ICU hyperglycemia protocol CBG's Sliding scale insulin  Acute metabolic encephalopathy 2nd to sepsis- Slowly improving RASS goal 0 to -1 Continue Precedex drip>> wean as able Avoid benzos PRN fentanyl for pain Daily wake up assessment  Hypertension>>resolved, now hypotensive likely in setting of vent/sedation Bradycardia>>likely in setting or precedex gtt Norepinephrine off since 5/24 Metoprolol on hold Monitor QTc EKG prn Minimize sedation >> likely contributing to his hypotension/need for pressors  Prognosis continued to be  guarded  Family- No family present at bedside 5/28 Updated daughter at length see note dated 5/26  The patient is critically ill with multiple organ systems failure and requires high  complexity decision making for assessment and support, frequent evaluation and titration of therapies, application of advanced monitoring technologies and extensive interpretation of multiple databases.   Critical Care Time devoted to patient care services described in this note is  35  Minutes. This time reflects time of care of this signee Dr Jennet Maduro. This critical care time does not reflect procedure time, or teaching time or supervisory time of PA/NP/Med student/Med Resident etc but could involve care discussion time.  Rush Farmer, M.D. [facility] Pulmonary/Critical Care Medicine. Pager: [phone]. After hours pager: [phone].

## 2017-06-07 NOTE — Progress Notes (Signed)
Nutrition Follow-up  DOCUMENTATION CODES:   Not applicable  INTERVENTION:   Vital 1.5 @ 55 ml/hr (1320 ml) via Cortrak  30 Prostat BID  Provides: 2180 kcal, 119 grams protein, and 1008 ml H2O.   NUTRITION DIAGNOSIS:   Increased nutrient needs related to (cancer) as evidenced by estimated needs.  Ongoing  GOAL:   Patient will meet greater than or equal to 90% of their needs  Not meeting- to increase TF  MONITOR:   Diet advancement, I & O's  REASON FOR ASSESSMENT:   Consult Diet education, Calorie Count  ASSESSMENT:   72 year old male here with iron deficiency anemia with recent rectal bleeding found to have abnormal CT scan of the abdomen and pelvis suggestive of a sigmoid cancer.  He has a history of peripheral vascular disease, aortic aneurysm with endovascular repair, CAD, hypertension, hyperlipidemia, COPD, CKD.  5/17 clear liquids 5/19 soft diet, did not want to eat 5/20 back to full liquids, MD ordered ensure enlive TID 5/22 pt re-intubated, Cortrak inserted tip in stomach 5/23 spoke with Hulen Skains, ok to start TF 5/24- TPN weaned and d/c  Pt off pressors.  Attempting to wean.  Currently receiving Vital 1.5 @ 30 ml/hr.  RD to increase rate to meet pt's needs.  Will need to monitor ileostomy for high output.   Patient remains intubated on ventilator support MV: 11.3 L/min Temp (24hrs), Avg:98.5 F (36.9 C), Min:97.6 F (36.4 C), Max:99.1 F (37.3 C) BP: 122/73 MAP: 88  I/O: +8856 ml since admit UOP: 1525 ml x 24 hrs Right JP: 310 ml x 24 hrs  Weight noted increase by 7 lb since last RD visit (161 lb to 168 lb). Deep pitting edema continues. Plan to diurese as tolerated today.   Medications reviewed and include: Vit C, precedex, Lasix 40 mg IV q6 x3 Labs reviewed: Na 149 (H)   Diet Order:   Diet Order           Diet NPO time specified  Diet effective now         EDUCATION NEEDS:   Not appropriate for education at this time  Skin:  Skin  Assessment: Skin Integrity Issues: Skin Integrity Issues:: (MASD, multiple open areas on perineum ) Incisions: closed abdomen  Last BM:  225 ml via ileostomy 5/28  Height:   Ht Readings from Last 1 Encounters:  06/01/17 5\' 10"  (1.778 m)    Weight:   Wt Readings from Last 1 Encounters:  06/07/17 268 lb 4.8 oz (121.7 kg)    Ideal Body Weight:  83.6 kg  BMI:  Body mass index is 38.5 kg/m.  Estimated Nutritional Needs:   Kcal:  2150 kcal  Protein:  105-120 g  Fluid:  >2.1 L/day     Mariana Single RD, LDN Clinical Nutrition Pager # - [phone]

## 2017-06-07 NOTE — Progress Notes (Signed)
Spoke to Dr Nelda Marseille about possible LTAC referral. Plan will be to continue wean, extubate, establish PO feeding in the next few days as patient tolerates. At that time will consider LTAC. CM will continue to follow.

## 2017-06-07 NOTE — Procedures (Signed)
Extubation Procedure Note  Patient Details:   Name: Jesse Le DOB: 10-14-1945 MRN: [id number]   Airway Documentation:    Vent end date: 06/07/17 Vent end time: 1735   Evaluation  O2 sats: stable throughout Complications: No apparent complications Patient did tolerate procedure well. Bilateral Breath Sounds: Rhonchi, Diminished   Yes   Patient self extubated. He is tolerating a nasal cannula well at this time. Patient currently does not have stridor and was able to speak.  Tiburcio Bash 06/07/2017, 5:42 PM

## 2017-06-07 NOTE — Progress Notes (Signed)
PULMONARY / CRITICAL CARE MEDICINE   Name: Jesse Le MRN: [id number] DOB: Jun 08, 1945    ADMISSION DATE:  05/06/2017 CONSULTATION DATE:  05/18/2017  REFERRING MD:  Dr. Terrence Dupont  CHIEF COMPLAINT:  Hypotension  Brief history:   72 yo male presented with abdominal pain, weight loss and intermittent hematochezia.  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  Had low anterior resection with primary anastomosis with diverting loop ileostomy.  Developed hypotension, hypoglycemia 5/07.  Developed hypotension and altered mental status 5/08 and transferred to ICU. Pt has an anastomotic leak, drain placed by surgery PMHx of CAD, DM, TAA.  5/22 Re-consult for progressive hypoxia throughout the night and ongoing confusion, ~70's on RA, placed on NRB then to BiPAP.  CXR- rotated film worsening R multifocal consolidation, small pleural effusions.  Net +21 L.  Hypertensive, afebrile x 48 hours>> Intubated  Subjective: No acute events overnight.  VITAL SIGNS: BP (!) 158/55   Pulse 62   Temp 98.4 F (36.9 C)   Resp (!) 23   Ht 5\' 10"  (1.778 m)   Wt 268 lb 4.8 oz (121.7 kg)   SpO2 100%   BMI 38.50 kg/m   INTAKE / OUTPUT: I/O last 3 completed shifts: In: 5073.4 [I.V.:3558.4; Other:5; NG/GT:1260; IV Piggyback:250] Out: 7209 [Urine:2235; Drains:350; Stool:790]  PHYSICAL EXAMINATION: General:  Elderly male, intubated, on CPAP/P HEENT: NCAT,  ETT secure and  in place Neuro: Arousable, moving all ext to command CV:  S1, S2,RRR No RMG PULM: Coarse breath sounds throughout with a few rhonchit GI: Ileostomy Extremities: Generalized Diffuse 2-3 +edema, nailbeds are pink with  Brisk refill Skin: No obvious rashes, lesions  LABS:  BMET Recent Labs  Lab 06/05/17 0326 06/06/17 0450 06/07/17 0406  NA 142 149* 149*  K 4.3 4.5 4.2  CL 115* 120* 121*  CO2 21* 22 21*  BUN 52* 52* 45*  CREATININE 1.95* 1.98* 1.79*  GLUCOSE 133* 142* 162*    Electrolytes Recent  Labs  Lab 06/01/17 0343 06/02/17 0500 06/03/17 0435 06/04/17 0345 06/05/17 0326 06/06/17 0450 06/07/17 0406  CALCIUM 7.7* 7.5* 7.5* 7.5* 7.3* 7.7* 7.7*  MG 2.1 2.3 2.4 2.5*  --   --   --   PHOS 3.1 5.3* 3.3  --   --   --   --     CBC Recent Labs  Lab 06/05/17 0326 06/06/17 0450 06/07/17 0406  WBC 7.8 5.9 4.8  HGB 7.4* 7.7* 7.9*  HCT 25.1* 25.8* 26.3*  PLT 183 169 142*    Coag's No results for input(s): APTT, INR in the last 168 hours.  Sepsis Markers No results for input(s): LATICACIDVEN, PROCALCITON, O2SATVEN in the last 168 hours.  ABG Recent Labs  Lab 06/05/17 0327 06/06/17 0435 06/06/17 0735  PHART 7.339* 7.389 7.425  PCO2ART 37.1 29.8* 29.3*  PO2ART 86.0 100 73.0*    Liver Enzymes Recent Labs  Lab 06/02/17 0500 06/05/17 0326 06/06/17 0450  AST 33 41 38  ALT 38 31 30  ALKPHOS 126 155* 143*  BILITOT 1.2 1.3* 1.1  ALBUMIN 1.2* 1.1* <1.0*    Cardiac Enzymes No results for input(s): TROPONINI, PROBNP in the last 168 hours.  Glucose Recent Labs  Lab 06/06/17 1248 06/06/17 1605 06/06/17 2003 06/06/17 2345 06/07/17 0407 06/07/17 0733  GLUCAP 126* 140* 84 163* 143* 139*    Imaging Dg Chest Port 1 View  Result Date: 06/07/2017 CLINICAL DATA:  Intubation.  Respiratory failure. EXAM: PORTABLE CHEST 1 VIEW COMPARISON:  06/06/2017. FINDINGS: Endotracheal tube, feeding tube, right IJ line stable position. Heart size normal. Aortic stent graft in stable position. Persistent right upper lobe and left lower lobe infiltrates. Persistent bibasilar atelectasis. Tiny left pleural effusion cannot be excluded. No pneumothorax. IMPRESSION: 1. Lines and tubes in stable position. Aortic stent graft in stable position. 2. Persistent right upper lobe and left lower lobe infiltrate. Persistent bibasilar atelectasis. Tiny left pleural effusion cannot be excluded. Chest is unchanged from prior exam. Electronically Signed   By: Marcello Moores  Register   On: 06/07/2017 07:44    STUDIES:  CT abd 5/09 >> discontinuity in the posterior wall just above the anastomosis with feculent appearing material and gas in the pelvis, measuring up to 5x 3 cm anterior and superior to the sigmoid colon.  Fluid collection right lower quadrant measuring up to 6.4 x 3.5 cm. Moderate pneumoperitoneum.   CULTURES: MRSA PCR 5/8 >> neg Blood culture 5/8 >> neg Abd ascess 5/16 >> pseudomonas > pan sensitive Blood culture 5/22>> Neg Urine culture 5/22>> No growth Bronchial alveolar lavage 5/22>> Candida Glabrata>> suspect contaminent  ANTIBIOTICS: Zosyn 5/7 > 5/8 Zosyn 5/10>>5/30 per ID recs Unasyn 5/8 > 5/10 Vancomycin 5/7 >5/20 Eraxis 5/8 > 5/18  LINES/TUBES: Lt IJ CVL  5/8 > 5/10 JP Drain 5/1> RUE PICC 5/10 >>5/23 Right IJ CVC 5/22>>  DISCUSSION: 72 yo male with sepsis from peritonitis after surgery for colon cancer and CT abd +  for anastomotic leak.>> Drain placed 5/1.  Pt with acute hypoxic respiratory failure on 5/22 requiring intubation.  Concern for Aspiration.  ASSESSMENT / PLAN: Pulm: A Acute Hypoxic respiratory insufficiency 2/2 R lung consolidation HAP vs aspiration pneumonitis  Continue PRVC 8 cc/kg, wean PEEP from 10 to 5 5/24 On PSV 10/5 CXR 5/27 & 5/28>> stable RUL pneumonia  P: CXR prn Titrate oxygen and PEEP for sats > 92% VAP prevention bundle Pulmonary hygiene Zosyn as below SBT May need trach if current weaning is unsuccessful  Card:  A: Hypertension>>resolved, Intermittent hypotension likely in setting of vent/sedation Bradycardia>>likely in setting or precedex gtt Norepinephrine off since 5/24 Metoprolol on hold P: Monitor QTc EKG prn Minimize sedation >> likely contributing to his hypotension/need for pressors  GI A: Ilesotomy/ JP/ Open abdominal wound Hypoalbuminemia>> less than 1 on 5/27 TNA stopped 5/24 TF started 5/24>> vomited large amount 5/25 am Bicarb down trending on CMET, and ABG ? High volume loss from  illeostomy P: Continue TF at 30 cc /hr>> tolerating well CMET prn to monitor albumin Monitor ileostomy/drain for high output ABG  prn Consider immodium/ lomotil per Surgery if needed  ID: A Sepsis with septic shock from peritonitis- resolved  Abd abscess w/ pseudomonas  Colon cancer s/p low anterior resection with ileostomy w/anastomosis leak  UA w/ +Leukocytes & many bacteria>> UC negative on 5/23 ( Leukocytosis resolving) Probable aspiration pneumonia Wound debridement / dressing change per surgery 5/25/am P: Continue Zosyn fpr total of 10 days (through 5/30) per ID. Then repeat CT abdomen to assess intra-abdominal abscess. May need to be drained if no improvement Trend Fever and WBC (WBC  Continues to Down trend 5/27) Abdominal drain in place, surgery following  Renal: A: Acute renal failure from ATN. sCr down-trending from 1.98 to 1.79 UOP 1.5L. Net +9L since admit Hypernatremia, Na 149 Hyperchloremia P:   Diuresis as he can tolerate, hematin Amick instability has precluded this recently Follow urine output, serum creatinine, electrolytes Replete electrolytes prn Continue free water  Hem: A: Anemia HGB 7.4 >>7.9 ( ?  if hemo dilutional). B/l 8-9 P: Follow CBC Transfusion threshold hemoglobin < 7 Monitor for any obvious bleeding  Endocrine: A: DM type II. Hypoglycemia in setting of sepsis- resolved now hyperglycemic  P: ICU hyperglycemia protocol CBG's Sliding scale insulin  Neuro: A: Acute metabolic encephalopathy 2nd to sepsis- Slowly improving P: RASS goal 0 to -1 Continue Precedex drip>> wean as able Minimize/ Avoid  other sedating medications if able Daily wake up assessment   VTE prophylaxis -SCD's only (given hx of retroperitoneal bleed) SUP-Pepcid Nutrition - Transitioned to TF 5/24 Goals of care- Full code  Prognosis continued to be  guarded  Family- No family present at bedside 5/28 Updated daughter at length see note dated  5/26   Wendee Beavers PGY-3 Pager [phone] 06/07/17  9:21 AM

## 2017-06-07 NOTE — Progress Notes (Signed)
27 Days Post-Op    CC: Abdominal pain and poor appetite  Subjective: Patient is sedated on the ventilator.  He will wake up and look around some and removing tape from his lower abdominal wound caused him to look around to be little agitated.  He slept through majority of the exam and dressing change.  Objective: Vital signs in last 24 hours: Temp:  [97.6 F (36.4 C)-99.1 F (37.3 C)] 98.4 F (36.9 C) (05/28 0700) Pulse Rate:  [50-76] 62 (05/28 0700) Resp:  [19-28] 23 (05/28 0749) BP: (84-158)/(26-80) 158/55 (05/28 0749) SpO2:  [94 %-100 %] 100 % (05/28 0749) Arterial Line BP: (115-147)/(41-56) 140/49 (05/28 0700) FiO2 (%):  [40 %] 40 % (05/28 0749) Weight:  [121.7 kg (268 lb 4.8 oz)] 121.7 kg (268 lb 4.8 oz) (05/28 0414) Last BM Date: 06/07/17(Ileostomy)  2200  IV 920 tube feeding To 50 IV piggyback Urine 1525 Drains 310 Ileostomy 750 Afebrile with variable blood pressure between 84 - 643 systolic Heart rate in the 60 range.  Complete ventilator support FiO2 100% NA 149, chloride 121, glucose 162, creatinine 1.79 WBC 4.8, hemoglobin 7.9/hematocrit 26.3/platelets 142 Single view chest x-ray: Endotracheal tube, feeding tube right IJ stable.  Normal heart size with aortic stent graft in position right upper lobe and left lower lobe infiltrates with persistent bilateral atelectasis.  Possible small left pleural effusion Intake/Output from previous day: 05/27 0701 - 05/28 0700 In: 3111.2 [I.V.:1941.2; NG/GT:920; IV Piggyback:250] Out: 2585 [Urine:1525; Drains:310; Stool:750] Intake/Output this shift: Total I/O In: 148.8 [I.V.:148.8] Out: 225 [Urine:225]  General appearance: Sedated on the vent.  He does wake up and respond some.  Slept through most of the exam and dressing change. Resp: Clear anterior with full ventilator support.  Tube feeding in place. GI: Open abdominal wound see picture below.  Bowel sounds are hypoactive but he does have some output through his ostomy.   IR drain has milky whitish colored fluid.  Old drain site is clean and repacked.  The open wound Kerlix dressing has a greenish tint on the dressing. Male genitalia: Foley in place with significant swelling/anasarca Extremities: Is essential of the body anasarca       Lab Results:  Recent Labs    06/06/17 0450 06/07/17 0406  WBC 5.9 4.8  HGB 7.7* 7.9*  HCT 25.8* 26.3*  PLT 169 142*    BMET Recent Labs    06/06/17 0450 06/07/17 0406  NA 149* 149*  K 4.5 4.2  CL 120* 121*  CO2 22 21*  GLUCOSE 142* 162*  BUN 52* 45*  CREATININE 1.98* 1.79*  CALCIUM 7.7* 7.7*   PT/INR No results for input(s): LABPROT, INR in the last 72 hours.  Recent Labs  Lab 06/02/17 0500 06/05/17 0326 06/06/17 0450  AST 33 41 38  ALT 38 31 30  ALKPHOS 126 155* 143*  BILITOT 1.2 1.3* 1.1  PROT 5.5* 5.6* 5.5*  ALBUMIN 1.2* 1.1* <1.0*     Lipase     Component Value Date/Time   LIPASE 22 05/06/2017 1036     Medications: . chlorhexidine gluconate (MEDLINE KIT)  15 mL Mouth Rinse BID  . Chlorhexidine Gluconate Cloth  6 each Topical Daily  . famotidine  40 mg Per Tube Daily  . feeding supplement (PRO-STAT SUGAR FREE 64)  30 mL Per Tube BID  . feeding supplement (VITAL 1.5 CAL)  1,000 mL Per Tube Q24H  . free water  100 mL Per Tube Q8H  . insulin aspart  0-15 Units  Subcutaneous Q4H  . lip balm  1 application Topical BID  . mouth rinse  15 mL Mouth Rinse 10 times per day  . sodium chloride flush  5 mL Intracatheter Q8H  . vitamin C  500 mg Per Tube Daily   . dexmedetomidine (PRECEDEX) IV infusion 0.8 mcg/kg/hr (06/07/17 4403)  . lactated ringers 50 mL/hr at 06/07/17 0143  . norepinephrine (LEVOPHED) Adult infusion Stopped (06/03/17 1324)  . piperacillin-tazobactam (ZOSYN)  IV 3.375 g (06/07/17 0503)   Anti-infectives (From admission, onward)   Start     Dose/Rate Route Frequency Ordered Stop   05/28/17 1200  vancomycin (VANCOCIN) 1,250 mg in sodium chloride 0.9 % 250 mL IVPB  Status:   Discontinued     1,250 mg 166.7 mL/hr over 90 Minutes Intravenous Every 24 hours 05/27/17 1324 05/30/17 1615   05/27/17 1100  vancomycin (VANCOCIN) 1,250 mg in sodium chloride 0.9 % 250 mL IVPB  Status:  Discontinued     1,250 mg 166.7 mL/hr over 90 Minutes Intravenous Every 12 hours 05/27/17 1011 05/27/17 1324   05/20/17 2230  vancomycin (VANCOCIN) 1,250 mg in sodium chloride 0.9 % 250 mL IVPB  Status:  Discontinued     1,250 mg 166.7 mL/hr over 90 Minutes Intravenous Every 24 hours 05/20/17 2151 05/27/17 1011   05/20/17 1400  piperacillin-tazobactam (ZOSYN) IVPB 3.375 g     3.375 g 12.5 mL/hr over 240 Minutes Intravenous Every 8 hours 05/20/17 1044     05/20/17 0500  anidulafungin (ERAXIS) 100 mg in sodium chloride 0.9 % 100 mL IVPB  Status:  Discontinued     100 mg 78 mL/hr over 100 Minutes Intravenous Every 24 hours 05/19/17 0116 05/29/17 1214   05/19/17 2200  vancomycin (VANCOCIN) 1,500 mg in sodium chloride 0.9 % 500 mL IVPB  Status:  Discontinued     1,500 mg 250 mL/hr over 120 Minutes Intravenous Every 24 hours 05/18/17 2126 05/20/17 2150   05/19/17 0130  anidulafungin (ERAXIS) 200 mg in sodium chloride 0.9 % 200 mL IVPB     200 mg 78 mL/hr over 200 Minutes Intravenous  Once 05/19/17 0116 05/19/17 0520   05/19/17 0130  Ampicillin-Sulbactam (UNASYN) 3 g in sodium chloride 0.9 % 100 mL IVPB  Status:  Discontinued     3 g 200 mL/hr over 30 Minutes Intravenous Every 8 hours 05/19/17 0118 05/20/17 1044   05/18/17 2200  vancomycin (VANCOCIN) 1,750 mg in sodium chloride 0.9 % 500 mL IVPB     1,750 mg 250 mL/hr over 120 Minutes Intravenous  Once 05/18/17 2126 05/19/17 0400   05/17/17 0930  piperacillin-tazobactam (ZOSYN) IVPB 3.375 g  Status:  Discontinued     3.375 g 12.5 mL/hr over 240 Minutes Intravenous Every 8 hours 05/17/17 0836 05/19/17 0118   05/11/17 2200  cefoTEtan (CEFOTAN) 2 g in sodium chloride 0.9 % 100 mL IVPB     2 g 200 mL/hr over 30 Minutes Intravenous Every 12 hours  05/11/17 1839 05/11/17 2034   05/11/17 0600  cefoTEtan in Dextrose 5% (CEFOTAN) IVPB 2 g     2 g Intravenous On call to O.R. 05/10/17 1142 05/11/17 1416     Assessment/Plan Coronary artery disease. 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  1.  Low anterior resection with primary colorectal anastomosis, diverting loop ileostomy, rigid proctoscopy, umbilical hernia repair 01/13/2239 Dr. Donnie Mesa  - Postop anastomotic leak  - Wound dehiscence/possible exposed bowel at the base of the wound.  Ventilator dependent respiratory failure -failed attempted extubation   FEN:  IV fluids/Tube feeding ID:  Cefotetan pre op;  Vancomycin 5/8 -05/30/17; Eraxis 5/9 - 05/29/17; Zosyn 5/7 =>> day 21 DVT: SCD Follow up:  Dr. Georgette Dover  Plan: Continue current support.  Appreciate CCM's assistance.  Continue wet-to-dry dressing changes.       LOS: 32 days    Charlita Brian 06/07/2017 [phone]

## 2017-06-07 NOTE — Progress Notes (Signed)
Subjective:  Patient remains intubated sedated. Off pressors.  Objective:  Vital Signs in the last 24 hours: Temp:  [97.6 F (36.4 C)-99.1 F (37.3 C)] 98.2 F (36.8 C) (05/28 1200) Pulse Rate:  [50-76] 57 (05/28 1200) Resp:  [19-28] 21 (05/28 1151) BP: (84-158)/(26-80) 117/49 (05/28 1200) SpO2:  [99 %-100 %] 100 % (05/28 1200) Arterial Line BP: (116-160)/(41-54) 151/48 (05/28 1200) FiO2 (%):  [40 %] 40 % (05/28 1151) Weight:  [121.7 kg (268 lb 4.8 oz)] 121.7 kg (268 lb 4.8 oz) (05/28 0414)  Intake/Output from previous day: 05/27 0701 - 05/28 0700 In: 3111.2 [I.V.:1941.2; NG/GT:920; IV Piggyback:250] Out: 2585 [Urine:1525; Drains:310; Stool:750] Intake/Output from this shift: Total I/O In: 546.4 [I.V.:246.4; NG/GT:300] Out: 940 [Urine:905; Drains:35]  Physical Exam: Neck: no adenopathy, no carotid bruit, no JVD and supple, symmetrical, trachea midline Lungs: Clear anteriorly decreased breath sounds at bases with rhonchi Heart: regular rate and rhythm, S1, S2 normal and Soft systolic murmur noted Abdomen: Distended abdominal binder and surgical dressing noted Extremities: No clubbing cyanosis and generalised  edema noted  Lab Results: Recent Labs    06/06/17 0450 06/07/17 0406  WBC 5.9 4.8  HGB 7.7* 7.9*  PLT 169 142*   Recent Labs    06/06/17 0450 06/07/17 0406  NA 149* 149*  K 4.5 4.2  CL 120* 121*  CO2 22 21*  GLUCOSE 142* 162*  BUN 52* 45*  CREATININE 1.98* 1.79*   No results for input(s): TROPONINI in the last 72 hours.  Invalid input(s): CK, MB Hepatic Function Panel Recent Labs    06/06/17 0450  PROT 5.5*  ALBUMIN <1.0*  AST 38  ALT 30  ALKPHOS 143*  BILITOT 1.1   No results for input(s): CHOL in the last 72 hours. No results for input(s): PROTIME in the last 72 hours.  Imaging: Imaging results have been reviewed and Dg Chest Port 1 View  Result Date: 06/07/2017 CLINICAL DATA:  Intubation.  Respiratory failure. EXAM: PORTABLE CHEST 1  VIEW COMPARISON:  06/06/2017. FINDINGS: Endotracheal tube, feeding tube, right IJ line stable position. Heart size normal. Aortic stent graft in stable position. Persistent right upper lobe and left lower lobe infiltrates. Persistent bibasilar atelectasis. Tiny left pleural effusion cannot be excluded. No pneumothorax. IMPRESSION: 1. Lines and tubes in stable position. Aortic stent graft in stable position. 2. Persistent right upper lobe and left lower lobe infiltrate. Persistent bibasilar atelectasis. Tiny left pleural effusion cannot be excluded. Chest is unchanged from prior exam. Electronically Signed   By: Marcello Moores  Register   On: 06/07/2017 07:44   Dg Chest Port 1 View  Result Date: 06/06/2017 CLINICAL DATA:  Respiratory failure.  Follow-up exam. EXAM: PORTABLE CHEST 1 VIEW COMPARISON:  06/05/2017 and older exams. FINDINGS: There is persistent patchy consolidation in the right upper lobe and at the left lung base, similar to the most recent prior exam. Right upper lobe consolidation has improved compared to exams from 06/01/2017 and 06/03/2017. No new lung abnormalities. Probable small effusions. No pneumothorax. Endotracheal tube, nasal/orogastric tube and right internal jugular central venous line are stable. IMPRESSION: 1. No significant change from the previous day's study. 2. Patchy right upper lobe airspace opacity consistent with residual pneumonia. Left lung base opacity may reflect additional pneumonia, atelectasis or a combination. 3. Support apparatus is stable and well positioned. Electronically Signed   By: Lajean Manes M.D.   On: 06/06/2017 07:52    Cardiac Studies:  Assessment/Plan:  Rectosigmoid adenocarcinoma status post resection with primary colorectal anastomosis  and  diverting  loop ileostomy and umbilical hernia repair Acute respiratory failure secondary to aspiration pneumonia status post bronchoscopy and suctioning of right middle and lower lobe with partial  clearing Spontaneous retroperitoneal hematoma Abdominalsurgical wound infectionwith bowel exposed. 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 May need repeat CT to evaluate status of intra-abdominal abscess next week.   LOS: 32 days    Jesse Le 06/07/2017, 12:40 PM

## 2017-06-07 NOTE — Progress Notes (Signed)
Patient self extubated. Patient is tolerating well at this time with a nasal cannula. RN to notify MD.

## 2017-06-07 NOTE — Progress Notes (Signed)
Patient self extubated at Norma Fredrickson, MD with Ccm notified. Patient placed on 4L nasal cannula with humidification per respiratory therapy. Mittens, Precedex, and tube feedings remain on at this time. Cortrak still at previous markings.

## 2017-06-08 ENCOUNTER — Inpatient Hospital Stay (HOSPITAL_COMMUNITY): Payer: Medicare Other

## 2017-06-08 DIAGNOSIS — R5381 Other malaise: Secondary | ICD-10-CM

## 2017-06-08 DIAGNOSIS — R188 Other ascites: Secondary | ICD-10-CM

## 2017-06-08 LAB — POCT I-STAT 3, ART BLOOD GAS (G3+)
ACID-BASE DEFICIT: 4 mmol/L — AB (ref 0.0–2.0)
Bicarbonate: 20.8 mmol/L (ref 20.0–28.0)
O2 SAT: 97 %
PCO2 ART: 35.4 mmHg (ref 32.0–48.0)
Patient temperature: 37.1
TCO2: 22 mmol/L (ref 22–32)
pH, Arterial: 7.378 (ref 7.350–7.450)
pO2, Arterial: 95 mmHg (ref 83.0–108.0)

## 2017-06-08 LAB — GLUCOSE, CAPILLARY
GLUCOSE-CAPILLARY: 127 mg/dL — AB (ref 65–99)
GLUCOSE-CAPILLARY: 162 mg/dL — AB (ref 65–99)
GLUCOSE-CAPILLARY: 174 mg/dL — AB (ref 65–99)
GLUCOSE-CAPILLARY: 177 mg/dL — AB (ref 65–99)
GLUCOSE-CAPILLARY: 181 mg/dL — AB (ref 65–99)
Glucose-Capillary: 161 mg/dL — ABNORMAL HIGH (ref 65–99)
Glucose-Capillary: 178 mg/dL — ABNORMAL HIGH (ref 65–99)

## 2017-06-08 LAB — CBC
HEMATOCRIT: 25.9 % — AB (ref 39.0–52.0)
Hemoglobin: 7.8 g/dL — ABNORMAL LOW (ref 13.0–17.0)
MCH: 26.1 pg (ref 26.0–34.0)
MCHC: 30.1 g/dL (ref 30.0–36.0)
MCV: 86.6 fL (ref 78.0–100.0)
Platelets: 131 10*3/uL — ABNORMAL LOW (ref 150–400)
RBC: 2.99 MIL/uL — AB (ref 4.22–5.81)
RDW: 26 % — AB (ref 11.5–15.5)
WBC: 6.2 10*3/uL (ref 4.0–10.5)

## 2017-06-08 LAB — BASIC METABOLIC PANEL
ANION GAP: 7 (ref 5–15)
BUN: 39 mg/dL — ABNORMAL HIGH (ref 6–20)
CALCIUM: 7.7 mg/dL — AB (ref 8.9–10.3)
CO2: 22 mmol/L (ref 22–32)
Chloride: 119 mmol/L — ABNORMAL HIGH (ref 101–111)
Creatinine, Ser: 1.71 mg/dL — ABNORMAL HIGH (ref 0.61–1.24)
GFR calc Af Amer: 44 mL/min — ABNORMAL LOW (ref >60)
GFR calc non Af Amer: 38 mL/min — ABNORMAL LOW (ref >60)
Glucose, Bld: 205 mg/dL — ABNORMAL HIGH (ref 65–99)
POTASSIUM: 3.7 mmol/L (ref 3.5–5.1)
Sodium: 148 mmol/L — ABNORMAL HIGH (ref 135–145)

## 2017-06-08 LAB — PHOSPHORUS: PHOSPHORUS: 3.4 mg/dL (ref 2.5–4.6)

## 2017-06-08 LAB — MAGNESIUM: Magnesium: 1.8 mg/dL (ref 1.7–2.4)

## 2017-06-08 MED ORDER — MAGNESIUM SULFATE 2 GM/50ML IV SOLN
2.0000 g | Freq: Once | INTRAVENOUS | Status: AC
  Administered 2017-06-08: 2 g via INTRAVENOUS
  Filled 2017-06-08: qty 50

## 2017-06-08 MED ORDER — POTASSIUM CHLORIDE 10 MEQ/50ML IV SOLN
10.0000 meq | INTRAVENOUS | Status: AC
  Administered 2017-06-08 (×4): 10 meq via INTRAVENOUS
  Filled 2017-06-08 (×4): qty 50

## 2017-06-08 MED ORDER — DEXMEDETOMIDINE HCL IN NACL 200 MCG/50ML IV SOLN
0.4000 ug/kg/h | INTRAVENOUS | Status: DC
  Administered 2017-06-08 – 2017-06-09 (×12): 0.8 ug/kg/h via INTRAVENOUS
  Filled 2017-06-08 (×4): qty 50
  Filled 2017-06-08: qty 200
  Filled 2017-06-08: qty 50
  Filled 2017-06-08: qty 150
  Filled 2017-06-08 (×2): qty 50

## 2017-06-08 MED ORDER — FUROSEMIDE 10 MG/ML IJ SOLN
40.0000 mg | Freq: Four times a day (QID) | INTRAMUSCULAR | Status: AC
  Administered 2017-06-08 (×3): 40 mg via INTRAVENOUS
  Filled 2017-06-08 (×3): qty 4

## 2017-06-08 MED ORDER — POTASSIUM CHLORIDE 10 MEQ/100ML IV SOLN: 10.0000 meq | INTRAVENOUS | Status: DC

## 2017-06-08 NOTE — Consult Note (Addendum)
[place] Nurse wound and ostomy follow up Surgical team following for assessment and plan of care for abd wound; requested to apply Vac despite wound appearance.  VAC therapy literature recommends there be less than 20% of nonviable tissue in the woundbed; this wound has 20% slough/eschar to the right edges.  Discussed plan of care with Will, PA, who states that the surgeons request Vac be applied and they are aware of the wound appearance.  Stoma type/location:Ileostomy surgery performed on 5/1 Stoma red and viable, 1 inch, above skin level. Previously noted mucocutaneous separation has decreased in size and depth to outer stomafrom 2:00 o'clock to 5:00 o'clock, with depth of .1cm, red and moist;applied barrier ring to assist with protecting site and promotehealing. Output:large amt liquid yellow stool Ostomy pouching:2pc.  Education provided:Pt is in ICU and remains critically ill; no family present for teaching session.He did not watch the pouch change procedure or ask questions.Supplies at bedside for staff nurse use. Ptwill not be able to change or empty pouch without total assistance after discharge. Enrolled patient in [facility] Discharge program:Yes, awhile ago [facility] team will continue to follow while in the hospital   Reason for Consult: Vac applied to abd wound Wound type: Full thickness post-op wound to midline abd 17X12X3cm, undermining to 2 cm on wound edges from 4:00 o'clock to 6:00 o'clock Wound bed: 20% slough/eschar to right wound, 80% red with visible retention sutures to rest of wound Drainage (amount, consistency, odor) small amt yellow drainage, no odor Dressing procedure/placement/frequency: Applied Mepitel contact layer to protect the wound bed, then one piece white foam, then one piece black foam to 148mm cont suction.  Pt tolerated with minimal amt discomfort. Abd binder reapplied over abd.  Previously noted skin breakdown to meatus has resolved;  foley in place. Left abd with full thickness wound; 1.5X1.5X.2cm, 90% red, 10% slough, no odor or drainage.  Foam dressing applied.  [facility] team will change Vac and ostomy pouch Q M/W/F Julien Girt MSN, RN, [place], [place], [place]

## 2017-06-08 NOTE — Progress Notes (Signed)
28 Days Post-Op    CC:  Abdominal pain and poor appetite    Subjective: Self extubated but talking to himself, rambling.  Seems to tolerate whatever we do.  The wound still has some greenish tint on the dressing especially around the necrotic areas.    Objective: Vital signs in last 24 hours: Temp:  [97.9 F (36.6 C)-100 F (37.8 C)] 98.2 F (36.8 C) (05/29 0700) Pulse Rate:  [54-98] 74 (05/29 0700) Resp:  [21-23] 23 (05/28 1540) BP: (67-142)/(47-79) 117/59 (05/29 0700) SpO2:  [98 %-100 %] 100 % (05/29 0700) Arterial Line BP: (113-160)/(31-55) 141/49 (05/29 0700) FiO2 (%):  [40 %] 40 % (05/28 1540) Weight:  [116.5 kg (256 lb 13.4 oz)] 116.5 kg (256 lb 13.4 oz) (05/29 0500) Last BM Date: 06/07/17 723 IV 1569 NG/tube feeding 4780 urine 160 drain 1550 stool Afebrile, BP down some yesterday ABG good on [place] Na 148 Creatinine 1.71 K+ 3.7 WBC 6.2 H/H is stable  Intake/Output from previous day: 05/28 0701 - 05/29 0700 In: 2452.1 [I.V.:723; NG/GT:1569.1; IV Piggyback:150] Out: 2841 [Urine:4780; Drains:160; LKGMW:1027] Intake/Output this shift: No intake/output data recorded.  General appearance: alert, cooperative and no distress Resp: clear to auscultation bilaterally and anterior, on [place] with good saturations GI: large open abdomen, no real change from yesterday, on tube feedings with good ileostomy output  Lab Results:  Recent Labs    06/07/17 0406 06/08/17 0429  WBC 4.8 6.2  HGB 7.9* 7.8*  HCT 26.3* 25.9*  PLT 142* 131*    BMET Recent Labs    06/07/17 0406 06/08/17 0429  NA 149* 148*  K 4.2 3.7  CL 121* 119*  CO2 21* 22  GLUCOSE 162* 205*  BUN 45* 39*  CREATININE 1.79* 1.71*  CALCIUM 7.7* 7.7*   PT/INR No results for input(s): LABPROT, INR in the last 72 hours.  Recent Labs  Lab 06/02/17 0500 06/05/17 0326 06/06/17 0450  AST 33 41 38  ALT 38 31 30  ALKPHOS 126 155* 143*  BILITOT 1.2 1.3* 1.1  PROT 5.5* 5.6* 5.5*  ALBUMIN 1.2* 1.1* <1.0*      Lipase     Component Value Date/Time   LIPASE 22 05/06/2017 1036     Medications: . chlorhexidine  15 mL Mouth Rinse BID  . Chlorhexidine Gluconate Cloth  6 each Topical Daily  . famotidine  40 mg Per Tube Daily  . feeding supplement (PRO-STAT SUGAR FREE 64)  30 mL Per Tube BID  . feeding supplement (VITAL 1.5 CAL)  1,000 mL Per Tube Q24H  . free water  250 mL Per Tube Q6H  . insulin aspart  0-15 Units Subcutaneous Q4H  . lip balm  1 application Topical BID  . mouth rinse  15 mL Mouth Rinse q12n4p  . sodium chloride flush  5 mL Intracatheter Q8H  . vitamin C  500 mg Per Tube Daily    Assessment/Plan Coronary artery disease. 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 1.  Low anterior resection with primary colorectal anastomosis, diverting loop ileostomy, rigid proctoscopy, umbilical hernia repair 02/15/3662 Dr. Donnie Mesa  - Postop anastomotic leak  - Wound dehiscence/possible exposed bowel at the base of the wound.  Ventilator dependent respiratory failure -failed attempted  extubation - self extubated 06/07/17 ~1600  =>>[place] good saturations on [place]  FEN:  IV fluids/Tube feeding ID:  Cefotetan pre op;  Vancomycin 5/8 -05/30/17; Eraxis 5/9 - 05/29/17; Zosyn 5/7 =>> day 21 DVT: SCD Follow up:  Dr. Georgette Dover  Plan:  Dr. Ninfa Linden wants to try the wound vac.  I will talk with wound ostomy nurses and get it started.         LOS: 33 days    Walburga Hudman 06/08/2017 [phone]

## 2017-06-08 NOTE — Progress Notes (Signed)
PULMONARY / CRITICAL CARE MEDICINE   Name: Benyamin Jeff MRN: [id number] DOB: Feb 09, 1945    ADMISSION DATE:  05/06/2017 CONSULTATION DATE:  05/18/2017  REFERRING MD:  Dr. Terrence Dupont  CHIEF COMPLAINT:  Hypotension  HISTORY OF PRESENT ILLNESS:   72 yo male presented with abdominal pain, weight loss and intermittent hematochezia.  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  Had low anterior resection with primary anastomosis with diverting loop ileostomy.  Developed hypotension, hypoglycemia 5/07.  Developed hypotension and altered mental status 5/08 and transferred to ICU. Pt has an anastomotic leak, drain placed by surgery PMHx of CAD, DM, TAA.  5/22 Re-consult for progressive hypoxia throughout the night and ongoing confusion, ~70's on RA, placed on NRB then to BiPAP.  CXR- rotated film worsening R multifocal consolidation, small pleural effusions.  Net +21 L.  Hypertensive, afebrile x 48 hours>> Intubated  Subjective: Self extubated overnight, remains hypoxemic Diuresed very well overnight.  VITAL SIGNS: BP (!) 117/59   Pulse 74   Temp 98.2 F (36.8 C)   Resp (!) 23   Ht 5\' 10"  (1.778 m)   Wt 256 lb 13.4 oz (116.5 kg)   SpO2 100%   BMI 36.85 kg/m   INTAKE / OUTPUT: I/O last 3 completed shifts: In: 3829.3 [I.V.:1640.2; Other:10; NG/GT:1929.1; IV Piggyback:250] Out: 6734 [Urine:5305; Drains:310; LPFXT:0240]  PHYSICAL EXAMINATION: General:  Elderly male, resting comfortably in exam bed with moderate WOB and weak cough HEENT: [place]/AT, PERRL, EOM-I and DMM Neuro: Agitated, remains on precedex drip CV: RRR, Nl S1/S2 and -M/R/G PULM: Bibasilar crackles GI: Post op abdomen, drain in place, NT, ND and +BS Extremities: 2+ edema and -tenderness Skin: Wound noted  LABS:  BMET Recent Labs  Lab 06/06/17 0450 06/07/17 0406 06/08/17 0429  NA 149* 149* 148*  K 4.5 4.2 3.7  CL 120* 121* 119*  CO2 22 21* 22  BUN 52* 45* 39*  CREATININE 1.98* 1.79*  1.71*  GLUCOSE 142* 162* 205*   Electrolytes Recent Labs  Lab 06/02/17 0500 06/03/17 0435 06/04/17 0345  06/06/17 0450 06/07/17 0406 06/08/17 0429  CALCIUM 7.5* 7.5* 7.5*   < > 7.7* 7.7* 7.7*  MG 2.3 2.4 2.5*  --   --   --  1.8  PHOS 5.3* 3.3  --   --   --   --  3.4   < > = values in this interval not displayed.   CBC Recent Labs  Lab 06/06/17 0450 06/07/17 0406 06/08/17 0429  WBC 5.9 4.8 6.2  HGB 7.7* 7.9* 7.8*  HCT 25.8* 26.3* 25.9*  PLT 169 142* 131*   Coag's No results for input(s): APTT, INR in the last 168 hours.  Sepsis Markers No results for input(s): LATICACIDVEN, PROCALCITON, O2SATVEN in the last 168 hours.  ABG Recent Labs  Lab 06/06/17 0435 06/06/17 0735 06/08/17 0435  PHART 7.389 7.425 7.378  PCO2ART 29.8* 29.3* 35.4  PO2ART 100 73.0* 95.0   Liver Enzymes Recent Labs  Lab 06/02/17 0500 06/05/17 0326 06/06/17 0450  AST 33 41 38  ALT 38 31 30  ALKPHOS 126 155* 143*  BILITOT 1.2 1.3* 1.1  ALBUMIN 1.2* 1.1* <1.0*   Cardiac Enzymes No results for input(s): TROPONINI, PROBNP in the last 168 hours.  Glucose Recent Labs  Lab 06/07/17 1202 06/07/17 1549 06/07/17 2048 06/08/17 0027 06/08/17 0437 06/08/17 0826  GLUCAP 186* 147* 161* 174* 181* 161*   Imaging Dg Chest Port 1 View  Result Date: 06/08/2017 CLINICAL DATA:  72 year old male status post low anterior resection the 1st week of this month for primary rectosigmoid adenocarcinoma. Postop anastomotic leak, wound dehiscence. Extubated last night. EXAM: PORTABLE CHEST 1 VIEW COMPARISON:  06/07/2017 and earlier. FINDINGS: Portable AP semi upright view at 0541 hours. Endotracheal tube removed. Enteric feeding tube remains in place. Stable right IJ central line. Stable lung volumes. Continued streaky opacity in the right upper lung including along the upper lobe border of the minor fissure, stable since 06/06/2017. Mildly increased veiling bilateral lung base opacity. No pneumothorax. The left  mid and upper lung remain clear. Thoracoabdominal aortic endograft re-demonstrated. Stable cardiac size and mediastinal contours. IMPRESSION: 1. Extubated.  Otherwise stable lines and tubes. 2. Continued streaky right upper lung opacity could reflect atelectasis or infection. 3. Probable small bilateral pleural effusions. Electronically Signed   By: Genevie Ann M.D.   On: 06/08/2017 08:59   STUDIES:  CT abd 5/09 >> discontinuity in the posterior wall just above the anastomosis with feculent appearing material and gas in the pelvis, measuring up to 5x 3 cm anterior and superior to the sigmoid colon.  Fluid collection right lower quadrant measuring up to 6.4 x 3.5 cm. Moderate pneumoperitoneum.  CULTURES: MRSA PCR 5/8 >> neg Blood culture 5/8 >> neg Abd ascess 5/16 >> pseudomonas > pan sensitive Blood culture 5/22>> Urine culture 5/22>> No growth Bronchial alveolar lavage 5/22>> Candida Glabrata>> suspect contaminent  ANTIBIOTICS: Zosyn 5/7 > 5/8 Zosyn 5/10>> Unasyn 5/8 > 5/10 Vancomycin 5/7 >5/20 Eraxis 5/8 > 5/18  LINES/TUBES: Lt IJ CVL  5/8 > 5/10 JP Drain 5/1> RUE PICC 5/10 >>5/23 Right IJ CVC 5/22>> ETT 5/22>>>  DISCUSSION: 72 yo male with sepsis from peritonitis after surgery for colon cancer and CT abd +  for anastomotic leak.>> Drain placed 5/1.  Pt with acute hypoxic respiratory failure on 5/22 requiring intubation.  Concern for Aspiration.  ASSESSMENT / PLAN:  Acute Hypoxic respiratory insufficiency 2/2 R lung consolidation HAP vs aspiration pneumonitis  Continue PRVC 8 cc/kg, wean PEEP from 10 to 5 5/24 CPAP/PS 5/27 8/5 CXR 5/27>> stable RUL pneumonia  Plan: Titrate O2 for sat of 88-92% VAP prevention bundle Pulmonary hygiene Zosyn as below IS per RT protocol Diureses as ordered today  Sepsis with septic shock from peritonitis- resolved  Abd abscess w/ pseudomonas  Colon cancer s/p low anterior resection with ileostomy w/anastomosis leak  UA w/ +Leukocytes & many  bacteria>> UC negative on 5/23 ( Leukocytosis resolving) Probable aspiration pneumonia Wound debridement / dressing change per surgery 5/25/am Plan: Continue zosyn Trend Fever and WBC (WBC continues to down trend 5/27) Follow culture data Abdominal drain in place, surgery following, appreciate input  Acute renal failure from ATN. Continued increase in creatinine overnight to 1.98 on 5/27 Hypernatremia Plan:   Diuresis as he can tolerate, hematin [place] instability has precluded this recently Avoid nephrotoxins Replete electrolytes as indicated D/C free water Lasix 40 mg IV q8 x2 doses D/C LR  Anemia HGB 7.4 >> ( ? if hemo dilutional) Plan Follow CBC Transfusion threshold hemoglobin < 7 Monitor for any obvious bleeding  GI Ilesotomy/ JP/ Open abdominal wound Hypoalbuminemia>> less than 1 on 5/27 TNA stopped 5/24 TF started 5/24>> vomited large amount 5/25 am Bicarb down trending on CMET, and ABG ? High volume loss from illeostomy Plan: D/C TF SLP Albumin <1 Monitor ileostomy for high output>> Creatinine increase>> decrease in bicarb Consider immodium/ lomotil per Surgery if needed, defer to surgery    DM type II. Hypoglycemia in  setting of sepsis- resolved now hyperglycemic  ICU hyperglycemia protocol CBG's Sliding scale insulin  Acute metabolic encephalopathy 2nd to sepsis- Slowly improving RASS goal 0 to -1 Still requiring precedex for agitation and WOB D/C benzos PRN fentanyl pain  Hypertension>>resolved, now hypotensive likely in setting of vent/sedation Bradycardia>>likely in setting or precedex gtt Norepinephrine off since 5/24 Metoprolol on hold Monitor QTc Minimize sedation >> likely contributing to his hypotension/need for pressors  Hold in the ICU for agitation and WOB, monitor airway protection closely.  The patient is critically ill with multiple organ systems failure and requires high complexity decision making for assessment and support,  frequent evaluation and titration of therapies, application of advanced monitoring technologies and extensive interpretation of multiple databases.   Critical Care Time devoted to patient care services described in this note is  32  Minutes. This time reflects time of care of this signee Dr Jennet Maduro. This critical care time does not reflect procedure time, or teaching time or supervisory time of PA/NP/Med student/Med Resident etc but could involve care discussion time.  Rush Farmer, M.D. [facility] Pulmonary/Critical Care Medicine. Pager: [phone]. After hours pager: [phone].

## 2017-06-08 NOTE — Progress Notes (Signed)
Subjective:  More alert and awake the extubated himself earlier today. Maintaining sats above 95% on nasal cannula  Objective:  Vital Signs in the last 24 hours: Temp:  [98.1 F (36.7 C)-100 F (37.8 C)] 98.4 F (36.9 C) (05/29 0900) Pulse Rate:  [55-98] 73 (05/29 0900) Resp:  [21-23] 23 (05/28 1540) BP: (67-142)/(47-82) 120/82 (05/29 0900) SpO2:  [98 %-100 %] 99 % (05/29 0900) Arterial Line BP: (113-152)/(31-55) 144/50 (05/29 0900) FiO2 (%):  [40 %] 40 % (05/28 1540) Weight:  [116.5 kg (256 lb 13.4 oz)] 116.5 kg (256 lb 13.4 oz) (05/29 0500)  Intake/Output from previous day: 05/28 0701 - 05/29 0700 In: 2452.1 [I.V.:723; NG/GT:1569.1; IV Piggyback:150] Out: 3419 [Urine:4880; Drains:160; Stool:1550] Intake/Output from this shift: Total I/O In: -  Out: 350 [Urine:250; Stool:100]  Physical Exam: Neck: no adenopathy, no carotid bruit, no JVD and supple, symmetrical, trachea midline Lungs: Clear anteriorly Heart: regular rate and rhythm, S1, S2 normal and Soft systolic murmur noted  Abdomen: Distended abdominal binder noted Extremities: No clubbing cyanosis generalized edema noted  Lab Results: Recent Labs    06/07/17 0406 06/08/17 0429  WBC 4.8 6.2  HGB 7.9* 7.8*  PLT 142* 131*   Recent Labs    06/07/17 0406 06/08/17 0429  NA 149* 148*  K 4.2 3.7  CL 121* 119*  CO2 21* 22  GLUCOSE 162* 205*  BUN 45* 39*  CREATININE 1.79* 1.71*   No results for input(s): TROPONINI in the last 72 hours.  Invalid input(s): CK, MB Hepatic Function Panel Recent Labs    06/06/17 0450  PROT 5.5*  ALBUMIN <1.0*  AST 38  ALT 30  ALKPHOS 143*  BILITOT 1.1   No results for input(s): CHOL in the last 72 hours. No results for input(s): PROTIME in the last 72 hours.  Imaging: Imaging results have been reviewed and Dg Chest Port 1 View  Result Date: 06/08/2017 CLINICAL DATA:  72 year old male status post low anterior resection the 1st week of this month for primary rectosigmoid  adenocarcinoma. Postop anastomotic leak, wound dehiscence. Extubated last night. EXAM: PORTABLE CHEST 1 VIEW COMPARISON:  06/07/2017 and earlier. FINDINGS: Portable AP semi upright view at 0541 hours. Endotracheal tube removed. Enteric feeding tube remains in place. Stable right IJ central line. Stable lung volumes. Continued streaky opacity in the right upper lung including along the upper lobe border of the minor fissure, stable since 06/06/2017. Mildly increased veiling bilateral lung base opacity. No pneumothorax. The left mid and upper lung remain clear. Thoracoabdominal aortic endograft re-demonstrated. Stable cardiac size and mediastinal contours. IMPRESSION: 1. Extubated.  Otherwise stable lines and tubes. 2. Continued streaky right upper lung opacity could reflect atelectasis or infection. 3. Probable small bilateral pleural effusions. Electronically Signed   By: Genevie Ann M.D.   On: 06/08/2017 08:59   Dg Chest Port 1 View  Result Date: 06/07/2017 CLINICAL DATA:  Intubation.  Respiratory failure. EXAM: PORTABLE CHEST 1 VIEW COMPARISON:  06/06/2017. FINDINGS: Endotracheal tube, feeding tube, right IJ line stable position. Heart size normal. Aortic stent graft in stable position. Persistent right upper lobe and left lower lobe infiltrates. Persistent bibasilar atelectasis. Tiny left pleural effusion cannot be excluded. No pneumothorax. IMPRESSION: 1. Lines and tubes in stable position. Aortic stent graft in stable position. 2. Persistent right upper lobe and left lower lobe infiltrate. Persistent bibasilar atelectasis. Tiny left pleural effusion cannot be excluded. Chest is unchanged from prior exam. Electronically Signed   By: [place]   On: 06/07/2017  07:44    Cardiac Studies:  Assessment/Plan:  Rectosigmoid adenocarcinoma status post resection with primary colorectal anastomosis and  diverting  loop ileostomy and umbilical hernia repair Resolving aspiration pneumonia Spontaneous  retroperitoneal hematoma Abdominalsurgical wound infectionwith bowel exposed. 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

## 2017-06-09 ENCOUNTER — Inpatient Hospital Stay: Payer: Self-pay

## 2017-06-09 DIAGNOSIS — L8990 Pressure ulcer of unspecified site, unspecified stage: Secondary | ICD-10-CM

## 2017-06-09 DIAGNOSIS — I10 Essential (primary) hypertension: Secondary | ICD-10-CM

## 2017-06-09 DIAGNOSIS — R0902 Hypoxemia: Secondary | ICD-10-CM

## 2017-06-09 LAB — GLUCOSE, CAPILLARY
GLUCOSE-CAPILLARY: 140 mg/dL — AB (ref 65–99)
Glucose-Capillary: 196 mg/dL — ABNORMAL HIGH (ref 65–99)
Glucose-Capillary: 166 mg/dL — ABNORMAL HIGH (ref 65–99)
Glucose-Capillary: 121 mg/dL — ABNORMAL HIGH (ref 65–99)

## 2017-06-09 LAB — CBC
HCT: 25.5 % — ABNORMAL LOW (ref 39.0–52.0)
HEMATOCRIT: 28.1 % — AB (ref 39.0–52.0)
HEMOGLOBIN: 8.4 g/dL — AB (ref 13.0–17.0)
Hemoglobin: 7.8 g/dL — ABNORMAL LOW (ref 13.0–17.0)
MCH: 26.2 pg (ref 26.0–34.0)
MCH: 26.3 pg (ref 26.0–34.0)
MCHC: 29.9 g/dL — ABNORMAL LOW (ref 30.0–36.0)
MCHC: 30.6 g/dL (ref 30.0–36.0)
MCV: 85.9 fL (ref 78.0–100.0)
MCV: 87.5 fL (ref 78.0–100.0)
PLATELETS: 137 10*3/uL — AB (ref 150–400)
Platelets: 146 10*3/uL — ABNORMAL LOW (ref 150–400)
RBC: 2.97 MIL/uL — ABNORMAL LOW (ref 4.22–5.81)
RBC: 3.21 MIL/uL — AB (ref 4.22–5.81)
RDW: 25.7 % — AB (ref 11.5–15.5)
RDW: 25.9 % — ABNORMAL HIGH (ref 11.5–15.5)
WBC: 6.6 10*3/uL (ref 4.0–10.5)
WBC: 6.8 10*3/uL (ref 4.0–10.5)

## 2017-06-09 LAB — MAGNESIUM: MAGNESIUM: 1.8 mg/dL (ref 1.7–2.4)

## 2017-06-09 LAB — BASIC METABOLIC PANEL
Anion gap: 9 (ref 5–15)
BUN: 34 mg/dL — ABNORMAL HIGH (ref 6–20)
CALCIUM: 7.7 mg/dL — AB (ref 8.9–10.3)
CO2: 22 mmol/L (ref 22–32)
CREATININE: 1.59 mg/dL — AB (ref 0.61–1.24)
Chloride: 116 mmol/L — ABNORMAL HIGH (ref 101–111)
GFR calc non Af Amer: 42 mL/min — ABNORMAL LOW (ref >60)
GFR, EST AFRICAN AMERICAN: 48 mL/min — AB (ref >60)
Glucose, Bld: 217 mg/dL — ABNORMAL HIGH (ref 65–99)
Potassium: 3.4 mmol/L — ABNORMAL LOW (ref 3.5–5.1)
SODIUM: 147 mmol/L — AB (ref 135–145)

## 2017-06-09 LAB — PHOSPHORUS: Phosphorus: 2.5 mg/dL (ref 2.5–4.6)

## 2017-06-09 MED ORDER — DILTIAZEM HCL-DEXTROSE 100-5 MG/100ML-% IV SOLN (PREMIX)
5.0000 mg/h | INTRAVENOUS | Status: DC
  Administered 2017-06-09: 5 mg/h via INTRAVENOUS
  Administered 2017-06-10 – 2017-06-11 (×6): 15 mg/h via INTRAVENOUS
  Administered 2017-06-12: 10 mg/h via INTRAVENOUS
  Administered 2017-06-12 – 2017-06-15 (×4): 5 mg/h via INTRAVENOUS
  Filled 2017-06-09 (×13): qty 100

## 2017-06-09 MED ORDER — POTASSIUM CHLORIDE 20 MEQ/15ML (10%) PO SOLN
40.0000 meq | Freq: Once | ORAL | Status: AC
  Administered 2017-06-09: 40 meq
  Filled 2017-06-09: qty 30

## 2017-06-09 MED ORDER — QUETIAPINE FUMARATE 25 MG PO TABS
25.0000 mg | ORAL_TABLET | Freq: Two times a day (BID) | ORAL | Status: DC
  Administered 2017-06-09 – 2017-06-15 (×7): 25 mg via ORAL
  Filled 2017-06-09 (×7): qty 1

## 2017-06-09 MED ORDER — VITAL 1.5 CAL PO LIQD
1000.0000 mL | ORAL | Status: DC
  Administered 2017-06-11: 1000 mL
  Filled 2017-06-09 (×9): qty 1000

## 2017-06-09 MED ORDER — PRO-STAT SUGAR FREE PO LIQD
30.0000 mL | Freq: Two times a day (BID) | ORAL | Status: DC
  Administered 2017-06-11 – 2017-06-15 (×7): 30 mL
  Filled 2017-06-09 (×6): qty 30

## 2017-06-09 MED ORDER — CHLORHEXIDINE GLUCONATE CLOTH 2 % EX PADS
6.0000 | MEDICATED_PAD | Freq: Every day | CUTANEOUS | Status: DC
  Administered 2017-06-10 – 2017-06-15 (×6): 6 via TOPICAL

## 2017-06-09 MED ORDER — CHLORHEXIDINE GLUCONATE 0.12 % MT SOLN
15.0000 mL | Freq: Two times a day (BID) | OROMUCOSAL | Status: DC
  Administered 2017-06-09 – 2017-06-21 (×19): 15 mL via OROMUCOSAL
  Filled 2017-06-09 (×16): qty 15

## 2017-06-09 MED ORDER — ORAL CARE MOUTH RINSE
15.0000 mL | Freq: Two times a day (BID) | OROMUCOSAL | Status: DC
  Administered 2017-06-09 – 2017-06-21 (×19): 15 mL via OROMUCOSAL

## 2017-06-09 MED ORDER — FUROSEMIDE 10 MG/ML IJ SOLN: 40.0000 mg | Freq: Two times a day (BID) | INTRAMUSCULAR | Status: DC

## 2017-06-09 MED ORDER — SODIUM CHLORIDE 0.9% FLUSH
10.0000 mL | INTRAVENOUS | Status: DC | PRN
  Administered 2017-06-20 – 2017-06-21 (×2): 20 mL
  Filled 2017-06-09 (×2): qty 40

## 2017-06-09 MED ORDER — GERHARDT'S BUTT CREAM
TOPICAL_CREAM | CUTANEOUS | Status: DC | PRN
  Administered 2017-06-11: 22:00:00 via TOPICAL
  Filled 2017-06-09: qty 1

## 2017-06-09 MED ORDER — SODIUM CHLORIDE 0.9% FLUSH
10.0000 mL | Freq: Two times a day (BID) | INTRAVENOUS | Status: DC
  Administered 2017-06-10 – 2017-06-13 (×3): 10 mL
  Administered 2017-06-14: 20 mL
  Administered 2017-06-15 – 2017-06-21 (×10): 10 mL

## 2017-06-09 MED ORDER — FENTANYL CITRATE (PF) 100 MCG/2ML IJ SOLN
50.0000 ug | INTRAMUSCULAR | Status: DC | PRN
  Administered 2017-06-09 – 2017-06-13 (×9): 50 ug via INTRAVENOUS
  Filled 2017-06-09 (×10): qty 2

## 2017-06-09 MED ORDER — FENTANYL CITRATE (PF) 100 MCG/2ML IJ SOLN: 50.0000 ug | Freq: Four times a day (QID) | INTRAMUSCULAR | Status: DC | PRN

## 2017-06-09 MED ORDER — ALBUMIN HUMAN 25 % IV SOLN
25.0000 g | Freq: Four times a day (QID) | INTRAVENOUS | Status: DC
  Administered 2017-06-09 – 2017-06-11 (×10): 25 g via INTRAVENOUS
  Filled 2017-06-09: qty 100
  Filled 2017-06-09: qty 50
  Filled 2017-06-09 (×5): qty 100
  Filled 2017-06-09: qty 50
  Filled 2017-06-09 (×3): qty 100

## 2017-06-09 MED ORDER — FUROSEMIDE 10 MG/ML IJ SOLN
40.0000 mg | Freq: Four times a day (QID) | INTRAMUSCULAR | Status: AC
  Administered 2017-06-09 (×3): 40 mg via INTRAVENOUS
  Filled 2017-06-09 (×3): qty 4

## 2017-06-09 NOTE — Progress Notes (Addendum)
PULMONARY / CRITICAL CARE MEDICINE   Name: Jesse Le MRN: [id number] DOB: Jul 12, 1945    ADMISSION DATE:  05/06/2017 CONSULTATION DATE:  05/18/2017  REFERRING MD:  Dr. Terrence Dupont  CHIEF COMPLAINT:  Hypotension  HISTORY OF PRESENT ILLNESS:   72 yo male presented with abdominal pain, weight loss and intermittent hematochezia.  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  Had low anterior resection with primary anastomosis with diverting loop ileostomy.  Developed hypotension, hypoglycemia 5/07.  Developed hypotension and altered mental status 5/08 and transferred to ICU. Pt has an anastomotic leak, drain placed by surgery PMHx of CAD, DM, TAA.  5/22 Re-consult for progressive hypoxia throughout the night and ongoing confusion, ~70's on RA, placed on NRB then to BiPAP.  CXR- rotated film worsening R multifocal consolidation, small pleural effusions.  Net +21 L.  Hypertensive, afebrile x 48 hours>> Intubated.  Self extubated 5/28 and remained on precedex for ongoing agitation till 5/30.  Subjective: Off of precedex this am Remains off pressors  Net I/O -275 ml, However, wt is still 41 lbs since 4/26 RN questions if TF is coming out of RUQ Jp drain  VITAL SIGNS: BP (!) 105/54   Pulse 73   Temp 100.2 F (37.9 C) (Core)   Resp (!) 23   Ht 5\' 10"  (1.778 m)   Wt 249 lb 1.9 oz (113 kg)   SpO2 100%   BMI 35.74 kg/m   INTAKE / OUTPUT: I/O last 3 completed shifts: In: 4187 [I.V.:827; Other:10; NG/GT:2900; IV Piggyback:450] Out: 61607 [Urine:7275; Drains:306; Stool:2500]  PHYSICAL EXAMINATION: General:  Chronically ill elderly male lying in bed in NAD HEENT: MM pink/dry, left nare cortrak, pupils equal/reactive, poor denition Neuro: Awake, calm, pleasantly confused, f/c, MAE, generalized weakness CV: rrr, no m/r/g PULM: even/non-labored, lungs bilaterally diminished/ faint bibasilar rales  GI: wound vac to lower abd, RUQ JP with white milky drainage, RLQ  ileostomy  Extremities: warm/dry, anasarca/ +4 pedal edema Skin: no rashes   LABS:  BMET Recent Labs  Lab 06/07/17 0406 06/08/17 0429 06/09/17 0442  NA 149* 148* 147*  K 4.2 3.7 3.4*  CL 121* 119* 116*  CO2 21* 22 22  BUN 45* 39* 34*  CREATININE 1.79* 1.71* 1.59*  GLUCOSE 162* 205* 217*   Electrolytes Recent Labs  Lab 06/03/17 0435 06/04/17 0345  06/07/17 0406 06/08/17 0429 06/09/17 0442  CALCIUM 7.5* 7.5*   < > 7.7* 7.7* 7.7*  MG 2.4 2.5*  --   --  1.8 1.8  PHOS 3.3  --   --   --  3.4 2.5   < > = values in this interval not displayed.   CBC Recent Labs  Lab 06/07/17 0406 06/08/17 0429 06/09/17 0442  WBC 4.8 6.2 6.6  HGB 7.9* 7.8* 7.8*  HCT 26.3* 25.9* 25.5*  PLT 142* 131* 137*   Coag's No results for input(s): APTT, INR in the last 168 hours.  Sepsis Markers No results for input(s): LATICACIDVEN, PROCALCITON, O2SATVEN in the last 168 hours.  ABG Recent Labs  Lab 06/06/17 0435 06/06/17 0735 06/08/17 0435  PHART 7.389 7.425 7.378  PCO2ART 29.8* 29.3* 35.4  PO2ART 100 73.0* 95.0   Liver Enzymes Recent Labs  Lab 06/05/17 0326 06/06/17 0450  AST 41 38  ALT 31 30  ALKPHOS 155* 143*  BILITOT 1.3* 1.1  ALBUMIN 1.1* <1.0*   Cardiac Enzymes No results for input(s): TROPONINI, PROBNP in the last 168 hours.  Glucose Recent Labs  Lab  06/08/17 1229 06/08/17 1716 06/08/17 2014 06/08/17 2333 06/09/17 0357 06/09/17 0821  GLUCAP 177* 127* 162* 178* 196* 166*   Imaging [place] Ekg Site Rite  Result Date: 06/09/2017 If Site Rite image not attached, placement could not be confirmed due to current cardiac rhythm.  STUDIES:  CT abd 5/09 >> discontinuity in the posterior wall just above the anastomosis with feculent appearing material and gas in the pelvis, measuring up to 5x 3 cm anterior and superior to the sigmoid colon.  Fluid collection right lower quadrant measuring up to 6.4 x 3.5 cm. Moderate pneumoperitoneum.  CULTURES: MRSA PCR 5/8 >>  neg Blood culture 5/8 >> neg Abd ascess 5/16 >> pseudomonas > pan sensitive Blood culture 5/22>> neg Urine culture 5/22>> neg Bronchial alveolar lavage 5/22>> Candida Glabrata>> suspect contaminent  ANTIBIOTICS: Zosyn 5/7 > 5/8 Zosyn 5/10>> Unasyn 5/8 > 5/10 Vancomycin 5/7 >5/20 Eraxis 5/8 > 5/18  LINES/TUBES: Lt IJ CVL  5/8 > 5/10 JP Drain 5/1> RUE PICC 5/10 >>5/23 Right IJ CVC 5/22>> ETT 5/22>> 5/28 Foley 5/24 >> L nare cortrak 5/22 >>  DISCUSSION: 72 yo male with sepsis from peritonitis after surgery for colon cancer and CT abd +  for anastomotic leak.>> Drain placed 5/1.  Pt with acute hypoxic respiratory failure on 5/22 requiring intubation/ concern for aspiration.  Self extubated 5/28.  Residual delirium on precedex   ASSESSMENT / PLAN:  Acute Hypoxic respiratory insufficiency 2/2 R lung consolidation HAP/ RUL vs aspiration pneumonitis  Plan: Titrate O2 for sat of 88-92% Aggressive pulm hygiene, IS, PT therapy See ID below Diuresis as below  Sepsis with septic shock from peritonitis- resolved  Abd abscess w/ pseudomonas  Colon cancer s/p low anterior resection with ileostomy w/anastomosis leak  UA w/ +Leukocytes & many bacteria>> UC negative on 5/23 ( Leukocytosis resolving) Probable aspiration pneumonia Wound debridement / dressing change per surgery 5/25/am Plan: Continue zosyn Trend Fever and WBC Follow culture data Abdominal drain in place, surgery following, appreciate input PMT consult to establish further long term GOC  Acute renal failure from ATN. Continued increase in creatinine overnight to 1.98 on 5/27 Hypernatremia Plan:   Lasix 40 mg x 2 with albumin  Free water 226ml q 6  Trend BMP /phos/ mag/ daily wts/ urinary output Replace electrolytes as indicated Avoid nephrotoxic agents, ensure adequate renal perfusion  Anemia- stable Thrombocytopenia- stable Plan Follow CBC Transfusion threshold hemoglobin < 7  GI Ilesotomy/ JP/ Open  abdominal wound Hypoalbuminemia>> less than 1 on 5/27 TNA stopped 5/24 TF started 5/24>> vomited large amount 5/25 am, restarted 5/26 and tolerating Plan: Appreciate SLP assistance  Trend albumin  Monitor ileostomy for high output, defer to surgery   DM type II. ICU hyperglycemia protocol CBG's Sliding scale insulin moderate  Acute metabolic encephalopathy 2nd to sepsis ICU delirium - Slowly improving RASS goal 0  precedex off as of this am 5/30 Add seroquel, QTc today 0.47  Minimize PRN fentanyl, prn q 6 hr  Hypertension>>resolved, now hypotensive likely in setting of vent/sedation Bradycardia>>resolved Metoprolol on hold Monitor QTc   Transfer to SDU.  Patient remains hemodynamically stable.  Nothing further to add.  PCCM will sign off.  Please do not hesitate to call us back if we can be of any further assistance.  Kennieth Rad, AGACNP-BC [place] [facility] Pgr: [phone] or if no answer [phone] 06/09/2017, 9:58 AM  Attending Note:  72 year old male with extensive PMH who presents with respiratory failure post abdominal surgery.  Abdomen  remains open at this point.  On exam, lungs are clear.  I reviewed CXR myself, pulmonary edema noted.  Discussed with PCCM-NP.  Respiratory failure:  - Monitor closely for airway protection  - SLP  Hypoxemia:   - Titrate O2 for sat of 88-92%.  Delirium  - Seroquel since QTc ios .49  - Monitor closely  Hypertension:  - Start PO medications when able to take PO  - IV lopressor scheduled for now  Transfer to SDU.  SLP ordered.  PCCM will sign off, please call back if needed.  Patient seen and examined, agree with above note.  I dictated the care and orders written for this patient under my direction.  Rush Farmer, [place]

## 2017-06-09 NOTE — Plan of Care (Signed)
Problem: Elimination: Goal: Will not experience complications related to urinary retention Outcome: Progressing  Urinary output remains WNL . Pt with good responsive to Lasix. Problem: Activity: Goal: Ability to tolerate increased activity will improve 06/09/2017 0347 by Kyann Heydt, Lendon Ka, RN Outcome: Not Progressing Pt tolerating current activity mobility not increaseing Problem: Respiratory: Goal: Ability to maintain a clear airway and adequate ventilation will improve Outcome: Progressing  Pt extubated 5/28 and maintaining O2 SATS on RA

## 2017-06-09 NOTE — Evaluation (Deleted)
Physical Therapy Evaluation Patient Details Name: Jesse Le MRN: [id number] DOB: 31-Aug-1945 Today's Date: 06/09/2017   History of Present Illness  Pt is a 72 y.o. 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  He developed sepsis with septic shock from peritonitis. PMH includes HTN, DM, COPD, aortic aneurysm, CKD II, obesity.      Clinical Impression  Patient not progressing with therapy this session. Mod A x 2 for bed mobility, tolerating sitting EOB for 10 minutes this visit. Unable to stand inside [place] with max A x2, a decline from last PT session 5/17 where patient was Min A x2 to stand and tolerated standing for 10 minutes. Pt following commands with increased time, overall more lethargic. Pt became tachy when attempting to stand, HR=140.   Vitals:  Start of session: 126/61 HR 109  SpO2 97% on RA End of Session: 147/75  HR 130  SpO2 95% on RA    Follow Up Recommendations SNF;Supervision/Assistance - 24 hour    Equipment Recommendations       Recommendations for Other Services       Precautions / Restrictions Precautions Precautions: Fall Precaution Comments: ileostomy bag, open abdominal wound; JP drain.  Wound vac      Mobility  Bed Mobility Overal bed mobility: Needs Assistance Bed Mobility: Rolling;Sidelying to Sit Rolling: Mod assist;+2 for safety/equipment;+2 for physical assistance Sidelying to sit: Mod assist;+2 for physical assistance          Transfers Overall transfer level: Needs assistance Equipment used: Ambulation equipment used Transfers: Sit to/from Stand Sit to Stand: Max assist;+2 physical assistance         General transfer comment: Patient unable to tolerate standing today, too weak and became tachy. asked to return to bed   Ambulation/Gait                Stairs            Wheelchair Mobility     Modified Rankin (Stroke Patients Only)       Balance Overall balance assessment: Needs assistance Sitting-balance support: Feet supported Sitting balance-[name]: Fair     Standing balance support: Bilateral upper extremity supported Standing balance-[name]: Zero                               Pertinent Vitals/Pain Pain Assessment: Faces Faces Pain Scale: Hurts whole lot    Home Living                        Prior Function                 Hand Dominance        Extremity/Trunk Assessment                Communication      Cognition Arousal/Alertness: Awake/alert;Lethargic Behavior During Therapy: Flat affect Overall Cognitive Status: Impaired/Different from baseline Area of Impairment: Orientation;Attention;Following commands                 Orientation Level: Disoriented to;Place;Time;Situation Current Attention Level: Focused   Following Commands: Follows one step commands consistently;Follows one step commands with increased time Safety/Judgement: Decreased awareness of safety;Decreased awareness of deficits Awareness: Emergent Problem Solving: Slow processing;Difficulty sequencing;Requires verbal cues;Requires tactile cues        General Comments  Exercises     Assessment/Plan    PT Assessment    PT Problem List         PT Treatment Interventions      PT Goals (Current goals can be found in the Care Plan section)  Acute Rehab PT Goals Patient Stated Goal: non stated PT Goal Formulation: With patient Time For Goal Achievement: 06/17/17 Potential to Achieve Goals: Fair    Frequency Min 2X/week   Barriers to discharge        Co-evaluation               AM-PAC PT "6 Clicks" Daily Activity  Outcome Measure Difficulty turning over in bed (including adjusting bedclothes, sheets and blankets)?: Unable Difficulty moving from lying on back to sitting on the side of the bed? :  Unable Difficulty sitting down on and standing up from a chair with arms (e.g., wheelchair, bedside commode, etc,.)?: Unable Help needed moving to and from a bed to chair (including a wheelchair)?: A Lot Help needed walking in hospital room?: Total Help needed climbing 3-5 steps with a railing? : Total 6 Click Score: 7    End of Session Equipment Utilized During Treatment: Gait belt Activity Tolerance: Patient limited by fatigue Patient left: in chair;with chair alarm set;with nursing/sitter in room Nurse Communication: Mobility status PT Visit Diagnosis: Other abnormalities of gait and mobility (R26.89)    Time: 5009-3818 PT Time Calculation (min) (ACUTE ONLY): 38 min   Charges:     PT Treatments $Therapeutic Activity: 8-22 mins   PT G Codes:       Reinaldo Berber, PT, DPT Acute Rehab Services Pager: [phone]   Reinaldo Berber 06/09/2017, 4:17 PM

## 2017-06-09 NOTE — Progress Notes (Signed)
eLink Physician-Brief Progress Note Patient Name: Jesse Le DOB: 08/02/1945 MRN: [id number]   Date of Service  06/09/2017  HPI/Events of Note  A fib with RVR.  BP okay.    eICU Interventions  Will start cardizem gtt.  Check electrolytes, Hb.        Ahri Olson 06/09/2017, 10:09 PM

## 2017-06-09 NOTE — Evaluation (Signed)
Clinical/Bedside Swallow Evaluation Patient Details  Name: Jesse Le MRN: [id number] Date of Birth: [birth year]  Today's Date: 06/09/2017 Time: SLP Start Time (ACUTE ONLY): 0902 SLP Stop Time (ACUTE ONLY): 0919 SLP Time Calculation (min) (ACUTE ONLY): 17 min  Past Medical History:  Past Medical History:  Diagnosis Date  . Aneurysm of aorta (HCC)   . Coronary artery disease   . Diabetes mellitus without complication ([place])    NEW ONSET 09/2012  . Hypertension   . Myocardial infarct [facility])    Past Surgical History:  Past Surgical History:  Procedure Laterality Date  . ABDOMINAL AORTIC ANEURYSM REPAIR  10-14-11   TEVAR  . COLON RESECTION SIGMOID N/A 05/11/2017   Procedure: Colon Resection Sigmoid Low Anterior Resection with Staple Colon Relast;  Surgeon: Donnie Mesa, MD;  Location: [place];  Service: General;  Laterality: N/A;  . COLONOSCOPY WITH PROPOFOL N/A 05/08/2017   Procedure: COLONOSCOPY WITH PROPOFOL;  Surgeon: Jerene Bears, MD;  Location: [place];  Service: Gastroenterology;  Laterality: N/A;  . ILEOSTOMY N/A 05/11/2017   Procedure: Creation Vertical Loop ILEOSTOMY;  Surgeon: Donnie Mesa, MD;  Location: [place];  Service: General;  Laterality: N/A;  . INTRAVASCULAR ULTRASOUND  10/14/2011   Procedure: INTRAVASCULAR ULTRASOUND;  Surgeon: Serafina Mitchell, MD;  Location: [place];  Service: Vascular;  Laterality: N/A;  . UMBILICAL HERNIA REPAIR N/A 05/11/2017   Procedure: HERNIA REPAIR UMBILICAL ADULT;  Surgeon: Donnie Mesa, MD;  Location: [place];  Service: General;  Laterality: N/A;  . VIDEO BRONCHOSCOPY  10/14/2011   Procedure: VIDEO BRONCHOSCOPY;  Surgeon: Serafina Mitchell, MD;  Location: [facility];  Service: Vascular;  Laterality: N/A;   HPI:  72 yo male presented with abdominal pain, weight loss, intermittent hematochezia and found to have 9 cm colon mass from adenocarcinoma of recto-sigmoid colon.  Underwent low anterior resection with primary anastomosis with diverting loop  ileostomy.  Developed hypotension, hypoglycemia 5/07 and altered mental status 5/08 and transferred to ICU wit septic shock secondary to intraabdominal source (peritonitis vs wound infection). Required intubation 5/22 due to tachycardua and hypoxia. Self extubated 5/28. CXR 5/28 Persistent right upper lobe and left lower lobe infiltrate. Persistent bibasilar atelectasis. Repeat CXR 5/29 Continued streaky right upper lung opacity could reflect atelectasis or infection PMHx of CAD, DM, TAA, MI, HTN. BSE 05/27/17 rec'd D1, thin.   Assessment / Plan / Recommendation Clinical Impression  Pt significantly deconditioned with prolonged hospitalization requiring 7 day intubation and significant buttock pain during assessment due to wound. Copious moderately moist secretions adhered to hard palate removed with aggressive oral care with the aid of suture removal tool. Ice chips given to moisten oral cavity with delayed manipulation and suspected delayed and weak swallow. He refused tsp sips water.  Pt did not cough on command and vocal quality is hoarse. Continue with NGT feeds and intense oral care paying extra attention to hard palate. Continue intervention to focus on oral management of secretions, ability to attend to po's and objective instrumentation when able.   SLP Visit Diagnosis: Dysphagia, unspecified (R13.10)    Aspiration Risk  Moderate aspiration risk    Diet Recommendation NPO   Medication Administration: Via alternative means    Other  Recommendations Oral Care Recommendations: Oral care QID Other Recommendations: Have oral suction available   Follow up Recommendations Skilled Nursing facility      Frequency and Duration min 2x/week  2 weeks       Prognosis Prognosis for Safe Diet Advancement: Good  Swallow Study   General HPI: 72 yo male presented with abdominal pain, weight loss, intermittent hematochezia and found to have 9 cm colon mass from adenocarcinoma of recto-sigmoid  colon.  Underwent low anterior resection with primary anastomosis with diverting loop ileostomy.  Developed hypotension, hypoglycemia 5/07 and altered mental status 5/08 and transferred to ICU wit septic shock secondary to intraabdominal source (peritonitis vs wound infection). Required intubation 5/22 due to tachycardua and hypoxia. Self extubated 5/28. CXR 5/28 Persistent right upper lobe and left lower lobe infiltrate. Persistent bibasilar atelectasis. Repeat CXR 5/29 Continued streaky right upper lung opacity could reflect atelectasis or infection PMHx of CAD, DM, TAA, MI, HTN. BSE 05/27/17 rec'd D1, thin. Type of Study: Bedside Swallow Evaluation Previous Swallow Assessment: (see HPI) Diet Prior to this Study: NPO Temperature Spikes Noted: Yes Respiratory Status: Nasal cannula History of Recent Intubation: Yes Length of Intubations (days): 7 days Date extubated: 06/07/17 Behavior/Cognition: Alert;Requires cueing;Other (Comment)(in pain) Oral Cavity Assessment: Dried secretions;Dry Oral Care Completed by SLP: Yes Oral Cavity - Dentition: Missing dentition;Poor condition Vision: Functional for self-feeding Self-Feeding Abilities: Total assist Patient Positioning: Upright in bed Baseline Vocal Quality: Hoarse Volitional Cough: Cognitively unable to elicit Volitional Swallow: Unable to elicit    Oral/Motor/Sensory Function Overall Oral Motor/Sensory Function: Generalized oral weakness   Ice Chips Ice chips: Impaired Presentation: Spoon Oral Phase Impairments: Reduced lingual movement/coordination Pharyngeal Phase Impairments: Suspected delayed Swallow   Thin Liquid Thin Liquid: Not tested(pt refused tsp sips)    Nectar Thick Nectar Thick Liquid: Not tested   Honey Thick Honey Thick Liquid: Not tested   Puree Puree: Not tested   Solid   GO   Solid: Not tested        [name] 06/09/2017,9:36 AM  [name] Colvin Caroli.Ed [employer] [phone]

## 2017-06-09 NOTE — Progress Notes (Signed)
PT Cancellation Note  Patient Details Name: Ledarius Leeson MRN: [id number] DOB: [birth year]   Cancelled Treatment:     Attempted to work with patient at 45, RN reports pt has vomited on his abdominal binder and they are awaiting a new one.    Reinaldo Berber, PT, DPT Acute Rehab Services Pager: [phone]     Reinaldo Berber 06/09/2017, 2:36 PM

## 2017-06-09 NOTE — Progress Notes (Signed)
29 Days Post-Op    CC:  Abdominal pain and poor appetite    Subjective: Still off the ventilator with good O2 sats on nasal cannula.  He remains confused and disoriented.  He seems to be tolerating the tube feeding well.  The drainage from the JP drain looks very much like the tube feeding.  He has a stage II decubitus ulcer that is being treated. Wound VAC in place.   Objective: Vital signs in last 24 hours: Temp:  [98.2 F (36.8 C)-100 F (37.8 C)] 100 F (37.8 C) (05/30 0600) Pulse Rate:  [69-87] 78 (05/30 0600) BP: (99-132)/(46-83) 112/52 (05/30 0600) SpO2:  [87 %-100 %] 97 % (05/30 0600) Arterial Line BP: (118-163)/(41-55) 140/43 (05/30 0600) Weight:  [113 kg (249 lb 1.9 oz)] 113 kg (249 lb 1.9 oz) (05/30 0600) Last BM Date: 06/07/17 900 IV 2210 NG/TF Urine 4900 Drain 236 Ileostomy 1250 Afebrile, VSS Na 147 K+ 3.4 gtlucose 217   Intake/Output from previous day: 05/29 0701 - 05/30 0700 In: 3094.6 [I.V.:534.6; NG/GT:2210; IV Piggyback:350] Out: 1093 [Urine:4900; Drains:236; Stool:1250] Intake/Output this shift: No intake/output data recorded.  General appearance: alert and Awake, he does speak some but not a lot.  He normally does not make a good deal of sense.  He was uncomfortable with being turned and repositioned and having his binder placed lower on his abdomen.  He remains in mittens. Resp: clear to auscultation bilaterally and Mostly clear few rales in the base. Cardio: regular rate and rhythm, S1, S2 normal, no murmur, click, rub or gallop GI: Wound VAC in place.  JP drain has a white milk-like drainage coming from it.  Ileostomy is working well. Skin: Skin color, texture, turgor normal. No rashes or lesions or He has a stage II decubitus ulcer on his buttocks.  Lab Results:  RecentLabs(last2labs)      Recent Labs    06/08/17 0429 06/09/17 0442  WBC 6.2 6.6  HGB 7.8* 7.8*  HCT 25.9* 25.5*  PLT 131* 137*       BMET RecentLabs(last2labs)  Recent Labs    06/08/17 0429 06/09/17 0442  NA 148* 147*  K 3.7 3.4*  CL 119* 116*  CO2 22 22  GLUCOSE 205* 217*  BUN 39* 34*  CREATININE 1.71* 1.59*  CALCIUM 7.7* 7.7*     PT/INR RecentLabs(last2labs)  No results for input(s): LABPROT, INR in the last 72 hours.    LastLabs      Recent Labs  Lab 06/05/17 0326 06/06/17 0450  AST 41 38  ALT 31 30  ALKPHOS 155* 143*  BILITOT 1.3* 1.1  PROT 5.6* 5.5*  ALBUMIN 1.1* <1.0*       Lipase  Labs(Brief)     Component Value Date/Time   LIPASE 22 05/06/2017 1036       Medications: . chlorhexidine  15 mL Mouth Rinse BID  . Chlorhexidine Gluconate Cloth  6 each Topical Daily  . famotidine  40 mg Per Tube Daily  . feeding supplement (PRO-STAT SUGAR FREE 64)  30 mL Per Tube BID  . feeding supplement (VITAL 1.5 CAL)  1,000 mL Per Tube Q24H  . free water  250 mL Per Tube Q6H  . insulin aspart  0-15 Units Subcutaneous Q4H  . lip balm  1 application Topical BID  . mouth rinse  15 mL Mouth Rinse q12n4p  . sodium chloride flush  5 mL Intracatheter Q8H  . vitamin C  500 mg Per Tube Daily   . dexmedetomidine (PRECEDEX)  IV infusion 0.8 mcg/kg/hr (06/09/17 0629)  . norepinephrine (LEVOPHED) Adult infusion Stopped (06/03/17 1324)  . piperacillin-tazobactam (ZOSYN)  IV 3.375 g (06/09/17 0504)    Assessment/Plan Coronary artery disease. History of myocardial infarction Hypertension Status post thoracic aortic dissection and stent placement  status post bilateral iliac stent placements COPD AKI/CKD II-creatinine 1.79 NIDDM- Chronic anemia-  Obesity- BMI 35 Hypokalemia-resolved Hypernatremia-NA 149 Agitation - Malnutrition/chronicon acutedeconditioning Stage II decubitus   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 1.Low anterior resection with primary colorectal anastomosis, diverting loop ileostomy, rigid proctoscopy,  umbilical hernia repair 05/13/7941 Dr. Donnie Mesa,  POD 29 -Postop anastomotic leak -Wound dehiscence/possible exposed bowel at the base of the wound.  -Wound VAC placement 06/08/2017  Ventilator dependent respiratory failure-failed attempted extubation- self extubated 06/07/17  good saturations on [place]   FEN: IV fluids/Tube feeding/NPO  ID: Cefotetan pre op; Vancomycin 5/8 -05/30/17; Eraxis 5/9 - 05/29/17; Zosyn 5/7 =>>day 22 DVT: SCD Follow up: Dr. Georgette Dover   Plan: He is on tube feedings, a swallow study has been requested.  We will recheck abdominal wound with his wound VAC change tomorrow.     LOS: 34 days    Jesse Le 06/09/[id number]

## 2017-06-09 NOTE — Progress Notes (Signed)
Subjective:  Patient awake denies any abdominal pain. Denies chest pain or shortness of breath. Continues to have large volume JP drain possibly from anastomotic leak/abscess. Discussed with surgery patient is not a candidate for reexploration as per surgery.  Objective:  Vital Signs in the last 24 hours: Temp:  [98.4 F (36.9 C)-100.2 F (37.9 C)] 100.2 F (37.9 C) (05/30 0825) Pulse Rate:  [69-87] 73 (05/30 0700) BP: (99-132)/(46-83) 105/54 (05/30 0700) SpO2:  [87 %-100 %] 100 % (05/30 0700) Arterial Line BP: (118-163)/(41-55) 137/42 (05/30 0700) Weight:  [113 kg (249 lb 1.9 oz)] 113 kg (249 lb 1.9 oz) (05/30 0600)  Intake/Output from previous day: 05/29 0701 - 05/30 0700 In: 3094.6 [I.V.:534.6; NG/GT:2210; IV Piggyback:350] Out: 0240 [Urine:4900; Drains:236; Stool:1250] Intake/Output from this shift: No intake/output data recorded.  Physical Exam: Neck: no adenopathy, no carotid bruit, no JVD and supple, symmetrical, trachea midline Lungs: Clear anteriorly Heart: regular rate and rhythm, S1, S2 normal and Soft systolic murmur noted  Abdomen: Distended abdominal binder noted, JP bulb yellow fluid noted Extremities: No clubbing cyanosis and generalized edema noted   Lab Results: Recent Labs    06/08/17 0429 06/09/17 0442  WBC 6.2 6.6  HGB 7.8* 7.8*  PLT 131* 137*   Recent Labs    06/08/17 0429 06/09/17 0442  NA 148* 147*  K 3.7 3.4*  CL 119* 116*  CO2 22 22  GLUCOSE 205* 217*  BUN 39* 34*  CREATININE 1.71* 1.59*   No results for input(s): TROPONINI in the last 72 hours.  Invalid input(s): CK, MB Hepatic Function Panel No results for input(s): PROT, ALBUMIN, AST, ALT, ALKPHOS, BILITOT, BILIDIR, IBILI in the last 72 hours. No results for input(s): CHOL in the last 72 hours. No results for input(s): PROTIME in the last 72 hours.  Imaging: Imaging results have been reviewed and Dg Chest Port 1 View  Result Date: 06/08/2017 CLINICAL DATA:  72 year old male  status post low anterior resection the 1st week of this month for primary rectosigmoid adenocarcinoma. Postop anastomotic leak, wound dehiscence. Extubated last night. EXAM: PORTABLE CHEST 1 VIEW COMPARISON:  06/07/2017 and earlier. FINDINGS: Portable AP semi upright view at 0541 hours. Endotracheal tube removed. Enteric feeding tube remains in place. Stable right IJ central line. Stable lung volumes. Continued streaky opacity in the right upper lung including along the upper lobe border of the minor fissure, stable since 06/06/2017. Mildly increased veiling bilateral lung base opacity. No pneumothorax. The left mid and upper lung remain clear. Thoracoabdominal aortic endograft re-demonstrated. Stable cardiac size and mediastinal contours. IMPRESSION: 1. Extubated.  Otherwise stable lines and tubes. 2. Continued streaky right upper lung opacity could reflect atelectasis or infection. 3. Probable small bilateral pleural effusions. Electronically Signed   By: Genevie Ann M.D.   On: 06/08/2017 08:59   [place] Ekg Site Rite  Result Date: 06/09/2017 If Site Rite image not attached, placement could not be confirmed due to current cardiac rhythm.   Cardiac Studies:  Assessment/Plan:  Rectosigmoid adenocarcinoma status post resection with primary colorectal anastomosis anddiverting loop ileostomy and umbilical hernia repair Resolving aspiration pneumonia Spontaneous retroperitoneal hematoma Abdominalsurgical wound infectionwith bowel exposed. Metabolic encephalopathy Status postSeptichypotensive shock secondary Intra-abdominal sepsis/possible abscess/anastomosis leak Acute on chronic renal injury.improved Status post nonsustained SVT Acute on chronic hypochromic microcytic anemiastable History of thoracic aneurysm dissection status post endograftandinfrarenal dissection status post fenestrated endovascular graft Hypertension Diabetes mellitus COPD Obesity Hypoalbuminemia Plan Continue present  management per surgery/CCM Long-term prognosis poor Agree with CCM regarding palliative  care consult    LOS: 34 days    Charolette Forward 06/09/2017, 9:17 AM

## 2017-06-09 NOTE — Progress Notes (Signed)
Orthopedic Tech Progress Note Patient Details:  Jesse Le 1945-11-11 [id number]  Ortho Devices Type of Ortho Device: Abdominal binder Ortho Device/Splint Location: abdomen Ortho Device/Splint Interventions: Ordered  viewed order from doctor's order list     Hildred Priest 06/09/2017, 2:52 PM

## 2017-06-09 NOTE — Progress Notes (Signed)
Physical Therapy Treatment Patient Details Name: Jesse Le MRN: [id number] DOB: 1945/03/12 Today's Date: 06/09/2017    History of Present Illness Pt is a 72 y.o. 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  He developed sepsis with septic shock from peritonitis. PMH includes HTN, DM, COPD, aortic aneurysm, CKD II, obesity.     PT Comments      Patient not progressing with therapy this session. Mod A x 2 for bed mobility, tolerating sitting EOB for 10 minutes this visit. Unable to stand inside [place] with max A x2, a decline from last PT session 5/17 where patient was Min A x2 to stand and tolerated standing for 10 minutes. Pt following commands with increased time, overall more lethargic. Pt became tachy when attempting to stand, HR=140.   Vitals:  Start of session: 126/61 HR 109  SpO2 97% on RA End of Session: 147/75  HR 130  SpO2 95% on RA        Follow Up Recommendations  SNF;Supervision/Assistance - 24 hour     Equipment Recommendations       Recommendations for Other Services       Precautions / Restrictions Precautions Precautions: Fall Precaution Comments: ileostomy bag, open abdominal wound; JP drain.  Wound vac    Mobility  Bed Mobility Overal bed mobility: Needs Assistance Bed Mobility: Rolling;Sidelying to Sit Rolling: Mod assist;+2 for safety/equipment;+2 for physical assistance Sidelying to sit: Mod assist;+2 for physical assistance          Transfers Overall transfer level: Needs assistance Equipment used: Ambulation equipment used Transfers: Sit to/from Stand Sit to Stand: Max assist;+2 physical assistance         General transfer comment: Patient unable to tolerate standing today, too weak and became tachy. asked to return to bed   Ambulation/Gait                 Stairs             Wheelchair  Mobility    Modified Rankin (Stroke Patients Only)       Balance Overall balance assessment: Needs assistance Sitting-balance support: Feet supported Sitting balance-[name]: Fair     Standing balance support: Bilateral upper extremity supported Standing balance-[name]: Zero                              Cognition Arousal/Alertness: Awake/alert;Lethargic Behavior During Therapy: Flat affect Overall Cognitive Status: Impaired/Different from baseline Area of Impairment: Orientation;Attention;Following commands                 Orientation Level: Disoriented to;Place;Time;Situation Current Attention Level: Focused   Following Commands: Follows one step commands consistently;Follows one step commands with increased time Safety/Judgement: Decreased awareness of safety;Decreased awareness of deficits Awareness: Emergent Problem Solving: Slow processing;Difficulty sequencing;Requires verbal cues;Requires tactile cues        Exercises      General Comments        Pertinent Vitals/Pain Pain Assessment: Faces Faces Pain Scale: Hurts whole lot    Home Living                      Prior Function            PT Goals (current goals can now be found in the care plan section) Acute Rehab PT Goals Patient Stated Goal: non stated PT  Goal Formulation: With patient Time For Goal Achievement: 06/17/17 Potential to Achieve Goals: Fair Progress towards PT goals: Not progressing toward goals - comment(Weaker this visit than last)    Frequency    Min 2X/week      PT Plan Current plan remains appropriate    Co-evaluation              AM-PAC PT "6 Clicks" Daily Activity  Outcome Measure  Difficulty turning over in bed (including adjusting bedclothes, sheets and blankets)?: Unable Difficulty moving from lying on back to sitting on the side of the bed? : Unable Difficulty sitting down on and standing up from a chair with arms  (e.g., wheelchair, bedside commode, etc,.)?: Unable Help needed moving to and from a bed to chair (including a wheelchair)?: A Lot Help needed walking in hospital room?: Total Help needed climbing 3-5 steps with a railing? : Total 6 Click Score: 7    End of Session Equipment Utilized During Treatment: Gait belt Activity Tolerance: Patient limited by fatigue Patient left: in chair;with chair alarm set;with nursing/sitter in room Nurse Communication: Mobility status PT Visit Diagnosis: Other abnormalities of gait and mobility (R26.89)     Time: 4431-5400 PT Time Calculation (min) (ACUTE ONLY): 38 min  Charges:  $Therapeutic Activity: 8-22 mins                    G Codes:      Reinaldo Berber, PT, DPT Acute Rehab Services Pager: [phone]     Reinaldo Berber 06/09/2017, 4:24 PM

## 2017-06-09 NOTE — Progress Notes (Signed)
[facility] Progress Note Patient Name: Jamale Spangler DOB: [birth year] MRN: [id number]   Date of Service  06/09/2017  HPI/Events of Note  K+ = 3.4 and Creatinine = 1.59.  eICU Interventions  Will replace K+.     Intervention Category Major Interventions: Electrolyte abnormality - evaluation and management  Sommer,Steven Eugene 06/09/2017, 6:38 AM

## 2017-06-10 ENCOUNTER — Encounter (HOSPITAL_COMMUNITY): Payer: Self-pay | Admitting: *Deleted

## 2017-06-10 ENCOUNTER — Inpatient Hospital Stay (HOSPITAL_COMMUNITY): Payer: Medicare Other

## 2017-06-10 DIAGNOSIS — R1084 Generalized abdominal pain: Secondary | ICD-10-CM

## 2017-06-10 DIAGNOSIS — Z515 Encounter for palliative care: Secondary | ICD-10-CM

## 2017-06-10 DIAGNOSIS — Z7189 Other specified counseling: Secondary | ICD-10-CM

## 2017-06-10 LAB — COMPREHENSIVE METABOLIC PANEL
ALBUMIN: 2 g/dL — AB (ref 3.5–5.0)
ALK PHOS: 128 U/L — AB (ref 38–126)
ALT: 25 U/L (ref 17–63)
ANION GAP: 7 (ref 5–15)
AST: 35 U/L (ref 15–41)
BUN: 27 mg/dL — ABNORMAL HIGH (ref 6–20)
CALCIUM: 7.8 mg/dL — AB (ref 8.9–10.3)
CHLORIDE: 116 mmol/L — AB (ref 101–111)
CO2: 24 mmol/L (ref 22–32)
Creatinine, Ser: 1.64 mg/dL — ABNORMAL HIGH (ref 0.61–1.24)
GFR calc Af Amer: 47 mL/min — ABNORMAL LOW (ref >60)
GFR calc non Af Amer: 40 mL/min — ABNORMAL LOW (ref >60)
Glucose, Bld: 173 mg/dL — ABNORMAL HIGH (ref 65–99)
Potassium: 3.6 mmol/L (ref 3.5–5.1)
SODIUM: 147 mmol/L — AB (ref 135–145)
Total Bilirubin: 1.8 mg/dL — ABNORMAL HIGH (ref 0.3–1.2)
Total Protein: 6.3 g/dL — ABNORMAL LOW (ref 6.5–8.1)

## 2017-06-10 LAB — BASIC METABOLIC PANEL
Anion gap: 6 (ref 5–15)
BUN: 29 mg/dL — AB (ref 6–20)
CHLORIDE: 118 mmol/L — AB (ref 101–111)
CO2: 24 mmol/L (ref 22–32)
CREATININE: 1.61 mg/dL — AB (ref 0.61–1.24)
Calcium: 8.1 mg/dL — ABNORMAL LOW (ref 8.9–10.3)
GFR calc Af Amer: 48 mL/min — ABNORMAL LOW (ref >60)
GFR calc non Af Amer: 41 mL/min — ABNORMAL LOW (ref >60)
GLUCOSE: 119 mg/dL — AB (ref 65–99)
POTASSIUM: 3.2 mmol/L — AB (ref 3.5–5.1)
SODIUM: 148 mmol/L — AB (ref 135–145)

## 2017-06-10 LAB — CBC
HEMATOCRIT: 24.6 % — AB (ref 39.0–52.0)
Hemoglobin: 7.4 g/dL — ABNORMAL LOW (ref 13.0–17.0)
MCH: 26.1 pg (ref 26.0–34.0)
MCHC: 30.1 g/dL (ref 30.0–36.0)
MCV: 86.9 fL (ref 78.0–100.0)
PLATELETS: 162 10*3/uL (ref 150–400)
RBC: 2.83 MIL/uL — AB (ref 4.22–5.81)
RDW: 25.7 % — ABNORMAL HIGH (ref 11.5–15.5)
WBC: 7.7 10*3/uL (ref 4.0–10.5)

## 2017-06-10 LAB — GLUCOSE, CAPILLARY
GLUCOSE-CAPILLARY: 130 mg/dL — AB (ref 65–99)
GLUCOSE-CAPILLARY: 146 mg/dL — AB (ref 65–99)
GLUCOSE-CAPILLARY: 131 mg/dL — AB (ref 65–99)
GLUCOSE-CAPILLARY: 120 mg/dL — AB (ref 65–99)
Glucose-Capillary: 119 mg/dL — ABNORMAL HIGH (ref 65–99)
Glucose-Capillary: 127 mg/dL — ABNORMAL HIGH (ref 65–99)
Glucose-Capillary: 130 mg/dL — ABNORMAL HIGH (ref 65–99)

## 2017-06-10 LAB — MAGNESIUM
Magnesium: 1.7 mg/dL (ref 1.7–2.4)
Magnesium: 1.9 mg/dL (ref 1.7–2.4)

## 2017-06-10 LAB — PHOSPHORUS: Phosphorus: 3.1 mg/dL (ref 2.5–4.6)

## 2017-06-10 LAB — MRSA PCR SCREENING: MRSA BY PCR: NEGATIVE

## 2017-06-10 MED ORDER — MAGNESIUM SULFATE 2 GM/50ML IV SOLN
2.0000 g | Freq: Once | INTRAVENOUS | Status: AC
  Administered 2017-06-10: 2 g via INTRAVENOUS
  Filled 2017-06-10: qty 50

## 2017-06-10 MED ORDER — FENTANYL CITRATE (PF) 100 MCG/2ML IJ SOLN
100.0000 ug | Freq: Once | INTRAMUSCULAR | Status: AC
  Administered 2017-06-10: 100 ug via INTRAVENOUS
  Filled 2017-06-10: qty 2

## 2017-06-10 MED ORDER — POTASSIUM CHLORIDE 10 MEQ/100ML IV SOLN
10.0000 meq | INTRAVENOUS | Status: AC
  Administered 2017-06-10 (×3): 10 meq via INTRAVENOUS
  Filled 2017-06-10 (×3): qty 100

## 2017-06-10 MED ORDER — IOHEXOL 300 MG/ML  SOLN
100.0000 mL | Freq: Once | INTRAMUSCULAR | Status: AC | PRN
  Administered 2017-06-10: 80 mL via INTRAVENOUS

## 2017-06-10 NOTE — Progress Notes (Signed)
Subjective:  Patient denies any chest pain or shortness of breath.Tube   Feeding on hold now had paroxysmal A. Fib/SVTyesterday.  Objective:  Vital Signs in the last 24 hours: Temp:  [97.9 F (36.6 C)-100.4 F (38 C)] 98 F (36.7 C) (05/31 0809) Pulse Rate:  [70-118] 92 (05/31 0357) Resp:  [17] 17 (05/31 0357) BP: (112-154)/(56-77) 152/66 (05/31 0357) SpO2:  [93 %-100 %] 93 % (05/31 0357) Arterial Line BP: (99)/(50) 99/50 (05/30 0900) Weight:  [106.6 kg (235 lb 0.2 oz)] 106.6 kg (235 lb 0.2 oz) (05/31 0511)  Intake/Output from previous day: 05/30 0701 - 05/31 0700 In: 1113.7 [I.V.:183.7; NG/GT:385; IV Piggyback:500] Out: 4220 [Urine:2900; Drains:320; Stool:1000] Intake/Output from this shift: No intake/output data recorded.  Physical Exam: Neck: no adenopathy, no carotid bruit, no JVD and supple, symmetrical, trachea midline Lungs: bilateral rhonchi noted Heart: regular rate and rhythm, S1, S2 normal and soft systolic murmur noted Abdomen: distended.  Dressing being done.  J-P catheter drainage noted Extremities: generalized edema noted  Lab Results: Recent Labs    06/09/17 2334 06/10/17 0500  WBC 6.8 7.7  HGB 8.4* 7.4*  PLT 146* 162   Recent Labs    06/09/17 2334 06/10/17 0500  NA 148* 147*  K 3.2* 3.6  CL 118* 116*  CO2 24 24  GLUCOSE 119* 173*  BUN 29* 27*  CREATININE 1.61* 1.64*   No results for input(s): TROPONINI in the last 72 hours.  Invalid input(s): CK, MB Hepatic Function Panel Recent Labs    06/10/17 0500  PROT 6.3*  ALBUMIN 2.0*  AST 35  ALT 25  ALKPHOS 128*  BILITOT 1.8*   No results for input(s): CHOL in the last 72 hours. No results for input(s): PROTIME in the last 72 hours.  Imaging: Imaging results have been reviewed and [place] E[employer] Site Rite  Result Date: 06/09/2017 If Site Rite image not attached, placement could not be confirmed due to current cardiac rhythm.   Cardiac Studies:  Assessment/Plan:  Rectosigmoid adenocarcinoma  status post resection with primary colorectal anastomosis anddiverting loop ileostomy and umbilical hernia repair Resolving aspiration pneumonia Spontaneous retroperitoneal hematoma Abdominalsurgical wound infectionwith bowel exposed. Metabolic encephalopathy Status postSeptichypotensive shock secondary Intra-abdominal sepsis/possible abscess/persistent anastomosis leak Acute on chronic renal injury.improved Status post   paroxysmal A. 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 Tube Feeding versus TPN as per surgery Dr. Doylene Canard on call for me for weekend  LOS: 35 days    Charolette Forward 06/10/2017, 8:54 AM

## 2017-06-10 NOTE — Progress Notes (Signed)
NURSING PROGRESS NOTE  Arvid [place]aren[place] [id number] Admission Data: 06/10/2017 4:37 AM Attending Provider: Charolette Forward, MD RSW:NIOEVOJ, Prudencio Burly, MD Code Status: Full  Jesse Le is a 72 y.o. male patient admitted from ED:  -No acute distress noted.  -No complaints of shortness of breath.  -No complaints of chest pain.   Cardiac Monitoring: Bed cardiac monitoring in place. Cardiac monitor yields:atrial fibrillation, with ventricular rate of 98.  Blood pressure (!) 152/66, pulse 92, temperature 98.8 F (37.1 C), temperature source Oral, resp. rate 17, height 5\' 10"  (1.778 m), weight 113 kg (249 lb 1.9 oz), SpO2 93 %.   IV Fluids:  IV in place, occlusive dsg intact without redness, IV cath upper arm right, condition patent and no redness Cardizem infusing at 62ml/hr and potassium via a right arm double lumen picc.   Allergies:  Patient has no known allergies.  Past Medical History:   has a past medical history of Aneurysm of aorta ([place]), Coronary artery disease, Diabetes mellitus without complication ([place]), Hypertension, and Myocardial infarct ([place]).  Past Surgical History:   has a past surgical history that includes Video bronchoscopy (10/14/2011); intravascular ultrasound (10/14/2011); Abdominal aortic aneurysm repair (10-14-11); Colonoscopy with propofol (N/A, 05/08/2017); Colon resection sigmoid (N/A, 05/11/2017); Umbilical hernia repair (N/A, 05/11/2017); and Ileostomy (N/A, 05/11/2017).  Social History:   reports that he quit smoking about 5 years ago. His smoking use included cigarettes. He smoked 0.50 packs per day. He has never used smokeless tobacco. He reports that he drinks about 0.6 oz of alcohol per week. He reports that he does not use drugs.  Skin: Patient has a stage II on his sacral on transfer to the unit. Patient has general edema.   Patient/Family orientated to room. Information packet given to patient/family. Admission inpatient armband information verified with patient/family to  include name and date of birth and placed on patient arm. Side rails up x 2, fall assessment and education completed with patient/family. Patient/family able to verbalize understanding of risk associated with falls and verbalized understanding to call for assistance before getting out of bed. Call light within reach. Patient/family able to voice and demonstrate understanding of unit orientation instructions.    Will continue to evaluate and treat per MD orders.

## 2017-06-10 NOTE — Progress Notes (Signed)
[facility] Progress Note Patient Name: Jesse Le DOB: 12-07-1945 MRN: [id number]   Date of Service  06/10/2017  HPI/Events of Note  Potassium and Magnesium low.   eICU Interventions  Will replace both IV.        [name] 06/10/2017, 1:34 AM

## 2017-06-10 NOTE — Consult Note (Signed)
   [facility] Inpatient Consult   06/10/2017  Jesse Le 08/09/1945 [id number]   Follow up/Update notes:  Notes reveals Therapy is recommending SNF when patient is medically stable.  Natividad Brood, RN BSN [facility] Liaison  [phone] business mobile phone Toll free office [phone]

## 2017-06-10 NOTE — Progress Notes (Signed)
Chief Complaint: Patient was seen today for follow up abdominal abscess drain  Referring Physician(s): CCS  Supervising Physician: Jacqulynn Cadet  Patient Status: [facility] - In-pt  Subjective: Pt seen. Extubated Receiving TF via NGT, though currently on hold. Chart, prior imaging reviewed. Perc drain placed to right perihepatic collection on 5/16 CT last week 5/23 showed large undrained fluid collection in abdomen. Conservative management continued. Output has increased this week, almost 1L of recorded output.  Objective: Physical Exam: BP (!) 152/66 (BP Location: Left Arm)   Pulse 92   Temp 98 F (36.7 C) (Axillary)   Resp 17   Ht 5\' 10"  (1.778 m)   Wt 235 lb 0.2 oz (106.6 kg)   SpO2 93%   BMI 33.72 kg/m  Drain intact, site clean. Output cloudy yellow   Current Facility-Administered Medications:  .  acetaminophen (TYLENOL) solution 650 mg, 650 mg, Oral, Q8H PRN, Greer Pickerel, MD, 650 mg at 06/03/17 1954 .  albumin human 25 % solution 25 g, 25 g, Intravenous, Q6H, Rush Farmer, MD, 25 g at 06/09/17 2300 .  chlorhexidine (PERIDEX) 0.12 % solution 15 mL, 15 mL, Mouth Rinse, BID, Harwani, Mohan, MD, 15 mL at 06/09/17 2300 .  Chlorhexidine Gluconate Cloth 2 % PADS 6 each, 6 each, Topical, Daily, Harwani, Mohan, MD .  diltiazem (CARDIZEM) 100 mg in dextrose 5% 126mL (1 mg/mL) infusion, 5-15 mg/hr, Intravenous, Titrated, Sood, Vineet, MD, Last Rate: 15 mL/hr at 06/10/17 0200, 15 mg/hr at 06/10/17 0200 .  famotidine (PEPCID) 40 MG/5ML suspension 40 mg, 40 mg, Per Tube, Daily, Alvira Philips, [place], 40 mg at 06/09/17 1124 .  feeding supplement (PRO-STAT SUGAR FREE 64) liquid 30 mL, 30 mL, Per Tube, BID, Rush Farmer, MD .  feeding supplement (VITAL 1.5 CAL) liquid 1,000 mL, 1,000 mL, Per Tube, Continuous, Rush Farmer, MD, Stopped at 06/09/17 2044 .  fentaNYL (SUBLIMAZE) injection 50 mcg, 50 mcg, Intravenous, Q4H PRN, Jennelle Human B, NP, 50 mcg at 06/10/17 0605 .   free water 250 mL, 250 mL, Per Tube, Q6H, Rush Farmer, MD, 250 mL at 06/09/17 1000 .  Gerhardt's butt cream, , Topical, PRN, Earnstine Regal, PA-C .  hydrALAZINE (APRESOLINE) injection 20 mg, 20 mg, Intravenous, Q4H PRN, Tarry Kos, MD, 20 mg at 05/25/17 2310 .  hydrocortisone (ANUSOL-HC) 2.5 % rectal cream 1 application, 1 application, Topical, QID PRN, Michael Boston, MD .  hydrocortisone cream 1 % 1 application, 1 application, Topical, TID PRN, Michael Boston, MD .  insulin aspart (novoLOG) injection 0-15 Units, 0-15 Units, Subcutaneous, Q4H, Juanito Doom, MD, 2 Units at 06/10/17 0430 .  lip balm (CARMEX) ointment 1 application, 1 application, Topical, BID, Michael Boston, MD, 1 application at 81/85/63 2312 .  MEDLINE mouth rinse, 15 mL, Mouth Rinse, q12n4p, Harwani, Mohan, MD, 15 mL at 06/09/17 1600 .  [DISCONTINUED] ondansetron (ZOFRAN-ODT) disintegrating tablet 4 mg, 4 mg, Oral, Q6H PRN **OR** ondansetron (ZOFRAN) injection 4 mg, 4 mg, Intravenous, Q6H PRN, Donnie Mesa, MD, 4 mg at 06/04/17 1938 .  piperacillin-tazobactam (ZOSYN) IVPB 3.375 g, 3.375 g, Intravenous, Q8H, Harwani, Mohan, MD, Last Rate: 12.5 mL/hr at 06/10/17 0616, 3.375 g at 06/10/17 0616 .  prochlorperazine (COMPAZINE) injection 5-10 mg, 5-10 mg, Intravenous, Q4H PRN, Michael Boston, MD .  QUEtiapine (SEROQUEL) tablet 25 mg, 25 mg, Oral, BID, Rush Farmer, MD, 25 mg at 06/09/17 2300 .  sodium chloride flush (NS) 0.9 % injection 10-40 mL, 10-40 mL, Intracatheter, PRN,  Charolette Forward, MD, 10 mL at 06/03/17 2112 .  sodium chloride flush (NS) 0.9 % injection 10-40 mL, 10-40 mL, Intracatheter, Q12H, Harwani, Mohan, MD .  sodium chloride flush (NS) 0.9 % injection 10-40 mL, 10-40 mL, Intracatheter, PRN, Charolette Forward, MD .  sodium chloride flush (NS) 0.9 % injection 5 mL, 5 mL, Intracatheter, Q8H, Sandi Mariscal, MD, 5 mL at 06/10/17 0655 .  vitamin C (ASCORBIC ACID) tablet 500 mg, 500 mg, Per Tube, Daily,  Alvira Philips, [place], 500 mg at 06/09/17 1124  Labs: CBC Recent Labs    06/09/17 2334 06/10/17 0500  WBC 6.8 7.7  HGB 8.4* 7.4*  HCT 28.1* 24.6*  PLT 146* 162   BMET Recent Labs    06/09/17 2334 06/10/17 0500  NA 148* 147*  K 3.2* 3.6  CL 118* 116*  CO2 24 24  GLUCOSE 119* 173*  BUN 29* 27*  CREATININE 1.61* 1.64*  CALCIUM 8.1* 7.8*   LFT Recent Labs    06/10/17 0500  PROT 6.3*  ALBUMIN 2.0*  AST 35  ALT 25  ALKPHOS 128*  BILITOT 1.8*   PT/INR No results for input(s): LABPROT, INR in the last 72 hours.   Studies/Results: [place] [employer]  Result Date: 06/09/2017 If [employer] not attached, placement could not be confirmed due to current cardiac rhythm.   Assessment/Plan: 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 Low anterior resection with primary colorectal anastomosis, diverting loop ileostomy, rigid proctoscopy, umbilical hernia repair 09/12/98 -Postop anastomotic leak S/p perc drain 05/26/17 D/w Dr. Ninfa Linden, would like to reassess intra-abdominal fluid collections. CT A/P ordered for today, possible need for further drain placement pending results.      LOS: 35 days   I spent a total of 20 minutes in face to face in clinical consultation, greater than 50% of which was counseling/coordinating care for abdominal abscess  Ascencion Dike PA-C 06/10/2017 9:50 AM

## 2017-06-10 NOTE — Consult Note (Addendum)
[facility] Nurse wound and ostomy follow up Surgical team following for assessment and plan of care for abd wound; surgeon and PA at bedside to assess wound appearance during Vac dressing change.   Ostomy:  Stoma type/location:Ileostomy surgery performed on 5/1 Stoma red and viable, 1 inch, above skin level. Previously noted mucocutaneous separation has resolved; applied barrier ring to assist with protecting site. Output:small amt liquid yellow stool Ostomy pouching:2pc.  Education provided:Pt is in stepdown unit and is lethargic; no family present.He did not watch the pouch change procedure or ask questions.Supplies at bedside for staff nurse use. Ptwill not be able to change or empty pouch without totalassistance after discharge. Enrolled patient in [place] Discharge program:Yes, awhile ago [place] team will continue to follow while in the hospital   Wound:  Reason for Consult: Vac change to abd wound Wound type: Full thickness post-op wound to midline abd, refer to Wed note for measurements.   Wound bed: Slightly decreasing amt of slough/eschar to right wound, PA debrided some of the nonviable tissue; rest of wound is red with visible retention sutures. Drainage (amount, consistency, odor) mod amt yellow drainage, no odor Dressing procedure/placement/frequency: Applied Mepitel contact layer to protect the wound bed, then one piece white foam, then one piece black foam to 163mm cont suction.  Pt tolerated with minimal amt discomfort. Abd binder reapplied over abd.  Left abd with full thickness wound; 1.5X1.5X.2cm, 90% red, 10% slough, no odor or drainage.  Foam dressing applied.  [place] team will change Vac and ostomy pouch Q M/W/F Julien Girt MSN, RN, [place], [place], [place]

## 2017-06-10 NOTE — Clinical Social Work Note (Signed)
CSW continues to follow for discharge needs.  Isa Hitz, CSW [phone]  

## 2017-06-10 NOTE — Progress Notes (Signed)
  Speech Language Pathology Treatment: Dysphagia  Patient Details Name: Jesse Le MRN: [id number] DOB: December 19, 1945 Today's Date: 06/10/2017 Time: 8341-9622 SLP Time Calculation (min) (ACUTE ONLY): 13 min  Assessment / Plan / Recommendation Clinical Impression  Improved ability to tolerate upright position during dysphagia treatment and cleared wet vocal quality when cued. Oral cavity continues to be pink without dried mucousa. Fair-good subjective observation of swallow initiation and multiple swallows indicative of possible residue. He is appropriate for objective evaluation and MBS scheduled for 1300 today.    HPI HPI: 72 yo male presented with abdominal pain, weight loss, intermittent hematochezia and found to have 9 cm colon mass from adenocarcinoma of recto-sigmoid colon.  Underwent low anterior resection with primary anastomosis with diverting loop ileostomy.  Developed hypotension, hypoglycemia 5/07 and altered mental status 5/08 and transferred to ICU wit septic shock secondary to intraabdominal source (peritonitis vs wound infection). Required intubation 5/22 due to tachycardua and hypoxia. Self extubated 5/28. CXR 5/28 Persistent right upper lobe and left lower lobe infiltrate. Persistent bibasilar atelectasis. Repeat CXR 5/29 Continued streaky right upper lung opacity could reflect atelectasis or infection PMHx of CAD, DM, TAA, MI, HTN. BSE 05/27/17 rec'd D1, thin.      SLP Plan  MBS       Recommendations  Diet recommendations: NPO Medication Administration: Via alternative means                Oral Care Recommendations: Oral care QID Follow up Recommendations: Skilled Nursing facility SLP Visit Diagnosis: Dysphagia, unspecified (R13.10) Plan: MBS       GO                [name] 06/10/2017, 11:37 AM  [name] Colvin [place].[employer] [phone]

## 2017-06-10 NOTE — Progress Notes (Signed)
30 Days Post-Op    CC:  Abdominal pain and poor appetite  Subjective: Pt had some emesis last PM and Tube feeding is on hold.  Still confused, the nurse could get him to say his name but that is all.  We took down his wound vac and ostomy for dressing change and he did not respond at all to this.  He has been started on Seroquel and Cardizem over the last 48 hours.  His wound looks pretty good I have pictures below.    Objective: Vital signs in last 24 hours: Temp:  [97.9 F (36.6 C)-100.4 F (38 C)] 98.8 F (37.1 C) (05/31 0357) Pulse Rate:  [70-118] 92 (05/31 0357) Resp:  [17] 17 (05/31 0357) BP: (87-154)/(44-77) 152/66 (05/31 0357) SpO2:  [93 %-100 %] 93 % (05/31 0357) Arterial Line BP: (99)/(50) 99/50 (05/30 0900) Weight:  [106.6 kg (235 lb 0.2 oz)] 106.6 kg (235 lb 0.2 oz) (05/31 0511) Last BM Date: 06/10/17  700 IV 385 NG/TF Urine 2900 Drain 320 Emesis x 1 Ostomy 1000 TM 100.4, but fever is down and tachycardia is a little better.BP OK CMP stable, WBC 7.7, H/H down slightly Platelets up slightly IR drain 5/16, last CT 06/02/17   Intake/Output from previous day: 05/30 0701 - 05/31 0700 In: 1113.7 [I.V.:183.7; NG/GT:385; IV Piggyback:500] Out: 1497 [Urine:2900; Drains:320; Stool:1000] Intake/Output this shift: No intake/output data recorded.  General appearance: he is awake but doesn't really interact with anyone.  Remains very confused. Resp: sounds congested with ronchi, gurgling sounds at times. GI: Ostomy looks fine.  Open wound is stable, still some green on the right side.  Ostomy working     1st Wound vac change 06/10/17    06/07/17 picture     Lab Results:  Recent Labs    06/09/17 2334 06/10/17 0500  WBC 6.8 7.7  HGB 8.4* 7.4*  HCT 28.1* 24.6*  PLT 146* 162    BMET Recent Labs    06/09/17 2334 06/10/17 0500  NA 148* 147*  K 3.2* 3.6  CL 118* 116*  CO2 24 24  GLUCOSE 119* 173*  BUN 29* 27*  CREATININE 1.61* 1.64*  CALCIUM 8.1* 7.8*     PT/INR No results for input(s): LABPROT, INR in the last 72 hours.  Recent Labs  Lab 06/05/17 0326 06/06/17 0450 06/10/17 0500  AST 41 38 35  ALT 31 30 25   ALKPHOS 155* 143* 128*  BILITOT 1.3* 1.1 1.8*  PROT 5.6* 5.5* 6.3*  ALBUMIN 1.1* <1.0* 2.0*     Lipase     Component Value Date/Time   LIPASE 22 05/06/2017 1036     Medications: . chlorhexidine  15 mL Mouth Rinse BID  . Chlorhexidine Gluconate Cloth  6 each Topical Daily  . famotidine  40 mg Per Tube Daily  . feeding supplement (PRO-STAT SUGAR FREE 64)  30 mL Per Tube BID  . free water  250 mL Per Tube Q6H  . insulin aspart  0-15 Units Subcutaneous Q4H  . lip balm  1 application Topical BID  . mouth rinse  15 mL Mouth Rinse q12n4p  . QUEtiapine  25 mg Oral BID  . sodium chloride flush  10-40 mL Intracatheter Q12H  . sodium chloride flush  5 mL Intracatheter Q8H  . vitamin C  500 mg Per Tube Daily   Anti-infectives (From admission, onward)   Start     Dose/Rate Route Frequency Ordered Stop   05/28/17 1200  vancomycin (VANCOCIN) 1,250 mg in sodium  chloride 0.9 % 250 mL IVPB  Status:  Discontinued     1,250 mg 166.7 mL/hr over 90 Minutes Intravenous Every 24 hours 05/27/17 1324 05/30/17 1615   05/27/17 1100  vancomycin (VANCOCIN) 1,250 mg in sodium chloride 0.9 % 250 mL IVPB  Status:  Discontinued     1,250 mg 166.7 mL/hr over 90 Minutes Intravenous Every 12 hours 05/27/17 1011 05/27/17 1324   05/20/17 2230  vancomycin (VANCOCIN) 1,250 mg in sodium chloride 0.9 % 250 mL IVPB  Status:  Discontinued     1,250 mg 166.7 mL/hr over 90 Minutes Intravenous Every 24 hours 05/20/17 2151 05/27/17 1011   05/20/17 1400  piperacillin-tazobactam (ZOSYN) IVPB 3.375 g     3.375 g 12.5 mL/hr over 240 Minutes Intravenous Every 8 hours 05/20/17 1044     05/20/17 0500  anidulafungin (ERAXIS) 100 mg in sodium chloride 0.9 % 100 mL IVPB  Status:  Discontinued     100 mg 78 mL/hr over 100 Minutes Intravenous Every 24 hours 05/19/17  0116 05/29/17 1214   05/19/17 2200  vancomycin (VANCOCIN) 1,500 mg in sodium chloride 0.9 % 500 mL IVPB  Status:  Discontinued     1,500 mg 250 mL/hr over 120 Minutes Intravenous Every 24 hours 05/18/17 2126 05/20/17 2150   05/19/17 0130  anidulafungin (ERAXIS) 200 mg in sodium chloride 0.9 % 200 mL IVPB     200 mg 78 mL/hr over 200 Minutes Intravenous  Once 05/19/17 0116 05/19/17 0520   05/19/17 0130  Ampicillin-Sulbactam (UNASYN) 3 g in sodium chloride 0.9 % 100 mL IVPB  Status:  Discontinued     3 g 200 mL/hr over 30 Minutes Intravenous Every 8 hours 05/19/17 0118 05/20/17 1044   05/18/17 2200  vancomycin (VANCOCIN) 1,750 mg in sodium chloride 0.9 % 500 mL IVPB     1,750 mg 250 mL/hr over 120 Minutes Intravenous  Once 05/18/17 2126 05/19/17 0400   05/17/17 0930  piperacillin-tazobactam (ZOSYN) IVPB 3.375 g  Status:  Discontinued     3.375 g 12.5 mL/hr over 240 Minutes Intravenous Every 8 hours 05/17/17 0836 05/19/17 0118   05/11/17 2200  cefoTEtan (CEFOTAN) 2 g in sodium chloride 0.9 % 100 mL IVPB     2 g 200 mL/hr over 30 Minutes Intravenous Every 12 hours 05/11/17 1839 05/11/17 2034   05/11/17 0600  cefoTEtan in Dextrose 5% (CEFOTAN) IVPB 2 g     2 g Intravenous On call to O.R. 05/10/17 1142 05/11/17 1416     . albumin human    . diltiazem (CARDIZEM) infusion 15 mg/hr (06/10/17 0200)  . feeding supplement (VITAL 1.5 CAL) Stopped (06/09/17 2044)  . piperacillin-tazobactam (ZOSYN)  IV 3.375 g (06/10/17 7829)   Assessment/Plan Coronary artery disease. 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 Low  anterior resection with primary colorectal anastomosis, diverting loop ileostomy, rigid proctoscopy, umbilical hernia repair 05/16/2128 Dr. Donnie Mesa, POD 30  -Postop anastomotic leak  - IR drain 05/26/17 -Wound dehiscence/possible exposed bowel at the base of the wound.  -Wound VAC placement 06/08/2017  Ventilator dependent respiratory failure-failed attempted extubation- self extubated 06/07/17  good saturations on [place]   FEN: IV fluids/Tube feeding/NPO ID: Cefotetan pre op; Vancomycin 5/8 -05/30/17; Eraxis 5/9 - 05/29/17; Zosyn 5/7 =>>  day 23 DVT: SCD Follow up: Dr. Georgette Dover   Plan:  Dr. Ninfa Linden has seen the wound and we will continue the wound vac.  I have ask IR to review and discuss when they want to repeat the CT scan.  I will defer Pulmonary issues and TF to CCM.   LOS: 35 days    Jesse Le 06/10/2017 [phone]

## 2017-06-10 NOTE — Progress Notes (Signed)
Patient ID: Jesse Le, male   DOB: 1945-02-02, 72 y.o.   MRN: [id number]   CT scan looks much better.  There are still several fluid collections but not specifically worrisome for abscesses.  Again, he remains afebrile, has no fever, and clinically is stable. Will hold on IR doing any aspiration or drains unless they feel strongly about it.

## 2017-06-10 NOTE — Progress Notes (Signed)
Modified Barium Swallow Progress Note  Patient Details  Name: Jesse Le MRN: [id number] Date of Birth: 1945-03-31  Today's Date: 06/10/2017  Modified Barium Swallow completed.  Full report located under Chart Review in the Imaging Section.  Brief recommendations include the following:  Clinical Impression  Dysphagia present in the oral phase marked by reduced posterior propulsion with pt extending his head to faciliate transit to pharynx. Incomplete labial seal on cup with parital bolus leaking from mouth. Barium pooled in his anterior sulci throughout study with decreased cohesion and prolonged mastication. Overall, pharyngeal phase was normal with noted prolonged laryngeal elevation likely in attempts to sustain tracheal protection during the swallow. He did become noteably dyspneic with Spo2 and RR in normal range. Of note, pt coughed x 3 without evidence of penetration or aspiration observed. Esophagus appeared within functional limits with informal, non-diagnostic view. Pt has had a complicated and prolonged hospital stay, mostly involving GI system and transferred to progressive unit therefore will progress slowly. Plan to continue NPO, consult with MD to determine ability to initiate po's with ST.    Swallow Evaluation Recommendations       SLP Diet Recommendations: NPO       Medication Administration: Via alternative means               Oral Care Recommendations: Oral care QID        Jesse Le 06/10/2017,2:18 PM   Jesse Le [place].[employer] [phone]

## 2017-06-10 NOTE — Consult Note (Addendum)
Consultation Note Date: 06/10/2017   Patient Name: Jesse Le  DOB: 1945-10-16  MRN: [id number]  Age / Sex: 72 y.o., male  PCP: Jesse Forward, MD Referring Physician: Charolette Forward, MD  Reason for Consultation: Establishing goals of care and Psychosocial/spiritual support  HPI/Patient Profile: 72 y.o. male  with past medical history of TAA, MI, hypertension, diabetes, coronary artery disease admitted on 05/06/2017 with abdominal abscess.  Patient was diagnosed in 2019 with rectosigmoid adenocarcinoma.  He underwent resection with anastomosis and diverting ileostomy.  He presented on 05/06/2017 with worsening abdominal pain and was found to have a large fluid collection in his abdomen.  A percutaneous drain has been placed.  Since that Jesse Le he is required intubation, ICU and is now extubated on stepdown unit.  On 06/10/2017 CT of the abdomen shows a decrease in fluid collection in some places, but other areas unchanged.  He has had an episode of atrial fib with SVT and tube feedings are currently on hold.  Modified barium swallow performed on 5/31 with mild to moderate aspiration risk revealed.  Consult ordered for goals of care.   Clinical Assessment and Goals of Care: Met with patient, his significant other, Jesse Le, as well as chart reviewed.  Jesse Le and Jesse Le have been together for 25 years.  He has never married nor does he have any biological children .Marland Kitchen  He helped raise Ms. Jesse Le son and daughter.  Her daughter, Jesse Le. Jesse Le, is patient's healthcare power of attorney.  Jesse Le relies heavily on Jesse Le for healthcare information.  Patient at this point can only participate in goals of care discussion to a very limited degree.  His healthcare power of attorney is Mosetta Le [phone].  This is by report from patient's partner, Jesse Le.  We do not have healthcare power of attorney paperwork on  file  Engaged in brief life review.  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  As noted, he has been with Jesse Le for 25 years.  Jesse Le shares with me that he had been sick for 6 months or longer prior to going to the doctor when he found out he had rectosigmoid adenocarcinoma.   Reviewed with Jesse Le patient's modified barium swallow results.  Defined terms of dysphasia, aspiration.  Also discussed CODE STATUS, defined terms.  Patient is a full code by choice.  Per chart review, patient was a DNR in 2013.  Jesse Le is not familiar with that he was DNR at one point.  Jesse Le states "I know this is going to be a long, hard road"; she sees him improving in that he is now no longer in ICU or intubated    SUMMARY OF RECOMMENDATIONS   Confirmed full code by choice, not default Continue full scope of treatment Palliative medicine to stay involved and assist patient and family with goals of care.  We will touch base with healthcare POA this weekend to set up further  goals of care meeting.  Tube feedings via core track are currently on hold.  Further goals of care regarding artificial nutrition need to be addressed with patient, significant other as well as healthcare power of attorney.  Given that patient has an abdominal abscess I am not certain that PEG placement is something that is being offered. Hard Choices form [place] booklet as well as MOST form given to pt's SO, Jesse Le  Code Status/Advance Care Planning:  Full code    Symptom Management:   Pain: Patient reports having intermittent abdominal pain relieved by fentanyl.  Continue fentanyl IV 50 mcg every 4 hours as needed  Agitation: Questionable hypoactive delirium; continue Seroquel 25 mg twice daily.  Seroquel has a wide dosage range and this certainly could be uptitrated for further effect if needed  Palliative Prophylaxis:    Aspiration, Bowel Regimen, Delirium Protocol, Eye Care, Frequent Pain Assessment, Oral Care and Turn Reposition  Additional Recommendations (Limitations, Scope, Preferences):  Full Scope Treatment  Psycho-social/Spiritual:   Desire for further Chaplaincy support:no  Additional Recommendations: Referral to Community Resources   Prognosis:   Unable to determine  Discharge Planning: To Be Determined      Primary Diagnoses: Present on Admission: . Generalized abdominal pain   I have reviewed the medical record, interviewed the patient and family, and examined the patient. The following aspects are pertinent.  Past Medical History:  Diagnosis Date  . Aneurysm of aorta (HCC)   . Coronary artery disease   . Diabetes mellitus without complication ([place])    NEW ONSET 09/2012  . Hypertension   . Myocardial infarct [facility])    Social History   Socioeconomic History  . Marital status: Married    Spouse name: Not on file  . Number of children: Not on file  . Years of education: Not on file  . Highest education level: Not on file  Occupational History  . Not on file  Social Needs  . Financial resource strain: Not on file  . Food insecurity:    Worry: Not on file    Inability: Not on file  . Transportation needs:    Medical: Not on file    Non-medical: Not on file  Tobacco Use  . Smoking status: Former Smoker    Packs/day: 0.50    Types: Cigarettes    Last attempt to quit: 07/20/2011    Years since quitting: 5.8  . Smokeless tobacco: Never Used  Substance and Sexual Activity  . Alcohol use: Yes    Alcohol/week: 0.6 oz    Types: 1 Shots of liquor per week    Comment: weekends  . Drug use: No  . Sexual activity: Not on file  Lifestyle  . Physical activity:    Days per week: Not on file    Minutes per session: Not on file  . Stress: Not on file  Relationships  . Social connections:    Talks on phone: Not on file    Gets together: Not on file    Attends  religious service: Not on file    Active member of club or organization: Not on file    Attends meetings of clubs or organizations: Not on file    Relationship status: Not on file  Other Topics Concern  . Not on file  Social History Narrative  . Not on file   History reviewed. No pertinent family history. Scheduled Meds: . chlorhexidine  15 mL Mouth Rinse BID  . Chlorhexidine Gluconate Cloth  6 each Topical Daily  . famotidine  40 mg Per Tube Daily  . feeding supplement (PRO-STAT SUGAR FREE 64)  30 mL Per Tube BID  . free water  250 mL Per Tube Q6H  . insulin aspart  0-15 Units Subcutaneous Q4H  . lip balm  1 application Topical BID  . mouth rinse  15 mL Mouth Rinse q12n4p  . QUEtiapine  25 mg Oral BID  . sodium chloride flush  10-40 mL Intracatheter Q12H  . sodium chloride flush  5 mL Intracatheter Q8H  . vitamin C  500 mg Per Tube Daily   Continuous Infusions: . albumin human 25 g (06/10/17 0957)  . diltiazem (CARDIZEM) infusion 15 mg/hr (06/10/17 1004)  . feeding supplement (VITAL 1.5 CAL) Stopped (06/09/17 2044)  . piperacillin-tazobactam (ZOSYN)  IV 3.375 g (06/10/17 1251)   PRN Meds:.acetaminophen (TYLENOL) oral liquid 160 mg/5 mL, fentaNYL (SUBLIMAZE) injection, Gerhardt's butt cream, hydrALAZINE, hydrocortisone, hydrocortisone cream, [DISCONTINUED] ondansetron **OR** ondansetron (ZOFRAN) IV, prochlorperazine, sodium chloride flush, sodium chloride flush Medications Prior to Admission:  Prior to Admission medications   Medication Sig Start Date End Date Taking? Authorizing Provider  aspirin EC 81 MG tablet Take 81 mg by mouth daily.   Yes [provider]  metFORMIN (GLUCOPHAGE) 500 MG tablet Take 1 tablet (500 mg total) by mouth 2 (two) times daily with a meal. 10/13/12  Yes Jesse Forward, MD  metoprolol tartrate (LOPRESSOR) 50 MG tablet Take 50 mg by mouth 2 (two) times daily.   Yes [provider]  glimepiride (AMARYL) 2 MG tablet Take 1 tablet (2 mg  total) by mouth daily with breakfast. Patient not taking: Reported on 05/06/2017 10/13/12   Jesse Forward, MD   No Known Allergies Review of Systems  Unable to perform ROS: Acuity of condition    Physical Exam  Constitutional:  Acutely ill-appearing older man; he is alert, attempting to communicate  HENT:  Head: Normocephalic and atraumatic.  Cardiovascular:  PVCs; A. fib with SVT on 06/09/2017  Abdominal:  Percutaneous drain  Neurological: He is alert.  Recognizes his partner, that he is in the hospital Vocal quality wet dysarthric  Skin: Skin is warm and dry.  Psychiatric:  Questionable hypoactive delirium Patient in mittens  Nursing note and vitals reviewed.   Vital Signs: BP (!) 167/72 (BP Location: Left Arm)   Le 84   Temp 97.8 F (36.6 C) (Oral)   Resp (!) 21   Ht '5\' 10"'$  (1.778 m)   Wt 106.6 kg (235 lb 0.2 oz)   SpO2 97%   BMI 33.72 kg/m  Pain Scale: 0-10 POSS *See Group Information*: 2-Acceptable,Slightly drowsy, easily aroused Pain Score: 5    SpO2: SpO2: 97 % O2 Device:SpO2: 97 % O2 Flow Rate: .O2 Flow Rate (L/min): 2 L/min  IO: Intake/output summary:   Intake/Output Summary (Last 24 hours) at 06/10/2017 1622 Last data filed at 06/10/2017 1202 Gross per 24 hour  Intake 594.67 ml  Output 5120 ml  Net -4525.33 ml    LBM: Last BM Date: 06/10/17 Baseline Weight: Weight: 94.8 kg (208 lb 15.9 oz) Most recent weight: Weight: 106.6 kg (235 lb 0.2 oz)     Palliative Assessment/Data:   Flowsheet Rows     Most Recent Value  Intake Tab  Referral Department  Cardiology  Unit at Jesse Le of Referral  Intermediate Care Unit  Palliative Care Primary Diagnosis  Sepsis/Infectious Disease  Date Notified  06/09/17  Reason for referral  Clarify Goals of Care  Date  of Admission  05/06/17  Date first seen by Palliative Care  06/10/17  # of days Palliative referral response Jesse Le  1 Day(s)  # of days IP prior to Palliative referral  34  Clinical Assessment   Palliative Performance Scale Score  30%  Pain Max last 24 hours  Not able to report  Pain Min Last 24 hours  Not able to report  Dyspnea Max Last 24 Hours  Not able to report  Dyspnea Min Last 24 hours  Not able to report  Nausea Max Last 24 Hours  Not able to report  Nausea Min Last 24 Hours  Not able to report  Anxiety Max Last 24 Hours  Not able to report  Anxiety Min Last 24 Hours  Not able to report  Other Max Last 24 Hours  Not able to report  Psychosocial & Spiritual Assessment  Palliative Care Outcomes  Patient/Family meeting held?  Yes  Who was at the meeting?  pt and SO  Palliative Care Outcomes  Provided psychosocial or spiritual support, ACP counseling assistance  Palliative Care follow-up planned  Yes, Facility      Jesse Le In: [id number] Jesse Le Out: 1640 Jesse Le Total: 70 min Greater than 50%  of this Jesse Le was spent counseling and coordinating care related to the above assessment and plan.  Signed by: Dory Horn, NP   Please contact Palliative Medicine Team phone at [phone] for questions and concerns.  For individual provider: See Shea Evans

## 2017-06-11 LAB — BASIC METABOLIC PANEL
ANION GAP: 10 (ref 5–15)
BUN: 20 mg/dL (ref 6–20)
CHLORIDE: 113 mmol/L — AB (ref 101–111)
CO2: 23 mmol/L (ref 22–32)
Calcium: 8.1 mg/dL — ABNORMAL LOW (ref 8.9–10.3)
Creatinine, Ser: 1.49 mg/dL — ABNORMAL HIGH (ref 0.61–1.24)
GFR calc Af Amer: 52 mL/min — ABNORMAL LOW (ref >60)
GFR, EST NON AFRICAN AMERICAN: 45 mL/min — AB (ref >60)
Glucose, Bld: 180 mg/dL — ABNORMAL HIGH (ref 65–99)
POTASSIUM: 3.3 mmol/L — AB (ref 3.5–5.1)
SODIUM: 146 mmol/L — AB (ref 135–145)

## 2017-06-11 LAB — GLUCOSE, CAPILLARY
GLUCOSE-CAPILLARY: 134 mg/dL — AB (ref 65–99)
GLUCOSE-CAPILLARY: 135 mg/dL — AB (ref 65–99)
Glucose-Capillary: 122 mg/dL — ABNORMAL HIGH (ref 65–99)
Glucose-Capillary: 135 mg/dL — ABNORMAL HIGH (ref 65–99)
Glucose-Capillary: 147 mg/dL — ABNORMAL HIGH (ref 65–99)
Glucose-Capillary: 149 mg/dL — ABNORMAL HIGH (ref 65–99)

## 2017-06-11 LAB — CBC
HCT: 24.8 % — ABNORMAL LOW (ref 39.0–52.0)
HEMOGLOBIN: 7.4 g/dL — AB (ref 13.0–17.0)
MCH: 25.7 pg — ABNORMAL LOW (ref 26.0–34.0)
MCHC: 29.8 g/dL — ABNORMAL LOW (ref 30.0–36.0)
MCV: 86.1 fL (ref 78.0–100.0)
PLATELETS: 181 10*3/uL (ref 150–400)
RBC: 2.88 MIL/uL — AB (ref 4.22–5.81)
RDW: 26.3 % — ABNORMAL HIGH (ref 11.5–15.5)
WBC: 6 10*3/uL (ref 4.0–10.5)

## 2017-06-11 NOTE — Progress Notes (Signed)
Portable was unable to find a feeding pump until 1500

## 2017-06-11 NOTE — Progress Notes (Signed)
  Progress Note: General Surgery Service   Assessment/Plan: Patient Active Problem List   Diagnosis Date Noted  . Goals of care, counseling/discussion   . Palliative care by specialist   . Pressure injury of skin 06/09/2017  . Ascites   . Intra-abdominal abscess ([place])   . Pseudomonas infection   . Acute respiratory failure with hypoxemia ([place])   . Atelectasis   . Septic shock ([place])   . Hypoglycemia after GI (gastrointestinal) surgery   . Colostomy present ([place])   . Ventral hernia s/p primary repair 05/11/2017 05/14/2017  . Physical deconditioning 05/14/2017  . CAD (coronary atherosclerotic disease) 05/14/2017  . Diabetes mellitus without complication ([place])   . Acute blood loss anemia   . 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 Iron deficiency anemia due to chronic blood loss   . Abnormal CT scan, sigmoid colon   . Generalized abdominal pain 05/06/2017  . 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 Aftercare following surgery of the circulatory system, [place] 11/22/2011   s/p Procedure(s): Colon Resection Sigmoid Low Anterior Resection with Staple Colon Relast HERNIA REPAIR UMBILICAL ADULT Creation Vertical Loop ILEOSTOMY 05/11/2017 -restart tube feeds -continue vac care -f/u palliative recommendations    LOS: 36 days  Chief Complaint/Subjective: Tube feeds stopped yesterday, no events overnight  Objective: Vital signs in last 24 hours: Temp:  [97.6 F (36.4 C)-98.1 F (36.7 C)] 98 F (36.7 C) (06/01 0809) Pulse Rate:  [84-95] 95 (06/01 0809) Resp:  [17-24] 17 (06/01 0809) BP: (146-167)/(69-90) 146/75 (06/01 0809) SpO2:  [94 %-97 %] 94 % (06/01 0809) Weight:  [116.6 kg (257 lb)] 116.6 kg (257 lb) (06/01 0354) Last BM Date: 06/10/17  Intake/Output from previous day: 05/31 0701 - 06/01 0700 In: 666 [I.V.:166; IV Piggyback:500] Out: 2195 [Urine:1900; Drains:70; Stool:225] Intake/Output this shift: No intake/output data recorded.  Gen:  NAD  Cardiovascular: RRR  Abd: soft, vac in place, ostomy with brown liquid in bag, pink and patent  Extremities: mittens in place  Neuro: alert, mumbling, moving all extremities  Studies/Results:  n/a   Mickeal Skinner, MD [facility]

## 2017-06-11 NOTE — Plan of Care (Signed)
  Problem: Coping: Goal: Level of anxiety will decrease Outcome: Progressing   Problem: Education: Goal: Knowledge of General Education information will improve Outcome: Not Progressing   Problem: Health Behavior/Discharge Planning: Goal: Ability to manage health-related needs will improve Outcome: Not Progressing   Problem: Clinical Measurements: Goal: Respiratory complications will improve Outcome: Not Progressing   Problem: Activity: Goal: Ability to tolerate increased activity will improve Outcome: Not Progressing   Problem: Respiratory: Goal: Ability to maintain a clear airway and adequate ventilation will improve Outcome: Not Progressing

## 2017-06-11 NOTE — Progress Notes (Signed)
Unable to charge output from JP drain. Cap on the drain came off and the fluid was in the bed.

## 2017-06-11 NOTE — Progress Notes (Signed)
Ref: Charolette Forward, MD   Subjective:  Awake. Monitor shows sinus rhythm.  Objective:  Vital Signs in the last 24 hours: Temp:  [97.6 F (36.4 C)-98.1 F (36.7 C)] 98 F (36.7 C) (06/01 0809) Pulse Rate:  [84-95] 95 (06/01 0809) Cardiac Rhythm: Normal sinus rhythm (06/01 0755) Resp:  [17-24] 17 (06/01 0809) BP: (146-167)/(69-90) 146/75 (06/01 0809) SpO2:  [94 %-97 %] 94 % (06/01 0809) Weight:  [116.6 kg (257 lb)] 116.6 kg (257 lb) (06/01 0354)  Physical Exam: BP Readings from Last 1 Encounters:  06/11/17 (!) 146/75     Wt Readings from Last 1 Encounters:  06/11/17 116.6 kg (257 lb)    Weight change: 9.974 kg (21 lb 15.8 oz) Body mass index is 36.88 kg/m. HEENT: [place]/AT, Eyes-Brown, Conjunctiva-Pale, Sclera-Non-icteric Neck: No JVD, No bruit, Trachea midline. Lungs:  Rhonchi, Bilateral. Cardiac:  Regular rhythm, normal S1 and S2, no S3. II/VI systolic murmur. Abdomen:  Covered with dressing. BS present. Extremities:  2+ edema present. No cyanosis. No clubbing. CNS: AxOx2, Cranial nerves grossly intact, moves all 4 extremities.  Skin: Warm and dry.   Intake/Output from previous day: 05/31 0701 - 06/01 0700 In: 666 [I.V.:166; IV Piggyback:500] Out: 2195 [Urine:1900; Drains:70; Stool:225]    Lab Results: BMET    Component Value Date/Time   NA 146 (H) 06/11/2017 0330   NA 147 (H) 06/10/2017 0500   NA 148 (H) 06/09/2017 2334   K 3.3 (L) 06/11/2017 0330   K 3.6 06/10/2017 0500   K 3.2 (L) 06/09/2017 2334   CL 113 (H) 06/11/2017 0330   CL 116 (H) 06/10/2017 0500   CL 118 (H) 06/09/2017 2334   CO2 23 06/11/2017 0330   CO2 24 06/10/2017 0500   CO2 24 06/09/2017 2334   GLUCOSE 180 (H) 06/11/2017 0330   GLUCOSE 173 (H) 06/10/2017 0500   GLUCOSE 119 (H) 06/09/2017 2334   BUN 20 06/11/2017 0330   BUN 27 (H) 06/10/2017 0500   BUN 29 (H) 06/09/2017 2334   CREATININE 1.49 (H) 06/11/2017 0330   CREATININE 1.64 (H) 06/10/2017 0500   CREATININE 1.61 (H) 06/09/2017 2334    CALCIUM 8.1 (L) 06/11/2017 0330   CALCIUM 7.8 (L) 06/10/2017 0500   CALCIUM 8.1 (L) 06/09/2017 2334   GFRNONAA 45 (L) 06/11/2017 0330   GFRNONAA 40 (L) 06/10/2017 0500   GFRNONAA 41 (L) 06/09/2017 2334   GFRAA 52 (L) 06/11/2017 0330   GFRAA 47 (L) 06/10/2017 0500   GFRAA 48 (L) 06/09/2017 2334   CBC    Component Value Date/Time   WBC 6.0 06/11/2017 0330   RBC 2.88 (L) 06/11/2017 0330   HGB 7.4 (L) 06/11/2017 0330   HCT 24.8 (L) 06/11/2017 0330   PLT 181 06/11/2017 0330   MCV 86.1 06/11/2017 0330   MCH 25.7 (L) 06/11/2017 0330   MCHC 29.8 (L) 06/11/2017 0330   RDW 26.3 (H) 06/11/2017 0330   LYMPHSABS 0.8 05/30/2017 0344   MONOABS 1.0 05/30/2017 0344   EOSABS 0.0 05/30/2017 0344   BASOSABS 0.0 05/30/2017 0344   HEPATIC Function Panel Recent Labs    06/05/17 0326 06/06/17 0450 06/10/17 0500  PROT 5.6* 5.5* 6.3*   HEMOGLOBIN A1C No components found for: HGA1C,  MPG CARDIAC ENZYMES No results found for: CKTOTAL, CKMB, CKMBINDEX, TROPONINI BNP No results for input(s): PROBNP in the last 8760 hours. TSH No results for input(s): TSH in the last 8760 hours. CHOLESTEROL No results for input(s): CHOL in the last 8760 hours.  Scheduled Meds: .  chlorhexidine  15 mL Mouth Rinse BID  . Chlorhexidine Gluconate Cloth  6 each Topical Daily  . famotidine  40 mg Per Tube Daily  . feeding supplement (PRO-STAT SUGAR FREE 64)  30 mL Per Tube BID  . free water  250 mL Per Tube Q6H  . insulin aspart  0-15 Units Subcutaneous Q4H  . lip balm  1 application Topical BID  . mouth rinse  15 mL Mouth Rinse q12n4p  . QUEtiapine  25 mg Oral BID  . sodium chloride flush  10-40 mL Intracatheter Q12H  . sodium chloride flush  5 mL Intracatheter Q8H  . vitamin C  500 mg Per Tube Daily   Continuous Infusions: . albumin human Stopped (06/11/17 0518)  . diltiazem (CARDIZEM) infusion 15 mg/hr (06/11/17 0342)  . feeding supplement (VITAL 1.5 CAL) Stopped (06/09/17 2044)  .  piperacillin-tazobactam (ZOSYN)  IV 3.375 g (06/11/17 0537)   PRN Meds:.acetaminophen (TYLENOL) oral liquid 160 mg/5 mL, fentaNYL (SUBLIMAZE) injection, Gerhardt's butt cream, hydrALAZINE, hydrocortisone, hydrocortisone cream, [DISCONTINUED] ondansetron **OR** ondansetron (ZOFRAN) IV, prochlorperazine, sodium chloride flush, sodium chloride flush  Assessment/Plan: Rectosigmoid adenocarcinoma S/P low anterior resection S/P umbilical hernia repair S/P septic shock Aspiration pneumonia Metabolic encephalopathy Intraabdominal abscess Pseudomonas infection Acute respiratory failure with hypoxemia CAD DM, type 2 Acute blood loss anemia Obesity Hypoalbuminemia  Follow with surgery. Tube feeding resumed per surgery. Palliative care consulted.     LOS: 36 days    [name]  MD  06/11/2017, 9:27 AM

## 2017-06-12 LAB — GLUCOSE, CAPILLARY
GLUCOSE-CAPILLARY: 103 mg/dL — AB (ref 65–99)
GLUCOSE-CAPILLARY: 175 mg/dL — AB (ref 65–99)
Glucose-Capillary: 172 mg/dL — ABNORMAL HIGH (ref 65–99)
Glucose-Capillary: 157 mg/dL — ABNORMAL HIGH (ref 65–99)
Glucose-Capillary: 104 mg/dL — ABNORMAL HIGH (ref 65–99)
Glucose-Capillary: 127 mg/dL — ABNORMAL HIGH (ref 65–99)

## 2017-06-12 MED ORDER — FENTANYL CITRATE (PF) 100 MCG/2ML IJ SOLN
50.0000 ug | Freq: Once | INTRAMUSCULAR | Status: AC
  Administered 2017-06-12: 50 ug via INTRAVENOUS

## 2017-06-12 NOTE — Progress Notes (Signed)
  Progress Note: General Surgery Service   Assessment/Plan: Patient Active Problem List   Diagnosis Date Noted  . Goals of care, counseling/discussion   . Palliative care by specialist   . Pressure injury of skin 06/09/2017  . Ascites   . Intra-abdominal abscess ([place])   . Pseudomonas infection   . Acute respiratory failure with hypoxemia ([place])   . Atelectasis   . Septic shock ([place])   . Hypoglycemia after GI (gastrointestinal) surgery   . Colostomy present ([place])   . Ventral hernia s/p primary repair 05/11/2017 05/14/2017  . Physical deconditioning 05/14/2017  . CAD (coronary atherosclerotic disease) 05/14/2017  . Diabetes mellitus without complication ([place])   . Acute blood loss anemia   . 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 Iron deficiency anemia due to chronic blood loss   . Abnormal CT scan, sigmoid colon   . Generalized abdominal pain 05/06/2017  . 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 Aftercare following surgery of the circulatory system, [place] 11/22/2011   s/p Procedure(s): Colon Resection Sigmoid Low Anterior Resection with Staple Colon Relast HERNIA REPAIR UMBILICAL ADULT Creation Vertical Loop ILEOSTOMY 05/11/2017 -replace cortrak tube -restart tube feeds once replaced -recommend mittens once cortrak tube replaced to avoid further dislodgement   LOS: 37 days  Chief Complaint/Subjective: Feeding tube dislodged yesterday, patient would like to hold the calendar on the wall  Objective: Vital signs in last 24 hours: Temp:  [98 F (36.7 C)-98.7 F (37.1 C)] 98.5 F (36.9 C) (06/02 0414) Pulse Rate:  [88-95] 95 (06/02 0414) Resp:  [16-24] 18 (06/02 0414) BP: (146-158)/(68-79) 152/76 (06/02 0414) SpO2:  [95 %] 95 % (06/02 0414) Weight:  [107 kg (236 lb)] 107 kg (236 lb) (06/02 0414) Last BM Date: 06/11/17  Intake/Output from previous day: 06/01 0701 - 06/02 0700 In: 1400 [P.O.:700; I.V.:100; NG/GT:500; IV Piggyback:100] Out: 1340  [Urine:900; Drains:190; Stool:250] Intake/Output this shift: No intake/output data recorded.  Gen: NAD, partially understandable speech  Lungs: patient refusing exam and combative  Cardiovascular: patient refusing exam and combative  Abd: patient refusing exam and combative  Extremities: moves all extremities  Neuro: GCS 14  Studies/Results:  BMP Latest Ref Rng & Units 06/11/2017 06/10/2017 06/09/2017  Glucose 65 - 99 mg/dL 180(H) 173(H) 119(H)  BUN 6 - 20 mg/dL 20 27(H) 29(H)  Creatinine 0.61 - 1.24 mg/dL 1.49(H) 1.64(H) 1.61(H)  Sodium 135 - 145 mmol/L 146(H) 147(H) 148(H)  Potassium 3.5 - 5.1 mmol/L 3.3(L) 3.6 3.2(L)  Chloride 101 - 111 mmol/L 113(H) 116(H) 118(H)  CO2 22 - 32 mmol/L 23 24 24   Calcium 8.9 - 10.3 mg/dL 8.1(L) 7.8(L) 8.1(L)     Mickeal Skinner, MD [facility]

## 2017-06-12 NOTE — Progress Notes (Signed)
Ref: Charolette Forward, MD   Subjective:  Pulled out feeding tube. Afebrile. Pus in JP drain. Increasing confusion per nurse.  Objective:  Vital Signs in the last 24 hours: Temp:  [98 F (36.7 C)-98.7 F (37.1 C)] 98.5 F (36.9 C) (06/02 0414) Pulse Rate:  [88-95] 95 (06/02 0414) Cardiac Rhythm: Heart block (06/02 0700) Resp:  [16-24] 18 (06/02 0414) BP: (146-158)/(68-79) 152/76 (06/02 0414) SpO2:  [95 %] 95 % (06/02 0414) Weight:  [107 kg (236 lb)] 107 kg (236 lb) (06/02 0414)  Physical Exam: BP Readings from Last 1 Encounters:  06/12/17 (!) 152/76     Wt Readings from Last 1 Encounters:  06/12/17 107 kg (236 lb)    Weight change: -9.526 kg (-21 lb) Body mass index is 33.86 kg/m. HEENT: [place]/AT, Eyes-Brown, Conjunctiva-Pale, Sclera-Non-icteric Neck: No JVD, No bruit, Trachea midline. Lungs:  Clearing, Bilateral. Cardiac:  Regular rhythm, normal S1 and S2, no S3. II/VI systolic murmur. Abdomen:  Soft, non-tender. BS present. Extremities:  2 + edema present. No cyanosis. No clubbing. CNS: AxOx1, Cranial nerves grossly intact, moves all 4 extremities.  Skin: Warm and dry.   Intake/Output from previous day: 06/01 0701 - 06/02 0700 In: 1400 [P.O.:700; I.V.:100; NG/GT:500; IV Piggyback:100] Out: 1340 [Urine:900; Drains:190; Stool:250]    Lab Results: BMET    Component Value Date/Time   NA 146 (H) 06/11/2017 0330   NA 147 (H) 06/10/2017 0500   NA 148 (H) 06/09/2017 2334   K 3.3 (L) 06/11/2017 0330   K 3.6 06/10/2017 0500   K 3.2 (L) 06/09/2017 2334   CL 113 (H) 06/11/2017 0330   CL 116 (H) 06/10/2017 0500   CL 118 (H) 06/09/2017 2334   CO2 23 06/11/2017 0330   CO2 24 06/10/2017 0500   CO2 24 06/09/2017 2334   GLUCOSE 180 (H) 06/11/2017 0330   GLUCOSE 173 (H) 06/10/2017 0500   GLUCOSE 119 (H) 06/09/2017 2334   BUN 20 06/11/2017 0330   BUN 27 (H) 06/10/2017 0500   BUN 29 (H) 06/09/2017 2334   CREATININE 1.49 (H) 06/11/2017 0330   CREATININE 1.64 (H) 06/10/2017  0500   CREATININE 1.61 (H) 06/09/2017 2334   CALCIUM 8.1 (L) 06/11/2017 0330   CALCIUM 7.8 (L) 06/10/2017 0500   CALCIUM 8.1 (L) 06/09/2017 2334   GFRNONAA 45 (L) 06/11/2017 0330   GFRNONAA 40 (L) 06/10/2017 0500   GFRNONAA 41 (L) 06/09/2017 2334   GFRAA 52 (L) 06/11/2017 0330   GFRAA 47 (L) 06/10/2017 0500   GFRAA 48 (L) 06/09/2017 2334   CBC    Component Value Date/Time   WBC 6.0 06/11/2017 0330   RBC 2.88 (L) 06/11/2017 0330   HGB 7.4 (L) 06/11/2017 0330   HCT 24.8 (L) 06/11/2017 0330   PLT 181 06/11/2017 0330   MCV 86.1 06/11/2017 0330   MCH 25.7 (L) 06/11/2017 0330   MCHC 29.8 (L) 06/11/2017 0330   RDW 26.3 (H) 06/11/2017 0330   LYMPHSABS 0.8 05/30/2017 0344   MONOABS 1.0 05/30/2017 0344   EOSABS 0.0 05/30/2017 0344   BASOSABS 0.0 05/30/2017 0344   HEPATIC Function Panel Recent Labs    06/05/17 0326 06/06/17 0450 06/10/17 0500  PROT 5.6* 5.5* 6.3*   HEMOGLOBIN A1C No components found for: HGA1C,  MPG CARDIAC ENZYMES No results found for: CKTOTAL, CKMB, CKMBINDEX, TROPONINI BNP No results for input(s): PROBNP in the last 8760 hours. TSH No results for input(s): TSH in the last 8760 hours. CHOLESTEROL No results for input(s): CHOL in the  last 8760 hours.  Scheduled Meds: . chlorhexidine  15 mL Mouth Rinse BID  . Chlorhexidine Gluconate Cloth  6 each Topical Daily  . famotidine  40 mg Per Tube Daily  . feeding supplement (PRO-STAT SUGAR FREE 64)  30 mL Per Tube BID  . free water  250 mL Per Tube Q6H  . insulin aspart  0-15 Units Subcutaneous Q4H  . lip balm  1 application Topical BID  . mouth rinse  15 mL Mouth Rinse q12n4p  . QUEtiapine  25 mg Oral BID  . sodium chloride flush  10-40 mL Intracatheter Q12H  . sodium chloride flush  5 mL Intracatheter Q8H  . vitamin C  500 mg Per Tube Daily   Continuous Infusions: . diltiazem (CARDIZEM) infusion 5 mg/hr (06/12/17 0230)  . feeding supplement (VITAL 1.5 CAL) 1,000 mL (06/11/17 1500)  .  piperacillin-tazobactam (ZOSYN)  IV 3.375 g (06/12/17 0603)   PRN Meds:.acetaminophen (TYLENOL) oral liquid 160 mg/5 mL, fentaNYL (SUBLIMAZE) injection, Gerhardt's butt cream, hydrALAZINE, hydrocortisone, hydrocortisone cream, [DISCONTINUED] ondansetron **OR** ondansetron (ZOFRAN) IV, prochlorperazine, sodium chloride flush, sodium chloride flush  Assessment/Plan: Rectosigmoid adenocarcinoma S/P low anterior resection for sigmoid colon cancer. S/P Umbilical hernia repair S/P septic shock Aspiration pneumia Metabolic encephalopathy Intra-abdominal abscess Pseudomonas infection CAD Type 2 DM Acute blood loss anemia Obesity Hypoalbuminemia  Tube feeding per surgery.   LOS: 37 days    [name]  MD  06/12/2017, 10:49 AM

## 2017-06-13 LAB — GLUCOSE, CAPILLARY
GLUCOSE-CAPILLARY: 118 mg/dL — AB (ref 65–99)
GLUCOSE-CAPILLARY: 120 mg/dL — AB (ref 65–99)
Glucose-Capillary: 147 mg/dL — ABNORMAL HIGH (ref 65–99)
Glucose-Capillary: 145 mg/dL — ABNORMAL HIGH (ref 65–99)
Glucose-Capillary: 161 mg/dL — ABNORMAL HIGH (ref 65–99)

## 2017-06-13 MED ORDER — VITAL 1.5 CAL PO LIQD
1000.0000 mL | ORAL | Status: DC
  Administered 2017-06-13: 1000 mL
  Filled 2017-06-13 (×4): qty 1000

## 2017-06-13 MED ORDER — COLLAGENASE 250 UNIT/GM EX OINT
TOPICAL_OINTMENT | Freq: Every day | CUTANEOUS | Status: AC
  Administered 2017-06-13 – 2017-06-18 (×6): via TOPICAL
  Administered 2017-06-18: 1 via TOPICAL
  Administered 2017-06-19 – 2017-06-21 (×3): via TOPICAL
  Filled 2017-06-13 (×2): qty 30

## 2017-06-13 NOTE — Progress Notes (Signed)
Cortrak Tube Team Note:  Consult received to place a Cortrak feeding tube.   A 10 F Cortrak tube was placed in the L nare and secured with a nasal bridle at 100 cm. Per the Cortrak monitor reading the tube tip is post-pyloric.   No x-ray is required. RN may begin using tube.    If the tube becomes dislodged please keep the tube and contact the Cortrak team at [email or web address] (password [email or web address]) for replacement.  If after hours and replacement cannot be delayed, place a NG tube and confirm placement with an abdominal x-ray.      Jarome Matin, MS, RD, LDN, [facility] Inpatient Clinical Dietitian Pager # [phone] After hours/weekend pager # [phone]

## 2017-06-13 NOTE — Plan of Care (Signed)
  Problem: Education: Goal: Knowledge of General Education information will improve Outcome: Not Progressing   Problem: Health Behavior/Discharge Planning: Goal: Ability to manage health-related needs will improve Outcome: Not Progressing   

## 2017-06-13 NOTE — Progress Notes (Signed)
SLP Cancellation Note  Patient Details Name: Jesse Le MRN: [id number] DOB: 1945-06-20   Cancelled treatment:        Patient is not ready for PO trials at this time. TF haven't been restarted as well. Will follow up with patient's progress at next available time.    Charlynne Cousins Johnathen Testa, MA, CCC-SLP 06/13/2017 9:38 AM

## 2017-06-13 NOTE — Consult Note (Addendum)
[place] Nurse wound follow up Wound type: surgical  Measurement: 10cm x 9cm x 1cm with tunnel at 4 o'clock that is 3cm and undermining from 6-8 o'clock that is 2.5cm  Wound bed:90% pink with visible sutures and 10% slough noted on the right lateral edge at 8 o'clock  Drainage (amount, consistency, odor) serosanguinous in canister  Periwound: some healing medical related skin damage from tape and pouch tape border Dressing procedure/placement/frequency: 2 piece of white foam used (1) in each area (tunnel) and undermining 1 piece of Mepitel used over wound bed 1 pc of white foam used to cover wound bed 1pc of black foam used to fill remainder of the wound bed. Sealed at 181mmHG. Patient received IV pain medication prior to dressing change.   [place] Nurse ostomy follow up Stoma type/location: RLQ, loop diverting ileostomy  Stomal assessment/size: 1" slightly oval shaped stoma, budded  Peristomal assessment: some MARSI from tape border Treatment options for stomal/peristomal skin: using 2" barrier ring around stoma to aid in seal Output liquid green Ostomy pouching: 2pc. 2 3/4" with 2" barrier ring Education provided: NA, patient is restrained and not able to work with [place] nurse at this time Enrolled patient in [employer] Discharge program: No  WOC Nurse wound follow up Wound type: sacral Unstageable pressure injury Measurement:  9cm x 7cm x 0.1cm  Wound bed:30% soft black/yellow non viable tissue/70% pink, superficial skin loss Drainage (amount, consistency, odor) moderate, serosanguinous, no odor Periwound: intact  Dressing procedure/placement/frequency: Add enzymatic debridement today, will reassess later in the week if slough is not decreasing may need hydrotherapy. Cover with moist saline gauze, secure with tape. Change daily.  LALM in place for moisture management and pressure redistribution Cortrax in for feedings, however patient has pulled this out recently. To be replaced  today   Follow up on MASD from condom catheter, he has thick yellow drainage under uncircumcised foreskin. The open areas do not appear to be acutely worse than what was documented by my partner on 06/03/17.  Has some scrotal MASD on the posterior surface as well.  Will add moisture barrier cream for this.  [place] Nurse team will follow along with you for wound assessments and NPWT dressing changes M/W/F and ostomy care.  Please notify me of any acute changes in the wounds or any new areas of concerns [place] MSN, RN,CWOCN, [place], [place]

## 2017-06-13 NOTE — Progress Notes (Signed)
Subjective:  Awake, no complaints, Tube feeding has not been started yet  Objective:  Vital Signs in the last 24 hours: Temp:  [97.8 F (36.6 C)-98.4 F (36.9 C)] 97.9 F (36.6 C) (06/03 0837) Pulse Rate:  [79-98] 86 (06/03 0900) Resp:  [15-25] 18 (06/03 0900) BP: (142-168)/(73-144) 160/73 (06/03 0839) SpO2:  [98 %-100 %] 100 % (06/03 0900) Weight:  [102.5 kg (226 lb)] 102.5 kg (226 lb) (06/03 0359)  Intake/Output from previous day: 06/02 0701 - 06/03 0700 In: 214.8 [I.V.:109.8; IV Piggyback:100] Out: 1200 [Urine:825; Drains:175; Stool:200] Intake/Output from this shift: No intake/output data recorded.  Physical Exam: Neck: no adenopathy, no carotid bruit, no JVD and supple, symmetrical, trachea midline Lungs: clear anteriorly Heart: regular rate and rhythm, S1, S2 normal and 2/6 systolic murmur noted Abdomen: distended.  Bowel sounds absent, ileostomy bag and wound VAC noted.  J-P bulb purulent fluid noted Extremities: no clubbing, cyanosis, 2+ edema noted  Lab Results: Recent Labs    06/11/17 0330  WBC 6.0  HGB 7.4*  PLT 181   Recent Labs    06/11/17 0330  NA 146*  K 3.3*  CL 113*  CO2 23  GLUCOSE 180*  BUN 20  CREATININE 1.49*   No results for input(s): TROPONINI in the last 72 hours.  Invalid input(s): CK, MB Hepatic Function Panel No results for input(s): PROT, ALBUMIN, AST, ALT, ALKPHOS, BILITOT, BILIDIR, IBILI in the last 72 hours. No results for input(s): CHOL in the last 72 hours. No results for input(s): PROTIME in the last 72 hours.  Imaging: Imaging results have been reviewed and No results found.  Cardiac Studies:  Assessment/Plan:  Rectosigmoid adenocarcinoma status post resection with primary colorectal anastomosis anddiverting loop ileostomy and umbilical hernia repair Resolving aspiration pneumonia Spontaneous retroperitoneal hematoma Abdominalsurgical wound infectionwith bowel exposed. Metabolic encephalopathy Status  postSeptichypotensive shock secondary Intra-abdominal sepsis/possible abscess/persistent anastomosis leak Acute on chronic renal injury.improved Status post   paroxysmal A. 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

## 2017-06-13 NOTE — Progress Notes (Addendum)
Nutrition Follow-up  DOCUMENTATION CODES:   Not applicable  INTERVENTION:    Vital 1.5 formula at 55 ml/hr with 30 ml Prostat liquid protein BID  Provides 2180 kcals, 119 gm protein, 1008 ml of free water daily  NUTRITION DIAGNOSIS:   Increased nutrient needs related to chronic illness(colon cancer) as evidenced by estimated needs, ongoing  GOAL:   Patient will meet greater than or equal to 90% of their needs, progressing  MONITOR:   TF tolerance, Diet advancement, PO intake, Labs, Skin, Weight trends, I & O's  ASSESSMENT:   72 year old male here with iron deficiency anemia with recent rectal bleeding found to have abnormal CT scan of the abdomen and pelvis suggestive of a sigmoid cancer.  He has a history of peripheral vascular disease, aortic aneurysm with endovascular repair, CAD, hypertension, hyperlipidemia, COPD, CKD.  5/22 pt re-intubated, Cortrak inserted tip in stomach 5/23 spoke with Hulen Skains, ok to start TF 5/24 TPN weaned and d/c 5/29 wound VAC placed  Pt self extubated 5/28. S/p bedside swallow evaluation 5/30. SLP follow up today. Pt not ready for PO trials. New Cortrak tube placed today. Tip is post pyloric. Pt pulled out his previous tube.  Palliative Medicine Team following. Note 5/31 reviewed. Labs and medications reviewed. CBG's [id number].  Diet Order:   Diet Order           Diet NPO time specified  Diet effective now         EDUCATION NEEDS:   Not appropriate for education at this time  Skin:  Skin Assessment: Skin Integrity Issues: Skin Integrity Issues:: Other (Comment) Unstageable: sacral pressure injury Wound Vac: to abdomen for full thickness post-op wound  Last BM:  6/3 ileostomy  Height:   Ht Readings from Last 1 Encounters:  06/01/17 5\' 10"  (1.778 m)   Weight:   Wt Readings from Last 1 Encounters:  06/13/17 226 lb (102.5 kg)    Intake/Output Summary (Last 24 hours) at 06/13/2017 1450 Last data filed at 06/13/2017  1230 Gross per 24 hour  Intake 214.75 ml  Output 1850 ml  Net -1635.25 ml   Ideal Body Weight:  83.6 kg  BMI:  Body mass index is 32.43 kg/m.  Estimated Nutritional Needs:   Kcal:  2150-2350  Protein:  110-125 gm  Fluid:  2.1-2.3 L  Arthur Holms, RD, LDN Pager #: [phone] After-Hours Pager #: [phone]

## 2017-06-13 NOTE — Progress Notes (Signed)
[facility]/Trauma Progress Note  33 Days Post-Op   Assessment/Plan Principal Problem:   Cancer of sigmoid s/p LAR rectosigmoid resection 05/11/2017 Active Problems:   Generalized abdominal pain   Iron deficiency anemia due to chronic blood loss   Abnormal CT scan, sigmoid colon   Acute blood loss anemia   Ventral hernia s/p primary repair 05/11/2017   Physical deconditioning   Diabetes mellitus without complication (HCC)   CAD (coronary atherosclerotic disease)   Septic shock (HCC)   Hypoglycemia after GI (gastrointestinal) surgery   Colostomy present (HCC)   Acute respiratory failure with hypoxemia (HCC)   Atelectasis   Ascites   Intra-abdominal abscess (HCC)   Pseudomonas infection   Pressure injury of skin   Goals of care, counseling/discussion   Palliative care by specialist  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 - S/P Low anterior resection with primary colorectal anastomosis, diverting loop ileostomy, rigid proctoscopy, umbilical hernia repair 03/12/9516 Dr. Donnie Mesa,  -Postop anastomotic leak  - IR drain 05/26/17 -Wound dehiscence/possible exposed bowel at the base of the wound.  -Wound VAC placement 06/08/2017  FEN: cortrak, TF VTE: per medicine ID: Zosyn 05/10>> Foley: yes Follow up: Dr. Georgette Dover  DISPO: continue wound vac, resume TF once cortrak is replaced.     LOS: 38 days    Subjective: CC: no complaints at this time  No abdominal pain. No family at bedside.   Objective: Vital signs in last 24 hours: Temp:  [97.8 F (36.6 C)-98.4 F (36.9 C)] 97.9 F (36.6 C) (06/03 0837) Pulse Rate:  [79-98] 86 (06/03 0900) Resp:  [15-25] 18 (06/03 0900) BP: (142-168)/(73-144) 160/73 (06/03 0839) SpO2:  [98 %-100 %] 100 % (06/03 0900) Weight:  [102.5 kg (226 lb)] 102.5 kg (226 lb) (06/03 0359) Last BM Date: 06/13/17  Intake/Output from previous day: 06/02 0701 - 06/03 0700 In: 214.8 [I.V.:109.8; IV  Piggyback:100] Out: 1200 [Urine:825; Drains:175; Stool:200] Intake/Output this shift: No intake/output data recorded.  PE: Gen:  Alert, NAD Pulm:  Rate and effort normal Abd: Soft, NT/ND, +BS, ileostomy and wound see photos below Skin: warm and dry        Anti-infectives: Anti-infectives (From admission, onward)   Start     Dose/Rate Route Frequency Ordered Stop   05/28/17 1200  vancomycin (VANCOCIN) 1,250 mg in sodium chloride 0.9 % 250 mL IVPB  Status:  Discontinued     1,250 mg 166.7 mL/hr over 90 Minutes Intravenous Every 24 hours 05/27/17 1324 05/30/17 1615   05/27/17 1100  vancomycin (VANCOCIN) 1,250 mg in sodium chloride 0.9 % 250 mL IVPB  Status:  Discontinued     1,250 mg 166.7 mL/hr over 90 Minutes Intravenous Every 12 hours 05/27/17 1011 05/27/17 1324   05/20/17 2230  vancomycin (VANCOCIN) 1,250 mg in sodium chloride 0.9 % 250 mL IVPB  Status:  Discontinued     1,250 mg 166.7 mL/hr over 90 Minutes Intravenous Every 24 hours 05/20/17 2151 05/27/17 1011   05/20/17 1400  piperacillin-tazobactam (ZOSYN) IVPB 3.375 g     3.375 g 12.5 mL/hr over 240 Minutes Intravenous Every 8 hours 05/20/17 1044     05/20/17 0500  anidulafungin (ERAXIS) 100 mg in sodium chloride 0.9 % 100 mL IVPB  Status:  Discontinued     100 mg 78 mL/hr over 100 Minutes Intravenous Every 24 hours 05/19/17 0116 05/29/17 1214   05/19/17 2200  vancomycin (VANCOCIN) 1,500 mg in sodium chloride 0.9 % 500 mL IVPB  Status:  Discontinued  1,500 mg 250 mL/hr over 120 Minutes Intravenous Every 24 hours 05/18/17 2126 05/20/17 2150   05/19/17 0130  anidulafungin (ERAXIS) 200 mg in sodium chloride 0.9 % 200 mL IVPB     200 mg 78 mL/hr over 200 Minutes Intravenous  Once 05/19/17 0116 05/19/17 0520   05/19/17 0130  Ampicillin-Sulbactam (UNASYN) 3 g in sodium chloride 0.9 % 100 mL IVPB  Status:  Discontinued     3 g 200 mL/hr over 30 Minutes Intravenous Every 8 hours 05/19/17 0118 05/20/17 1044   05/18/17 2200   vancomycin (VANCOCIN) 1,750 mg in sodium chloride 0.9 % 500 mL IVPB     1,750 mg 250 mL/hr over 120 Minutes Intravenous  Once 05/18/17 2126 05/19/17 0400   05/17/17 0930  piperacillin-tazobactam (ZOSYN) IVPB 3.375 g  Status:  Discontinued     3.375 g 12.5 mL/hr over 240 Minutes Intravenous Every 8 hours 05/17/17 0836 05/19/17 0118   05/11/17 2200  cefoTEtan (CEFOTAN) 2 g in sodium chloride 0.9 % 100 mL IVPB     2 g 200 mL/hr over 30 Minutes Intravenous Every 12 hours 05/11/17 1839 05/11/17 2034   05/11/17 0600  cefoTEtan in Dextrose 5% (CEFOTAN) IVPB 2 g     2 g Intravenous On call to O.R. 05/10/17 1142 05/11/17 1416      Lab Results:  Recent Labs    06/11/17 0330  WBC 6.0  HGB 7.4*  HCT 24.8*  PLT 181   BMET Recent Labs    06/11/17 0330  NA 146*  K 3.3*  CL 113*  CO2 23  GLUCOSE 180*  BUN 20  CREATININE 1.49*  CALCIUM 8.1*   PT/INR No results for input(s): LABPROT, INR in the last 72 hours. CMP     Component Value Date/Time   NA 146 (H) 06/11/2017 0330   K 3.3 (L) 06/11/2017 0330   CL 113 (H) 06/11/2017 0330   CO2 23 06/11/2017 0330   GLUCOSE 180 (H) 06/11/2017 0330   BUN 20 06/11/2017 0330   CREATININE 1.49 (H) 06/11/2017 0330   CALCIUM 8.1 (L) 06/11/2017 0330   PROT 6.3 (L) 06/10/2017 0500   ALBUMIN 2.0 (L) 06/10/2017 0500   AST 35 06/10/2017 0500   ALT 25 06/10/2017 0500   ALKPHOS 128 (H) 06/10/2017 0500   BILITOT 1.8 (H) 06/10/2017 0500   GFRNONAA 45 (L) 06/11/2017 0330   GFRAA 52 (L) 06/11/2017 0330   Lipase     Component Value Date/Time   LIPASE 22 05/06/2017 1036    Studies/Results: No results found.    Kalman Drape , [facility] Surgery 06/13/2017, 9:51 AM  Pager: [phone] Mon-Wed, Friday 7:00am-4:30pm Thurs 7am-11:30am  Consults: [phone]

## 2017-06-14 LAB — GLUCOSE, CAPILLARY
GLUCOSE-CAPILLARY: 152 mg/dL — AB (ref 65–99)
GLUCOSE-CAPILLARY: 155 mg/dL — AB (ref 65–99)
GLUCOSE-CAPILLARY: 174 mg/dL — AB (ref 65–99)
GLUCOSE-CAPILLARY: 205 mg/dL — AB (ref 65–99)
Glucose-Capillary: 161 mg/dL — ABNORMAL HIGH (ref 65–99)
Glucose-Capillary: 149 mg/dL — ABNORMAL HIGH (ref 65–99)

## 2017-06-14 LAB — BASIC METABOLIC PANEL
ANION GAP: 7 (ref 5–15)
BUN: 16 mg/dL (ref 6–20)
CALCIUM: 8.2 mg/dL — AB (ref 8.9–10.3)
CO2: 24 mmol/L (ref 22–32)
Chloride: 118 mmol/L — ABNORMAL HIGH (ref 101–111)
Creatinine, Ser: 1.61 mg/dL — ABNORMAL HIGH (ref 0.61–1.24)
GFR calc Af Amer: 48 mL/min — ABNORMAL LOW (ref >60)
GFR, EST NON AFRICAN AMERICAN: 41 mL/min — AB (ref >60)
GLUCOSE: 206 mg/dL — AB (ref 65–99)
POTASSIUM: 2.9 mmol/L — AB (ref 3.5–5.1)
SODIUM: 149 mmol/L — AB (ref 135–145)

## 2017-06-14 MED ORDER — POTASSIUM CHLORIDE 10 MEQ/100ML IV SOLN
10.0000 meq | INTRAVENOUS | Status: AC
  Administered 2017-06-14: 10 meq via INTRAVENOUS
  Filled 2017-06-14 (×2): qty 100

## 2017-06-14 MED ORDER — KCL IN DEXTROSE-NACL 10-5-0.45 MEQ/L-%-% IV SOLN
INTRAVENOUS | Status: DC
  Administered 2017-06-14 – 2017-06-20 (×5): via INTRAVENOUS
  Administered 2017-06-20: 50 mL/h via INTRAVENOUS
  Filled 2017-06-14 (×10): qty 1000

## 2017-06-14 MED ORDER — VITAL 1.5 CAL PO LIQD
1000.0000 mL | ORAL | Status: DC
  Administered 2017-06-14 (×2): 1000 mL
  Filled 2017-06-14 (×3): qty 1000

## 2017-06-14 NOTE — Progress Notes (Signed)
Chart reviewed. Palliative phone conference was schedule with step-daughter (HCPOA) Mia Staton at 3:30pm. Phone call attempted x2. Mia's phone is turned off. Voicemail left. Mia works two jobs and is unable to meet with me in person. Will attempt f/u conversation tomorrow afternoon.   NO CHARGE  Ihor Dow, FNP-C Palliative Medicine Team  Phone: [phone] Fax: [phone]

## 2017-06-14 NOTE — Progress Notes (Signed)
Chief Complaint: Patient was seen today for follow up abdominal abscess drain  Referring Physician(s): CCS  Supervising Physician: Daryll Brod  Patient Status: [facility] - In-pt  Subjective: Remains extubated on room air.  Confused and conversant.    Objective: Physical Exam: BP 128/87 (BP Location: Right Arm)   Pulse (!) 104   Temp 98.3 F (36.8 C) (Oral)   Resp 17   Ht 5\' 10"  (1.778 m)   Wt 229 lb (103.9 kg)   SpO2 96%   BMI 32.86 kg/m  Abdomen:  RUQ drain intact, site clean. Output remains cloudy, pale yellow. 125 mL recorded yesterday.    Current Facility-Administered Medications:  .  acetaminophen (TYLENOL) solution 650 mg, 650 mg, Oral, Q8H PRN, Greer Pickerel, MD, 650 mg at 06/03/17 1954 .  chlorhexidine (PERIDEX) 0.12 % solution 15 mL, 15 mL, Mouth Rinse, BID, Harwani, Mohan, MD, 15 mL at 06/14/17 1121 .  Chlorhexidine Gluconate Cloth 2 % PADS 6 each, 6 each, Topical, Daily, Charolette Forward, MD, 6 each at 06/13/17 1100 .  collagenase (SANTYL) ointment, , Topical, Daily, Harwani, Mohan, MD .  dextrose 5 % and 0.45 % NaCl with KCl 10 mEq/L infusion, , Intravenous, Continuous, Harwani, Mohan, MD .  diltiazem (CARDIZEM) 100 mg in dextrose 5% 12mL (1 mg/mL) infusion, 5-15 mg/hr, Intravenous, Titrated, Sood, Vineet, MD, Last Rate: 5 mL/hr at 06/14/17 1122, 5 mg/hr at 06/14/17 1122 .  famotidine (PEPCID) 40 MG/5ML suspension 40 mg, 40 mg, Per Tube, Daily, Alvira Philips, [place], 40 mg at 06/14/17 1129 .  feeding supplement (PRO-STAT SUGAR FREE 64) liquid 30 mL, 30 mL, Per Tube, BID, Rush Farmer, MD, 30 mL at 06/14/17 1122 .  feeding supplement (VITAL 1.5 CAL) liquid 1,000 mL, 1,000 mL, Per Tube, Continuous, Focht, Jessica L, PA .  fentaNYL (SUBLIMAZE) injection 50 mcg, 50 mcg, Intravenous, Q4H PRN, Jennelle Human B, NP, 50 mcg at 06/13/17 1512 .  free water 250 mL, 250 mL, Per Tube, Q6H, Rush Farmer, MD, 250 mL at 06/14/17 0447 .  Gerhardt's butt cream, , Topical,  PRN, Charolette Forward, MD .  hydrocortisone (ANUSOL-HC) 2.5 % rectal cream 1 application, 1 application, Topical, QID PRN, Michael Boston, MD .  hydrocortisone cream 1 % 1 application, 1 application, Topical, TID PRN, Michael Boston, MD .  insulin aspart (novoLOG) injection 0-15 Units, 0-15 Units, Subcutaneous, Q4H, Juanito Doom, MD, 3 Units at 06/14/17 [id number] .  lip balm (CARMEX) ointment 1 application, 1 application, Topical, BID, Michael Boston, MD, 1 application at 72/53/66 0800 .  MEDLINE mouth rinse, 15 mL, Mouth Rinse, q12n4p, Harwani, Mohan, MD, 15 mL at 06/13/17 1730 .  [DISCONTINUED] ondansetron (ZOFRAN-ODT) disintegrating tablet 4 mg, 4 mg, Oral, Q6H PRN **OR** ondansetron (ZOFRAN) injection 4 mg, 4 mg, Intravenous, Q6H PRN, Donnie Mesa, MD, 4 mg at 06/04/17 1938 .  piperacillin-tazobactam (ZOSYN) IVPB 3.375 g, 3.375 g, Intravenous, Q8H, Charolette Forward, MD, Stopped at 06/14/17 0935 .  prochlorperazine (COMPAZINE) injection 5-10 mg, 5-10 mg, Intravenous, Q4H PRN, Michael Boston, MD .  QUEtiapine (SEROQUEL) tablet 25 mg, 25 mg, Oral, BID, Rush Farmer, MD, 25 mg at 06/14/17 1122 .  sodium chloride flush (NS) 0.9 % injection 10-40 mL, 10-40 mL, Intracatheter, PRN, Charolette Forward, MD, 10 mL at 06/03/17 2112 .  sodium chloride flush (NS) 0.9 % injection 10-40 mL, 10-40 mL, Intracatheter, Q12H, Charolette Forward, MD, 10 mL at 06/13/17 2219 .  sodium chloride flush (NS) 0.9 % injection 10-40 mL, 10-40  mL, Intracatheter, PRN, Charolette Forward, MD .  sodium chloride flush (NS) 0.9 % injection 5 mL, 5 mL, Intracatheter, Q8H, Sandi Mariscal, MD, 5 mL at 06/13/17 2219 .  vitamin C (ASCORBIC ACID) tablet 500 mg, 500 mg, Per Tube, Daily, Alvira Philips, RPH, 500 mg at 06/14/17 1122  Labs: CBC No results for input(s): WBC, HGB, HCT, PLT in the last 72 hours. BMET Recent Labs    06/14/17 0512  NA 149*  K 2.9*  CL 118*  CO2 24  GLUCOSE 206*  BUN 16  CREATININE 1.61*  CALCIUM 8.2*   LFT No  results for input(s): PROT, ALBUMIN, AST, ALT, ALKPHOS, BILITOT, BILIDIR, IBILI, LIPASE in the last 72 hours. PT/INR No results for input(s): LABPROT, INR in the last 72 hours.   Studies/Results: No results found.  Assessment/Plan: S/p perc drain 05/26/17 Stable fluid collections noted on most recent CT scan with increased output from RUQ drain.  Patient nearing stability for possible discharge.  Will need drain to remain in place.  Will place orders for schedulers to assist in arranging follow-up.  Continue current management for now.    LOS: 39 days   I spent a total of 15 minutes in face to face in clinical consultation, greater than 50% of which was counseling/coordinating care for abdominal abscess  Docia Barrier PA-C 06/14/2017 12:59 PM

## 2017-06-14 NOTE — Progress Notes (Signed)
[facility]/Trauma Progress Note  34 Days Post-Op   Assessment/Plan Principal Problem:   Cancer of sigmoid s/p LAR rectosigmoid resection 05/11/2017 Active Problems:   Generalized abdominal pain   Iron deficiency anemia due to chronic blood loss   Abnormal CT scan, sigmoid colon   Acute blood loss anemia   Ventral hernia s/p primary repair 05/11/2017   Physical deconditioning   Diabetes mellitus without complication (HCC)   CAD (coronary atherosclerotic disease)   Septic shock (HCC)   Hypoglycemia after GI (gastrointestinal) surgery   Colostomy present (HCC)   Acute respiratory failure with hypoxemia (HCC)   Atelectasis   Ascites   Intra-abdominal abscess (HCC)   Pseudomonas infection   Pressure injury of skin   Goals of care, counseling/discussion   Palliative care by specialist  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 - S/P Low anterior resection with primary colorectal anastomosis, diverting loop ileostomy, rigid proctoscopy, umbilical hernia repair 03/17/6281 Dr. Donnie Mesa, -Postop anastomotic leak - IR drain 05/26/17 -Wound dehiscence/possible exposed bowel at the base of the wound. - continue Wound VAC M/W/F - CT 05/31 showed decrease in size of superior and perihepatic fluid collections, inferior aspect unchanged, stable fluid collections along anterior abdominal wall and LLQ. IR following for RUQ drain.   FEN: cortrak, TF VTE: per medicine ID: Zosyn 05/10>> Foley: yes Follow up: Dr. Georgette Dover  DISPO: continue wound vac, resume TF slowly today 10mL/hr with episode of vomiting today. PT recommending SNF Pt is okay from a surgical standpoint to be discharge to SNF when medically ready per cardiology. Pt will need to be discharge on TF, with drain, and with wound vac. I have placed a social worked consult to begin SNF placement. Ideally speech would reevaluate pt prior to discharge in hopes that he could be placed on a diet.      LOS: 39 days    Subjective: CC: chest pain this am that has resolved  Pt states he is no longer having chest pain. He is cold. He knows it is 2019 and who he is. No family at bedside. Nurse states pt vomited this am. She is holding TF.   Objective: Vital signs in last 24 hours: Temp:  [97.2 F (36.2 C)-98.4 F (36.9 C)] 97.2 F (36.2 C) (06/04 0400) Pulse Rate:  [79-112] 112 (06/04 0824) Resp:  [16-27] 25 (06/04 0824) BP: (123-168)/(73-144) 129/108 (06/04 0400) SpO2:  [93 %-100 %] 93 % (06/04 0824) Weight:  [103.9 kg (229 lb)] 103.9 kg (229 lb) (06/04 0400) Last BM Date: 06/13/17  Intake/Output from previous day: 06/03 0701 - 06/04 0700 In: 4155 [I.V.:130; TD/VV:6160; IV Piggyback:50] Out: 7371 [Urine:650; Drains:125; GGYIR:4854] Intake/Output this shift: No intake/output data recorded.  PE: Gen:  Alert, NAD Pulm:  Rate and effort normal Abd: Soft, NT/ND, +BS, ileostomy with brown liquid stool in bag, midline wound with vac in place, RUQ drain with yellow purulent drainge Neuro: follows commands, alert and oriented Skin: warm and dry   Anti-infectives: Anti-infectives (From admission, onward)   Start     Dose/Rate Route Frequency Ordered Stop   05/28/17 1200  vancomycin (VANCOCIN) 1,250 mg in sodium chloride 0.9 % 250 mL IVPB  Status:  Discontinued     1,250 mg 166.7 mL/hr over 90 Minutes Intravenous Every 24 hours 05/27/17 1324 05/30/17 1615   05/27/17 1100  vancomycin (VANCOCIN) 1,250 mg in sodium chloride 0.9 % 250 mL IVPB  Status:  Discontinued     1,250 mg 166.7 mL/hr over 90 Minutes  Intravenous Every 12 hours 05/27/17 1011 05/27/17 1324   05/20/17 2230  vancomycin (VANCOCIN) 1,250 mg in sodium chloride 0.9 % 250 mL IVPB  Status:  Discontinued     1,250 mg 166.7 mL/hr over 90 Minutes Intravenous Every 24 hours 05/20/17 2151 05/27/17 1011   05/20/17 1400  piperacillin-tazobactam (ZOSYN) IVPB 3.375 g     3.375 g 12.5 mL/hr over 240 Minutes Intravenous Every  8 hours 05/20/17 1044     05/20/17 0500  anidulafungin (ERAXIS) 100 mg in sodium chloride 0.9 % 100 mL IVPB  Status:  Discontinued     100 mg 78 mL/hr over 100 Minutes Intravenous Every 24 hours 05/19/17 0116 05/29/17 1214   05/19/17 2200  vancomycin (VANCOCIN) 1,500 mg in sodium chloride 0.9 % 500 mL IVPB  Status:  Discontinued     1,500 mg 250 mL/hr over 120 Minutes Intravenous Every 24 hours 05/18/17 2126 05/20/17 2150   05/19/17 0130  anidulafungin (ERAXIS) 200 mg in sodium chloride 0.9 % 200 mL IVPB     200 mg 78 mL/hr over 200 Minutes Intravenous  Once 05/19/17 0116 05/19/17 0520   05/19/17 0130  Ampicillin-Sulbactam (UNASYN) 3 g in sodium chloride 0.9 % 100 mL IVPB  Status:  Discontinued     3 g 200 mL/hr over 30 Minutes Intravenous Every 8 hours 05/19/17 0118 05/20/17 1044   05/18/17 2200  vancomycin (VANCOCIN) 1,750 mg in sodium chloride 0.9 % 500 mL IVPB     1,750 mg 250 mL/hr over 120 Minutes Intravenous  Once 05/18/17 2126 05/19/17 0400   05/17/17 0930  piperacillin-tazobactam (ZOSYN) IVPB 3.375 g  Status:  Discontinued     3.375 g 12.5 mL/hr over 240 Minutes Intravenous Every 8 hours 05/17/17 0836 05/19/17 0118   05/11/17 2200  cefoTEtan (CEFOTAN) 2 g in sodium chloride 0.9 % 100 mL IVPB     2 g 200 mL/hr over 30 Minutes Intravenous Every 12 hours 05/11/17 1839 05/11/17 2034   05/11/17 0600  cefoTEtan in Dextrose 5% (CEFOTAN) IVPB 2 g     2 g Intravenous On call to O.R. 05/10/17 1142 05/11/17 1416      Lab Results:  No results for input(s): WBC, HGB, HCT, PLT in the last 72 hours. BMET Recent Labs    06/14/17 0512  NA 149*  K 2.9*  CL 118*  CO2 24  GLUCOSE 206*  BUN 16  CREATININE 1.61*  CALCIUM 8.2*   PT/INR No results for input(s): LABPROT, INR in the last 72 hours. CMP     Component Value Date/Time   NA 149 (H) 06/14/2017 0512   K 2.9 (L) 06/14/2017 0512   CL 118 (H) 06/14/2017 0512   CO2 24 06/14/2017 0512   GLUCOSE 206 (H) 06/14/2017 0512   BUN 16  06/14/2017 0512   CREATININE 1.61 (H) 06/14/2017 0512   CALCIUM 8.2 (L) 06/14/2017 0512   PROT 6.3 (L) 06/10/2017 0500   ALBUMIN 2.0 (L) 06/10/2017 0500   AST 35 06/10/2017 0500   ALT 25 06/10/2017 0500   ALKPHOS 128 (H) 06/10/2017 0500   BILITOT 1.8 (H) 06/10/2017 0500   GFRNONAA 41 (L) 06/14/2017 0512   GFRAA 48 (L) 06/14/2017 0512   Lipase     Component Value Date/Time   LIPASE 22 05/06/2017 1036    Studies/Results: No results found.    Kalman Drape , [facility] Surgery 06/14/2017, 8:32 AM  Pager: [phone] Mon-Wed, Friday 7:00am-4:30pm Thurs 7am-11:30am  Consults: [phone]

## 2017-06-14 NOTE — Progress Notes (Signed)
MD notified patient not voided and Bladder scan showing 171. Orders received continue to monitor patient closely,

## 2017-06-14 NOTE — Clinical Social Work Note (Signed)
CSW continues to follow for discharge needs. Patient still has a cortrack and cannot be placed in SNF with this.  Dayton Scrape, [place]

## 2017-06-14 NOTE — Progress Notes (Signed)
Subjective:  Vomited earlier this morning tube Feeding was held which has been restarted again.  Abdomen remains distended and has large purulent drainage through JP bulb  Objective:  Vital Signs in the last 24 hours: Temp:  [97.2 F (36.2 C)-98.4 F (36.9 C)] 97.2 F (36.2 C) (06/04 0400) Pulse Rate:  [82-112] 112 (06/04 0824) Resp:  [16-27] 25 (06/04 0824) BP: (123-151)/(77-112) 129/108 (06/04 0400) SpO2:  [93 %-100 %] 93 % (06/04 0824) Weight:  [103.9 kg (229 lb)] 103.9 kg (229 lb) (06/04 0400)  Intake/Output from previous day: 06/03 0701 - 06/04 0700 In: 4155 [I.V.:130; XB/MW:4132; IV Piggyback:50] Out: 4401 [Urine:650; Drains:125; UUVOZ:3664] Intake/Output from this shift: No intake/output data recorded.  Physical Exam: Neck: no adenopathy, no carotid bruit, no JVD and supple, symmetrical, trachea midline Lungs: decreased breath sounds in bases with occasional rhonchi Heart: regular rate and rhythm, S1, S2 normal and 2/6 systolic murmur noted Abdomen: distended abdominal binder noted purulent material and drainage.JP bulb Extremities: no clubbing, cyanosis, 2+ edema noted  Lab Results: No results for input(s): WBC, HGB, PLT in the last 72 hours. Recent Labs    06/14/17 0512  NA 149*  K 2.9*  CL 118*  CO2 24  GLUCOSE 206*  BUN 16  CREATININE 1.61*   No results for input(s): TROPONINI in the last 72 hours.  Invalid input(s): CK, MB Hepatic Function Panel No results for input(s): PROT, ALBUMIN, AST, ALT, ALKPHOS, BILITOT, BILIDIR, IBILI in the last 72 hours. No results for input(s): CHOL in the last 72 hours. No results for input(s): PROTIME in the last 72 hours.  Imaging: Imaging results have been reviewed and No results found.  Cardiac Studies:  Assessment/Plan:  Rectosigmoid adenocarcinoma status post resection with primary colorectal anastomosis anddiverting loop ileostomy and umbilical hernia repair Resolving aspiration pneumonia Spontaneous  retroperitoneal hematoma Abdominalsurgical wound infectionwith bowel exposed. Metabolic encephalopathy Status postSeptichypotensive shock secondary Intra-abdominal sepsis/possible abscess/persistentanastomosis leak Acute on chronic renal injury.improved Status post paroxysmal A. 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 Plan Replace K Check chest x-ray. Check labs in a.m. Social services for discharge planning as per surgery   LOS: 39 days    Charolette Forward 06/14/2017, 10:30 AM

## 2017-06-14 NOTE — Progress Notes (Signed)
Physical Therapy Treatment Patient Details Name: Jesse Le MRN: [id number] DOB: August 02, 1945 Today's Date: 06/14/2017    History of Present Illness Pt is a 72 y.o. 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  He developed sepsis with septic shock from peritonitis. PMH includes HTN, DM, COPD, aortic aneurysm, CKD II, obesity.     PT Comments    Pt admitted with above diagnosis. Pt currently with functional limitations due to strength, balance and endurance deficits. Pt was able to perform exercises with max assist and cues.  Not following commands consistently and very confused.  Will continue to progress pt as able.   Pt will benefit from skilled PT to increase their independence and safety with mobility to allow discharge to the venue listed below.     Follow Up Recommendations  SNF;Supervision/Assistance - 24 hour     Equipment Recommendations  (TBD in next venue)    Recommendations for Other Services       Precautions / Restrictions Precautions Precautions: Fall Precaution Comments: ileostomy bag, open abdominal wound; JP drain.  Wound vac Required Braces or Orthoses: Other Brace/Splint Other Brace/Splint: abdominal binder  Restrictions Weight Bearing Restrictions: No    Mobility  Bed Mobility                  Transfers                    Ambulation/Gait                 Stairs             Wheelchair Mobility    Modified Rankin (Stroke Patients Only)       Balance                                            Cognition Arousal/Alertness: Awake/alert;Lethargic Behavior During Therapy: Flat affect Overall Cognitive Status: Impaired/Different from baseline Area of Impairment: Orientation;Attention;Following commands                 Orientation Level: Disoriented  to;Place;Time;Situation Current Attention Level: Focused   Following Commands: Follows one step commands consistently;Follows one step commands with increased time Safety/Judgement: Decreased awareness of safety;Decreased awareness of deficits Awareness: Emergent Problem Solving: Slow processing;Difficulty sequencing;Requires verbal cues;Requires tactile cues General Comments: Pt confused. Unaware of where he is.  Not follwoing commmands      Exercises General Exercises - Upper Extremity Shoulder Flexion: AROM;Both;10 reps;Supine Shoulder ABduction: AROM;Both;10 reps;Supine Elbow Flexion: AROM;Both;10 reps;Supine General Exercises - Lower Extremity Ankle Circles/Pumps: AROM;Both;10 reps;Supine Quad Sets: AROM;Both;10 reps;Supine Heel Slides: AROM;Both;10 reps;Supine Hip ABduction/ADduction: AROM;Both;10 reps;Supine Straight Leg Raises: AROM;Both;10 reps;Supine    General Comments        Pertinent Vitals/Pain Pain Assessment: No/denies pain    Home Living                      Prior Function            PT Goals (current goals can now be found in the care plan section) Acute Rehab PT Goals Patient Stated Goal: non stated Progress towards PT goals: Not progressing toward goals - comment(confused and not following commands)    Frequency    Min 2X/week      PT Plan Current plan  remains appropriate    Co-evaluation              AM-PAC PT "6 Clicks" Daily Activity  Outcome Measure  Difficulty turning over in bed (including adjusting bedclothes, sheets and blankets)?: Unable Difficulty moving from lying on back to sitting on the side of the bed? : Unable Difficulty sitting down on and standing up from a chair with arms (e.g., wheelchair, bedside commode, etc,.)?: Unable Help needed moving to and from a bed to chair (including a wheelchair)?: Total Help needed walking in hospital room?: Total Help needed climbing 3-5 steps with a railing? : Total 6  Click Score: 6    End of Session   Activity Tolerance: Patient limited by fatigue Patient left: in bed;with call bell/phone within reach Nurse Communication: Mobility status;Need for lift equipment PT Visit Diagnosis: Other abnormalities of gait and mobility (R26.89)     Time: 3491-7915 PT Time Calculation (min) (ACUTE ONLY): 10 min  Charges:  $Therapeutic Exercise: 8-22 mins                    G Codes:       [name],PT Acute Rehabilitation [phone] (pager)    Denice Paradise 06/14/2017, 11:32 AM

## 2017-06-15 ENCOUNTER — Inpatient Hospital Stay (HOSPITAL_COMMUNITY): Payer: Medicare Other

## 2017-06-15 LAB — GLUCOSE, CAPILLARY
GLUCOSE-CAPILLARY: 145 mg/dL — AB (ref 65–99)
GLUCOSE-CAPILLARY: 151 mg/dL — AB (ref 65–99)
GLUCOSE-CAPILLARY: 161 mg/dL — AB (ref 65–99)
GLUCOSE-CAPILLARY: 185 mg/dL — AB (ref 65–99)
Glucose-Capillary: 139 mg/dL — ABNORMAL HIGH (ref 65–99)
Glucose-Capillary: 102 mg/dL — ABNORMAL HIGH (ref 65–99)

## 2017-06-15 LAB — COMPREHENSIVE METABOLIC PANEL
ALBUMIN: 2 g/dL — AB (ref 3.5–5.0)
ALT: 19 U/L (ref 17–63)
ANION GAP: 3 — AB (ref 5–15)
AST: 30 U/L (ref 15–41)
Alkaline Phosphatase: 115 U/L (ref 38–126)
BUN: 16 mg/dL (ref 6–20)
CHLORIDE: 120 mmol/L — AB (ref 101–111)
CO2: 24 mmol/L (ref 22–32)
Calcium: 7.9 mg/dL — ABNORMAL LOW (ref 8.9–10.3)
Creatinine, Ser: 1.61 mg/dL — ABNORMAL HIGH (ref 0.61–1.24)
GFR calc non Af Amer: 41 mL/min — ABNORMAL LOW (ref >60)
GFR, EST AFRICAN AMERICAN: 48 mL/min — AB (ref >60)
GLUCOSE: 289 mg/dL — AB (ref 65–99)
POTASSIUM: 3.7 mmol/L (ref 3.5–5.1)
SODIUM: 147 mmol/L — AB (ref 135–145)
Total Bilirubin: 1.2 mg/dL (ref 0.3–1.2)
Total Protein: 6.4 g/dL — ABNORMAL LOW (ref 6.5–8.1)

## 2017-06-15 LAB — CBC
HCT: 26.8 % — ABNORMAL LOW (ref 39.0–52.0)
HEMOGLOBIN: 7.8 g/dL — AB (ref 13.0–17.0)
MCH: 26.2 pg (ref 26.0–34.0)
MCHC: 29.1 g/dL — AB (ref 30.0–36.0)
MCV: 89.9 fL (ref 78.0–100.0)
PLATELETS: 348 10*3/uL (ref 150–400)
RBC: 2.98 MIL/uL — ABNORMAL LOW (ref 4.22–5.81)
RDW: 27.7 % — ABNORMAL HIGH (ref 11.5–15.5)
WBC: 9.2 10*3/uL (ref 4.0–10.5)

## 2017-06-15 LAB — MAGNESIUM: Magnesium: 1.8 mg/dL (ref 1.7–2.4)

## 2017-06-15 MED ORDER — SODIUM CHLORIDE 0.9 % IV SOLN
INTRAVENOUS | Status: DC
Start: 2017-06-15 — End: 2017-06-22
  Administered 2017-06-15 – 2017-06-20 (×2): via INTRAVENOUS

## 2017-06-15 MED ORDER — METOPROLOL TARTRATE 25 MG PO TABS
25.0000 mg | ORAL_TABLET | Freq: Two times a day (BID) | ORAL | Status: DC
  Administered 2017-06-15 – 2017-06-21 (×12): 25 mg via ORAL
  Filled 2017-06-15 (×13): qty 1

## 2017-06-15 MED ORDER — INSULIN ASPART 100 UNIT/ML ~~LOC~~ SOLN
0.0000 [IU] | Freq: Three times a day (TID) | SUBCUTANEOUS | Status: DC
  Administered 2017-06-16: 3 [IU] via SUBCUTANEOUS
  Administered 2017-06-16 – 2017-06-17 (×3): 2 [IU] via SUBCUTANEOUS
  Administered 2017-06-17: 3 [IU] via SUBCUTANEOUS
  Administered 2017-06-18 – 2017-06-21 (×5): 2 [IU] via SUBCUTANEOUS

## 2017-06-15 NOTE — Progress Notes (Signed)
Daily Progress Note    Patient Name: Jesse Le       Date: 06/16/17 DOB: 06-May-1945  Age: 72 y.o. MRN#: [id number] Attending Physician: Charolette Forward, MD Primary Care Physician: Charolette Forward, MD Admit Date: 05/06/2017  Reason for Consultation/Follow-up: Establishing goals of care  Subjective: Patient wakes to voice. Oriented to name. Reoriented him to place and situation. Patient mumbles but appears comfortable. No longer with cortrak or restraints. No family at bedside.   GOC:  Spoke with stepdaughter/HCPOA Photographer). Mia works two jobs and is unfortunately unable to meet with me in person. She is appreciative of follow-up from palliative with updates. Discussed course of hospitalization. She is glad that he has been able to work with therapy and approved for diet. She is hopeful that he will be able to be placed in SNF for rehab. Her goal is that he becomes "healed as much as possible." She speaks of the challenges this has been for her father and the family, since prior to hospitalization he was fairly independent and living at home. She questions if he will need further oncology workup. PMT contact information given and recommended continued support from outpatient palliative.   Length of Stay: 40  Current Medications: Scheduled Meds:  . chlorhexidine  15 mL Mouth Rinse BID  . Chlorhexidine Gluconate Cloth  6 each Topical Daily  . collagenase   Topical Daily  . famotidine  40 mg Per Tube Daily  . feeding supplement (PRO-STAT SUGAR FREE 64)  30 mL Per Tube BID  . insulin aspart  0-15 Units Subcutaneous Q4H  . lip balm  1 application Topical BID  . mouth rinse  15 mL Mouth Rinse q12n4p  . metoprolol tartrate  25 mg Oral BID  . sodium chloride flush  10-40 mL Intracatheter Q12H  .  sodium chloride flush  5 mL Intracatheter Q8H  . vitamin C  500 mg Per Tube Daily    Continuous Infusions: . sodium chloride    . dextrose 5 % and 0.45 % NaCl with KCl 10 mEq/L 50 mL/hr at 06/14/17 1900  . feeding supplement (VITAL 1.5 CAL) 25 mL/hr at 06/15/17 0600  . piperacillin-tazobactam (ZOSYN)  IV Stopped (06/15/17 1030)    PRN Meds: acetaminophen (TYLENOL) oral liquid 160 mg/5 mL, Gerhardt's butt cream, hydrocortisone, hydrocortisone cream, [DISCONTINUED] ondansetron **  OR** ondansetron (ZOFRAN) IV, prochlorperazine, sodium chloride flush  Physical Exam  Constitutional: He is easily aroused.  HENT:  Head: Normocephalic and atraumatic.  Pulmonary/Chest: No accessory muscle usage. No tachypnea. No respiratory distress.  Abdominal: There is no tenderness.  Wound vac, Ileostomy  Neurological: He is alert and easily aroused.  Pleasantly confused  Skin: Skin is warm and dry.  Psychiatric: His speech is delayed. Cognition and memory are impaired.  Nursing note and vitals reviewed.          Vital Signs: BP (!) 158/85 (BP Location: Right Leg)   Pulse 88   Temp 98.7 F (37.1 C) (Axillary)   Resp 20   Ht 5\' 10"  (1.778 m)   Wt 102.1 kg (225 lb)   SpO2 100%   BMI 32.28 kg/m  SpO2: SpO2: 100 % O2 Device: O2 Device: Room Air O2 Flow Rate: O2 Flow Rate (L/min): 2 L/min  Intake/output summary:   Intake/Output Summary (Last 24 hours) at 06/15/2017 1617 Last data filed at 06/15/2017 1500 Gross per 24 hour  Intake 2897.59 ml  Output 1735 ml  Net 1162.59 ml   LBM: Last BM Date: 06/15/17 Baseline Weight: Weight: 94.8 kg (208 lb 15.9 oz) Most recent weight: Weight: 102.1 kg (225 lb)       Palliative Assessment/Data: PPS 30%   Flowsheet Rows     Most Recent Value  Intake Tab  Referral Department  Cardiology  Unit at Time of Referral  Intermediate Care Unit  Palliative Care Primary Diagnosis  Sepsis/Infectious Disease  Date Notified  06/09/17  Reason for referral  Clarify  Goals of Care  Date of Admission  05/06/17  Date first seen by Palliative Care  06/10/17  # of days Palliative referral response time  1 Day(s)  # of days IP prior to Palliative referral  34  Clinical Assessment  Palliative Performance Scale Score  30%  Pain Max last 24 hours  Not able to report  Pain Min Last 24 hours  Not able to report  Dyspnea Max Last 24 Hours  Not able to report  Dyspnea Min Last 24 hours  Not able to report  Nausea Max Last 24 Hours  Not able to report  Nausea Min Last 24 Hours  Not able to report  Anxiety Max Last 24 Hours  Not able to report  Anxiety Min Last 24 Hours  Not able to report  Other Max Last 24 Hours  Not able to report  Psychosocial & Spiritual Assessment  Palliative Care Outcomes  Patient/Family meeting held?  Yes  Who was at the meeting?  pt and SO  Palliative Care Outcomes  Provided psychosocial or spiritual support, ACP counseling assistance  Palliative Care follow-up planned  Yes, Facility      Patient Active Problem List   Diagnosis Date Noted  . Goals of care, counseling/discussion   . Palliative care by specialist   . Pressure injury of skin 06/09/2017  . Ascites   . Intra-abdominal abscess ([place])   . Pseudomonas infection   . Acute respiratory failure with hypoxemia ([place])   . Atelectasis   . Septic shock ([place])   . Hypoglycemia after GI (gastrointestinal) surgery   . Colostomy present ([place])   . Ventral hernia s/p primary repair 05/11/2017 05/14/2017  . Physical deconditioning 05/14/2017  . CAD (coronary atherosclerotic disease) 05/14/2017  . Diabetes mellitus without complication ([place])   . Acute blood loss anemia   . 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  Iron deficiency anemia due to chronic blood loss   . Abnormal CT scan, sigmoid colon   . Generalized abdominal pain 05/06/2017  . 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 Aftercare following surgery of the circulatory system, [place] 11/22/2011     Palliative Care Assessment & Plan   Patient Profile: 73 y.o. male  with past medical history of TAA, MI, hypertension, diabetes, coronary artery disease admitted on 05/06/2017 with abdominal abscess.  Patient was diagnosed in 2019 with rectosigmoid adenocarcinoma.  He underwent resection with anastomosis and diverting ileostomy.  He presented on 05/06/2017 with worsening abdominal pain and was found to have a large fluid collection in his abdomen.  A percutaneous drain has been placed.  Since that time he is required intubation, ICU and is now extubated on stepdown unit.  On 06/10/2017 CT of the abdomen shows a decrease in fluid collection in some places, but other areas unchanged.  He has had an episode of atrial fib with SVT and tube feedings are currently on hold.  Modified barium swallow performed on 5/31 with mild to moderate aspiration risk revealed.  Assessment: Rectosigmoid adenocarcinoma s/p resection with primary colorectal anastomosis and diverting loop ileostomy/hernia repair Resolving aspiration pneumonia S/p septic shock Intra-abdominal sepsis/possible abscess Paroxysmal afib  Recommendations/Plan:  Continue FULL code/FULL scope. Daughter/HCPOA hopeful for continued improvement and SNF placement.  Unsure if he will need further oncology workup?  SW following for disposition. Clinical improvement. Cortrak and restraints have been discontinued.   May benefit from continued support from outpatient palliative services.   Goals of Care and Additional Recommendations:  Limitations on Scope of Treatment: Full Scope Treatment  Code Status: FULL   Code Status Orders  (From admission, onward)        Start     Ordered   05/06/17 2149  Full code  Continuous     05/06/17 2148    Code Status History    Date Active Date Inactive Code Status Order ID Comments User Context   10/09/2012 2126 10/13/2012 1455 Full Code [id number]  Charolette Forward, MD Inpatient   10/11/2011 1238  10/14/2011 1753 DNR [id number]  Clent Demark, MD Inpatient   10/09/2011 1826 10/11/2011 1238 Full Code [id number]  Etta Quill, RN Inpatient       Prognosis:   Unable to determine  Discharge Planning:  To Be Determined  Care plan was discussed with patient, step-daughter Maree Erie), RN CM  Thank you for allowing the Palliative Medicine Team to assist in the care of this patient.   Time In: 1050- 1140- Time Out: 1100 1155 Total Time 38min Prolonged Time Billed no      Greater than 50%  of this time was spent counseling and coordinating care related to the above assessment and plan.  Ihor Dow, FNP-C Palliative Medicine Team  Phone: [phone] Fax: [phone]  Please contact Palliative Medicine Team phone at [phone] for questions and concerns.

## 2017-06-15 NOTE — Progress Notes (Signed)
Pt has been out of soft wrist restraints since 10am. Dr. Terrence Dupont aware. Mittens DC'd at 1730. Cortrak removed at 1800. Pt far less agitated today. Pt far less agitated today, following commands appropriately. Restraint order will not be renewed.   Fritz Pickerel, RN

## 2017-06-15 NOTE — Progress Notes (Signed)
Visited with the patient briefly-he expressed no need of prayer.   06/15/17 1400  Clinical Encounter Type  Visited With Patient  Visit Type Initial;Spiritual support  Referral From Nurse  Consult/Referral To Chaplain  Spiritual Encounters  Spiritual Needs Other (Comment) (offered prayer for patient. )

## 2017-06-15 NOTE — Consult Note (Signed)
[place] Nurse wound follow up Wound type: surgical wound midline abdominal, full thickness surgical wound. Full thickness wound, left side of the abdomen related to medical adhesive skin damage. Partial thickness skin damage right side of abdomen related to medical adhesive skin damage from ostomy tape barrier.  Measurement: Midline: 8.5cm x 13cm x 1.0cm with undermining from 6-8 o'clock and tunnel at 4 o'clock  Right abdominal wound: 1cm x 0.5cm x 0.1cm  Left abdominal wound: 1cm x 2cm x 0.4cm  Wound bed: Midline unchanged, see note from Monday Right abdominal wound: clean, pink, moist Left abdominal wound: clean, pink, moist Drainage (amount, consistency, odor) serous in canister in VAC, serosanguinous from both of the wounds right and left   Periwound: intact  Dressing procedure/placement/frequency: 3 pc of white foam used (1 in undermined area, 1 in tunneled area, and 1 to fill remainder of the wound bed) 1 pc of mepitel in wound base 1 pc of black foam to fill remainder of wound bed  Foam to the superficial wounds from MARSI, left side of the abdomen is a bit deeper will add calcium alginate to fill wound bed.   Ostomy pouch intact from Monday change. Will change pouch again with Friday VAC change.   [place], [place], [place]

## 2017-06-15 NOTE — Evaluation (Signed)
Clinical/Bedside Swallow Evaluation Patient Details  Name: Jesse Le MRN: [id number] Date of Birth: 10-22-1945  Today's Date: 06/15/2017 Time: SLP Start Time (ACUTE ONLY): 1696 SLP Stop Time (ACUTE ONLY): 1635 SLP Time Calculation (min) (ACUTE ONLY): 15 min  Past Medical History:  Past Medical History:  Diagnosis Date  . Aneurysm of aorta ([facility])   . Coronary artery disease   . Diabetes mellitus without complication ([place])    NEW ONSET 09/2012  . Hypertension   . Myocardial infarct [facility])    Past Surgical History:  Past Surgical History:  Procedure Laterality Date  . ABDOMINAL AORTIC ANEURYSM REPAIR  10-14-11   TEVAR  . COLON RESECTION SIGMOID N/A 05/11/2017   Procedure: Colon Resection Sigmoid Low Anterior Resection with Staple Colon Relast;  Surgeon: Donnie Mesa, MD;  Location: [place];  Service: General;  Laterality: N/A;  . COLONOSCOPY WITH PROPOFOL N/A 05/08/2017   Procedure: COLONOSCOPY WITH PROPOFOL;  Surgeon: Jerene Bears, MD;  Location: [place];  Service: Gastroenterology;  Laterality: N/A;  . ILEOSTOMY N/A 05/11/2017   Procedure: Creation Vertical Loop ILEOSTOMY;  Surgeon: Donnie Mesa, MD;  Location: [place];  Service: General;  Laterality: N/A;  . INTRAVASCULAR ULTRASOUND  10/14/2011   Procedure: INTRAVASCULAR ULTRASOUND;  Surgeon: Serafina Mitchell, MD;  Location: [place];  Service: Vascular;  Laterality: N/A;  . UMBILICAL HERNIA REPAIR N/A 05/11/2017   Procedure: HERNIA REPAIR UMBILICAL ADULT;  Surgeon: Donnie Mesa, MD;  Location: [place];  Service: General;  Laterality: N/A;  . VIDEO BRONCHOSCOPY  10/14/2011   Procedure: VIDEO BRONCHOSCOPY;  Surgeon: Serafina Mitchell, MD;  Location: [facility] OR;  Service: Vascular;  Laterality: N/A;   HPI:  72 yo male presented with abdominal pain, weight loss, intermittent hematochezia and found to have 9 cm colon mass from adenocarcinoma of recto-sigmoid colon.  Underwent low anterior resection with primary anastomosis with diverting loop  ileostomy.  Developed hypotension, hypoglycemia 5/07 and altered mental status 5/08 and transferred to ICU with septic shock secondary to intraabdominal source (peritonitis vs wound infection). Required intubation 5/22 due to tachycardua and hypoxia. Self extubated 5/28. PMHx of CAD, DM, TAA, MI, HTN. BSE 05/27/17 rec'd D1, thin. MBS 5/31 with findings of primary oral dysphagia; normal pharyngeal phase.  TF were on hold at that time due to abdominal abcess.  Pt now ready to resume PO diet; new orders received.    Assessment / Plan / Recommendation Clinical Impression  Pt mildly confused, irritable, requiring encouragement to participate in reassessment of swallow.  Allowed HOB to be elevated slightly.  Refused oral care.  Willing to accept limited amounts of thin liquid and pudding - consumed trials with adequate oral attention; minimal cough initially.  No further coughing noted with water, Coke.  MBS 5/31 revealed adequate airway protection, occasional coughing that was NOT associated with penetration/aspiration.  Recommend beginning a full liquid diet.  Position the [facility] as high as pt will allow; provide assistance with self-feeding.  [employer]; cortrak may be discontinued.  SLP will follow for education/diet progression.  SLP Visit Diagnosis: Dysphagia, oral phase (R13.11)    Aspiration Risk  Mild aspiration risk    Diet Recommendation   Full liquid diet  Medication Administration: Crushed with puree    Other  Recommendations Oral Care Recommendations: Oral care BID Other Recommendations: Have oral suction available   Follow up Recommendations Skilled Nursing facility      Frequency and Duration  Prognosis Prognosis for Safe Diet Advancement: Good      Swallow Study   General Date of Onset: 05/06/17 HPI: 72 yo male presented with abdominal pain, weight loss, intermittent hematochezia and found to have 9 cm colon mass from adenocarcinoma of recto-sigmoid colon.  Underwent  low anterior resection with primary anastomosis with diverting loop ileostomy.  Developed hypotension, hypoglycemia 5/07 and altered mental status 5/08 and transferred to ICU with septic shock secondary to intraabdominal source (peritonitis vs wound infection). Required intubation 5/22 due to tachycardua and hypoxia. Self extubated 5/28. PMHx of CAD, DM, TAA, MI, HTN. BSE 05/27/17 rec'd D1, thin. MBS 5/31 with findings of primary oral dysphagia; normal pharyngeal phase.  TF were on hold at that time due to abdominal abcess.  Pt now ready to resume PO diet; new orders received.  Type of Study: Bedside Swallow Evaluation Previous Swallow Assessment: see HPI Diet Prior to this Study: NPO;NG Tube Temperature Spikes Noted: No Respiratory Status: Room air History of Recent Intubation: Yes Length of Intubations (days): 7 days Date extubated: 06/07/17 Behavior/Cognition: Alert;Confused;Agitated Oral Cavity Assessment: Dried secretions Oral Care Completed by SLP: Other (Comment)(pt refused oral hygiene) Oral Cavity - Dentition: Missing dentition;Poor condition Vision: Functional for self-feeding Self-Feeding Abilities: Needs assist Patient Positioning: Partially reclined Baseline Vocal Quality: Normal Volitional Cough: Strong Volitional Swallow: Able to elicit    Oral/Motor/Sensory Function     Ice Chips Ice chips: Within functional limits   Thin Liquid Thin Liquid: Impaired Presentation: Cup;Straw Pharyngeal  Phase Impairments: Throat Clearing - Delayed;Cough - Delayed    Nectar Thick Nectar Thick Liquid: Not tested   Honey Thick Honey Thick Liquid: Not tested   Puree Puree: Within functional limits   Solid   GO   Solid: Not tested        Juan Quam Laurice 06/15/2017,4:40 PM   Estill Bamberg L. Tivis Ringer, [place] CCC/SLP Pager [phone]

## 2017-06-15 NOTE — Progress Notes (Signed)
SLP Cancellation?discharge Note  Patient Details Name: Jesse Le MRN: [id number] DOB: May 23, 1945   Cancelled treatment:       Reason Eval/Treat Not Completed: Medical issues which prohibited therapy.  Pt remains NPO.  Our services will sign off.  Per 5/31 MBS, pharyngeal phase of swallow was functional with no aspiration of liquids.  When/if pt becomes appropriate for any PO intake, clear liquids should be safely tolerated unless pt has declined medically since time of swallow study.  Please reorder SLP should our services be helpful.  Thank you,  Jesse Le 06/15/2017, 11:14 AM

## 2017-06-15 NOTE — Plan of Care (Signed)
Care plans reviewed and patient is slowly progressing.

## 2017-06-15 NOTE — Plan of Care (Signed)
Care plans reviewed and patient is progressing.  

## 2017-06-15 NOTE — Progress Notes (Signed)
Subjective:  Patient denies any chest pain or shortness of breath.  Denies abdominal pain.  Continues to have significant drainage through the JP bulb  Objective:  Vital Signs in the last 24 hours: Temp:  [98.1 F (36.7 C)-99 F (37.2 C)] 98.7 F (37.1 C) (06/05 1209) Pulse Rate:  [80-103] 88 (06/05 1000) Resp:  [20-30] 20 (06/05 1209) BP: (117-158)/(65-85) 158/85 (06/05 1209) SpO2:  [93 %-100 %] 100 % (06/05 1209) Weight:  [102.1 kg (225 lb)] 102.1 kg (225 lb) (06/05 0323)  Intake/Output from previous day: 06/04 0701 - 06/05 0700 In: 1926.7 [I.V.:944.2; NG/GT:822.5; IV Piggyback:150] Out: 1310 [Urine:575; Drains:110; Stool:625] Intake/Output from this shift: Total I/O In: 410.9 [I.V.:330.9; Other:50; NG/GT:30] Out: 425 [Drains:125; Stool:300]  Physical Exam: Neck: no adenopathy, no carotid bruit, no JVD and supple, symmetrical, trachea midline Lungs: decreased breath sounds at bases with occasional rhonchi Heart: regular rate and rhythm, S1, S2 normal and systolic murmur noted Abdomen: distended.  Bowel sounds faint.  Wound VAC and ileostomy noted.  JP bulb also noted Extremities: no clubbing, cyanosis, 1+ edema noted  Lab Results: Recent Labs    06/15/17 0333  WBC 9.2  HGB 7.8*  PLT 348   Recent Labs    06/14/17 0512 06/15/17 0333  NA 149* 147*  K 2.9* 3.7  CL 118* 120*  CO2 24 24  GLUCOSE 206* 289*  BUN 16 16  CREATININE 1.61* 1.61*   No results for input(s): TROPONINI in the last 72 hours.  Invalid input(s): CK, MB Hepatic Function Panel Recent Labs    06/15/17 0333  PROT 6.4*  ALBUMIN 2.0*  AST 30  ALT 19  ALKPHOS 115  BILITOT 1.2   No results for input(s): CHOL in the last 72 hours. No results for input(s): PROTIME in the last 72 hours.  Imaging: Imaging results have been reviewed and Dg Chest Port 1 View  Result Date: 06/15/2017 CLINICAL DATA:  Cough. EXAM: PORTABLE CHEST 1 VIEW COMPARISON:  Radiograph of Jun 08, 2017. FINDINGS: Stable  cardiomediastinal silhouette. Stent graft of descending thoracic aorta is again noted. Right-sided PICC line is unchanged in position. No pneumothorax is noted. Stable right upper lobe opacity is noted concerning for possible pneumonia. Minimal bibasilar subsegmental atelectasis may be present. Bony thorax is unremarkable. IMPRESSION: Stable right upper lobe opacity is noted concerning for possible pneumonia. Minimal bibasilar subsegmental atelectasis. Electronically Signed   By: Marijo Conception, M.D.   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 Metabolic encephalopathy Status postSeptichypotensive shock secondary Intra-abdominal sepsis/possible abscess/persistentanastomosis leak Acute on chronic renal injury.improved Status post paroxysmal A. 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 Plan As per orders. Increase tube feeding as per surgery   LOS: 40 days    Jesse Le, Jesse Le 06/15/2017, 1:16 PM

## 2017-06-16 DIAGNOSIS — Z5181 Encounter for therapeutic drug level monitoring: Secondary | ICD-10-CM

## 2017-06-16 DIAGNOSIS — Z515 Encounter for palliative care: Secondary | ICD-10-CM

## 2017-06-16 DIAGNOSIS — Z7189 Other specified counseling: Secondary | ICD-10-CM

## 2017-06-16 LAB — GLUCOSE, CAPILLARY
GLUCOSE-CAPILLARY: 122 mg/dL — AB (ref 65–99)
GLUCOSE-CAPILLARY: 137 mg/dL — AB (ref 65–99)
Glucose-Capillary: 127 mg/dL — ABNORMAL HIGH (ref 65–99)
Glucose-Capillary: 153 mg/dL — ABNORMAL HIGH (ref 65–99)

## 2017-06-16 MED ORDER — FAMOTIDINE 20 MG PO TABS
40.0000 mg | ORAL_TABLET | Freq: Every day | ORAL | Status: DC
  Administered 2017-06-17 – 2017-06-21 (×5): 40 mg via ORAL
  Filled 2017-06-16 (×5): qty 2

## 2017-06-16 MED ORDER — ENSURE ENLIVE PO LIQD
237.0000 mL | Freq: Two times a day (BID) | ORAL | Status: DC
  Administered 2017-06-16 – 2017-06-21 (×11): 237 mL via ORAL

## 2017-06-16 MED ORDER — VITAMIN C 500 MG PO TABS
500.0000 mg | ORAL_TABLET | Freq: Every day | ORAL | Status: DC
  Administered 2017-06-17 – 2017-06-21 (×5): 500 mg via ORAL
  Filled 2017-06-16 (×5): qty 1

## 2017-06-16 MED ORDER — PRO-STAT SUGAR FREE PO LIQD
30.0000 mL | Freq: Two times a day (BID) | ORAL | Status: DC
  Administered 2017-06-17 – 2017-06-20 (×6): 30 mL via ORAL
  Filled 2017-06-16 (×7): qty 30

## 2017-06-16 NOTE — Progress Notes (Signed)
    [facility] for Infectious Disease   Reason for visit: Follow up on intraabdominal abscess  Interval History: no fever, no chills; no abdominal pain; no associated rash, diarrhea.  Eating.  Drain remains in place and draining; recent CT with large fluid collection and followed by IR.    Physical Exam: Constitutional:  Vitals:   06/16/17 0801 06/16/17 1114  BP: (!) 169/70 (!) 165/79  Pulse: 81 85  Resp: (!) 30 20  Temp: 98.4 F (36.9 C) 98.8 F (37.1 C)  SpO2: 100% 100%   patient appears in NAD Eyes: anicteric Respiratory: Normal respiratory effort; CTA B Cardiovascular: RRR GI: soft, nt, nd  Review of Systems: Constitutional: negative for fevers, chills and malaise Gastrointestinal: negative for nausea and diarrhea Integument/breast: negative for rash  Lab Results  Component Value Date   WBC 9.2 06/15/2017   HGB 7.8 (L) 06/15/2017   HCT 26.8 (L) 06/15/2017   MCV 89.9 06/15/2017   PLT 348 06/15/2017    Lab Results  Component Value Date   CREATININE 1.61 (H) 06/15/2017   BUN 16 06/15/2017   NA 147 (H) 06/15/2017   K 3.7 06/15/2017   CL 120 (H) 06/15/2017   CO2 24 06/15/2017    Lab Results  Component Value Date   ALT 19 06/15/2017   AST 30 06/15/2017   ALKPHOS 115 06/15/2017     Microbiology: Recent Results (from the past 240 hour(s))  MRSA PCR Screening     Status: None   Collection Time: 06/10/17  4:18 AM  Result Value Ref Range Status   MRSA by PCR NEGATIVE NEGATIVE Final    Comment:        The GeneXpert MRSA Assay (FDA approved for NASAL specimens only), is one component of a comprehensive MRSA colonization surveillance program. It is not intended to diagnose MRSA infection nor to guide or monitor treatment for MRSA infections. Performed at [facility], [place] [address], [place], [place] [address]     Impression/Plan:  1. Intra abdominal abscess - still draining.  On pip tazo.  I recommend continuing piptazo for 10 more days and  if abscess resolved, drain comes out, can stop.  If a signficant amount of fluid remains, will need to continue.  We will arrange follow up in about 10 days.   Will need follow up in the drain clinic  2.  Medication monitoring - should recheck cbc, cmp in 1 week

## 2017-06-16 NOTE — Care Management Important Message (Signed)
Important Message  Patient Details  Name: Jesse Le MRN: [id number] Date of Birth: 1946-01-05   Medicare Important Message Given:  Yes    Sheresa Cullop P Aloura Matsuoka 06/16/2017, 3:14 PM

## 2017-06-16 NOTE — Progress Notes (Signed)
  Speech Language Pathology Treatment: Dysphagia  Patient Details Name: Jesse Le MRN: [id number] DOB: March 12, 1945 Today's Date: 06/16/2017 Time: 1194-1740 SLP Time Calculation (min) (ACUTE ONLY): 14 min  Assessment / Plan / Recommendation Clinical Impression  Pt's demeanor similar to yesterday's session. He was irritable, mostly resistant to SLP's assist, saying I could "go on and leave him alone". Consumed one sip coffee with spillage and one trial juice via straw with delayed cough; refused any solids (ice cream). Nursing documenting rhonchi for lung sounds; afebrile. Pt needs continued support for encouragement to eat and utilize swallow precautions. Will continue to follow.    HPI HPI: 72 yo male presented with abdominal pain, weight loss, intermittent hematochezia and found to have 9 cm colon mass from adenocarcinoma of recto-sigmoid colon.  Underwent low anterior resection with primary anastomosis with diverting loop ileostomy.  Developed hypotension, hypoglycemia 5/07 and altered mental status 5/08 and transferred to ICU with septic shock secondary to intraabdominal source (peritonitis vs wound infection). Required intubation 5/22 due to tachycardua and hypoxia. Self extubated 5/28. PMHx of CAD, DM, TAA, MI, HTN. BSE 05/27/17 rec'd D1, thin. MBS 5/31 with findings of primary oral dysphagia; normal pharyngeal phase.  TF were on hold at that time due to abdominal abcess.  Pt now ready to resume PO diet; new orders received.       SLP Plan  Continue with current plan of care       Recommendations  Diet recommendations: Thin liquid(full liquids) Liquids provided via: Cup;Straw Medication Administration: Crushed with puree Supervision: Patient able to self feed;Full supervision/cueing for compensatory strategies;Staff to assist with self feeding Compensations: Minimize environmental distractions;Slow rate;Small sips/bites Postural Changes and/or Swallow Maneuvers: Seated upright 90 degrees                 Oral Care Recommendations: Oral care BID Follow up Recommendations: Skilled Nursing facility;24 hour supervision/assistance SLP Visit Diagnosis: Dysphagia, oral phase (R13.11) Plan: Continue with current plan of care       GO                Jesse Le 06/16/2017, 9:29 AM  Jesse Le.[employer] [phone]

## 2017-06-16 NOTE — Progress Notes (Signed)
Subjective:  More awake today.  Off NG tube feeding.  Denies any chest pain or shortness of breath.  Denies abdominal pain.  Appetite very poor  Objective:  Vital Signs in the last 24 hours: Temp:  [98.4 F (36.9 C)-98.8 F (37.1 C)] 98.8 F (37.1 C) (06/06 1114) Pulse Rate:  [73-94] 85 (06/06 1114) Resp:  [20-30] 20 (06/06 1114) BP: (142-169)/(70-85) 165/79 (06/06 1114) SpO2:  [95 %-100 %] 100 % (06/06 1114) Weight:  [101.6 kg (224 lb)] 101.6 kg (224 lb) (06/06 0457)  Intake/Output from previous day: 06/05 0701 - 06/06 0700 In: 960.9 [I.V.:635.9; NG/GT:30; IV Piggyback:230] Out: 1910 [Urine:650; Drains:260; Stool:1000] Intake/Output from this shift: Total I/O In: 5 [Other:5] Out: 205 [Urine:125; Drains:30; Stool:50]  Physical Exam: Neck: no adenopathy, no carotid bruit, no JVD and supple, symmetrical, trachea midline Lungs: decreased breath sounds at bases with occasional rhonchi Heart: regular rate and rhythm, S1, S2 normal and soft systolic murmur noted Abdomen: distended.  Bowel sounds faint.  Ileostomy and wound VAC noted.  JP Draining purulent material Extremities: no clubbing, cyanosis, 1+ edema noted  Lab Results: Recent Labs    06/15/17 0333  WBC 9.2  HGB 7.8*  PLT 348   Recent Labs    06/14/17 0512 06/15/17 0333  NA 149* 147*  K 2.9* 3.7  CL 118* 120*  CO2 24 24  GLUCOSE 206* 289*  BUN 16 16  CREATININE 1.61* 1.61*   No results for input(s): TROPONINI in the last 72 hours.  Invalid input(s): CK, MB Hepatic Function Panel Recent Labs    06/15/17 0333  PROT 6.4*  ALBUMIN 2.0*  AST 30  ALT 19  ALKPHOS 115  BILITOT 1.2   No results for input(s): CHOL in the last 72 hours. No results for input(s): PROTIME in the last 72 hours.  Imaging: Imaging results have been reviewed and Dg Chest Port 1 View  Result Date: 06/15/2017 CLINICAL DATA:  Cough. EXAM: PORTABLE CHEST 1 VIEW COMPARISON:  Radiograph of Jun 08, 2017. FINDINGS: Stable  cardiomediastinal silhouette. Stent graft of descending thoracic aorta is again noted. Right-sided PICC line is unchanged in position. No pneumothorax is noted. Stable right upper lobe opacity is noted concerning for possible pneumonia. Minimal bibasilar subsegmental atelectasis may be present. Bony thorax is unremarkable. IMPRESSION: Stable right upper lobe opacity is noted concerning for possible pneumonia. Minimal bibasilar subsegmental atelectasis. Electronically Signed   By: Marijo Conception, M.D.   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 Metabolic encephalopathy Status postSeptichypotensive shock secondary Intra-abdominal sepsis/possible abscess/persistentanastomosis leak Acute on chronic renal injury.improved Status post paroxysmal A. 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 Out of bed to chair. Ambulate as tolerated. Awaiting skilled nursing facility. Stable from cardiac point of view to be discharged  Will discuss with infectious disease regarding antibiotic recommendations  LOS: 41 days    Charolette Forward 06/16/2017, 12:07 PM

## 2017-06-16 NOTE — Progress Notes (Signed)
[facility]/Trauma Progress Note  36 Days Post-Op   Assessment/Plan Principal Problem: Cancer of sigmoid s/p LAR rectosigmoid resection 05/11/2017 Active Problems: Generalized abdominal pain Iron deficiency anemia due to chronic blood loss Abnormal CT scan, sigmoid colon Acute blood loss anemia Ventral hernia s/p primary repair 05/11/2017 Physical deconditioning Diabetes mellitus without complication (HCC) CAD (coronary atherosclerotic disease) Septic shock (HCC) Hypoglycemia after GI (gastrointestinal) surgery Colostomy present (HCC) Acute respiratory failure with hypoxemia (HCC) Atelectasis Ascites Intra-abdominal abscess (HCC) Pseudomonas infection Pressure injury of skin Goals of care, counseling/discussion Palliative care by specialist  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 - S/PLow anterior resection with primary colorectal anastomosis, diverting loop ileostomy, rigid proctoscopy, umbilical hernia repair 6/0/1093 Dr. Donnie Mesa, -Postop anastomotic leak - IR drain 05/26/17 -Wound dehiscence/possible exposed bowel at the base of the wound. - continue Wound VAC M/W/F - CT 05/31 showed decrease in size of superior and perihepatic fluid collections, inferior aspect unchanged, stable fluid collections along anterior abdominal wall and LLQ. IR following for RUQ drain.   FEN:FLD, okay to advance to soft pending speech eval today VTE:per medicine AT:[facility] 05/10>> Foley:yes Follow up:Dr. Georgette Dover  DISPO:continue wound vac, FLD and advance to soft if speech clears PT recommending SNF Pt is okay from a surgical standpoint to be discharge to SNF when medically ready per cardiology. Pt will need to be discharge with drain, and with wound vac.   LOS: 41 days    Subjective: CC: none today  No abdominal pain. Pt is working with speech therapy now. No issues overnight. More alert  and awake. Pt keeps talking about not "playing games". Wants more sugar in his coffee and wants a pepsi.   Objective: Vital signs in last 24 hours: Temp:  [98.4 F (36.9 C)-98.8 F (37.1 C)] 98.4 F (36.9 C) (06/06 0801) Pulse Rate:  [73-94] 81 (06/06 0801) Resp:  [20-30] 30 (06/06 0801) BP: (142-169)/(70-85) 169/70 (06/06 0801) SpO2:  [94 %-100 %] 100 % (06/06 0801) Weight:  [101.6 kg (224 lb)] 101.6 kg (224 lb) (06/06 0457) Last BM Date: 06/16/17  Intake/Output from previous day: 06/05 0701 - 06/06 0700 In: 960.9 [I.V.:635.9; NG/GT:30; IV Piggyback:230] Out: 1910 [Urine:650; Drains:260; Stool:1000] Intake/Output this shift: No intake/output data recorded.  PE: Gen: Alert, NAD Pulm:Rate andeffort normal Abd: Soft, NT/ND, +BS, ileostomy with brown liquid stool and gas in bag, midline wound with vac in place, RUQ drain with yellow purulent drainge Neuro: follows commands, alert and oriented Skin: warm and dry   Anti-infectives: Anti-infectives (From admission, onward)   Start     Dose/Rate Route Frequency Ordered Stop   05/28/17 1200  vancomycin (VANCOCIN) 1,250 mg in sodium chloride 0.9 % 250 mL IVPB  Status:  Discontinued     1,250 mg 166.7 mL/hr over 90 Minutes Intravenous Every 24 hours 05/27/17 1324 05/30/17 1615   05/27/17 1100  vancomycin (VANCOCIN) 1,250 mg in sodium chloride 0.9 % 250 mL IVPB  Status:  Discontinued     1,250 mg 166.7 mL/hr over 90 Minutes Intravenous Every 12 hours 05/27/17 1011 05/27/17 1324   05/20/17 2230  vancomycin (VANCOCIN) 1,250 mg in sodium chloride 0.9 % 250 mL IVPB  Status:  Discontinued     1,250 mg 166.7 mL/hr over 90 Minutes Intravenous Every 24 hours 05/20/17 2151 05/27/17 1011   05/20/17 1400  piperacillin-tazobactam (ZOSYN) IVPB 3.375 g     3.375 g 12.5 mL/hr over 240 Minutes Intravenous Every 8 hours 05/20/17 1044     05/20/17 0500  anidulafungin (ERAXIS) 100 mg in sodium chloride 0.9 % 100 mL IVPB  Status:  Discontinued      100 mg 78 mL/hr over 100 Minutes Intravenous Every 24 hours 05/19/17 0116 05/29/17 1214   05/19/17 2200  vancomycin (VANCOCIN) 1,500 mg in sodium chloride 0.9 % 500 mL IVPB  Status:  Discontinued     1,500 mg 250 mL/hr over 120 Minutes Intravenous Every 24 hours 05/18/17 2126 05/20/17 2150   05/19/17 0130  anidulafungin (ERAXIS) 200 mg in sodium chloride 0.9 % 200 mL IVPB     200 mg 78 mL/hr over 200 Minutes Intravenous  Once 05/19/17 0116 05/19/17 0520   05/19/17 0130  Ampicillin-Sulbactam (UNASYN) 3 g in sodium chloride 0.9 % 100 mL IVPB  Status:  Discontinued     3 g 200 mL/hr over 30 Minutes Intravenous Every 8 hours 05/19/17 0118 05/20/17 1044   05/18/17 2200  vancomycin (VANCOCIN) 1,750 mg in sodium chloride 0.9 % 500 mL IVPB     1,750 mg 250 mL/hr over 120 Minutes Intravenous  Once 05/18/17 2126 05/19/17 0400   05/17/17 0930  piperacillin-tazobactam (ZOSYN) IVPB 3.375 g  Status:  Discontinued     3.375 g 12.5 mL/hr over 240 Minutes Intravenous Every 8 hours 05/17/17 0836 05/19/17 0118   05/11/17 2200  cefoTEtan (CEFOTAN) 2 g in sodium chloride 0.9 % 100 mL IVPB     2 g 200 mL/hr over 30 Minutes Intravenous Every 12 hours 05/11/17 1839 05/11/17 2034   05/11/17 0600  cefoTEtan in Dextrose 5% (CEFOTAN) IVPB 2 g     2 g Intravenous On call to O.R. 05/10/17 1142 05/11/17 1416      Lab Results:  Recent Labs    06/15/17 0333  WBC 9.2  HGB 7.8*  HCT 26.8*  PLT 348   BMET Recent Labs    06/14/17 0512 06/15/17 0333  NA 149* 147*  K 2.9* 3.7  CL 118* 120*  CO2 24 24  GLUCOSE 206* 289*  BUN 16 16  CREATININE 1.61* 1.61*  CALCIUM 8.2* 7.9*   PT/INR No results for input(s): LABPROT, INR in the last 72 hours. CMP     Component Value Date/Time   NA 147 (H) 06/15/2017 0333   K 3.7 06/15/2017 0333   CL 120 (H) 06/15/2017 0333   CO2 24 06/15/2017 0333   GLUCOSE 289 (H) 06/15/2017 0333   BUN 16 06/15/2017 0333   CREATININE 1.61 (H) 06/15/2017 0333   CALCIUM 7.9 (L)  06/15/2017 0333   PROT 6.4 (L) 06/15/2017 0333   ALBUMIN 2.0 (L) 06/15/2017 0333   AST 30 06/15/2017 0333   ALT 19 06/15/2017 0333   ALKPHOS 115 06/15/2017 0333   BILITOT 1.2 06/15/2017 0333   GFRNONAA 41 (L) 06/15/2017 0333   GFRAA 48 (L) 06/15/2017 0333   Lipase     Component Value Date/Time   LIPASE 22 05/06/2017 1036    Studies/Results: Dg Chest Port 1 View  Result Date: 06/15/2017 CLINICAL DATA:  Cough. EXAM: PORTABLE CHEST 1 VIEW COMPARISON:  Radiograph of Jun 08, 2017. FINDINGS: Stable cardiomediastinal silhouette. Stent graft of descending thoracic aorta is again noted. Right-sided PICC line is unchanged in position. No pneumothorax is noted. Stable right upper lobe opacity is noted concerning for possible pneumonia. Minimal bibasilar subsegmental atelectasis may be present. Bony thorax is unremarkable. IMPRESSION: Stable right upper lobe opacity is noted concerning for possible pneumonia. Minimal bibasilar subsegmental atelectasis. Electronically Signed   By: Marijo Conception, M.D.  On: 06/15/2017 09:25      Kalman Drape , [facility] 06/16/2017, 9:05 AM  Pager: [phone] Mon-Wed, Friday 7:00am-4:30pm Thurs 7am-11:30am  Consults: [phone]

## 2017-06-17 LAB — BASIC METABOLIC PANEL
Anion gap: 8 (ref 5–15)
BUN: 14 mg/dL (ref 6–20)
CALCIUM: 8.1 mg/dL — AB (ref 8.9–10.3)
CO2: 22 mmol/L (ref 22–32)
CREATININE: 1.53 mg/dL — AB (ref 0.61–1.24)
Chloride: 117 mmol/L — ABNORMAL HIGH (ref 101–111)
GFR calc Af Amer: 51 mL/min — ABNORMAL LOW (ref >60)
GFR, EST NON AFRICAN AMERICAN: 44 mL/min — AB (ref >60)
Glucose, Bld: 158 mg/dL — ABNORMAL HIGH (ref 65–99)
Potassium: 3.6 mmol/L (ref 3.5–5.1)
Sodium: 147 mmol/L — ABNORMAL HIGH (ref 135–145)

## 2017-06-17 LAB — GLUCOSE, CAPILLARY
GLUCOSE-CAPILLARY: 118 mg/dL — AB (ref 65–99)
Glucose-Capillary: 153 mg/dL — ABNORMAL HIGH (ref 65–99)
Glucose-Capillary: 176 mg/dL — ABNORMAL HIGH (ref 65–99)
Glucose-Capillary: 135 mg/dL — ABNORMAL HIGH (ref 65–99)

## 2017-06-17 LAB — CBC
HCT: 31.4 % — ABNORMAL LOW (ref 39.0–52.0)
Hemoglobin: 9.1 g/dL — ABNORMAL LOW (ref 13.0–17.0)
MCH: 27 pg (ref 26.0–34.0)
MCHC: 29 g/dL — ABNORMAL LOW (ref 30.0–36.0)
MCV: 93.2 fL (ref 78.0–100.0)
PLATELETS: 416 10*3/uL — AB (ref 150–400)
RBC: 3.37 MIL/uL — AB (ref 4.22–5.81)
RDW: 28.9 % — AB (ref 11.5–15.5)
WBC: 7.9 10*3/uL (ref 4.0–10.5)

## 2017-06-17 NOTE — Consult Note (Signed)
[facility] Nurse wound follow up Wound type: surgical  Measurement: see note from Wednesday Wound bed: CCS PA at bedside to perform CSWD.  Afterwards wound is 90% pink/ 10% slough (right distal edge of wound bed and wound edge) Drainage (amount, consistency, odor) minimal in canister, serosanguinous  Periwound:intact  Dressing procedure/placement/frequency:  3 pc of white foam used (1 in undermined area, 1 in tunneled area, and 1 to fill remainder of the wound bed) 1 pc of mepitel in wound base 1 pc of black foam to fill remainder of wound bed  Silicone foam to the two sites on the abdomen, change every 3 days and PRN soilage.   Cut new ostomy wafer and explained use of barrier ring to bedside nurse. She will change when patient is back in the bed.  Currently up in chair to eat. [place] nurse not able to change with patient up in chair.  Bedside nurse comfortable with pouch change.   [place] nurse team will follow along for ostomy care and M/W/F VAC changes while inpatient.  [place], [place], [place]

## 2017-06-17 NOTE — Progress Notes (Addendum)
  Speech Language Pathology Treatment: Dysphagia  Patient Details Name: Jesse Le MRN: [id number] DOB: Oct 16, 1945 Today's Date: 06/17/2017 Time: 1012-1025 SLP Time Calculation (min) (ACUTE ONLY): 13 min  Assessment / Plan / Recommendation Clinical Impression  Surgery PA-C gave clearance to upgrade to soft/puree if able from dysphagia standpoint. Pt up in chair with improved  demeanor today. Pt coughed during his MBS without evidence of penetration/aspiration and continues to have inconsistent cough during observation with food/liquid. No change in lung sounds noted in RN documentation, he is afebrile and appears to be consuming po's with low risk of aspiration. ST will continue treatment to focus on efficiency with Dys 2 diet (minced-ground) and appropriateness for further upgrade as suspect pt may reject some of Dys 2 textures.    HPI HPI: 72 yo male presented with abdominal pain, weight loss, intermittent hematochezia and found to have 9 cm colon mass from adenocarcinoma of recto-sigmoid colon.  Underwent low anterior resection with primary anastomosis with diverting loop ileostomy.  Developed hypotension, hypoglycemia 5/07 and altered mental status 5/08 and transferred to ICU with septic shock secondary to intraabdominal source (peritonitis vs wound infection). Required intubation 5/22 due to tachycardua and hypoxia. Self extubated 5/28. PMHx of CAD, DM, TAA, MI, HTN. BSE 05/27/17 rec'd D1, thin. MBS 5/31 with findings of primary oral dysphagia; normal pharyngeal phase.  TF were on hold at that time due to abdominal abcess.  Pt now ready to resume PO diet; new orders received.       SLP Plan  Continue with current plan of care       Recommendations  Diet recommendations: Dysphagia 2 (fine chop);Thin liquid Liquids provided via: Cup;Straw Medication Administration: Whole meds with liquid Supervision: Patient able to self feed;Intermittent supervision to cue for compensatory  strategies Compensations: Minimize environmental distractions;Slow rate;Small sips/bites Postural Changes and/or Swallow Maneuvers: Seated upright 90 degrees                Oral Care Recommendations: Oral care BID Follow up Recommendations: Skilled Nursing facility;24 hour supervision/assistance SLP Visit Diagnosis: Dysphagia, oral phase (R13.11) Plan: Continue with current plan of care                       [name] 06/17/2017, 10:37 AM  [name] Colvin Caroli.[employer] [phone]

## 2017-06-17 NOTE — Progress Notes (Signed)
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 05/11/2017 Active Problems: Generalized abdominal pain Iron deficiency anemia due to chronic blood loss Abnormal CT scan, sigmoid colon Acute blood loss anemia Ventral hernia s/p primary repair 05/11/2017 Physical deconditioning Diabetes mellitus without complication (HCC) CAD (coronary atherosclerotic disease) Septic shock (HCC) Hypoglycemia after GI (gastrointestinal) surgery Colostomy present ([place]) Acute respiratory failure with hypoxemia (HCC) Atelectasis Ascites Intra-abdominal abscess (HCC) Pseudomonas infection Pressure injury of skin Goals of care, counseling/discussion Palliative care by specialist  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 - S/PLow anterior resection with primary colorectal anastomosis, diverting loop ileostomy, rigid proctoscopy, umbilical hernia repair 07/11/2456 Dr. Donnie Mesa, -Postop anastomotic leak - IR drain 05/26/17 -Wound dehiscence/possible exposed bowel at the base of the wound. -continueWound VACM/W/F - CT 05/31 showed decrease in size of superior and perihepatic fluid collections, inferior aspect unchanged, stable fluid collections along anterior abdominal wall and LLQ. IR following for RUQ drain.  FEN:FLD, okay to advance to soft/pureed pending speech eval today VTE:per medicine KD:XIPJA 05/10>> per ID. 10 more days. Foley:yes Follow up:Dr. Georgette Dover  DISPO:continue wound vac, FLD and advance to soft/pureed if speech clears, discussed with Lattie Haw this am PT recommending SNF Pt is okay from a surgical standpoint to be discharge to SNF when medically ready per cardiology. Pt will need to be discharge with drain, and with wound vac and IV antibiotics. CBC pending to evaluate Hg in setting of dizziness, Hg 7.8 on  06/05 Diet per speech but pt is okay from a surgical standpoint for soft/reg diet IR following drain   LOS: 42 days    Subjective: CC: dizzy  Pt has pt on the edge of the bed. He denies abdominal pain, nausea or vomiting overnight. No family at bedside.   Objective: Vital signs in last 24 hours: Temp:  [97.4 F (36.3 C)-98.8 F (37.1 C)] 97.6 F (36.4 C) (06/07 0752) Pulse Rate:  [67-85] 67 (06/07 0752) Resp:  [16-24] 19 (06/07 0752) BP: (137-165)/(69-90) 155/88 (06/07 0752) SpO2:  [94 %-100 %] 100 % (06/07 0752) Weight:  [100.2 kg (221 lb)] 100.2 kg (221 lb) (06/07 0437) Last BM Date: 06/16/17  Intake/Output from previous day: 06/06 0701 - 06/07 0700 In: 55 [P.O.:540; IV Piggyback:150] Out: 2320 [Urine:600; Drains:245; SNKNL:9767] Intake/Output this shift: Total I/O In: 500 [P.O.:500] Out: -   PE: Gen: Alert, NAD, cooperative Pulm:Rate andeffort normal Abd: Soft, NT/ND, ileostomywith brown liquid stool and gas in bag, midlinewoundwith vac in place, RUQ drain with yellow purulent drainge Neuro: follows commands, alert and oriented Skin: warm and dry  Anti-infectives: Anti-infectives (From admission, onward)   Start     Dose/Rate Route Frequency Ordered Stop   05/28/17 1200  vancomycin (VANCOCIN) 1,250 mg in sodium chloride 0.9 % 250 mL IVPB  Status:  Discontinued     1,250 mg 166.7 mL/hr over 90 Minutes Intravenous Every 24 hours 05/27/17 1324 05/30/17 1615   05/27/17 1100  vancomycin (VANCOCIN) 1,250 mg in sodium chloride 0.9 % 250 mL IVPB  Status:  Discontinued     1,250 mg 166.7 mL/hr over 90 Minutes Intravenous Every 12 hours 05/27/17 1011 05/27/17 1324   05/20/17 2230  vancomycin (VANCOCIN) 1,250 mg in sodium chloride 0.9 % 250 mL IVPB  Status:  Discontinued     1,250 mg 166.7 mL/hr over 90 Minutes Intravenous Every 24 hours 05/20/17 2151 05/27/17 1011   05/20/17 1400  piperacillin-tazobactam (ZOSYN) IVPB 3.375 g  3.375 g 12.5 mL/hr over 240  Minutes Intravenous Every 8 hours 05/20/17 1044     05/20/17 0500  anidulafungin (ERAXIS) 100 mg in sodium chloride 0.9 % 100 mL IVPB  Status:  Discontinued     100 mg 78 mL/hr over 100 Minutes Intravenous Every 24 hours 05/19/17 0116 05/29/17 1214   05/19/17 2200  vancomycin (VANCOCIN) 1,500 mg in sodium chloride 0.9 % 500 mL IVPB  Status:  Discontinued     1,500 mg 250 mL/hr over 120 Minutes Intravenous Every 24 hours 05/18/17 2126 05/20/17 2150   05/19/17 0130  anidulafungin (ERAXIS) 200 mg in sodium chloride 0.9 % 200 mL IVPB     200 mg 78 mL/hr over 200 Minutes Intravenous  Once 05/19/17 0116 05/19/17 0520   05/19/17 0130  Ampicillin-Sulbactam (UNASYN) 3 g in sodium chloride 0.9 % 100 mL IVPB  Status:  Discontinued     3 g 200 mL/hr over 30 Minutes Intravenous Every 8 hours 05/19/17 0118 05/20/17 1044   05/18/17 2200  vancomycin (VANCOCIN) 1,750 mg in sodium chloride 0.9 % 500 mL IVPB     1,750 mg 250 mL/hr over 120 Minutes Intravenous  Once 05/18/17 2126 05/19/17 0400   05/17/17 0930  piperacillin-tazobactam (ZOSYN) IVPB 3.375 g  Status:  Discontinued     3.375 g 12.5 mL/hr over 240 Minutes Intravenous Every 8 hours 05/17/17 0836 05/19/17 0118   05/11/17 2200  cefoTEtan (CEFOTAN) 2 g in sodium chloride 0.9 % 100 mL IVPB     2 g 200 mL/hr over 30 Minutes Intravenous Every 12 hours 05/11/17 1839 05/11/17 2034   05/11/17 0600  cefoTEtan in Dextrose 5% (CEFOTAN) IVPB 2 g     2 g Intravenous On call to O.R. 05/10/17 1142 05/11/17 1416      Lab Results:  Recent Labs    06/15/17 0333  WBC 9.2  HGB 7.8*  HCT 26.8*  PLT 348   BMET Recent Labs    06/15/17 0333  NA 147*  K 3.7  CL 120*  CO2 24  GLUCOSE 289*  BUN 16  CREATININE 1.61*  CALCIUM 7.9*   PT/INR No results for input(s): LABPROT, INR in the last 72 hours. CMP     Component Value Date/Time   NA 147 (H) 06/15/2017 0333   K 3.7 06/15/2017 0333   CL 120 (H) 06/15/2017 0333   CO2 24 06/15/2017 0333   GLUCOSE  289 (H) 06/15/2017 0333   BUN 16 06/15/2017 0333   CREATININE 1.61 (H) 06/15/2017 0333   CALCIUM 7.9 (L) 06/15/2017 0333   PROT 6.4 (L) 06/15/2017 0333   ALBUMIN 2.0 (L) 06/15/2017 0333   AST 30 06/15/2017 0333   ALT 19 06/15/2017 0333   ALKPHOS 115 06/15/2017 0333   BILITOT 1.2 06/15/2017 0333   GFRNONAA 41 (L) 06/15/2017 0333   GFRAA 48 (L) 06/15/2017 0333   Lipase     Component Value Date/Time   LIPASE 22 05/06/2017 1036    Studies/Results: No results found.    Kalman Drape , [facility] 06/17/2017, 9:20 AM  Pager: [phone] Mon-Wed, Friday 7:00am-4:30pm Thurs 7am-11:30am  Consults: [phone]

## 2017-06-17 NOTE — Progress Notes (Signed)
Subjective:  Appreciate ID reconsult.  Patient denies any chest pain or shortness of breath.  Denies any abdominal pain.  Objective:  Vital Signs in the last 24 hours: Temp:  [97.4 F (36.3 C)-98.8 F (37.1 C)] 97.6 F (36.4 C) (06/07 0752) Pulse Rate:  [67-85] 67 (06/07 0752) Resp:  [16-24] 19 (06/07 0752) BP: (137-165)/(69-90) 155/88 (06/07 0752) SpO2:  [94 %-100 %] 100 % (06/07 0752) Weight:  [100.2 kg (221 lb)] 100.2 kg (221 lb) (06/07 0437)  Intake/Output from previous day: 06/06 0701 - 06/07 0700 In: 1 [P.O.:540; IV Piggyback:150] Out: 2320 [Urine:600; Drains:245; VCBSW:9675] Intake/Output from this shift: Total I/O In: 500 [P.O.:500] Out: -   Physical Exam: Neck: no adenopathy, no carotid bruit, no JVD and supple, symmetrical, trachea midline Lungs: clear to auscultation anterolaterally Heart: regular rate and rhythm, S1, S2 normal and 2/6 systolic murmur noted Abdomen: soft, less distended.  Bowel sounds present.  Ileostomy and wound VAC noted.  JP bulb continues to drain Extremities: extremities normal, atraumatic, no cyanosis or edema  Lab Results: Recent Labs    06/15/17 0333  WBC 9.2  HGB 7.8*  PLT 348   Recent Labs    06/15/17 0333  NA 147*  K 3.7  CL 120*  CO2 24  GLUCOSE 289*  BUN 16  CREATININE 1.61*   No results for input(s): TROPONINI in the last 72 hours.  Invalid input(s): CK, MB Hepatic Function Panel Recent Labs    06/15/17 0333  PROT 6.4*  ALBUMIN 2.0*  AST 30  ALT 19  ALKPHOS 115  BILITOT 1.2   No results for input(s): CHOL in the last 72 hours. No results for input(s): PROTIME in the last 72 hours.  Imaging: Imaging results have been reviewed and No results found.  Cardiac Studies:  Assessment/Plan:  Rectosigmoid adenocarcinoma status post resection with primary colorectal anastomosis anddiverting loop ileostomy and umbilical hernia repair Status postResolving aspiration pneumonia Spontaneous retroperitoneal  hematomastable Abdominalsurgical wound infectionwith bowel exposed. Status postMetabolic encephalopathy Status postSeptichypotensive shock secondary Intra-abdominal sepsis/possible abscess/persistentanastomosis leak Acute on chronic renal injury.improved Status post paroxysmal A. 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 Awaiting skilled nursing facility. Out of bed to chair. Ambulate as tolerated Encourage by mouth intake  LOS: 42 days    Charolette Forward 06/17/2017, 8:59 AM

## 2017-06-17 NOTE — Progress Notes (Signed)
Physical Therapy Treatment Patient Details Name: Jesse Le MRN: [id number] DOB: 24-Sep-1945 Today's Date: 06/17/2017    History of Present Illness Pt is a 72 y.o. 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  He developed sepsis with septic shock from peritonitis. PMH includes HTN, DM, COPD, aortic aneurysm, CKD II, obesity.     PT Comments    Pt admitted with above diagnosis. Pt currently with functional limitations due to balance and endurance deficits. Pt was able to stand with STedy x 2 and transferred to chair with Stedy.  Pt did much better today than last treatment.  Pt did not meet goals originally due to decr cogntion and slower progress than anticipated.  Updated goals today.  Pt will benefit from skilled PT to increase their independence and safety with mobility to allow discharge to the venue listed below.     Follow Up Recommendations  SNF;Supervision/Assistance - 24 hour     Equipment Recommendations  (TBD in next venue)    Recommendations for Other Services       Precautions / Restrictions Precautions Precautions: Fall Precaution Comments: ileostomy bag, open abdominal wound; JP drain.  Wound vac Restrictions Weight Bearing Restrictions: No    Mobility  Bed Mobility Overal bed mobility: Needs Assistance Bed Mobility: Rolling;Sidelying to Sit Rolling: Mod assist;+2 for safety/equipment;+2 for physical assistance Sidelying to sit: Mod assist;+2 for physical assistance       General bed mobility comments: Pt initially resisted rolling, and required assist to initiate movement.  Status varied between min - mod A +2.  Pt required assist to initiate moving LEs off the bed and mod A to lift trunk   Transfers Overall transfer level: Needs assistance Equipment used: Ambulation equipment used Transfers: Sit to/from Stand Sit to Stand: Max  assist;+2 physical assistance         General transfer comment: Pt was able to stand to [facility] with max assist and use of pad with bed raised.  Pt stood x 2 but could not sustain longer than 10 seconds.  Stood x 2 and then used Stedy to move pt to chair.   Ambulation/Gait                 Stairs             Wheelchair Mobility    Modified Rankin (Stroke Patients Only)       Balance Overall balance assessment: Needs assistance Sitting-balance support: Feet supported;No upper extremity supported Sitting balance-[name]: Fair Sitting balance - Comments: EOB sitting with close min guard assist    Standing balance support: Bilateral upper extremity supported Standing balance-[name]: Poor Standing balance comment: Reliant on UE support                             Cognition Arousal/Alertness: Awake/alert;Lethargic Behavior During Therapy: Flat affect Overall Cognitive Status: Impaired/Different from baseline Area of Impairment: Orientation;Attention;Following commands                 Orientation Level: Disoriented to;Time;Situation Current Attention Level: Focused   Following Commands: Follows one step commands with increased time Safety/Judgement: Decreased awareness of safety;Decreased awareness of deficits Awareness: Emergent Problem Solving: Slow processing;Difficulty sequencing;Requires verbal cues;Requires tactile cues General Comments: Pt oriented to self and place but not time or situation. Following one step commands today with delay.  Exercises General Exercises - Lower Extremity Ankle Circles/Pumps: AROM;Both;10 reps;Supine Long Arc Quad: AROM;Both;10 reps;Seated    General Comments General comments (skin integrity, edema, etc.): HR 74-113 bpm, 100 % RA      Pertinent Vitals/Pain Pain Assessment: Faces Faces Pain Scale: Hurts even more Pain Location: left bottom cheek Pain Descriptors / Indicators: Sore Pain  Intervention(s): Repositioned    Home Living                      Prior Function            PT Goals (current goals can now be found in the care plan section) Acute Rehab PT Goals PT Goal Formulation: With patient Time For Goal Achievement: 07/01/17 Potential to Achieve Goals: Fair Progress towards PT goals: Progressing toward goals    Frequency    Min 2X/week      PT Plan Current plan remains appropriate    Co-evaluation              AM-PAC PT "6 Clicks" Daily Activity  Outcome Measure  Difficulty turning over in bed (including adjusting bedclothes, sheets and blankets)?: A Lot Difficulty moving from lying on back to sitting on the side of the bed? : A Lot Difficulty sitting down on and standing up from a chair with arms (e.g., wheelchair, bedside commode, etc,.)?: A Lot Help needed moving to and from a bed to chair (including a wheelchair)?: Total Help needed walking in hospital room?: Total Help needed climbing 3-5 steps with a railing? : Total 6 Click Score: 9    End of Session Equipment Utilized During Treatment: Gait belt Activity Tolerance: Patient limited by fatigue Patient left: with call bell/phone within reach;in chair;with chair alarm set Nurse Communication: Mobility status;Need for lift equipment PT Visit Diagnosis: Other abnormalities of gait and mobility (R26.89)     Time: 7867-6720 PT Time Calculation (min) (ACUTE ONLY): 34 min  Charges:  $Therapeutic Exercise: 8-22 mins $Therapeutic Activity: 8-22 mins                    G Codes:       Kayelee Herbig,PT Acute Rehabilitation [phone] (pager)    Denice Paradise 06/17/2017, 12:25 PM

## 2017-06-17 NOTE — Progress Notes (Signed)
Per night shift report - frequently declining 2 hr repositioning. On air mattress set on alternating setting every 3 minutes. Will continue to offer turning.   Fritz Pickerel, RN

## 2017-06-18 LAB — GLUCOSE, CAPILLARY
GLUCOSE-CAPILLARY: 105 mg/dL — AB (ref 65–99)
Glucose-Capillary: 129 mg/dL — ABNORMAL HIGH (ref 65–99)
Glucose-Capillary: 112 mg/dL — ABNORMAL HIGH (ref 65–99)
Glucose-Capillary: 108 mg/dL — ABNORMAL HIGH (ref 65–99)

## 2017-06-18 LAB — BASIC METABOLIC PANEL
ANION GAP: 5 (ref 5–15)
BUN: 14 mg/dL (ref 6–20)
CHLORIDE: 114 mmol/L — AB (ref 101–111)
CO2: 22 mmol/L (ref 22–32)
Calcium: 7.9 mg/dL — ABNORMAL LOW (ref 8.9–10.3)
Creatinine, Ser: 1.34 mg/dL — ABNORMAL HIGH (ref 0.61–1.24)
GFR calc Af Amer: 59 mL/min — ABNORMAL LOW (ref >60)
GFR calc non Af Amer: 51 mL/min — ABNORMAL LOW (ref >60)
GLUCOSE: 266 mg/dL — AB (ref 65–99)
POTASSIUM: 3.9 mmol/L (ref 3.5–5.1)
Sodium: 141 mmol/L (ref 135–145)

## 2017-06-18 LAB — CBC
HEMATOCRIT: 27.3 % — AB (ref 39.0–52.0)
HEMOGLOBIN: 8.1 g/dL — AB (ref 13.0–17.0)
MCH: 27.3 pg (ref 26.0–34.0)
MCHC: 29.7 g/dL — ABNORMAL LOW (ref 30.0–36.0)
MCV: 91.9 fL (ref 78.0–100.0)
Platelets: 360 10*3/uL (ref 150–400)
RBC: 2.97 MIL/uL — ABNORMAL LOW (ref 4.22–5.81)
RDW: 28.3 % — ABNORMAL HIGH (ref 11.5–15.5)
WBC: 7.7 10*3/uL (ref 4.0–10.5)

## 2017-06-18 MED ORDER — LOPERAMIDE HCL 2 MG PO CAPS
4.0000 mg | ORAL_CAPSULE | Freq: Two times a day (BID) | ORAL | Status: DC
  Administered 2017-06-18 – 2017-06-19 (×4): 4 mg via ORAL
  Filled 2017-06-18 (×4): qty 2

## 2017-06-18 NOTE — Progress Notes (Signed)
Subjective:  More alert and awake denies any chest pain or shortness of breath.patient was up in chair a few hours yesterday.  Objective:  Vital Signs in the last 24 hours: Temp:  [98.2 F (36.8 C)-98.6 F (37 C)] 98.6 F (37 C) (06/08 0753) Pulse Rate:  [77-100] 100 (06/08 0530) Resp:  [19-24] 19 (06/08 0753) BP: (126-155)/(50-90) 126/50 (06/08 0753) SpO2:  [91 %-100 %] 98 % (06/08 0753) Weight:  [98.4 kg (217 lb)] 98.4 kg (217 lb) (06/08 0530)  Intake/Output from previous day: 06/07 0701 - 06/08 0700 In: 1670 [P.O.:1160; I.V.:450; IV Piggyback:50] Out: 2925 [Urine:350; Drains:400; VQXIH:0388] Intake/Output from this shift: No intake/output data recorded.  Physical Exam: Neck: no adenopathy, no carotid bruit, no JVD and supple, symmetrical, trachea midline Lungs: Clear to auscultation anterolaterally Heart: regular rate and rhythm, S1, S2 normal and Soft systolic murmur noted Abdomen: Soft bowel sounds noted ileostomy and wound VAC noted JP bulb drainage noted  Extremities: extremities normal, atraumatic, no cyanosis or edema  Lab Results: Recent Labs    06/17/17 0945 06/18/17 0650  WBC 7.9 7.7  HGB 9.1* 8.1*  PLT 416* 360   Recent Labs    06/17/17 0945 06/18/17 0650  NA 147* 141  K 3.6 3.9  CL 117* 114*  CO2 22 22  GLUCOSE 158* 266*  BUN 14 14  CREATININE 1.53* 1.34*   No results for input(s): TROPONINI in the last 72 hours.  Invalid input(s): CK, MB Hepatic Function Panel No results for input(s): PROT, ALBUMIN, AST, ALT, ALKPHOS, BILITOT, BILIDIR, IBILI in the last 72 hours. No results for input(s): CHOL in the last 72 hours. No results for input(s): PROTIME in the last 72 hours.  Imaging: Imaging results have been reviewed and No results found.  Cardiac Studies:  Assessment/Plan:  Rectosigmoid adenocarcinoma status post resection with primary colorectal anastomosis anddiverting loop ileostomy and umbilical hernia repair Status postResolving  aspiration pneumonia Spontaneous retroperitoneal hematomastable Abdominalsurgical wound infectionwith bowel exposed. Status postMetabolic encephalopathy Status postSeptichypotensive shock secondary Intra-abdominal sepsis/possible abscess/persistentanastomosis leak Acute on chronic renal injury.improved Status post paroxysmal A. 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

## 2017-06-18 NOTE — Plan of Care (Signed)
Care plans reviewed and patient is progressing.  

## 2017-06-18 NOTE — Progress Notes (Signed)
Patient ID: Jesse Le, male   DOB: 06/14/1945, 72 y.o.   MRN: [id number]    38 Days Post-Op  Subjective: Pt tolerating his full liquids well.  Eating all of his ice cream right now.  No nausea.  No complaints currently.  Objective: Vital signs in last 24 hours: Temp:  [98.2 F (36.8 C)-98.6 F (37 C)] 98.6 F (37 C) (06/08 0753) Pulse Rate:  [77-100] 100 (06/08 0530) Resp:  [19-24] 19 (06/08 0753) BP: (126-155)/(50-90) 126/50 (06/08 0753) SpO2:  [91 %-100 %] 98 % (06/08 0753) Weight:  [98.4 kg (217 lb)] 98.4 kg (217 lb) (06/08 0530) Last BM Date: 06/18/17  Intake/Output from previous day: 06/07 0701 - 06/08 0700 In: 1670 [P.O.:1160; I.V.:450; IV Piggyback:50] Out: 2925 [Urine:350; Drains:400; PZWCH:8527] Intake/Output this shift: No intake/output data recorded.  PE: Abd: soft, minimally tender, RUQ drain with tan purulent output.  Ileostomy with appropriate output, but put out 2000cc yesterday, midline wound with VAC in place  Lab Results:  Recent Labs    06/17/17 0945 06/18/17 0650  WBC 7.9 7.7  HGB 9.1* 8.1*  HCT 31.4* 27.3*  PLT 416* 360   BMET Recent Labs    06/17/17 0945 06/18/17 0650  NA 147* 141  K 3.6 3.9  CL 117* 114*  CO2 22 22  GLUCOSE 158* 266*  BUN 14 14  CREATININE 1.53* 1.34*  CALCIUM 8.1* 7.9*   PT/INR No results for input(s): LABPROT, INR in the last 72 hours. CMP     Component Value Date/Time   NA 141 06/18/2017 0650   K 3.9 06/18/2017 0650   CL 114 (H) 06/18/2017 0650   CO2 22 06/18/2017 0650   GLUCOSE 266 (H) 06/18/2017 0650   BUN 14 06/18/2017 0650   CREATININE 1.34 (H) 06/18/2017 0650   CALCIUM 7.9 (L) 06/18/2017 0650   PROT 6.4 (L) 06/15/2017 0333   ALBUMIN 2.0 (L) 06/15/2017 0333   AST 30 06/15/2017 0333   ALT 19 06/15/2017 0333   ALKPHOS 115 06/15/2017 0333   BILITOT 1.2 06/15/2017 0333   GFRNONAA 51 (L) 06/18/2017 0650   GFRAA 59 (L) 06/18/2017 0650   Lipase     Component Value Date/Time   LIPASE 22 05/06/2017  1036       Studies/Results: No results found.  Anti-infectives: Anti-infectives (From admission, onward)   Start     Dose/Rate Route Frequency Ordered Stop   05/28/17 1200  vancomycin (VANCOCIN) 1,250 mg in sodium chloride 0.9 % 250 mL IVPB  Status:  Discontinued     1,250 mg 166.7 mL/hr over 90 Minutes Intravenous Every 24 hours 05/27/17 1324 05/30/17 1615   05/27/17 1100  vancomycin (VANCOCIN) 1,250 mg in sodium chloride 0.9 % 250 mL IVPB  Status:  Discontinued     1,250 mg 166.7 mL/hr over 90 Minutes Intravenous Every 12 hours 05/27/17 1011 05/27/17 1324   05/20/17 2230  vancomycin (VANCOCIN) 1,250 mg in sodium chloride 0.9 % 250 mL IVPB  Status:  Discontinued     1,250 mg 166.7 mL/hr over 90 Minutes Intravenous Every 24 hours 05/20/17 2151 05/27/17 1011   05/20/17 1400  piperacillin-tazobactam (ZOSYN) IVPB 3.375 g     3.375 g 12.5 mL/hr over 240 Minutes Intravenous Every 8 hours 05/20/17 1044     05/20/17 0500  anidulafungin (ERAXIS) 100 mg in sodium chloride 0.9 % 100 mL IVPB  Status:  Discontinued     100 mg 78 mL/hr over 100 Minutes Intravenous Every 24 hours 05/19/17 0116 05/29/17 1214  05/19/17 2200  vancomycin (VANCOCIN) 1,500 mg in sodium chloride 0.9 % 500 mL IVPB  Status:  Discontinued     1,500 mg 250 mL/hr over 120 Minutes Intravenous Every 24 hours 05/18/17 2126 05/20/17 2150   05/19/17 0130  anidulafungin (ERAXIS) 200 mg in sodium chloride 0.9 % 200 mL IVPB     200 mg 78 mL/hr over 200 Minutes Intravenous  Once 05/19/17 0116 05/19/17 0520   05/19/17 0130  Ampicillin-Sulbactam (UNASYN) 3 g in sodium chloride 0.9 % 100 mL IVPB  Status:  Discontinued     3 g 200 mL/hr over 30 Minutes Intravenous Every 8 hours 05/19/17 0118 05/20/17 1044   05/18/17 2200  vancomycin (VANCOCIN) 1,750 mg in sodium chloride 0.9 % 500 mL IVPB     1,750 mg 250 mL/hr over 120 Minutes Intravenous  Once 05/18/17 2126 05/19/17 0400   05/17/17 0930  piperacillin-tazobactam (ZOSYN) IVPB 3.375  g  Status:  Discontinued     3.375 g 12.5 mL/hr over 240 Minutes Intravenous Every 8 hours 05/17/17 0836 05/19/17 0118   05/11/17 2200  cefoTEtan (CEFOTAN) 2 g in sodium chloride 0.9 % 100 mL IVPB     2 g 200 mL/hr over 30 Minutes Intravenous Every 12 hours 05/11/17 1839 05/11/17 2034   05/11/17 0600  cefoTEtan in Dextrose 5% (CEFOTAN) IVPB 2 g     2 g Intravenous On call to O.R. 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 Active Problems: Generalized abdominal pain Iron deficiency anemia due to chronic blood loss Abnormal CT scan, sigmoid colon Acute blood loss anemia Ventral hernia s/p primary repair 05/11/2017 Physical deconditioning Diabetes mellitus without complication (HCC) CAD (coronary atherosclerotic disease) Septic shock (HCC) Hypoglycemia after GI (gastrointestinal) surgery Colostomy present ([place]) Acute respiratory failure with hypoxemia (HCC) Atelectasis Ascites Intra-abdominal abscess (HCC) Pseudomonas infection Pressure injury of skin Goals of care, counseling/discussion Palliative care by specialist  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 - S/PLow anterior resection with primary colorectal anastomosis, diverting loop ileostomy, rigid proctoscopy, umbilical hernia repair 06/17/6193 Dr. Donnie Mesa, -Postop anastomotic leak - IR drain 05/26/17 -Wound dehiscence/possible exposed bowel at the base of the wound. -continueWound VACM/W/F -IR following for RUQ drain. -tolerating dysphagia 2 diet really well. -ileostomy output is quite high at 2L a day right now.  Will add a little imodium to bring it down some before he gets into issues with high output like he did several weeks ago.  KDT:OIZTIWPYK 2 with thin liquids per speech DXI:PJAS NK:NLZJQ 05/10>> per ID. 10 more days. Follow up:Dr.  Georgette Dover  DISPO:continue wound vac Ok for SNF from our standpoint Imodium today to decrease ostomy output some since he is now on a "solid" diet   LOS: 43 days    [name] , [facility] 06/18/2017, 8:27 AM Pager: [phone]

## 2017-06-18 NOTE — Social Work (Signed)
CSW continuing to follow for discharge, now that pt has been transitioned from [facility] and is progressing in diet SNF placement is appropriate.  CSW to update FL2 for placement.  Alexander Mt, [place] Work [phone]

## 2017-06-18 NOTE — NC FL2 (Signed)
[place] LEVEL OF CARE SCREENING TOOL     IDENTIFICATION  Patient Name: Jesse Le Birthdate: 1945/06/09 Sex: male Admission Date (Current Location): 05/06/2017  [facility] and [place] Number:  Herbalist and Address:  [facility] [place] [place], [place] [address] [place], [place] [address]      Provider Number: [id number]  Attending Physician Name and Address:  Charolette Forward, MD  Relative Name and Phone Number:  Aram Beecham; granddaughter; [phone]    Current Level of Care: Hospital Recommended Level of Care: [place] Prior Approval Number:    Date Approved/Denied:   PASRR Number: [id number]  Discharge Plan: SNF    Current Diagnoses: Patient Active Problem List   Diagnosis Date Noted  . Goals of care, counseling/discussion   . Palliative care by specialist   . Pressure injury of skin 06/09/2017  . Ascites   . Intra-abdominal abscess ([place])   . Pseudomonas infection   . Acute respiratory failure with hypoxemia ([place])   . Atelectasis   . Septic shock ([place])   . Hypoglycemia after GI (gastrointestinal) surgery   . Colostomy present ([place])   . Ventral hernia s/p primary repair 05/11/2017 05/14/2017  . Physical deconditioning 05/14/2017  . CAD (coronary atherosclerotic disease) 05/14/2017  . Diabetes mellitus without complication ([place])   . Acute blood loss anemia   . 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 Iron deficiency anemia due to chronic blood loss   . Abnormal CT scan, sigmoid colon   . Generalized abdominal pain 05/06/2017  . 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 Aftercare following surgery of the circulatory system, NEC 11/22/2011    Orientation RESPIRATION BLADDER Height & Weight     Self, Time, Place  Normal Incontinent, External catheter Weight: 217 lb (98.4 kg) Height:  5\' 10"  (177.8 cm)  BEHAVIORAL SYMPTOMS/MOOD NEUROLOGICAL BOWEL NUTRITION STATUS      Incontinent,  Ileostomy(placed 05/11/17) Diet(see DC summary)  AMBULATORY STATUS COMMUNICATION OF NEEDS Skin   Extensive Assist Verbally Surgical wounds                       Personal Care Assistance Level of Assistance  Bathing, Dressing, Feeding Bathing Assistance: Maximum assistance Feeding assistance: Limited assistance Dressing Assistance: Maximum assistance     Functional Limitations Info  Sight, Hearing, Speech Sight Info: Adequate Hearing Info: Adequate Speech Info: Adequate    SPECIAL CARE FACTORS FREQUENCY  PT (By licensed PT), OT (By licensed OT)     PT Frequency: 5/wk OT Frequency: 5/wk            Contractures Contractures Info: Not present    Additional Factors Info  Code Status, Allergies, Insulin Sliding Scale Code Status Info: FULL Allergies Info: NKA   Insulin Sliding Scale Info: 3/day insulin aspart (novoLOG) injection 0-15 Units        Current Medications (06/18/2017):  This is the current hospital active medication list Current Facility-Administered Medications  Medication Dose Route Frequency Provider Last Rate Last Dose  . 0.9 %  sodium chloride infusion   Intravenous Continuous Charolette Forward, MD 10 mL/hr at 06/15/17 1600    . acetaminophen (TYLENOL) solution 650 mg  650 mg Oral Q8H PRN Greer Pickerel, MD   650 mg at 06/18/17 0525  . chlorhexidine (PERIDEX) 0.12 % solution 15 mL  15 mL Mouth Rinse BID Charolette Forward, MD   15 mL at 06/15/17 2201  . collagenase (SANTYL) ointment  Topical Daily Charolette Forward, MD      . dextrose 5 % and 0.45 % NaCl with KCl 10 mEq/L infusion   Intravenous Continuous Charolette Forward, MD 50 mL/hr at 06/17/17 2050    . famotidine (PEPCID) tablet 40 mg  40 mg Oral Daily Sloan Leiter B, RPH   40 mg at 06/17/17 1016  . feeding supplement (ENSURE ENLIVE) (ENSURE ENLIVE) liquid 237 mL  237 mL Oral BID BM Focht, Jessica L, PA   237 mL at 06/17/17 1351  . feeding supplement (PRO-STAT SUGAR FREE 64) liquid 30 mL  30 mL Oral BID Sloan Leiter B, RPH   30 mL at 06/17/17 1016  . Gerhardt's butt cream   Topical PRN Charolette Forward, MD      . hydrocortisone (ANUSOL-HC) 2.5 % rectal cream 1 application  1 application Topical QID PRN Michael Boston, MD      . hydrocortisone cream 1 % 1 application  1 application Topical TID PRN Michael Boston, MD      . insulin aspart (novoLOG) injection 0-15 Units  0-15 Units Subcutaneous TID AC & HS Charolette Forward, MD   2 Units at 06/18/17 0641  . lip balm (CARMEX) ointment 1 application  1 application Topical BID Michael Boston, MD   1 application at 19/37/90 2150  . loperamide (IMODIUM) capsule 4 mg  4 mg Oral BID Saverio Danker, PA-C      . MEDLINE mouth rinse  15 mL Mouth Rinse q12n4p Charolette Forward, MD   15 mL at 06/17/17 1300  . metoprolol tartrate (LOPRESSOR) tablet 25 mg  25 mg Oral BID Charolette Forward, MD   25 mg at 06/17/17 2147  . ondansetron (ZOFRAN) injection 4 mg  4 mg Intravenous Q6H PRN Donnie Mesa, MD   4 mg at 06/04/17 1938  . piperacillin-tazobactam (ZOSYN) IVPB 3.375 g  3.375 g Intravenous Q8H Charolette Forward, MD 12.5 mL/hr at 06/18/17 0211    . prochlorperazine (COMPAZINE) injection 5-10 mg  5-10 mg Intravenous Q4H PRN Michael Boston, MD      . sodium chloride flush (NS) 0.9 % injection 10-40 mL  10-40 mL Intracatheter Q12H Charolette Forward, MD   10 mL at 06/17/17 0945  . sodium chloride flush (NS) 0.9 % injection 10-40 mL  10-40 mL Intracatheter PRN Charolette Forward, MD      . sodium chloride flush (NS) 0.9 % injection 5 mL  5 mL Intracatheter Q8H Sandi Mariscal, MD   5 mL at 06/18/17 0454  . vitamin C (ASCORBIC ACID) tablet 500 mg  500 mg Oral Daily Priscella Mann, RPH   500 mg at 06/17/17 1016     Discharge Medications: Please see discharge summary for a list of discharge medications.  Relevant Imaging Results:  Relevant Lab Results:   Additional Information SS#: [place], [place]

## 2017-06-19 LAB — GLUCOSE, CAPILLARY
GLUCOSE-CAPILLARY: 123 mg/dL — AB (ref 65–99)
GLUCOSE-CAPILLARY: 89 mg/dL (ref 65–99)
Glucose-Capillary: 85 mg/dL (ref 65–99)

## 2017-06-19 NOTE — Plan of Care (Signed)
Care plans reviewed and patient is progressing.  

## 2017-06-19 NOTE — Progress Notes (Signed)
Patient ID: Jesse Le, male   DOB: 28-Jan-1945, 72 y.o.   MRN: [id number]    39 Days Post-Op  Subjective: Pt is confused.  Objective: Vital signs in last 24 hours: Temp:  [97.6 F (36.4 C)-99.6 F (37.6 C)] 98.4 F (36.9 C) (06/09 0832) Pulse Rate:  [69-85] 78 (06/09 0832) Resp:  [18-30] 21 (06/09 0832) BP: (110-154)/(72-93) 110/93 (06/09 0832) SpO2:  [90 %-100 %] 98 % (06/09 0832) Weight:  [99.1 kg (218 lb 7.6 oz)] 99.1 kg (218 lb 7.6 oz) (06/09 0410) Last BM Date: 06/18/17  Intake/Output from previous day: 06/08 0701 - 06/09 0700 In: 2930 [P.O.:920; I.V.:1900; IV Piggyback:100] Out: 1515 [Urine:650; Drains:65; Stool:800] Intake/Output this shift: No intake/output data recorded.  PE: Abd: wound VAC output is thin, brown in nature.  Patient refuses to let me examine his abdomen.  He will let me palpate it over his gown and seems NT  Lab Results:  Recent Labs    06/17/17 0945 06/18/17 0650  WBC 7.9 7.7  HGB 9.1* 8.1*  HCT 31.4* 27.3*  PLT 416* 360   BMET Recent Labs    06/17/17 0945 06/18/17 0650  NA 147* 141  K 3.6 3.9  CL 117* 114*  CO2 22 22  GLUCOSE 158* 266*  BUN 14 14  CREATININE 1.53* 1.34*  CALCIUM 8.1* 7.9*   PT/INR No results for input(s): LABPROT, INR in the last 72 hours. CMP     Component Value Date/Time   NA 141 06/18/2017 0650   K 3.9 06/18/2017 0650   CL 114 (H) 06/18/2017 0650   CO2 22 06/18/2017 0650   GLUCOSE 266 (H) 06/18/2017 0650   BUN 14 06/18/2017 0650   CREATININE 1.34 (H) 06/18/2017 0650   CALCIUM 7.9 (L) 06/18/2017 0650   PROT 6.4 (L) 06/15/2017 0333   ALBUMIN 2.0 (L) 06/15/2017 0333   AST 30 06/15/2017 0333   ALT 19 06/15/2017 0333   ALKPHOS 115 06/15/2017 0333   BILITOT 1.2 06/15/2017 0333   GFRNONAA 51 (L) 06/18/2017 0650   GFRAA 59 (L) 06/18/2017 0650   Lipase     Component Value Date/Time   LIPASE 22 05/06/2017 1036       Studies/Results: No results found.  Anti-infectives: Anti-infectives (From  admission, onward)   Start     Dose/Rate Route Frequency Ordered Stop   05/28/17 1200  vancomycin (VANCOCIN) 1,250 mg in sodium chloride 0.9 % 250 mL IVPB  Status:  Discontinued     1,250 mg 166.7 mL/hr over 90 Minutes Intravenous Every 24 hours 05/27/17 1324 05/30/17 1615   05/27/17 1100  vancomycin (VANCOCIN) 1,250 mg in sodium chloride 0.9 % 250 mL IVPB  Status:  Discontinued     1,250 mg 166.7 mL/hr over 90 Minutes Intravenous Every 12 hours 05/27/17 1011 05/27/17 1324   05/20/17 2230  vancomycin (VANCOCIN) 1,250 mg in sodium chloride 0.9 % 250 mL IVPB  Status:  Discontinued     1,250 mg 166.7 mL/hr over 90 Minutes Intravenous Every 24 hours 05/20/17 2151 05/27/17 1011   05/20/17 1400  piperacillin-tazobactam (ZOSYN) IVPB 3.375 g     3.375 g 12.5 mL/hr over 240 Minutes Intravenous Every 8 hours 05/20/17 1044     05/20/17 0500  anidulafungin (ERAXIS) 100 mg in sodium chloride 0.9 % 100 mL IVPB  Status:  Discontinued     100 mg 78 mL/hr over 100 Minutes Intravenous Every 24 hours 05/19/17 0116 05/29/17 1214   05/19/17 2200  vancomycin (VANCOCIN) 1,500 mg in  sodium chloride 0.9 % 500 mL IVPB  Status:  Discontinued     1,500 mg 250 mL/hr over 120 Minutes Intravenous Every 24 hours 05/18/17 2126 05/20/17 2150   05/19/17 0130  anidulafungin (ERAXIS) 200 mg in sodium chloride 0.9 % 200 mL IVPB     200 mg 78 mL/hr over 200 Minutes Intravenous  Once 05/19/17 0116 05/19/17 0520   05/19/17 0130  Ampicillin-Sulbactam (UNASYN) 3 g in sodium chloride 0.9 % 100 mL IVPB  Status:  Discontinued     3 g 200 mL/hr over 30 Minutes Intravenous Every 8 hours 05/19/17 0118 05/20/17 1044   05/18/17 2200  vancomycin (VANCOCIN) 1,750 mg in sodium chloride 0.9 % 500 mL IVPB     1,750 mg 250 mL/hr over 120 Minutes Intravenous  Once 05/18/17 2126 05/19/17 0400   05/17/17 0930  piperacillin-tazobactam (ZOSYN) IVPB 3.375 g  Status:  Discontinued     3.375 g 12.5 mL/hr over 240 Minutes Intravenous Every 8 hours  05/17/17 0836 05/19/17 0118   05/11/17 2200  cefoTEtan (CEFOTAN) 2 g in sodium chloride 0.9 % 100 mL IVPB     2 g 200 mL/hr over 30 Minutes Intravenous Every 12 hours 05/11/17 1839 05/11/17 2034   05/11/17 0600  cefoTEtan in Dextrose 5% (CEFOTAN) IVPB 2 g     2 g Intravenous On call to O.R. 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 Active Problems: Generalized abdominal pain Iron deficiency anemia due to chronic blood loss Abnormal CT scan, sigmoid colon Acute blood loss anemia Ventral hernia s/p primary repair 05/11/2017 Physical deconditioning Diabetes mellitus without complication (HCC) CAD (coronary atherosclerotic disease) Septic shock (HCC) Hypoglycemia after GI (gastrointestinal) surgery Colostomy present ([place]) Acute respiratory failure with hypoxemia (HCC) Atelectasis Ascites Intra-abdominal abscess (HCC) Pseudomonas infection Pressure injury of skin Goals of care, counseling/discussion Palliative care by specialist  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 - S/PLow anterior resection with primary colorectal anastomosis, diverting loop ileostomy, rigid proctoscopy, umbilical hernia repair 04/13/8766 Dr. Donnie Mesa, -Postop anastomotic leak - IR drain 05/26/17 -Wound dehiscence/possible exposed bowel at the base of the wound. -continueWound VACM/W/F -IR following for RUQ drain. -tolerating dysphagia 2 diet really well. -ileostomy output is down to 1L after initiation of imodium. Follow output.  TLX:BWIOMBTDH 2 with thin liquids per speech RCB:ULAG TX:MIWOE 05/10>>per ID. 7 more days. Follow up:Dr. Georgette Dover  DISPO:continue wound vac Ok for SNF from our standpoint Imodium  to decrease ostomy output some since he is now on a "solid" diet     LOS: 44 days    [name] , [facility] 06/19/2017, 9:15 AM Pager: [phone]

## 2017-06-19 NOTE — Progress Notes (Signed)
Subjective:  Patient more alert and awake denies any complaints.  Objective:  Vital Signs in the last 24 hours: Temp:  [97.6 F (36.4 C)-99.6 F (37.6 C)] 98.2 F (36.8 C) (06/09 1251) Pulse Rate:  [69-85] 73 (06/09 1251) Resp:  [16-30] 26 (06/09 1251) BP: (110-154)/(64-93) 127/74 (06/09 1251) SpO2:  [91 %-100 %] 98 % (06/09 1251) Weight:  [99.1 kg (218 lb 7.6 oz)] 99.1 kg (218 lb 7.6 oz) (06/09 0410)  Intake/Output from previous day: 06/08 0701 - 06/09 0700 In: 2930 [P.O.:920; I.V.:1900; IV Piggyback:100] Out: 1515 [Urine:650; Drains:65; Stool:800] Intake/Output from this shift: Total I/O In: 1740 [P.O.:740; I.V.:850; IV Piggyback:150] Out: 1125 [Drains:150; Stool:975]  Physical Exam: Exam unchanged  Lab Results: Recent Labs    06/17/17 0945 06/18/17 0650  WBC 7.9 7.7  HGB 9.1* 8.1*  PLT 416* 360   Recent Labs    06/17/17 0945 06/18/17 0650  NA 147* 141  K 3.6 3.9  CL 117* 114*  CO2 22 22  GLUCOSE 158* 266*  BUN 14 14  CREATININE 1.53* 1.34*   No results for input(s): TROPONINI in the last 72 hours.  Invalid input(s): CK, MB Hepatic Function Panel No results for input(s): PROT, ALBUMIN, AST, ALT, ALKPHOS, BILITOT, BILIDIR, IBILI in the last 72 hours. No results for input(s): CHOL in the last 72 hours. No results for input(s): PROTIME in the last 72 hours.  Imaging: Imaging results have been reviewed and No results found.  Cardiac Studies:  Assessment/Plan:  Rectosigmoid adenocarcinoma status post resection with primary colorectal anastomosis anddiverting loop ileostomy and umbilical hernia repair Status postResolving aspiration pneumonia Spontaneous retroperitoneal hematomastable Abdominalsurgical wound infectionwith bowel exposed. Status postMetabolic encephalopathy Status postSeptichypotensive shock secondary Intra-abdominal sepsis/possible abscess/persistentanastomosis leak Acute on chronic renal injury.improved Status post paroxysmal  A. 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   LOS: 44 days    Jesse Le 06/19/2017, 2:47 PM

## 2017-06-20 LAB — CBC
HCT: 28.5 % — ABNORMAL LOW (ref 39.0–52.0)
Hemoglobin: 8.5 g/dL — ABNORMAL LOW (ref 13.0–17.0)
MCH: 27.9 pg (ref 26.0–34.0)
MCHC: 29.8 g/dL — ABNORMAL LOW (ref 30.0–36.0)
MCV: 93.4 fL (ref 78.0–100.0)
Platelets: 397 10*3/uL (ref 150–400)
RBC: 3.05 MIL/uL — AB (ref 4.22–5.81)
RDW: 27.5 % — AB (ref 11.5–15.5)
WBC: 9 10*3/uL (ref 4.0–10.5)

## 2017-06-20 LAB — COMPREHENSIVE METABOLIC PANEL
ALBUMIN: 1.7 g/dL — AB (ref 3.5–5.0)
ALT: 27 U/L (ref 17–63)
AST: 54 U/L — AB (ref 15–41)
Alkaline Phosphatase: 194 U/L — ABNORMAL HIGH (ref 38–126)
Anion gap: 7 (ref 5–15)
BUN: 11 mg/dL (ref 6–20)
CHLORIDE: 111 mmol/L (ref 101–111)
CO2: 22 mmol/L (ref 22–32)
CREATININE: 1.25 mg/dL — AB (ref 0.61–1.24)
Calcium: 8.1 mg/dL — ABNORMAL LOW (ref 8.9–10.3)
GFR calc Af Amer: >60 mL/min (ref >60)
GFR, EST NON AFRICAN AMERICAN: 56 mL/min — AB (ref >60)
Glucose, Bld: 79 mg/dL (ref 65–99)
POTASSIUM: 4 mmol/L (ref 3.5–5.1)
SODIUM: 140 mmol/L (ref 135–145)
Total Bilirubin: 1.1 mg/dL (ref 0.3–1.2)
Total Protein: 6.3 g/dL — ABNORMAL LOW (ref 6.5–8.1)

## 2017-06-20 LAB — GLUCOSE, CAPILLARY
GLUCOSE-CAPILLARY: 109 mg/dL — AB (ref 65–99)
GLUCOSE-CAPILLARY: 91 mg/dL (ref 65–99)
GLUCOSE-CAPILLARY: 100 mg/dL — AB (ref 65–99)
Glucose-Capillary: 125 mg/dL — ABNORMAL HIGH (ref 65–99)
Glucose-Capillary: 133 mg/dL — ABNORMAL HIGH (ref 65–99)

## 2017-06-20 MED ORDER — PRO-STAT SUGAR FREE PO LIQD
30.0000 mL | Freq: Three times a day (TID) | ORAL | Status: DC
  Administered 2017-06-21 (×2): 30 mL via ORAL
  Filled 2017-06-20: qty 30

## 2017-06-20 MED ORDER — LOPERAMIDE HCL 2 MG PO CAPS
4.0000 mg | ORAL_CAPSULE | Freq: Three times a day (TID) | ORAL | Status: DC
  Administered 2017-06-20 – 2017-06-21 (×5): 4 mg via ORAL
  Filled 2017-06-20 (×4): qty 2

## 2017-06-20 NOTE — Progress Notes (Signed)
Subjective:  Patient denies any chest pain or shortness of breath. Denies abdominal pain. Appetite remains poor  Objective:  Vital Signs in the last 24 hours: Temp:  [97.3 F (36.3 C)-98.8 F (37.1 C)] 98.4 F (36.9 C) (06/10 0814) Pulse Rate:  [71-187] 73 (06/10 0814) Resp:  [16-26] 18 (06/10 0814) BP: (127-160)/(55-77) 144/60 (06/10 0814) SpO2:  [82 %-100 %] 97 % (06/10 0814) Weight:  [99.8 kg (220 lb)] 99.8 kg (220 lb) (06/10 0450)  Intake/Output from previous day: 06/09 0701 - 06/10 0700 In: 2545 [P.O.:840; I.V.:1500; IV Piggyback:200] Out: 1224 [Urine:2175; Drains:250; MGNOI:3704] Intake/Output from this shift: Total I/O In: 290 [P.O.:240; Other:50] Out: -   Physical Exam: Neck: no adenopathy, no carotid bruit, no JVD and supple, symmetrical, trachea midline Lungs: Clear to auscultation anterolaterally Heart: regular rate and rhythm, S1, S2 normal and Soft systolic murmur noted  Abdomen: Soft mildly distended bowel sounds present ileostomy and one VAC noted JP drain still with purulent drainage Extremities: extremities normal, atraumatic, no cyanosis or edema  Lab Results: Recent Labs    06/18/17 0650 06/20/17 0500  WBC 7.7 9.0  HGB 8.1* 8.5*  PLT 360 397   Recent Labs    06/18/17 0650 06/20/17 0500  NA 141 140  K 3.9 4.0  CL 114* 111  CO2 22 22  GLUCOSE 266* 79  BUN 14 11  CREATININE 1.34* 1.25*   No results for input(s): TROPONINI in the last 72 hours.  Invalid input(s): CK, MB Hepatic Function Panel Recent Labs    06/20/17 0500  PROT 6.3*  ALBUMIN 1.7*  AST 54*  ALT 27  ALKPHOS 194*  BILITOT 1.1   No results for input(s): CHOL in the last 72 hours. No results for input(s): PROTIME in the last 72 hours.  Imaging: Imaging results have been reviewed and No results found.  Cardiac Studies:  Assessment/Plan:  Rectosigmoid adenocarcinoma status post resection with primary colorectal anastomosis anddiverting loop ileostomy and umbilical  hernia repair Status postResolving aspiration pneumonia Spontaneous retroperitoneal hematoma stable Abdominalsurgical wound infection healing well. Status postMetabolic encephalopathy Status postSeptichypotensive shock secondary Intra-abdominal sepsis/possible abscess/persistentanastomosis leak Acute on chronic renal injury.improved Status post paroxysmal A. 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

## 2017-06-20 NOTE — Progress Notes (Signed)
Physical Therapy Treatment Patient Details Name: Jesse Le MRN: [id number] DOB: Oct 23, 1945 Today's Date: 06/20/2017    History of Present Illness Pt is a 72 y.o. 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  He developed sepsis with septic shock from peritonitis. PMH includes HTN, DM, COPD, aortic aneurysm, CKD II, obesity.     PT Comments    Pt admitted with above diagnosis. Pt currently with functional limitations due to the deficits listed below (see PT Problem List). Pt was able to stand to [facility] but had episode of incr HRto 139 bpm and pt was having difficulty sitting up in [place].  Moved the pads of Stedy and laid pt back down.  BP dropped to 107/55 from 154/90 therefore ?orthostatic.  Will continue to follow.   Pt will benefit from skilled PT to increase their independence and safety with mobility to allow discharge to the venue listed below.     Follow Up Recommendations  SNF;Supervision/Assistance - 24 hour     Equipment Recommendations  (TBD in next venue)    Recommendations for Other Services       Precautions / Restrictions Precautions Precautions: Fall Precaution Comments: ileostomy bag, open abdominal wound; JP drain.  Wound vac Required Braces or Orthoses: Other Brace/Splint Restrictions Weight Bearing Restrictions: No    Mobility  Bed Mobility Overal bed mobility: Needs Assistance Bed Mobility: Rolling;Sidelying to Sit Rolling: Mod assist;+2 for safety/equipment;+2 for physical assistance Sidelying to sit: Mod assist;+2 for physical assistance       General bed mobility comments: Pt initially resisted rolling, and required assist to initiate movement.  Status varied between min - mod A +2.  Pt required assist to initiate moving LEs off the bed and mod A to lift trunk   Transfers Overall transfer level: Needs assistance Equipment  used: Ambulation equipment used Transfers: Sit to/from Stand Sit to Stand: Max assist;+2 physical assistance         General transfer comment: Pt was able to stand to [facility] with max assist and use of pad with bed raised.  Pt stood x 1.  Pt reported fatigue and leaned down on the [facility] bar.  Could not get pt aroused well even though eyes remained opened and he was talking.  Moved the pads on the stedy and nursing came in to assist PT with getting pt back into bed and situated.  Pt BP 154/90 initially and then during sitting 107/55.  At end of treatment once settled, pt BP 120/66.  Pt HR 110 -139 bpm during session.    Ambulation/Gait                 Stairs             Wheelchair Mobility    Modified Rankin (Stroke Patients Only)       Balance Overall balance assessment: Needs assistance Sitting-balance support: Feet supported;No upper extremity supported Sitting balance-[name]: Fair Sitting balance - Comments: EOB sitting with close min guard assist initially and then progressed to mod to max assist during episode.    Standing balance support: Bilateral upper extremity supported Standing balance-[name]: Poor Standing balance comment: Reliant on UE support                             Cognition Arousal/Alertness: Awake/alert;Lethargic Behavior During Therapy: Flat affect Overall Cognitive Status: Impaired/Different from baseline  Area of Impairment: Orientation;Attention;Following commands                 Orientation Level: Disoriented to;Time;Situation Current Attention Level: Focused   Following Commands: Follows one step commands with increased time Safety/Judgement: Decreased awareness of safety;Decreased awareness of deficits Awareness: Emergent Problem Solving: Slow processing;Difficulty sequencing;Requires verbal cues;Requires tactile cues General Comments: Pt oriented to self and place but not time or situation. Following one  step commands today with delay.       Exercises      General Comments        Pertinent Vitals/Pain Pain Assessment: Faces Faces Pain Scale: Hurts even more Pain Location: left bottom cheek Pain Descriptors / Indicators: Sore Pain Intervention(s): Limited activity within patient's tolerance;Monitored during session;Repositioned    Home Living                      Prior Function            PT Goals (current goals can now be found in the care plan section) Acute Rehab PT Goals Patient Stated Goal: non stated Progress towards PT goals: Progressing toward goals    Frequency    Min 2X/week      PT Plan Current plan remains appropriate    Co-evaluation              AM-PAC PT "6 Clicks" Daily Activity  Outcome Measure  Difficulty turning over in bed (including adjusting bedclothes, sheets and blankets)?: A Lot Difficulty moving from lying on back to sitting on the side of the bed? : A Lot Difficulty sitting down on and standing up from a chair with arms (e.g., wheelchair, bedside commode, etc,.)?: A Lot Help needed moving to and from a bed to chair (including a wheelchair)?: Total Help needed walking in hospital room?: Total Help needed climbing 3-5 steps with a railing? : Total 6 Click Score: 9    End of Session Equipment Utilized During Treatment: Gait belt Activity Tolerance: Patient limited by fatigue Patient left: in bed;with call bell/phone within reach;with SCD's reapplied Nurse Communication: Mobility status;Need for lift equipment PT Visit Diagnosis: Other abnormalities of gait and mobility (R26.89)     Time: 2330-0762 PT Time Calculation (min) (ACUTE ONLY): 31 min  Charges:  $Therapeutic Activity: 23-37 mins                    G Codes:       Jesse Le,PT Acute Rehabilitation [phone] (pager)    Denice Paradise 06/20/2017, 4:22 PM

## 2017-06-20 NOTE — Progress Notes (Signed)
Nutrition Follow-up  DOCUMENTATION CODES:   Not applicable  INTERVENTION:    Ensure Enlive po BID, each supplement provides 350 kcal and 20 grams of protein  Prostat liquid protein po 30 ml TID with meals, each supplement provides 100 kcal, 15 grams protein  NUTRITION DIAGNOSIS:   Increased nutrient needs related to chronic illness(colon cancer) as evidenced by estimated needs, ongoing  GOAL:   Patient will meet greater than or equal to 90% of their needs, progressing  MONITOR:   PO intake, Supplement acceptance, Labs, Skin, Weight trends, I & O's  ASSESSMENT:   72 year old male here with iron deficiency anemia with recent rectal bleeding found to have abnormal CT scan of the abdomen and pelvis suggestive of a sigmoid cancer.  He has a history of peripheral vascular disease, aortic aneurysm with endovascular repair, CAD, hypertension, hyperlipidemia, COPD, CKD.  5/22 pt re-intubated, Cortrak inserted tip in stomach 5/23 spoke with Hulen Skains, ok to start TF 5/24 TPN weaned and d/c 5/28 self-extubated 5/29 wound VAC placed  Nurse Tech in with patient upon RD visit. PO diet resumed 6/5 to Full Liquids. S/p MBSS today. SLP rec Dys 3-thin liquids. PO intake poor at 0-25% per flowsheet records. TF D/C'd. Cortrak feeding tube is out.   Ensure Enlive supplements ordered BID 6/6. CWOCN note reviewed. Medications include Imodium, Vitamin C and Pepcid. Labs reviewed. CBG's [id number].  Diet Order:   Diet Order           DIET DYS 3 Room service appropriate? Yes; Fluid consistency: Thin  Diet effective now         EDUCATION NEEDS:   Not appropriate for education at this time  Skin:  Skin Assessment: Skin Integrity Issues: Skin Integrity Issues:: Other (Comment) Unstageable: sacral pressure injury Wound Vac: to abdomen for full thickness post-op wound  Last BM:  6/9 ileostomy  Height:   Ht Readings from Last 1 Encounters:  06/15/17 5\' 10"  (1.778 m)   Weight:   Wt  Readings from Last 1 Encounters:  06/20/17 220 lb (99.8 kg)    Intake/Output Summary (Last 24 hours) at 06/20/2017 1240 Last data filed at 06/20/2017 1058 Gross per 24 hour  Intake 2015 ml  Output 3750 ml  Net -1735 ml   Ideal Body Weight:  83.6 kg  BMI:  Body mass index is 31.57 kg/m.  Estimated Nutritional Needs:   Kcal:  2150-2350  Protein:  110-125 gm  Fluid:  2.1-2.3 L  Arthur Holms, RD, LDN Pager #: [phone] After-Hours Pager #: [phone]

## 2017-06-20 NOTE — Progress Notes (Signed)
Patient ID: Jesse Le, male   DOB: 03/26/1945, 72 y.o.   MRN: [id number]    40 Days Post-Op  Subjective: Pt sleeping.  Just had VAC changed.  Per WOC, RN, wound is granulating well and clean.    Objective: Vital signs in last 24 hours: Temp:  [97.3 F (36.3 C)-98.8 F (37.1 C)] 98.4 F (36.9 C) (06/10 0814) Pulse Rate:  [71-187] 73 (06/10 0814) Resp:  [16-26] 18 (06/10 0814) BP: (127-160)/(55-77) 144/60 (06/10 0814) SpO2:  [82 %-100 %] 97 % (06/10 0814) Weight:  [99.8 kg (220 lb)] 99.8 kg (220 lb) (06/10 0450) Last BM Date: 06/19/17  Intake/Output from previous day: 06/09 0701 - 06/10 0700 In: 2545 [P.O.:840; I.V.:1500; IV Piggyback:200] Out: 2353 [Urine:2175; Drains:250; IRWER:1540] Intake/Output this shift: No intake/output data recorded.  PE: Abd: soft, NT, RUQ JP with seropurulent output, about 50cc in bulb currently.  Wound VAC in place.  Ileostomy with 1600cc out yesterday.  Bag just changed as well so no output currently.  Lab Results:  Recent Labs    06/18/17 0650 06/20/17 0500  WBC 7.7 9.0  HGB 8.1* 8.5*  HCT 27.3* 28.5*  PLT 360 397   BMET Recent Labs    06/18/17 0650 06/20/17 0500  NA 141 140  K 3.9 4.0  CL 114* 111  CO2 22 22  GLUCOSE 266* 79  BUN 14 11  CREATININE 1.34* 1.25*  CALCIUM 7.9* 8.1*   PT/INR No results for input(s): LABPROT, INR in the last 72 hours. CMP     Component Value Date/Time   NA 140 06/20/2017 0500   K 4.0 06/20/2017 0500   CL 111 06/20/2017 0500   CO2 22 06/20/2017 0500   GLUCOSE 79 06/20/2017 0500   BUN 11 06/20/2017 0500   CREATININE 1.25 (H) 06/20/2017 0500   CALCIUM 8.1 (L) 06/20/2017 0500   PROT 6.3 (L) 06/20/2017 0500   ALBUMIN 1.7 (L) 06/20/2017 0500   AST 54 (H) 06/20/2017 0500   ALT 27 06/20/2017 0500   ALKPHOS 194 (H) 06/20/2017 0500   BILITOT 1.1 06/20/2017 0500   GFRNONAA 56 (L) 06/20/2017 0500   GFRAA >60 06/20/2017 0500   Lipase     Component Value Date/Time   LIPASE 22 05/06/2017 1036        Studies/Results: No results found.  Anti-infectives: Anti-infectives (From admission, onward)   Start     Dose/Rate Route Frequency Ordered Stop   05/28/17 1200  vancomycin (VANCOCIN) 1,250 mg in sodium chloride 0.9 % 250 mL IVPB  Status:  Discontinued     1,250 mg 166.7 mL/hr over 90 Minutes Intravenous Every 24 hours 05/27/17 1324 05/30/17 1615   05/27/17 1100  vancomycin (VANCOCIN) 1,250 mg in sodium chloride 0.9 % 250 mL IVPB  Status:  Discontinued     1,250 mg 166.7 mL/hr over 90 Minutes Intravenous Every 12 hours 05/27/17 1011 05/27/17 1324   05/20/17 2230  vancomycin (VANCOCIN) 1,250 mg in sodium chloride 0.9 % 250 mL IVPB  Status:  Discontinued     1,250 mg 166.7 mL/hr over 90 Minutes Intravenous Every 24 hours 05/20/17 2151 05/27/17 1011   05/20/17 1400  piperacillin-tazobactam (ZOSYN) IVPB 3.375 g     3.375 g 12.5 mL/hr over 240 Minutes Intravenous Every 8 hours 05/20/17 1044     05/20/17 0500  anidulafungin (ERAXIS) 100 mg in sodium chloride 0.9 % 100 mL IVPB  Status:  Discontinued     100 mg 78 mL/hr over 100 Minutes Intravenous Every 24 hours  05/19/17 0116 05/29/17 1214   05/19/17 2200  vancomycin (VANCOCIN) 1,500 mg in sodium chloride 0.9 % 500 mL IVPB  Status:  Discontinued     1,500 mg 250 mL/hr over 120 Minutes Intravenous Every 24 hours 05/18/17 2126 05/20/17 2150   05/19/17 0130  anidulafungin (ERAXIS) 200 mg in sodium chloride 0.9 % 200 mL IVPB     200 mg 78 mL/hr over 200 Minutes Intravenous  Once 05/19/17 0116 05/19/17 0520   05/19/17 0130  Ampicillin-Sulbactam (UNASYN) 3 g in sodium chloride 0.9 % 100 mL IVPB  Status:  Discontinued     3 g 200 mL/hr over 30 Minutes Intravenous Every 8 hours 05/19/17 0118 05/20/17 1044   05/18/17 2200  vancomycin (VANCOCIN) 1,750 mg in sodium chloride 0.9 % 500 mL IVPB     1,750 mg 250 mL/hr over 120 Minutes Intravenous  Once 05/18/17 2126 05/19/17 0400   05/17/17 0930  piperacillin-tazobactam (ZOSYN) IVPB 3.375 g   Status:  Discontinued     3.375 g 12.5 mL/hr over 240 Minutes Intravenous Every 8 hours 05/17/17 0836 05/19/17 0118   05/11/17 2200  cefoTEtan (CEFOTAN) 2 g in sodium chloride 0.9 % 100 mL IVPB     2 g 200 mL/hr over 30 Minutes Intravenous Every 12 hours 05/11/17 1839 05/11/17 2034   05/11/17 0600  cefoTEtan in Dextrose 5% (CEFOTAN) IVPB 2 g     2 g Intravenous On call to O.R. 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 Active Problems: Generalized abdominal pain Iron deficiency anemia due to chronic blood loss Abnormal CT scan, sigmoid colon Acute blood loss anemia Ventral hernia s/p primary repair 05/11/2017 Physical deconditioning Diabetes mellitus without complication (HCC) CAD (coronary atherosclerotic disease) Septic shock (HCC) Hypoglycemia after GI (gastrointestinal) surgery Colostomy present ([place]) Acute respiratory failure with hypoxemia (HCC) Atelectasis Ascites Intra-abdominal abscess (HCC) Pseudomonas infection Pressure injury of skin Goals of care, counseling/discussion Palliative care by specialist  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 - S/PLow anterior resection with primary colorectal anastomosis, diverting loop ileostomy, rigid proctoscopy, umbilical hernia repair 03/11/5398 Dr. Donnie Le, -Postop anastomotic leak - IR drain 05/26/17 -Wound dehiscence, continueWound VACM/W/F -IR following for RUQ drain.250cc out yesterday -tolerating dysphagia 2 diet really well. -ileostomy output back up to 1600cc yesterday on 8mg  of imodium.  Will increase this to 4mg  TID and see where his output goes.   QQP:YPPJKDTOI 2 with thin liquids per speech ZTI:WPYK DX:IPJAS 05/10>>per ID. 7 more days. Follow up:Dr. Georgette Le  DISPO:continue wound vac Ok for SNF from our standpoint Imodium  to decrease  ostomy output some since he is now on a "solid" diet.    LOS: 74 days    Jesse Le , [facility] 06/20/2017, 9:03 AM Pager: [phone]

## 2017-06-20 NOTE — Progress Notes (Signed)
  Speech Language Pathology Treatment: Dysphagia  Patient Details Name: Jesse Le MRN: [id number] DOB: [birth year] Today's Date: 06/20/2017 Time: 5631-4970 SLP Time Calculation (min) (ACUTE ONLY): 17 min  Assessment / Plan / Recommendation Clinical Impression  Pt was cooperative and redirectable however awareness and frustration is increasing re: prolonged hospitalization and overall situation. SLP attempted to provide encouragement reviewing his recent improvements. Recommending texture upgrade to Dys 3, after cleared with surgical service, as he consumed Dys 3 texture with minimal lingual residue that cleared with liquid bolus. He demonstrates frequent delayed throat clears during most speech sessions as well as during objective eval without penetration/aspiration during MBS. Intake has been little and pt reports due to poor appetite versus the food however upgrade to mech soft (Dys 3), continue thin, upright position (upright position limited due to sacral wound and restrictions with air matress bed). ST to return for only one more session after this upgrade for efficiency.   HPI HPI: 72 yo male presented with abdominal pain, weight loss, intermittent hematochezia and found to have 9 cm colon mass from adenocarcinoma of recto-sigmoid colon.  Underwent low anterior resection with primary anastomosis with diverting loop ileostomy.  Developed hypotension, hypoglycemia 5/07 and altered mental status 5/08 and transferred to ICU with septic shock secondary to intraabdominal source (peritonitis vs wound infection). Required intubation 5/22 due to tachycardua and hypoxia. Self extubated 5/28. PMHx of CAD, DM, TAA, MI, HTN. BSE 05/27/17 rec'd D1, thin. MBS 5/31 with findings of primary oral dysphagia; normal pharyngeal phase.  TF were on hold at that time due to abdominal abcess.  Pt now ready to resume PO diet; new orders received.       SLP Plan  Continue with current plan of care        Recommendations  Diet recommendations: Dysphagia 3 (mechanical soft);Thin liquid Liquids provided via: Cup;Straw Medication Administration: Whole meds with liquid Supervision: Patient able to self feed;Intermittent supervision to cue for compensatory strategies Compensations: Minimize environmental distractions;Slow rate;Small sips/bites Postural Changes and/or Swallow Maneuvers: Seated upright 90 degrees                Oral Care Recommendations: Oral care BID Follow up Recommendations: Skilled Nursing facility;24 hour supervision/assistance SLP Visit Diagnosis: Dysphagia, oral phase (R13.11) Plan: Continue with current plan of care       Kirwin, Aadhira Heffernan Willis 06/20/2017, 12:03 PM   [name] Colvin Caroli.Ed [employer] [phone]

## 2017-06-20 NOTE — Progress Notes (Signed)
Sacral wound dressing change done per orders.

## 2017-06-20 NOTE — Progress Notes (Signed)
Clinical Social Worker following patient for support and discharge needs. CSW spoke with patient daughter Gaylyn Rong via phone. Montina stated she would like patient to be placed at [facility] ([place]) for short term rehab.  CSW reached out to [facility] admission coordinator for [facility]. Tammy stated they are abel to take patient, authorization through insurance has been started.   Rhea Pink, MSW,  [place]

## 2017-06-20 NOTE — Consult Note (Signed)
WOC Nursewound andostomyfollow up Surgical team following for assessment and plan of care for abd wound Ostomy:  Stoma type/location:Ileostomy surgery performed on 5/1 Stoma red and viable, 1 inch, above skin level. Previously noted mucocutaneous separation has resolved; applied barrier ring to assist with protecting site. Output:mod amt brown liquid stool Ostomy pouching:2pc.  Education provided:Pt is in stepdown unit and is lethargic; no family present.He did not watch the pouch change procedure or ask questions.Supplies at bedside for staff nurse use.Ptwill not be able to change or empty pouch without totalassistance after discharge. Enrolled patient in [place] Discharge program:Yes, awhile ago [place] team will continue to follow while in the hospital  Wound:  Reason for Consult:Vac change to abd wound Wound type:Full thickness post-op wound to midline abd, refer to Wed note for measurements.   Wound bed:red and moist with small amt slough to edges Drainage (amount, consistency, odor)mod amt yellow drainage, no odor Dressing procedure/placement/frequency:Applied Mepitel contact layer to protect the wound bed, then one piece white foam, then one piece black foam to 198mm cont suction. Pt tolerated with minimal amt discomfort.  Leftabd with full thickness wound; red and moist, foam dressing applied.  [place] team will change Vac and ostomy pouch Q M/W/F Julien Girt MSN, RN, [place], [place], [place]

## 2017-06-20 NOTE — NC FL2 (Signed)
[place] LEVEL OF CARE SCREENING TOOL     IDENTIFICATION  Patient Name: Jesse Le Birthdate: [birth year] Sex: male Admission Date (Current Location): 05/06/2017  [facility] and [place] Number:  Herbalist and Address:  [facility], [place] [address], [place], [place] [address]      Provider Number: [id number]  Attending Physician Name and Address:  Charolette Forward, MD  Relative Name and Phone Number:  Aram Beecham; granddaughter; [phone]    Current Level of Care: Hospital Recommended Level of Care: [place] Prior Approval Number:    Date Approved/Denied:   PASRR Number: [id number]  Discharge Plan: SNF    Current Diagnoses: Patient Active Problem List   Diagnosis Date Noted  . Goals of care, counseling/discussion   . Palliative care by specialist   . Pressure injury of skin 06/09/2017  . Ascites   . Intra-abdominal abscess ([place])   . Pseudomonas infection   . Acute respiratory failure with hypoxemia ([place])   . Atelectasis   . Septic shock ([place])   . Hypoglycemia after GI (gastrointestinal) surgery   . Colostomy present ([place])   . Ventral hernia s/p primary repair 05/11/2017 05/14/2017  . Physical deconditioning 05/14/2017  . CAD (coronary atherosclerotic disease) 05/14/2017  . Diabetes mellitus without complication ([place])   . Acute blood loss anemia   . 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 Iron deficiency anemia due to chronic blood loss   . Abnormal CT scan, sigmoid colon   . Generalized abdominal pain 05/06/2017  . 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 Aftercare following surgery of the circulatory system, NEC 11/22/2011    Orientation RESPIRATION BLADDER Height & Weight     Self, Time, Place  Normal Incontinent, External catheter Weight: 220 lb (99.8 kg) Height:  5\' 10"  (177.8 cm)  BEHAVIORAL SYMPTOMS/MOOD NEUROLOGICAL BOWEL NUTRITION STATUS      Incontinent,  Ileostomy(placed 05/11/17) Diet(see DC summary)  AMBULATORY STATUS COMMUNICATION OF NEEDS Skin   Extensive Assist Verbally Wound Vac                       Personal Care Assistance Level of Assistance  Bathing, Dressing, Feeding Bathing Assistance: Maximum assistance Feeding assistance: Limited assistance Dressing Assistance: Maximum assistance     Functional Limitations Info  Sight, Hearing, Speech Sight Info: Adequate Hearing Info: Adequate Speech Info: Adequate    SPECIAL CARE FACTORS FREQUENCY  PT (By licensed PT), OT (By licensed OT)     PT Frequency: 5/wk OT Frequency: 5/wk            Contractures Contractures Info: Not present    Additional Factors Info  Code Status, Allergies, Insulin Sliding Scale Code Status Info: FULL Allergies Info: NKA   Insulin Sliding Scale Info: 3/day insulin aspart (novoLOG) injection 0-15 Units        Current Medications (06/20/2017):  This is the current hospital active medication list Current Facility-Administered Medications  Medication Dose Route Frequency Provider Last Rate Last Dose  . 0.9 %  sodium chloride infusion   Intravenous Continuous Charolette Forward, MD 10 mL/hr at 06/20/17 1003    . acetaminophen (TYLENOL) solution 650 mg  650 mg Oral Q8H PRN Greer Pickerel, MD   650 mg at 06/18/17 0525  . chlorhexidine (PERIDEX) 0.12 % solution 15 mL  15 mL Mouth Rinse BID Charolette Forward, MD   15 mL at 06/20/17 0944  . collagenase (SANTYL) ointment  Topical Daily Charolette Forward, MD      . dextrose 5 % and 0.45 % NaCl with KCl 10 mEq/L infusion   Intravenous Continuous Charolette Forward, MD 50 mL/hr at 06/20/17 1003 50 mL/hr at 06/20/17 1003  . famotidine (PEPCID) tablet 40 mg  40 mg Oral Daily Sloan Leiter B, RPH   40 mg at 06/20/17 0944  . feeding supplement (ENSURE ENLIVE) (ENSURE ENLIVE) liquid 237 mL  237 mL Oral BID BM Focht, Jessica L, PA   237 mL at 06/20/17 0947  . feeding supplement (PRO-STAT SUGAR FREE 64) liquid 30 mL  30  mL Oral BID Sloan Leiter B, RPH   30 mL at 06/20/17 0947  . Gerhardt's butt cream   Topical PRN Charolette Forward, MD      . hydrocortisone (ANUSOL-HC) 2.5 % rectal cream 1 application  1 application Topical QID PRN Michael Boston, MD      . hydrocortisone cream 1 % 1 application  1 application Topical TID PRN Michael Boston, MD      . insulin aspart (novoLOG) injection 0-15 Units  0-15 Units Subcutaneous TID AC & HS Charolette Forward, MD   2 Units at 06/19/17 1315  . lip balm (CARMEX) ointment 1 application  1 application Topical BID Michael Boston, MD   1 application at 89/38/10 0946  . loperamide (IMODIUM) capsule 4 mg  4 mg Oral TID Saverio Danker, PA-C   4 mg at 06/20/17 0945  . MEDLINE mouth rinse  15 mL Mouth Rinse q12n4p Charolette Forward, MD   15 mL at 06/19/17 1600  . metoprolol tartrate (LOPRESSOR) tablet 25 mg  25 mg Oral BID Charolette Forward, MD   25 mg at 06/20/17 0945  . ondansetron (ZOFRAN) injection 4 mg  4 mg Intravenous Q6H PRN Donnie Mesa, MD   4 mg at 06/04/17 1938  . piperacillin-tazobactam (ZOSYN) IVPB 3.375 g  3.375 g Intravenous Q8H Charolette Forward, MD 12.5 mL/hr at 06/20/17 0944 3.375 g at 06/20/17 0944  . prochlorperazine (COMPAZINE) injection 5-10 mg  5-10 mg Intravenous Q4H PRN Michael Boston, MD      . sodium chloride flush (NS) 0.9 % injection 10-40 mL  10-40 mL Intracatheter Q12H Charolette Forward, MD   10 mL at 06/20/17 1004  . sodium chloride flush (NS) 0.9 % injection 10-40 mL  10-40 mL Intracatheter PRN Charolette Forward, MD      . sodium chloride flush (NS) 0.9 % injection 5 mL  5 mL Intracatheter Q8H Sandi Mariscal, MD   5 mL at 06/20/17 0630  . vitamin C (ASCORBIC ACID) tablet 500 mg  500 mg Oral Daily Priscella Mann, RPH   500 mg at 06/20/17 1751     Discharge Medications: Please see discharge summary for a list of discharge medications.  Relevant Imaging Results:  Relevant Lab Results:   Additional Information SS#: [id number] will go out with iv antibiotic    Wende Neighbors, LCSW

## 2017-06-20 NOTE — Care Management Important Message (Signed)
Important Message  Patient Details  Name: Jesse Le MRN: [id number] Date of Birth: 18-Feb-1945   Medicare Important Message Given:  Yes    Barb Merino Rielly Corlett 06/20/2017, 3:32 PM

## 2017-06-21 DIAGNOSIS — N390 Urinary tract infection, site not specified: Secondary | ICD-10-CM | POA: Diagnosis not present

## 2017-06-21 DIAGNOSIS — Z79899 Other long term (current) drug therapy: Secondary | ICD-10-CM | POA: Diagnosis not present

## 2017-06-21 DIAGNOSIS — R5381 Other malaise: Secondary | ICD-10-CM | POA: Diagnosis not present

## 2017-06-21 DIAGNOSIS — E785 Hyperlipidemia, unspecified: Secondary | ICD-10-CM | POA: Diagnosis not present

## 2017-06-21 DIAGNOSIS — R1310 Dysphagia, unspecified: Secondary | ICD-10-CM | POA: Diagnosis not present

## 2017-06-21 DIAGNOSIS — Z85048 Personal history of other malignant neoplasm of rectum, rectosigmoid junction, and anus: Secondary | ICD-10-CM | POA: Diagnosis not present

## 2017-06-21 DIAGNOSIS — C189 Malignant neoplasm of colon, unspecified: Secondary | ICD-10-CM | POA: Diagnosis not present

## 2017-06-21 DIAGNOSIS — R918 Other nonspecific abnormal finding of lung field: Secondary | ICD-10-CM | POA: Diagnosis not present

## 2017-06-21 DIAGNOSIS — C187 Malignant neoplasm of sigmoid colon: Secondary | ICD-10-CM | POA: Diagnosis not present

## 2017-06-21 DIAGNOSIS — A419 Sepsis, unspecified organism: Secondary | ICD-10-CM | POA: Diagnosis not present

## 2017-06-21 DIAGNOSIS — E039 Hypothyroidism, unspecified: Secondary | ICD-10-CM | POA: Diagnosis not present

## 2017-06-21 DIAGNOSIS — K651 Peritoneal abscess: Secondary | ICD-10-CM | POA: Diagnosis not present

## 2017-06-21 DIAGNOSIS — Z7401 Bed confinement status: Secondary | ICD-10-CM | POA: Diagnosis not present

## 2017-06-21 DIAGNOSIS — E119 Type 2 diabetes mellitus without complications: Secondary | ICD-10-CM | POA: Diagnosis not present

## 2017-06-21 DIAGNOSIS — R404 Transient alteration of awareness: Secondary | ICD-10-CM | POA: Diagnosis not present

## 2017-06-21 DIAGNOSIS — C19 Malignant neoplasm of rectosigmoid junction: Secondary | ICD-10-CM | POA: Diagnosis not present

## 2017-06-21 DIAGNOSIS — R262 Difficulty in walking, not elsewhere classified: Secondary | ICD-10-CM | POA: Diagnosis not present

## 2017-06-21 DIAGNOSIS — M6281 Muscle weakness (generalized): Secondary | ICD-10-CM | POA: Diagnosis not present

## 2017-06-21 DIAGNOSIS — R6521 Severe sepsis with septic shock: Secondary | ICD-10-CM | POA: Diagnosis not present

## 2017-06-21 DIAGNOSIS — K219 Gastro-esophageal reflux disease without esophagitis: Secondary | ICD-10-CM | POA: Diagnosis not present

## 2017-06-21 DIAGNOSIS — A498 Other bacterial infections of unspecified site: Secondary | ICD-10-CM | POA: Diagnosis not present

## 2017-06-21 DIAGNOSIS — Z4801 Encounter for change or removal of surgical wound dressing: Secondary | ICD-10-CM | POA: Diagnosis not present

## 2017-06-21 DIAGNOSIS — N189 Chronic kidney disease, unspecified: Secondary | ICD-10-CM | POA: Diagnosis not present

## 2017-06-21 DIAGNOSIS — L89153 Pressure ulcer of sacral region, stage 3: Secondary | ICD-10-CM | POA: Diagnosis not present

## 2017-06-21 DIAGNOSIS — E46 Unspecified protein-calorie malnutrition: Secondary | ICD-10-CM | POA: Diagnosis not present

## 2017-06-21 DIAGNOSIS — D649 Anemia, unspecified: Secondary | ICD-10-CM | POA: Diagnosis not present

## 2017-06-21 DIAGNOSIS — K8020 Calculus of gallbladder without cholecystitis without obstruction: Secondary | ICD-10-CM | POA: Diagnosis not present

## 2017-06-21 DIAGNOSIS — M2550 Pain in unspecified joint: Secondary | ICD-10-CM | POA: Diagnosis not present

## 2017-06-21 DIAGNOSIS — D500 Iron deficiency anemia secondary to blood loss (chronic): Secondary | ICD-10-CM | POA: Diagnosis not present

## 2017-06-21 DIAGNOSIS — R531 Weakness: Secondary | ICD-10-CM | POA: Diagnosis not present

## 2017-06-21 DIAGNOSIS — I480 Paroxysmal atrial fibrillation: Secondary | ICD-10-CM | POA: Diagnosis not present

## 2017-06-21 DIAGNOSIS — J690 Pneumonitis due to inhalation of food and vomit: Secondary | ICD-10-CM | POA: Diagnosis not present

## 2017-06-21 DIAGNOSIS — J449 Chronic obstructive pulmonary disease, unspecified: Secondary | ICD-10-CM | POA: Diagnosis not present

## 2017-06-21 DIAGNOSIS — R319 Hematuria, unspecified: Secondary | ICD-10-CM | POA: Diagnosis not present

## 2017-06-21 DIAGNOSIS — J9601 Acute respiratory failure with hypoxia: Secondary | ICD-10-CM | POA: Diagnosis not present

## 2017-06-21 DIAGNOSIS — I10 Essential (primary) hypertension: Secondary | ICD-10-CM | POA: Diagnosis not present

## 2017-06-21 LAB — GLUCOSE, CAPILLARY
GLUCOSE-CAPILLARY: 118 mg/dL — AB (ref 65–99)
Glucose-Capillary: 122 mg/dL — ABNORMAL HIGH (ref 65–99)

## 2017-06-21 MED ORDER — FAMOTIDINE 40 MG PO TABS: 40.0000 mg | ORAL_TABLET | Freq: Every day | ORAL | 3 refills | Status: AC

## 2017-06-21 MED ORDER — ENSURE ENLIVE PO LIQD
237.0000 mL | Freq: Two times a day (BID) | ORAL | 12 refills | Status: AC
Start: 2017-06-21 — End: ?

## 2017-06-21 MED ORDER — PRO-STAT SUGAR FREE PO LIQD: 30.0000 mL | Freq: Three times a day (TID) | ORAL | 0 refills | Status: AC

## 2017-06-21 MED ORDER — METOPROLOL TARTRATE 25 MG PO TABS: 25.0000 mg | ORAL_TABLET | Freq: Two times a day (BID) | ORAL | 3 refills | Status: AC

## 2017-06-21 MED ORDER — LOPERAMIDE HCL 2 MG PO CAPS: 4.0000 mg | ORAL_CAPSULE | Freq: Three times a day (TID) | ORAL | 0 refills | Status: AC

## 2017-06-21 MED ORDER — HEPARIN SOD (PORK) LOCK FLUSH 100 UNIT/ML IV SOLN
250.0000 [IU] | INTRAVENOUS | Status: AC | PRN
  Administered 2017-06-21 (×2): 250 [IU]

## 2017-06-21 MED ORDER — PIPERACILLIN-TAZOBACTAM 3.375 G IVPB: 3.3750 g | Freq: Three times a day (TID) | INTRAVENOUS | 7 refills | Status: AC

## 2017-06-21 NOTE — Discharge Summary (Signed)
NAMELAVANCE, BEAZER MEDICAL RECORD OB:[id number] ACCOUNT [id number] DATE OF BIRTH:[birth year] FACILITY: [facility] LOCATION: [facility] PHYSICIAN:Raechelle Sarti Daivd Council, MD  DISCHARGE SUMMARY  DATE OF DISCHARGE:  07/04/2017  The patient will be discharged to skilled nursing facility, [facility].  ADMITTING DIAGNOSES: 1.  Generalized abdominal pain associated with marked hypochromic microcytic anemia with questionable sigmoid tumor; rule out colonic carcinoma. 2.  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 3.  Hypertension. 4.  Diabetes mellitus. 5.  Chronic obstructive pulmonary disease   6.  Morbid obesity. 7.  Chronic kidney disease stage II.  DISCHARGE DIAGNOSES:   1. Rectosigmoid adenocarcinoma of the colon, status post resection with primary colorectal anastomosis and diverting loop ileostomy and umbilical hernia repair. 2. Abdominal surgical wound infection. 3. Intra-abdominal abscess with persistent anastomotic leak and drainage from the Jackson-Pratt catheter.  Status post retroperitoneal hematoma, which is stable. 4. Status post acute respiratory failure. 5. Status post aspiration pneumonia. 6. Status post septic shock. 7. Status post acute renal failure. 8. Status post paroxysmal atrial fibrillation/nonsustained supraventricular tachycardia, converted back into sinus rhythm, stable. 9. Acute on chronic hypochromic microcytic anemia, stable. 10. History of thoracic aneurysm dissection,  status post endograft and infrarenal dissection status post fenestrated endovascular graft, stable.   11. Hypertension.   12. Diabetes mellitus.   13. Chronic obstructive pulmonary disease 14. Morbid obesity. 15.  Deconditioning. 16.  Protein calorie malnutrition.  DISCHARGE HOME MEDICATIONS:   1.  Aspirin 81 mg 1 tablet daily. 2.  Metoprolol 25 mg twice daily. 3.  Zosyn 3.375 grams q.8 hours,  4.   Pepcid 40 mg daily. 5.  Imodium 4 mg 3 times daily as needed. 6.  Ensure to 237 mL 2 times daily as tolerated.   7.  Tylenol 650 mg q.8 hours  p.r.n.  DIET:  Advance diet as tolerated.  CONDITION AT DISCHARGE:  Stable.  FOLLOWUP:  With me in 2 weeks.  Follow up with Surgery and Infectious Disease in 1 to 2 weeks.  The patient will be also followed at [facility].    DISCHARGE INSTRUCTIONS:  The patient also will need a dressing change and wound VAC dressing change as per Surgery and also will need drainage catheter care as per Surgery.  CONDITION AT DISCHARGE:  Stable.  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  He came to the ER  complaining of vague generalized abdominal pain associated with poor appetite and occasional blood in the stool for the last 3-4 weeks.  The patient is very vague in his history.  Denies any NSAID abuse.  States he has been taking 1 aspirin every day.   The patient was noted to have hypochromic microcytic anemia with hemoglobin of 5.6.  CT angio in the ED showed no evidence of endovascular dissection, but showed a  9 x 5 cm soft tissue opacity concerning for colonic carcinoma.  The patient denies any fever or chills.  Denies any GI workup in the past.  Denies any colonoscopy in the past.  The patient receiving packed RBCs in the ED.  GI consultation has been called  by the ED physician.  PHYSICAL EXAMINATION: GENERAL:  Alert, awake, oriented. VITAL SIGNS:  Blood pressure was 157/63, pulse was 86.  His temperature was 99.9. HEENT:   Conjunctivae pale. NECK:  Supple, no JVD, no bruit. LUNGS:  Clear to auscultation without rhonchi or rales. CARDIOVASCULAR:  S1, S2 was normal.  There was soft systolic murmur.  No S3 gallop. ABDOMEN:  Soft.  Bowel sounds are present.  There was mild generalized tenderness noted.  There was an umbilical hernia noted.  There was no guarding or rebound. EXTREMITIES:  There is no clubbing, cyanosis or edema.   NEUROLOGIC:  Grossly intact.  LABORATORY DATA:  His admission labs, sodium was 140, potassium 2.7, BUN 5, creatinine 1.17, glucose 110, hemoglobin was 5.8, hematocrit 22.8, white count of 9.8, MCV was low 69.9.  His hemoglobin A1c was 4.9.  Fecal occult blood was positive.    CT showed 9 x 5 cm eccentric soft tissue abnormalities seen involving the sigmoid colon concerning for neoplasm, although diverticulitis could not be excluded.  There was 4.1 cm ascending thoracic aneurysm.  Stent graft in the thoracic arch extending  down to the descending thoracic aorta into the abdominal aorta was okay.  There was no evidence of endovascular leak.  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  There was no evidence of  dissection.    EKG done in the ED showed normal sinus rhythm with nonspecific ST-T wave changes.  His CEA level was elevated to 24.6.  Echocardiogram done on 89/38/1017 showed LV systolic function was normal, EF 50-55%.  There was mild mitral regurgitation and trivial tricuspid regurgitation.  There was trivial pericardial effusion.  No evidence of cardiac tamponade.    CT of the abdomen and pelvis done on 05/19/2017 showed visible 6 mm defect in the posterior colon wall just above the colocolonic anastomosis with phlegmon material tracking superiorly and anteriorly.  The largest gas and feculent collection measuring  about 3 cm.  The sigmoid colon surgical drain was placed.  Likely fluid collection in the right lower quadrant with  mesenteric fat edema.  Moderate pneumoperitoneum.  Probable mild ileus.  Open lower abdominal wall with packing.    Repeat CT done on 06/02/2017 showed progressive multifocal loculated fluid and gas collection throughout much of the peritoneal space compared to 05/25/2017 despite the well-positioned right perihepatic percutaneous drainage catheter suggest taking  persistent anastomotic leak and possible peritoneal abscess.  There was also noted to have a right psoas hematoma.    Repeat CT on 06/10/2017 showed stable position of the right perihepatic drainage catheter with significant decrease in size of superior and perihepatic portion of the fluid collection.  The more inferior aspect of it is extending into the right lower  quadrant, which is not changed.  Right iliopsoas hematoma is also not significantly changed.  There was mild right pleural effusion and subsegmental atelectasis.  Last chest x-ray done on 06/15/2017 showed stable right upper lobe opacity concerning for  possible pneumonia, mild bibasilar subsegmental atelectasis.  CONSULTANTS: 1.  GI.   2.  CCM. 3.  Infectious disease. 4.  Oncology consult.  BRIEF HOSPITAL COURSE:  The patient was initially admitted to telemetry floor.  GI consultation was obtained.  The patient underwent a colonoscopy and biopsy as per procedure report, which was noted to have rectosigmoid adenocarcinoma.  Surgical  consultation was obtained with [facility].  The patient subsequently underwent a low anterior  resection of rectosigmoid adenocarcinoma with primary colorectal anastomosis and diverting loop ileostomy and umbilical hernia repair.  The  patient tolerated the procedure well.  Postoperatively, on day 2, the patient was started on clear liquids and was tolerating it well.  Oncology consultation was also obtained.  The patient was supposed to follow up with oncology as outpatient for  consideration for possible chemo and radiation  therapy.  On postop day #3, the patient had gaping of lower abdominal wound with drainage.  Local dressings were done without significant improvement in the wound and followed by complete gaping of the wound  and possible affecting the fascia.  Cultures were obtained, which were positive for Pseudomonas.  The patient postoperatively had very high output from ileostomy and on 05/17/2017 went into hypotensive shock with acute renal failure and had multiple  episodes of hypoglycemia requiring multiple D50 and was started on D10 IV fluid and was transferred to CCU.  Pan cultures were also obtained.  The patient was switched to vancomycin and Zosyn with improvement in his symptoms, but continued to have a  large amount of drainage through the ileostomy and JP catheter.  The patient in CCU pulled out the JP catheter.  The patient subsequently had placement of drainage catheter into the right upper quadrant perihepatic area where there was the maximum  collection and cultures were positive for Pseudomonas.  The patient has been maintained on Zosyn for the last more than 1 month.  The patient had another episode of hypoxia and hypotension while in CCU requiring intubation and was noted to have multiple  infiltrates in the right lung field, subsequently requiring bronchoscopy and suctioning of the right lower and middle lobe.  ID consultation was also obtained and vancomycin was discontinued and has been maintained on Zosyn.  The patient on 06/08/2017  self-extubated, which he has it tolerated well.  His sats have remained in 96-97% on room air.  Initially, the tube feeding was started, which he was tolerating it well, but pulled out his NG tube feeding.  Swallowing evaluation was done.  The patient  was felt to be an appropriate candidate for a soft diet and clear liquids.  His diet has been slowly advanced.  His white count has gradually trended down to its baseline.  The patient has remained afebrile for the last  few days.  His diet has improved  slowly during the hospital stay.  The patient while in the CCU was also started on TPN, which was discontinued.  The patient was transferred to the floor.  The patient is up in the chair, alert, awake and has remained afebrile for the last few days.  As  above, the patient has a wound VAC placed.  His abdominal wound is healing well and also has ileostomy which is functioning normally.  The patient had a drainage catheter in the right upper quadrant in the perihepatic area with JP bulb, which is draining  purulent material.  The patient will be discharged to skilled nursing facility on IV antibiotics and will be reassessed in a few days if his drainage has resolved and repeat CT shows no evidence of abscess.  A JP catheter will be pulled out as per  therapy recommendation and antibiotics will be stopped.  If he continues to have drainage through the JP catheter, patient will be continued on IV Zosyn as per ID recommendations.    The patient will be followed up by infectious disease and surgery as outpatient in the next  1-2 weeks and will be followed up in my office in 2-3 weeks as appropriate.  AN/NUANCE D:06/28/2017 T:06/12/2017 JOB:[id number]

## 2017-06-21 NOTE — Care Management Note (Signed)
Case Management Note Marvetta Gibbons RN,BSN Unit [facility] 1-22 Case Manager  [id number]  Patient Details  Name: Jesse Le MRN: [id number] Date of Birth: February 05, 1945  Subjective/Objective:  Pt admitted with sigmoid cancer s/p Colon Resection Sigmoid Low Anterior Resection with Staple Colon Relast HERNIA REPAIR UMBILICAL ADULT and Creation Vertical Loop ILEOSTOMY               Action/Plan: PTA pt lived at home with SO- per PT eval recommendation for SNF- CSW to follow for placement needs.   Expected Discharge Date:  07/10/2017               Expected Discharge Plan:  Skilled Nursing Facility  In-House Referral:  Clinical Social Work  Discharge planning Services  CM Consult  Post Acute Care Choice:    Choice offered to:     DME Arranged:    DME Agency:     HH Arranged:    [place] Agency:     Status of Service:  Completed, signed off  If discussed at [employer], dates discussed:    Discharge Disposition: skilled facility   Additional Comments:  06/17/2017- 1500- Balthazar Dooly RN, CM- pt has transition to dys. 2 diet cortrak has been removed- wound VAC remains, still has IR drain. Will need IV abx till next week 6/17. CSW following for SNF placement- and pt has bed available for transition to SNF today.   06/17/17- 1200- Maylie Ashton RN, CM- pt has passed swallow test and started on diet- cortrak removed- CSW following for SNF placement.   Dawayne Patricia, RN 06/12/2017, 3:22 PM

## 2017-06-21 NOTE — Progress Notes (Signed)
07/10/2017 7:48 PM Report called to [employer]. [place]

## 2017-06-21 NOTE — Discharge Instructions (Signed)
°  Ascites Drainage Catheter Placement, Care After Refer to this sheet in the next few weeks. These instructions provide you with information about caring for yourself after your procedure. Your health care provider may also give you more specific instructions. Your treatment has been planned according to current medical practices, but problems sometimes occur. Call your health care provider if you have any problems or questions after your procedure. What can I expect after the procedure? After the procedure, it is common to have:  Pain, soreness, and bruising near the area where the catheter was placed.  Drowsiness. This may last for several hours.  Trouble remembering things. The medicine that was given to help you relax during the procedure (sedative) may take several hours to fully wear off.  Follow these instructions at home:  Rest, but do not spend the whole day in bed. Walk around to stay active.  Weigh yourself every day. A change in weight may mean that too much fluid is building up in your body. Ask how much change in your weight would give you a reason to call or visit your health care provider.  Take medicines only as directed by your health care provider.  Keep all follow-up visits as directed by your health care provider. This is important.  If you are asked to drain fluid at home, follow instructions from your health care provider about how to do this.  Follow any dietary instructions from your health care provider. You may need to: ? Limit foods and liquids that increase or decrease how much fluid your body holds onto. ? Limit how much salt (sodium) you eat. ? Eat a high-protein diet. ? Avoid alcohol.  There are many different ways to close and cover an incision, including stitches (sutures), skin glue, and adhesive strips. Follow your health care providers instructions about: ? Incision care. ? Bandage (dressing) changes and removal. ? Incision closure  removal.  Check your incision area every day for signs of infection. Watch for: ? Redness, swelling, or pain. ? Fluid, blood, or pus. Contact a health care provider if:  You have skin irritation, redness, or pain where the catheter was inserted (insertion site).  You have trouble draining fluid.  You have trouble caring for your catheter.  You have abdominal pain, bloating, or cramping. Get help right away if:  You develop a fever or chills.  You have increasing redness or increasing pain around the insertion site.  You have blood or pus coming from the insertion site.  Fluid that drains out of the catheter becomes cloudy or has a bad smell.  You have abdominal pain or cramping that worsens or does not go away. This information is not intended to replace advice given to you by your health care provider. Make sure you discuss any questions you have with your health care provider. Document Released: 12/17/2010 Document Revised: 09/04/2015 Document Reviewed: 12/24/2013 Elsevier Interactive Patient Education  2018 [place] 12mg /day for high output from ileostomy, may increase to 16mg /day if output still elevated. -wound VAC changes Monday, Wednesday, Friday -flush IR drain with 5cc of NS daily, record output and take to Radiology follow up appointment

## 2017-06-21 NOTE — Discharge Summary (Signed)
Discharge summary dictated on 06/12/2017 dictation number is [phone]

## 2017-06-21 NOTE — Progress Notes (Signed)
Pt to transport to SNF via [facility]. Vital signs stable. Family aware and report given to facility by previous (day shift) RN. Wound vac removed and new dressing applied to abdomin. Pt belongings in bag and transported with pt via bed. [facility] updated on pt. Pt has no questions at this time.

## 2017-06-21 NOTE — Progress Notes (Signed)
JP drain was flushed and emptied. See flow sheets for time and amount.

## 2017-06-21 NOTE — Plan of Care (Signed)
  Problem: Clinical Measurements: Goal: Respiratory complications will improve Outcome: Not Progressing   Problem: Skin Integrity: Goal: Risk for impaired skin integrity will decrease Outcome: Not Progressing

## 2017-06-21 NOTE — Progress Notes (Signed)
Clinical Social Worker facilitated patient discharge including contacting patient family and facility to confirm patient discharge plans.  Clinical information faxed to facility and family agreeable with plan.  CSW arranged ambulance transport via [facility] to [place] .  RN to call [phone] (will go in room 107) for report prior to discharge.  Clinical Social Worker will sign off for now as social work intervention is no longer needed. Please consult [place] again if new need arises.  Rhea Pink, MSW, [facility]

## 2017-06-21 NOTE — Progress Notes (Signed)
Patient ID: Jesse Le, male   DOB: 1945-07-18, 72 y.o.   MRN: [id number]    41 Days Post-Op  Subjective: Patient seems more appropriate today.  Didn't eat much for breakfast but has no complaints.  Denies abdominal pain  Objective: Vital signs in last 24 hours: Temp:  [98.2 F (36.8 C)-98.8 F (37.1 C)] 98.2 F (36.8 C) (06/11 0346) Pulse Rate:  [70-119] 70 (06/11 0346) Resp:  [18-20] 20 (06/11 0346) BP: (95-154)/(49-103) 116/67 (06/11 0346) SpO2:  [94 %-98 %] 97 % (06/11 0346) Last BM Date: 06/20/17  Intake/Output from previous day: 06/10 0701 - 06/11 0700 In: 1818.3 [P.O.:480; I.V.:1238.3; IV Piggyback:50] Out: 2675 [Urine:1550; Drains:75; Stool:1050] Intake/Output this shift: Total I/O In: 402 [P.O.:402] Out: 0   PE: Abd: soft, NT, ND, wound with VAC in place, ileostomy with 1L out yesterday.  RUQ drain with seropurulent drainage still.  Lab Results:  Recent Labs    06/20/17 0500  WBC 9.0  HGB 8.5*  HCT 28.5*  PLT 397   BMET Recent Labs    06/20/17 0500  NA 140  K 4.0  CL 111  CO2 22  GLUCOSE 79  BUN 11  CREATININE 1.25*  CALCIUM 8.1*   PT/INR No results for input(s): LABPROT, INR in the last 72 hours. CMP     Component Value Date/Time   NA 140 06/20/2017 0500   K 4.0 06/20/2017 0500   CL 111 06/20/2017 0500   CO2 22 06/20/2017 0500   GLUCOSE 79 06/20/2017 0500   BUN 11 06/20/2017 0500   CREATININE 1.25 (H) 06/20/2017 0500   CALCIUM 8.1 (L) 06/20/2017 0500   PROT 6.3 (L) 06/20/2017 0500   ALBUMIN 1.7 (L) 06/20/2017 0500   AST 54 (H) 06/20/2017 0500   ALT 27 06/20/2017 0500   ALKPHOS 194 (H) 06/20/2017 0500   BILITOT 1.1 06/20/2017 0500   GFRNONAA 56 (L) 06/20/2017 0500   GFRAA >60 06/20/2017 0500   Lipase     Component Value Date/Time   LIPASE 22 05/06/2017 1036       Studies/Results: No results found.  Anti-infectives: Anti-infectives (From admission, onward)   Start     Dose/Rate Route Frequency Ordered Stop   05/28/17  1200  vancomycin (VANCOCIN) 1,250 mg in sodium chloride 0.9 % 250 mL IVPB  Status:  Discontinued     1,250 mg 166.7 mL/hr over 90 Minutes Intravenous Every 24 hours 05/27/17 1324 05/30/17 1615   05/27/17 1100  vancomycin (VANCOCIN) 1,250 mg in sodium chloride 0.9 % 250 mL IVPB  Status:  Discontinued     1,250 mg 166.7 mL/hr over 90 Minutes Intravenous Every 12 hours 05/27/17 1011 05/27/17 1324   05/20/17 2230  vancomycin (VANCOCIN) 1,250 mg in sodium chloride 0.9 % 250 mL IVPB  Status:  Discontinued     1,250 mg 166.7 mL/hr over 90 Minutes Intravenous Every 24 hours 05/20/17 2151 05/27/17 1011   05/20/17 1400  piperacillin-tazobactam (ZOSYN) IVPB 3.375 g     3.375 g 12.5 mL/hr over 240 Minutes Intravenous Every 8 hours 05/20/17 1044     05/20/17 0500  anidulafungin (ERAXIS) 100 mg in sodium chloride 0.9 % 100 mL IVPB  Status:  Discontinued     100 mg 78 mL/hr over 100 Minutes Intravenous Every 24 hours 05/19/17 0116 05/29/17 1214   05/19/17 2200  vancomycin (VANCOCIN) 1,500 mg in sodium chloride 0.9 % 500 mL IVPB  Status:  Discontinued     1,500 mg 250 mL/hr over 120 Minutes Intravenous  Every 24 hours 05/18/17 2126 05/20/17 2150   05/19/17 0130  anidulafungin (ERAXIS) 200 mg in sodium chloride 0.9 % 200 mL IVPB     200 mg 78 mL/hr over 200 Minutes Intravenous  Once 05/19/17 0116 05/19/17 0520   05/19/17 0130  Ampicillin-Sulbactam (UNASYN) 3 g in sodium chloride 0.9 % 100 mL IVPB  Status:  Discontinued     3 g 200 mL/hr over 30 Minutes Intravenous Every 8 hours 05/19/17 0118 05/20/17 1044   05/18/17 2200  vancomycin (VANCOCIN) 1,750 mg in sodium chloride 0.9 % 500 mL IVPB     1,750 mg 250 mL/hr over 120 Minutes Intravenous  Once 05/18/17 2126 05/19/17 0400   05/17/17 0930  piperacillin-tazobactam (ZOSYN) IVPB 3.375 g  Status:  Discontinued     3.375 g 12.5 mL/hr over 240 Minutes Intravenous Every 8 hours 05/17/17 0836 05/19/17 0118   05/11/17 2200  cefoTEtan (CEFOTAN) 2 g in sodium  chloride 0.9 % 100 mL IVPB     2 g 200 mL/hr over 30 Minutes Intravenous Every 12 hours 05/11/17 1839 05/11/17 2034   05/11/17 0600  cefoTEtan in Dextrose 5% (CEFOTAN) IVPB 2 g     2 g Intravenous On call to O.R. 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 Active Problems: Generalized abdominal pain Iron deficiency anemia due to chronic blood loss Abnormal CT scan, sigmoid colon Acute blood loss anemia Ventral hernia s/p primary repair 05/11/2017 Physical deconditioning Diabetes mellitus without complication (HCC) CAD (coronary atherosclerotic disease) Septic shock (HCC) Hypoglycemia after GI (gastrointestinal) surgery Colostomy present ([place]) Acute respiratory failure with hypoxemia (HCC) Atelectasis Ascites Intra-abdominal abscess (HCC) Pseudomonas infection Pressure injury of skin Goals of care, counseling/discussion Palliative care by specialist  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 - S/PLow anterior resection with primary colorectal anastomosis, diverting loop ileostomy, rigid proctoscopy, umbilical hernia repair 02/17/8339 Dr. Donnie Mesa, -Postop anastomotic leak - IR drain 05/26/17, still in place.  Follow up per IR, 75 cc yesterday -Wound dehiscence, continueWound VACM/W/F -tolerating dysphagia 3 diet. -ileostomy output down to 1L yesterday with increase in imodium to TID.  Will follow and see how this does at this dose.  If it remains high, then imodium can be maxed out at 16mg  a day.  DQQ:[id number] 3 JHE:RDEY CX:KGYJE 05/10>>per ID.29more days. Follow up:Dr. Georgette Dover  DISPO:continue wound vac Ok for SNF from our standpoint Imodium to decrease ostomy output some since he is now on a "solid" diet.     LOS: 46 days    [name] , [facility] Surgery 06/24/2017, 8:41  AM Pager: [phone]

## 2017-06-22 ENCOUNTER — Other Ambulatory Visit: Payer: Self-pay | Admitting: Surgery

## 2017-06-22 DIAGNOSIS — K651 Peritoneal abscess: Secondary | ICD-10-CM

## 2017-06-22 DIAGNOSIS — R5381 Other malaise: Secondary | ICD-10-CM | POA: Diagnosis not present

## 2017-06-22 DIAGNOSIS — C19 Malignant neoplasm of rectosigmoid junction: Secondary | ICD-10-CM | POA: Diagnosis not present

## 2017-06-22 DIAGNOSIS — I10 Essential (primary) hypertension: Secondary | ICD-10-CM | POA: Diagnosis not present

## 2017-06-22 DIAGNOSIS — A498 Other bacterial infections of unspecified site: Secondary | ICD-10-CM | POA: Diagnosis not present

## 2017-06-23 ENCOUNTER — Inpatient Hospital Stay: Payer: Medicare Other | Admitting: Internal Medicine

## 2017-06-23 DIAGNOSIS — R5381 Other malaise: Secondary | ICD-10-CM | POA: Diagnosis not present

## 2017-06-23 DIAGNOSIS — J449 Chronic obstructive pulmonary disease, unspecified: Secondary | ICD-10-CM | POA: Diagnosis not present

## 2017-06-23 DIAGNOSIS — I10 Essential (primary) hypertension: Secondary | ICD-10-CM | POA: Diagnosis not present

## 2017-06-23 DIAGNOSIS — E119 Type 2 diabetes mellitus without complications: Secondary | ICD-10-CM | POA: Diagnosis not present

## 2017-06-27 DIAGNOSIS — I10 Essential (primary) hypertension: Secondary | ICD-10-CM | POA: Diagnosis not present

## 2017-06-27 DIAGNOSIS — C19 Malignant neoplasm of rectosigmoid junction: Secondary | ICD-10-CM | POA: Diagnosis not present

## 2017-06-27 DIAGNOSIS — E119 Type 2 diabetes mellitus without complications: Secondary | ICD-10-CM | POA: Diagnosis not present

## 2017-06-27 DIAGNOSIS — D500 Iron deficiency anemia secondary to blood loss (chronic): Secondary | ICD-10-CM | POA: Diagnosis not present

## 2017-06-28 DIAGNOSIS — L89153 Pressure ulcer of sacral region, stage 3: Secondary | ICD-10-CM | POA: Diagnosis not present

## 2017-06-28 DIAGNOSIS — I480 Paroxysmal atrial fibrillation: Secondary | ICD-10-CM | POA: Diagnosis not present

## 2017-07-05 ENCOUNTER — Ambulatory Visit
Admission: RE | Admit: 2017-07-05 | Discharge: 2017-07-05 | Disposition: A | Discharge status: Home / Self Care | Payer: Medicare Other | Source: Ambulatory Visit | Attending: Student | Admitting: Student

## 2017-07-05 ENCOUNTER — Ambulatory Visit
Admission: RE | Admit: 2017-07-05 | Discharge: 2017-07-05 | Disposition: A | Discharge status: Home / Self Care | Payer: Medicare Other | Source: Ambulatory Visit | Attending: Surgery | Admitting: Surgery

## 2017-07-05 DIAGNOSIS — L89153 Pressure ulcer of sacral region, stage 3: Secondary | ICD-10-CM | POA: Diagnosis not present

## 2017-07-05 DIAGNOSIS — Z85048 Personal history of other malignant neoplasm of rectum, rectosigmoid junction, and anus: Secondary | ICD-10-CM | POA: Diagnosis not present

## 2017-07-05 DIAGNOSIS — Z4801 Encounter for change or removal of surgical wound dressing: Secondary | ICD-10-CM | POA: Diagnosis not present

## 2017-07-05 DIAGNOSIS — K8020 Calculus of gallbladder without cholecystitis without obstruction: Secondary | ICD-10-CM | POA: Diagnosis not present

## 2017-07-05 DIAGNOSIS — K651 Peritoneal abscess: Secondary | ICD-10-CM

## 2017-07-05 DIAGNOSIS — E119 Type 2 diabetes mellitus without complications: Secondary | ICD-10-CM | POA: Diagnosis not present

## 2017-07-05 HISTORY — PX: IR RADIOLOGIST EVAL & MGMT: IMG5224

## 2017-07-05 MED ORDER — IOHEXOL 300 MG/ML  SOLN
100.0000 mL | Freq: Once | INTRAMUSCULAR | Status: AC | PRN
Start: 2017-07-05 — End: 2017-07-05
  Administered 2017-07-05: 100 mL via INTRAVENOUS

## 2017-07-05 NOTE — Progress Notes (Signed)
Referring Physician(s): Dr. Gershon Crane  Chief Complaint: The patient is seen in follow up today s/p intra-abdominal fluid collection s/p drain placement 05/26/17  History of present illness: Jesse Le is a 72 year old male with past medical history of CAD, DM, HTN who underwent low anterior resection for adenocarcinoma of the rectosigmoid junction and umbilical hernia repair 01/15/38 who developed a post-op fistula and was found to have pneumoperitoneum and a perihepatic fluid collection.  Patient underwent perihepatic drain placement 05/26/17 and was ultimately discharged to SNF with drain in place.  Patient presents to radiology clinic today unaccompanied by family or nursing home staff.  He is a poor historian and does not remember drain being flushed or how much output is recorded daily.  There is no additional information provided in his facility paperwork today.  He denies abdominal pain or tenderness to drain site.  He does not believe he is receiving any antibiotics although his PICC remains in place.   Past Medical History:  Diagnosis Date  . Aneurysm of aorta (HCC)   . Coronary artery disease   . Diabetes mellitus without complication ([place])    NEW ONSET 09/2012  . Hypertension   . Myocardial infarct [facility])     Past Surgical History:  Procedure Laterality Date  . ABDOMINAL AORTIC ANEURYSM REPAIR  10-14-11   TEVAR  . COLON RESECTION SIGMOID N/A 05/11/2017   Procedure: Colon Resection Sigmoid Low Anterior Resection with Staple Colon Relast;  Surgeon: Donnie Mesa, MD;  Location: [place];  Service: General;  Laterality: N/A;  . COLONOSCOPY WITH PROPOFOL N/A 05/08/2017   Procedure: COLONOSCOPY WITH PROPOFOL;  Surgeon: Jerene Bears, MD;  Location: [place];  Service: Gastroenterology;  Laterality: N/A;  . ILEOSTOMY N/A 05/11/2017   Procedure: Creation Vertical Loop ILEOSTOMY;  Surgeon: Donnie Mesa, MD;  Location: [place];  Service: General;  Laterality: N/A;  . INTRAVASCULAR ULTRASOUND   10/14/2011   Procedure: INTRAVASCULAR ULTRASOUND;  Surgeon: Serafina Mitchell, MD;  Location: [facility] OR;  Service: Vascular;  Laterality: N/A;  . IR RADIOLOGIST EVAL & MGMT  07/05/2017  . UMBILICAL HERNIA REPAIR N/A 05/11/2017   Procedure: HERNIA REPAIR UMBILICAL ADULT;  Surgeon: Donnie Mesa, MD;  Location: [place];  Service: General;  Laterality: N/A;  . VIDEO BRONCHOSCOPY  10/14/2011   Procedure: VIDEO BRONCHOSCOPY;  Surgeon: Serafina Mitchell, MD;  Location: [facility] OR;  Service: Vascular;  Laterality: N/A;    Allergies: Patient has no known allergies.  Medications: Prior to Admission medications   Medication Sig Start Date End Date Taking? Authorizing Provider  Amino Acids-Protein Hydrolys (FEEDING SUPPLEMENT, PRO-STAT SUGAR FREE 64,) LIQD Take 30 mLs by mouth 3 (three) times daily with meals. 07/10/2017   Charolette Forward, MD  aspirin EC 81 MG tablet Take 81 mg by mouth daily.    [provider]  famotidine (PEPCID) 40 MG tablet Take 1 tablet (40 mg total) by mouth daily. 06/22/17   Charolette Forward, MD  feeding supplement, ENSURE ENLIVE, (ENSURE ENLIVE) LIQD Take 237 mLs by mouth 2 (two) times daily between meals. 06/20/2017   Charolette Forward, MD  loperamide (IMODIUM) 2 MG capsule Take 2 capsules (4 mg total) by mouth 3 (three) times daily. 06/20/2017   Charolette Forward, MD  metoprolol tartrate (LOPRESSOR) 25 MG tablet Take 1 tablet (25 mg total) by mouth 2 (two) times daily. 06/27/2017   Charolette Forward, MD  piperacillin-tazobactam (ZOSYN) 3.375 GM/50ML IVPB Inject 50 mLs (3.375 g total) into the vein every 8 (eight)  hours. 06/28/2017   Charolette Forward, MD     No family history on file.  Social History   Socioeconomic History  . Marital status: Married    Spouse name: Not on file  . Number of children: Not on file  . Years of education: Not on file  . Highest education level: Not on file  Occupational History  . Not on file  Social Needs  . Financial resource strain: Not on file  . Food insecurity:     Worry: Not on file    Inability: Not on file  . Transportation needs:    Medical: Not on file    Non-medical: Not on file  Tobacco Use  . Smoking status: Former Smoker    Packs/day: 0.50    Types: Cigarettes    Last attempt to quit: 07/20/2011    Years since quitting: 5.9  . Smokeless tobacco: Never Used  Substance and Sexual Activity  . Alcohol use: Yes    Alcohol/week: 0.6 oz    Types: 1 Shots of liquor per week    Comment: weekends  . Drug use: No  . Sexual activity: Not on file  Lifestyle  . Physical activity:    Days per week: Not on file    Minutes per session: Not on file  . Stress: Not on file  Relationships  . Social connections:    Talks on phone: Not on file    Gets together: Not on file    Attends religious service: Not on file    Active member of club or organization: Not on file    Attends meetings of clubs or organizations: Not on file    Relationship status: Not on file  Other Topics Concern  . Not on file  Social History Narrative  . Not on file     Vital Signs: There were no vitals taken for this visit.  Physical Exam  NAD, alert, poor historian Abdomen:  Soft, non-distended.  RUQ drain in place with cloudy output. Insertion site c/d/i.   Imaging: Ir Radiologist Eval & Mgmt  Result Date: 07/05/2017 Please refer to notes tab for details about interventional procedure. (Op Note)   Labs:  CBC: Recent Labs    06/15/17 0333 06/17/17 0945 06/18/17 0650 06/20/17 0500  WBC 9.2 7.9 7.7 9.0  HGB 7.8* 9.1* 8.1* 8.5*  HCT 26.8* 31.4* 27.3* 28.5*  PLT 348 416* 360 397    COAGS: Recent Labs    05/19/17 0050 05/25/17 1638  INR 1.60 1.28    BMP: Recent Labs    06/15/17 0333 06/17/17 0945 06/18/17 0650 06/20/17 0500  NA 147* 147* 141 140  K 3.7 3.6 3.9 4.0  CL 120* 117* 114* 111  CO2 24 22 22 22   GLUCOSE 289* 158* 266* 79  BUN 16 14 14 11   CALCIUM 7.9* 8.1* 7.9* 8.1*  CREATININE 1.61* 1.53* 1.34* 1.25*  GFRNONAA 41* 44* 51* 56*    GFRAA 48* 51* 59* >60    LIVER FUNCTION TESTS: Recent Labs    06/06/17 0450 06/10/17 0500 06/15/17 0333 06/20/17 0500  BILITOT 1.1 1.8* 1.2 1.1  AST 38 35 30 54*  ALT 30 25 19 27   ALKPHOS 143* 128* 115 194*  PROT 5.5* 6.3* 6.4* 6.3*  ALBUMIN <1.0* 2.0* 2.0* 1.7*    Assessment: Perihepatic fluid collection s/p drain placement 05/26/17 Patient presents to radiology clinic today for follow-up of his peri-hepatic drain.  He cannot provide a detailed history, but denies abdominal pain or  irritation related to his drain.  He does not believe he is on antibiotics at present and his medication list from his facility confirms.   CT Abdomen/Pelvis today reviewed with Dr. Laurence Ferrari.  There is significant improvement in his intra-abdominal fluid collections.  Given his imaging and physical exam findings today, drain removed in its entirety without complication.  Dressing replaced and care instructions written on facility paperwork.   Signed: Docia Barrier, PA 07/05/2017, 3:59 PM   Please refer to Dr. Laurence Ferrari attestation of this note for management and plan.

## 2017-07-06 DIAGNOSIS — J449 Chronic obstructive pulmonary disease, unspecified: Secondary | ICD-10-CM | POA: Diagnosis not present

## 2017-07-06 DIAGNOSIS — I10 Essential (primary) hypertension: Secondary | ICD-10-CM | POA: Diagnosis not present

## 2017-07-06 DIAGNOSIS — C19 Malignant neoplasm of rectosigmoid junction: Secondary | ICD-10-CM | POA: Diagnosis not present

## 2017-07-06 DIAGNOSIS — E119 Type 2 diabetes mellitus without complications: Secondary | ICD-10-CM | POA: Diagnosis not present

## 2017-07-11 DIAGNOSIS — 419620001 Death: Secondary | SNOMED CT | POA: Diagnosis not present

## 2017-07-11 DEATH — deceased

## 2017-07-12 DIAGNOSIS — Z4801 Encounter for change or removal of surgical wound dressing: Secondary | ICD-10-CM | POA: Diagnosis not present

## 2017-07-12 DIAGNOSIS — Z85048 Personal history of other malignant neoplasm of rectum, rectosigmoid junction, and anus: Secondary | ICD-10-CM | POA: Diagnosis not present

## 2017-07-12 DIAGNOSIS — I480 Paroxysmal atrial fibrillation: Secondary | ICD-10-CM | POA: Diagnosis not present

## 2017-07-12 DIAGNOSIS — L89153 Pressure ulcer of sacral region, stage 3: Secondary | ICD-10-CM | POA: Diagnosis not present

## 2017-07-13 DIAGNOSIS — D500 Iron deficiency anemia secondary to blood loss (chronic): Secondary | ICD-10-CM | POA: Diagnosis not present

## 2017-07-13 DIAGNOSIS — J449 Chronic obstructive pulmonary disease, unspecified: Secondary | ICD-10-CM | POA: Diagnosis not present

## 2017-07-13 DIAGNOSIS — E119 Type 2 diabetes mellitus without complications: Secondary | ICD-10-CM | POA: Diagnosis not present

## 2017-07-13 DIAGNOSIS — C19 Malignant neoplasm of rectosigmoid junction: Secondary | ICD-10-CM | POA: Diagnosis not present

## 2017-07-15 DIAGNOSIS — E46 Unspecified protein-calorie malnutrition: Secondary | ICD-10-CM | POA: Diagnosis not present

## 2017-07-15 DIAGNOSIS — C19 Malignant neoplasm of rectosigmoid junction: Secondary | ICD-10-CM | POA: Diagnosis not present

## 2017-07-15 DIAGNOSIS — N189 Chronic kidney disease, unspecified: Secondary | ICD-10-CM | POA: Diagnosis not present

## 2017-07-15 DIAGNOSIS — I10 Essential (primary) hypertension: Secondary | ICD-10-CM | POA: Diagnosis not present

## 2017-07-15 DIAGNOSIS — E119 Type 2 diabetes mellitus without complications: Secondary | ICD-10-CM | POA: Diagnosis not present

## 2017-07-19 DIAGNOSIS — I480 Paroxysmal atrial fibrillation: Secondary | ICD-10-CM | POA: Diagnosis not present

## 2017-07-19 DIAGNOSIS — L89153 Pressure ulcer of sacral region, stage 3: Secondary | ICD-10-CM | POA: Diagnosis not present

## 2017-07-19 DIAGNOSIS — Z85048 Personal history of other malignant neoplasm of rectum, rectosigmoid junction, and anus: Secondary | ICD-10-CM | POA: Diagnosis not present

## 2017-07-19 DIAGNOSIS — Z4801 Encounter for change or removal of surgical wound dressing: Secondary | ICD-10-CM | POA: Diagnosis not present

## 2017-07-20 ENCOUNTER — Telehealth: Payer: Self-pay | Admitting: Hematology

## 2017-07-20 DIAGNOSIS — C19 Malignant neoplasm of rectosigmoid junction: Secondary | ICD-10-CM | POA: Diagnosis not present

## 2017-07-20 DIAGNOSIS — J449 Chronic obstructive pulmonary disease, unspecified: Secondary | ICD-10-CM | POA: Diagnosis not present

## 2017-07-20 DIAGNOSIS — E119 Type 2 diabetes mellitus without complications: Secondary | ICD-10-CM | POA: Diagnosis not present

## 2017-07-20 DIAGNOSIS — I10 Essential (primary) hypertension: Secondary | ICD-10-CM | POA: Diagnosis not present

## 2017-07-20 NOTE — Telephone Encounter (Signed)
lft the pt a voicemail to schedule an appointment

## 2017-07-22 DIAGNOSIS — K219 Gastro-esophageal reflux disease without esophagitis: Secondary | ICD-10-CM | POA: Diagnosis not present

## 2017-07-22 DIAGNOSIS — E46 Unspecified protein-calorie malnutrition: Secondary | ICD-10-CM | POA: Diagnosis not present

## 2017-07-22 DIAGNOSIS — I10 Essential (primary) hypertension: Secondary | ICD-10-CM | POA: Diagnosis not present

## 2017-07-22 DIAGNOSIS — J449 Chronic obstructive pulmonary disease, unspecified: Secondary | ICD-10-CM | POA: Diagnosis not present

## 2017-07-22 NOTE — Progress Notes (Signed)
Called patient who was unavailable but was able to speak with Jesse Le, his SO. I explained that Dr. Kathi Ludwig has sent a referral for a medical oncologist to see patient.  I provided patient with Wilhelmina Mcardle phone number and explained that she was the new patient scheduler. Jesse Le agreed to do so this afternoon.

## 2017-07-25 ENCOUNTER — Telehealth: Payer: Self-pay | Admitting: Oncology

## 2017-07-25 NOTE — Telephone Encounter (Signed)
Spoke to the pt's significant other [name], and scheduled the pt to see Dr. Benay Spice on 7/29 at 2pm. Ms. [name] states the pt lives in a facility. I explained that Mr. Burstein would need to arrive 30 minutes early. She voiced understanding.

## 2017-07-26 DIAGNOSIS — Z4801 Encounter for change or removal of surgical wound dressing: Secondary | ICD-10-CM | POA: Diagnosis not present

## 2017-07-26 DIAGNOSIS — E119 Type 2 diabetes mellitus without complications: Secondary | ICD-10-CM | POA: Diagnosis not present

## 2017-07-26 DIAGNOSIS — Z85048 Personal history of other malignant neoplasm of rectum, rectosigmoid junction, and anus: Secondary | ICD-10-CM | POA: Diagnosis not present

## 2017-07-26 DIAGNOSIS — L89153 Pressure ulcer of sacral region, stage 3: Secondary | ICD-10-CM | POA: Diagnosis not present

## 2017-08-02 DIAGNOSIS — E119 Type 2 diabetes mellitus without complications: Secondary | ICD-10-CM | POA: Diagnosis not present

## 2017-08-02 DIAGNOSIS — Z4801 Encounter for change or removal of surgical wound dressing: Secondary | ICD-10-CM | POA: Diagnosis not present

## 2017-08-02 DIAGNOSIS — Z85048 Personal history of other malignant neoplasm of rectum, rectosigmoid junction, and anus: Secondary | ICD-10-CM | POA: Diagnosis not present

## 2017-08-02 DIAGNOSIS — L89153 Pressure ulcer of sacral region, stage 3: Secondary | ICD-10-CM | POA: Diagnosis not present

## 2017-08-08 ENCOUNTER — Telehealth: Payer: Self-pay

## 2017-08-08 ENCOUNTER — Inpatient Hospital Stay: Payer: Medicare Other | Attending: Oncology | Admitting: Oncology

## 2017-08-08 VITALS — BP 123/84 | HR 107 | Temp 98.0°F | Resp 17 | Ht 70.0 in | Wt 128.1 lb

## 2017-08-08 DIAGNOSIS — C187 Malignant neoplasm of sigmoid colon: Secondary | ICD-10-CM | POA: Diagnosis not present

## 2017-08-08 NOTE — Progress Notes (Signed)
[facility] Patient Consult   Requesting MD: Ricci Dirocco 72 y.o.  [birth year]    Reason for Consult: Colon cancer   HPI: Mr. Struble was transported here from a nursing facility.  He is alone.  He is unable to provide history.  The history is from the available electronic medical record.  Mr. Copley was admitted in April with abdominal pain.  He was found to have iron deficiency anemia.  A CT on 05/06/2017 revealed a 4 mm nodule in the left lower lobe.  A mass was noted to involve the sigmoid colon.  No adenopathy.  A 2.3 similar low-density lesion at the lower pole the right kidney has enlarged compared to CTs from 2013.  Dr. Hilarie Fredrickson was consulted and he was taken to a colonoscopy procedure on 05/08/2017.  A partially obstructing mass was found in the rectosigmoid colon and distal sigmoid colon.  The mass was biopsied and tattooed.  The pathology revealed invasive adenocarcinoma.  Dr. Georgette Dover was consulted and Mr. Schueler was taken to the operating room for a low anterior resection and diverting loop ileostomy on 05/11/2017.  A mass was palpated in the distal sigmoid colon, adherent to the posterior bladder.  The tumor was resected with the anastomosis at 6 cm from the anal verge.  There was an air leak at the anastomosis.  A diverting ileostomy was created.  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  Tumor extended into pericolonic connective tissue and subserosal connective tissue.  The resection margins are negative.  There were 15- lymph nodes.  No lymphovascular invasion.  Perineural invasion was noted.  No tumor deposits.  The tumor returned MSI stable with no loss of mismatch repair protein expression.  The postoperative course was complicated by wound dehiscence, high output ileostomy, and Pseudomonas infection.  He developed respiratory failure and pneumonia requiring intubation.  A wound VAC  was placed.  He was discharged to a skilled nursing facility 06/12/2017.  A wound VAC remains in place.  He was seen by interventional radiology 07/05/2017.  A peritoneal drainage catheter was removed.  A CT the abdomen and pelvis 07/05/2017 revealed near total resolution of a perihepatic fluid and gas collection and improvement of other non-drain fluid collections.        Past Medical History:  Diagnosis Date  . Aneurysm of aorta (HCC)   . Coronary artery disease   . Diabetes mellitus without complication ([place])    NEW ONSET 09/2012  . Hypertension   . Myocardial infarct ([place])     .  Rectosigmoid cancer, stage II (T3N0), status post a low anterior resection 05/11/2017  Past Surgical History:  Procedure Laterality Date  . ABDOMINAL AORTIC ANEURYSM REPAIR  10-14-11   TEVAR  . COLON RESECTION SIGMOID N/A 05/11/2017   Procedure: Colon Resection Sigmoid Low Anterior Resection with Staple Colon Relast;  Surgeon: Donnie Mesa, MD;  Location: [place];  Service: General;  Laterality: N/A;  . COLONOSCOPY WITH PROPOFOL N/A 05/08/2017   Procedure: COLONOSCOPY WITH PROPOFOL;  Surgeon: Jerene Bears, MD;  Location: [place];  Service: Gastroenterology;  Laterality: N/A;  . ILEOSTOMY N/A 05/11/2017   Procedure: Creation Vertical Loop ILEOSTOMY;  Surgeon: Donnie Mesa, MD;  Location: [place];  Service: General;  Laterality: N/A;  . INTRAVASCULAR ULTRASOUND  10/14/2011   Procedure: INTRAVASCULAR ULTRASOUND;  Surgeon: Serafina Mitchell, MD;  Location: [place];  Service: Vascular;  Laterality: N/A;  . IR  RADIOLOGIST EVAL & MGMT  07/05/2017  . UMBILICAL HERNIA REPAIR N/A 05/11/2017   Procedure: HERNIA REPAIR UMBILICAL ADULT;  Surgeon: Donnie Mesa, MD;  Location: [place];  Service: General;  Laterality: N/A;  . VIDEO BRONCHOSCOPY  10/14/2011   Procedure: VIDEO BRONCHOSCOPY;  Surgeon: Serafina Mitchell, MD;  Location: [facility] OR;  Service: Vascular;  Laterality: N/A;    Medications: Reviewed  Allergies: No Known  Allergies  Family history: He reports his mother had "cancer "  Social History:   He reports living with a girlfriend.  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  He smokes cigarettes and used alcohol in the past.  No risk factor for HIV or hepatitis.  ROS:   Positives include: Bilateral foot soreness A complete ROS was otherwise negative.  Physical Exam:  Blood pressure 123/84, pulse (!) 107, temperature 98 F (36.7 C), temperature source Oral, resp. rate 17, height '5\' 10"'$  (1.778 m), weight 128 lb 1.6 oz (58.1 kg), SpO2 100 %.  HEENT: Oropharynx without visible mass, neck without mass Lungs: Distant breath sounds, no respiratory distress Cardiac: Regular rate and rhythm Abdomen: No hepatosplenomegaly, midline wound VAC in place  Vascular: No leg edema Lymph nodes firm 1 cm right submandibular node, no cervical, supraclavicular, axillary, or inguinal nodes Neurologic: Alert, follows commands, the motor exam appears intact in the upper and lower extremities.  Not oriented to place, year, or diagnosis Skin: Ecchymosis at the left lower abdominal wall, superficial skin tear at the right low abdominal wall Musculoskeletal: No spine tenderness   LAB:  CBC  Lab Results  Component Value Date   WBC 9.0 06/20/2017   HGB 8.5 (L) 06/20/2017   HCT 28.5 (L) 06/20/2017   MCV 93.4 06/20/2017   PLT 397 06/20/2017   NEUTROABS 23.5 (H) 05/30/2017        CMP  Lab Results  Component Value Date   NA 140 06/20/2017   K 4.0 06/20/2017   CL 111 06/20/2017   CO2 22 06/20/2017   GLUCOSE 79 06/20/2017   BUN 11 06/20/2017   CREATININE 1.25 (H) 06/20/2017   CALCIUM 8.1 (L) 06/20/2017   PROT 6.3 (L) 06/20/2017   ALBUMIN 1.7 (L) 06/20/2017   AST 54 (H) 06/20/2017   ALT 27 06/20/2017   ALKPHOS 194 (H) 06/20/2017   BILITOT 1.1 06/20/2017   GFRNONAA 56 (L) 06/20/2017   GFRAA >60 06/20/2017     Lab Results  Component Value Date   CEA1 24.6 (H) 05/08/2017    Imaging: CT  images from 05/06/2017-reviewed     Assessment/Plan:   1. Colon cancer, stage II (T3N0), status post a low anterior resection 05/11/2017  0/15 lymph nodes positive, no lymphovascular invasion, positive perineural invasion  MSI stable, no loss of mismatch repair protein expression  CT 05/06/2017- sigmoid colon mass, 2.3 cm lower pole right kidney lesion-increased from 2013, 4 mm left lower lobe nodule  Colonoscopy 05/08/2017- mass in the rectosigmoid colon  2. History of iron deficiency anemia secondary to #1 3. Postoperative wound/abdominal infection May 2019  Wound VAC remains in place 4. History of a thoracic aortic aneurysm dissection, status post endovascular graft 5. Diabetes 6. COPD 7. Chronic renal insufficiency 8.   Pneumonia/respiratory failure requiring intubation May 2019 9.   Malnutrition/weight loss 10.  Left lower lobe nodule on CT 05/06/2017 11.  Right renal lesion- felt to be a complex cyst on CT 05/25/2017 12.  Altered mental status-dementia? 13.  Firm right submandibular gland versus lymph  node on exam 08/08/2017  Disposition:   Mr. Dethloff was diagnosed with a sigmoid colon cancer in April 2019 and underwent surgical resection 05/11/2017.  He had a complicated postoperative course.  A wound VAC remains in place.  He resides in a nursing facility.  Mr. Hulse was diagnosed with stage II colon cancer.  He has a good prognosis for a long-term disease-free survival.  I do not recommend adjuvant therapy in his case.  I will present his case at the GI tumor conference to be sure this was a sigmoid colon cancer as opposed to a rectal tumor.  He has a poor understanding of the diagnosis.  He may have underlying dementia.  He has multiple comorbid conditions.  I will defer surveillance of the lung nodule, evaluation of the right submandibular node, and repeat colonoscopy to Dr. Terrence Dupont.  Mr. Saraceno declined a follow-up CEA today.  He is not scheduled for a follow-up appointment at  the Cancer center.  I am available to see him in the future as needed.  Betsy Coder, MD  08/08/2017, 3:27 PM

## 2017-08-08 NOTE — Progress Notes (Signed)
[place] to confirm that patient has transportation to his cnitial consult with Dr. Benay Spice today at 2 PM. I spoke with Fraser Din in transportation and she understands that patient is to arrive at 1:30 PM.

## 2017-08-08 NOTE — Telephone Encounter (Signed)
Upon assessment pt disoriented to year, location, events. Pt unaware of a surgical procedure that he had 3 months ago. Upon safety assessment pt asked 'Do you currently feel unsafe at home or in any of your relationships?'. Pt states "No, not all the time". Upon further assessment pt unable to verbalize incident or reasoning as to why he felt threatened or unsafe at times. Spoke with [place], significant other. [name] confirms that pt has "been confused like that for a while. Sometimes he knows who I am, sometimes he doesn't ".  Also questioned significant other if she is aware of recent weight loss, she states "he has lost a lot of weight, he hasn't been eating".This RN voiced understanding, MD aware.

## 2017-08-09 DIAGNOSIS — Z4801 Encounter for change or removal of surgical wound dressing: Secondary | ICD-10-CM | POA: Diagnosis not present

## 2017-08-09 DIAGNOSIS — E119 Type 2 diabetes mellitus without complications: Secondary | ICD-10-CM | POA: Diagnosis not present

## 2017-08-09 DIAGNOSIS — Z85048 Personal history of other malignant neoplasm of rectum, rectosigmoid junction, and anus: Secondary | ICD-10-CM | POA: Diagnosis not present

## 2017-08-09 DIAGNOSIS — L89153 Pressure ulcer of sacral region, stage 3: Secondary | ICD-10-CM | POA: Diagnosis not present

## 2017-09-11 DEATH — deceased

## 2019-02-25 IMAGING — CT CT ABD-PELV W/O CM
2 of 4 series · 16 of 46 positions shown, 18 images · non-contrast
Comparison: 12/26/2012

CLINICAL DATA: Uncontrolled fistula.

EXAM:
CT ABDOMEN AND PELVIS WITHOUT CONTRAST
TECHNIQUE: Multidetector CT imaging of the abdomen and pelvis was performed
following the standard protocol without IV contrast.

[Series 3: a/p w/o 5mm · axial · non-contrast · 0.83mm/px · z∈[+1072,+1522]mm · 13 of 102 slices shown, 15 images]
[im 8/102  soft-tissue]
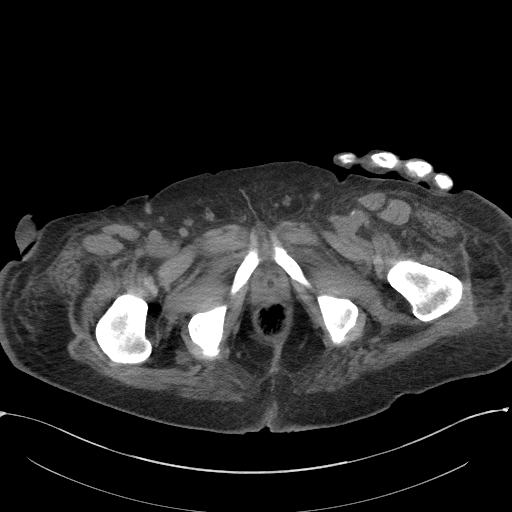
[im 8/102  bone]
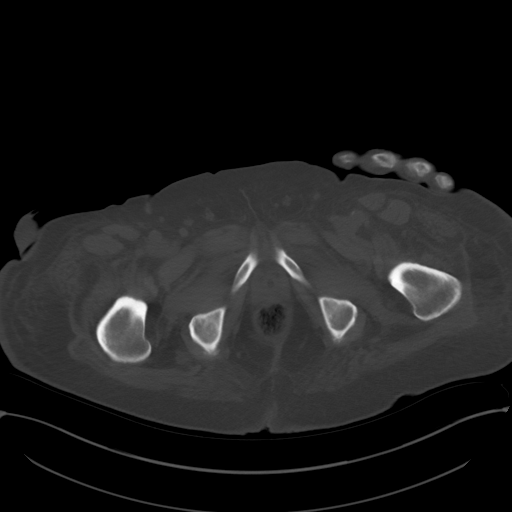
[im 15/102  soft-tissue]
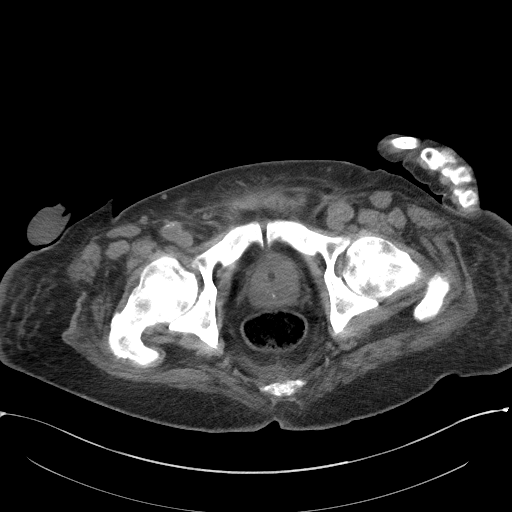
[im 23/102  soft-tissue]
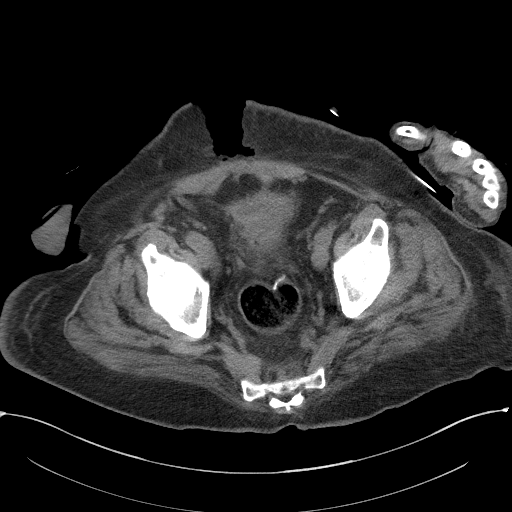
[im 30/102  soft-tissue]
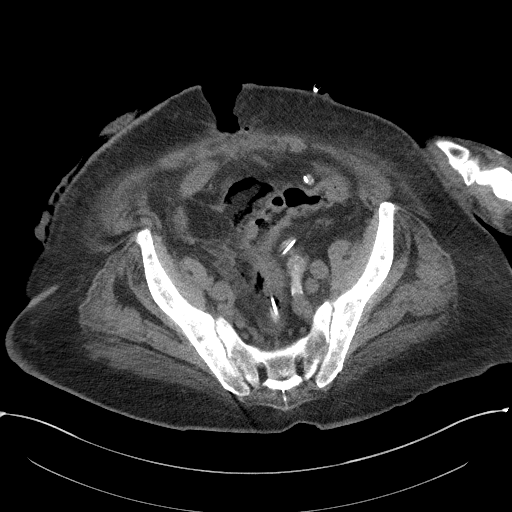
[im 38/102  soft-tissue]
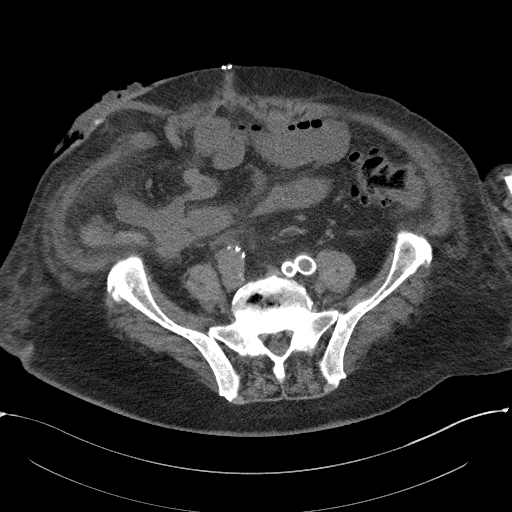
[im 45/102  soft-tissue]
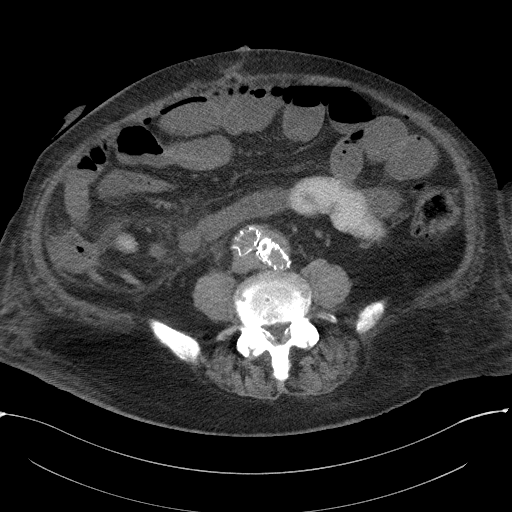
[im 53/102  soft-tissue]
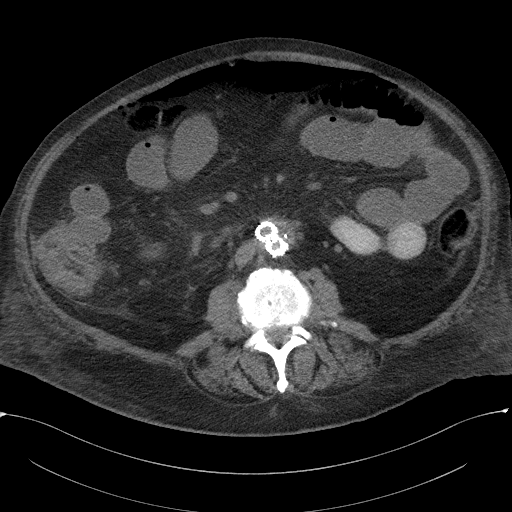
[im 60/102  soft-tissue]
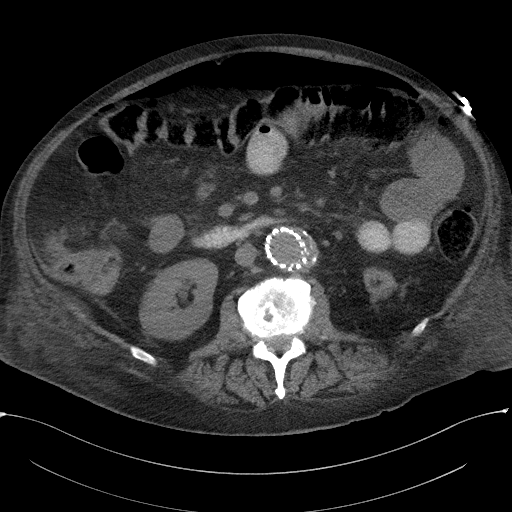
[im 68/102  soft-tissue]
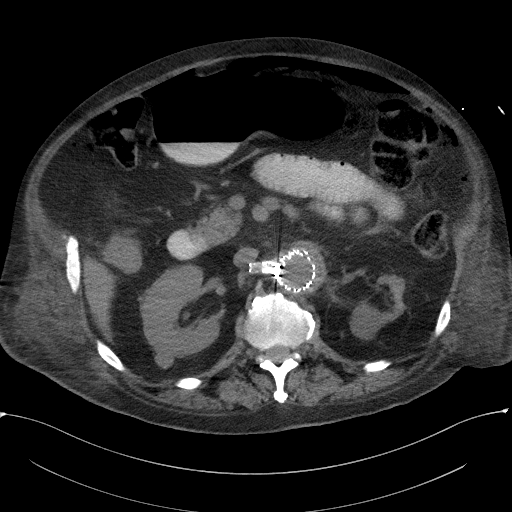
[im 68/102  bone]
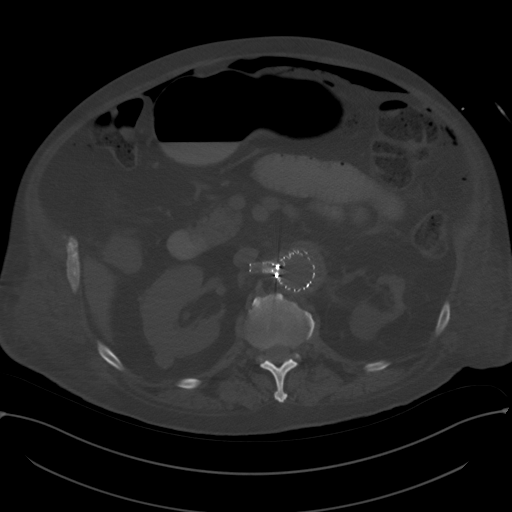
[im 75/102  soft-tissue]
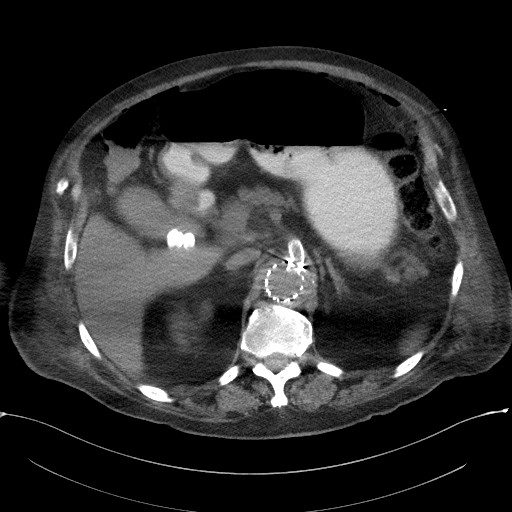
[im 83/102  soft-tissue]
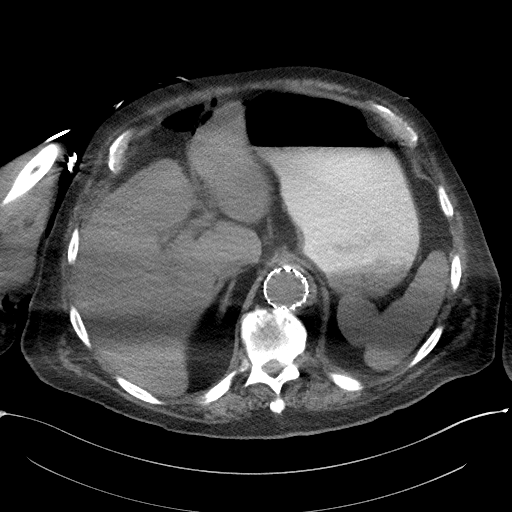
[im 90/102  soft-tissue]
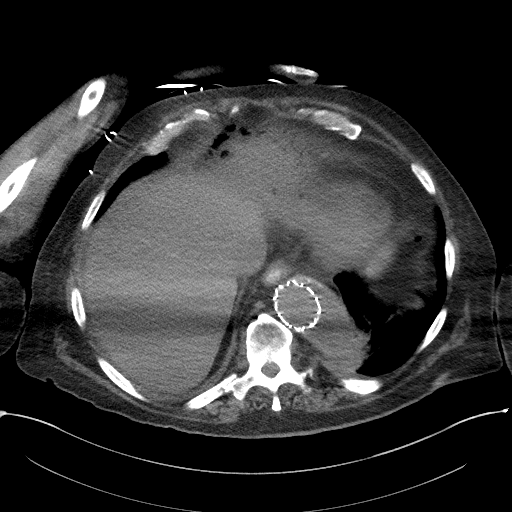
[im 98/102  soft-tissue]
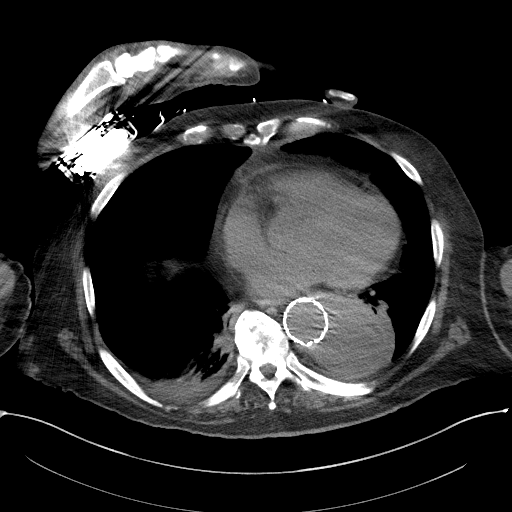

[Series 6: a/p w/o cor · coronal · non-contrast · 0.87mm/px · 3 of 164 slices shown]
[im 55/164  soft-tissue]
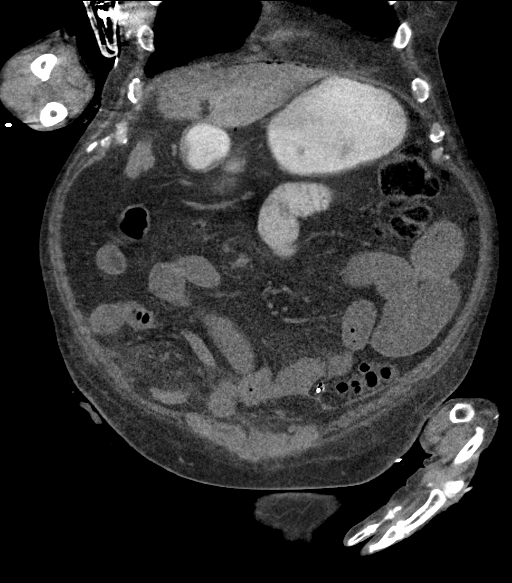
[im 73/164  soft-tissue]
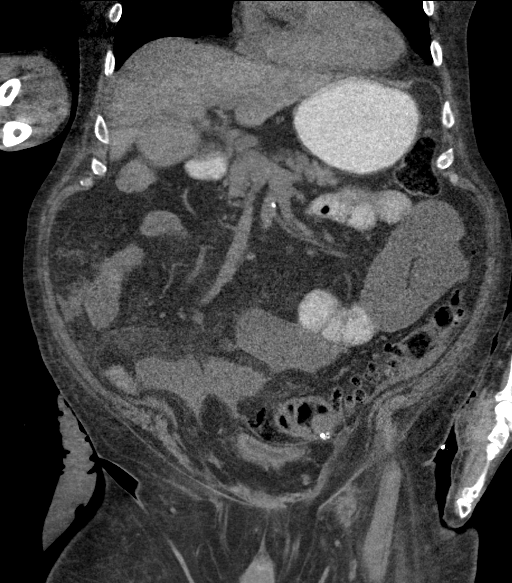
[im 91/164  soft-tissue]
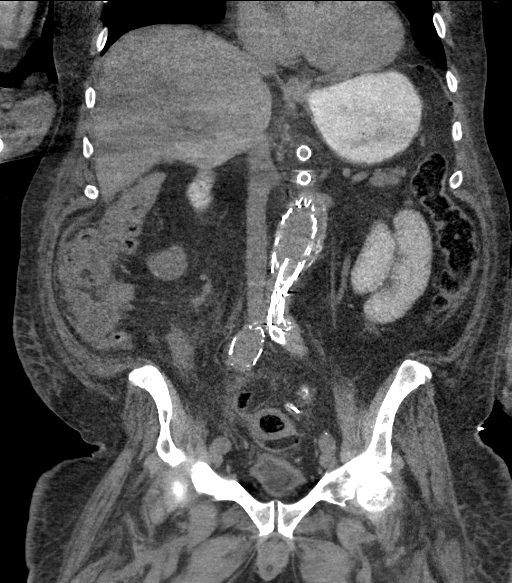

[16 of 46 positions shown; findings below may reference images not displayed]

FINDINGS: Lower chest: Stented thoracic aortic aneurysm. Lower lobe
atelectasis.

Hepatobiliary: No focal liver abnormality.Cholelithiasis. No signs
of cholecystitis.

Pancreas: Unremarkable.

Spleen: Unremarkable.

Adrenals/Urinary Tract: Negative adrenals. Severe left renal
atrophy, likely from old vascular injury. Left renal cystic density.
Right renal isodense but stable from 6002 lesion compatible with
cyst. The bladder is decompressed by Foley catheter.

Stomach/Bowel: Status post low anterior resection. There is a
visible discontinuity in the posterior wall just above the
anastomosis with feculent appearing material and gas in the pelvis,
measuring up to 5x 3 cm anterior and superior to the sigmoid colon.
Oral contrast has reached the proximal small bowel only. Mild
generalized fluid filling of small bowel. No obstructive process is
seen. Negative appendix. Stranding around the ascending colon is
likely postoperative. Fluid collection right lower quadrant
measuring up to 6.4 x 3.5 cm. Moderate pneumoperitoneum.

Vascular/Lymphatic: Aortoiliac stent grafting with multiple visceral
stents. No mass or adenopathy.

Reproductive:Negative

Other: Low abdominal wall incision is open and packed. No
undermining or collection.

Musculoskeletal: Extensive degenerative changes. Multilevel bridging
osteophyte. No acute osseous finding.

A call is being placed to the ordering provider.
IMPRESSION: 1. Visible 6 mm defect in the posterior colon wall just above the
colocolonic anastomosis with feculent material tracking superiorly
and anteriorly. The largest gas and feculent collection measures 5 x
3 cm superior to the sigmoid colon. The surgical drain is in
continuity with the collection.
2. Likely contiguous 6 x 4 cm fluid collection in the right lower
quadrant with mesenteric fat edema.
3. Moderate pneumoperitoneum.
4. Oral contrast only reached the proximal jejunum. Probable mild
ileus.
5. Open low abdominal wall with packing. No undermining or
subcutaneous collection.
6. Cholelithiasis
